# Patient Record
Sex: Male | Born: 1965 | Race: White | Hispanic: No | Smoking: Current every day smoker
Health system: Southern US, Community
[De-identification: ages and names within clinical notes are randomized; demographics above are authoritative.]

## PROBLEM LIST (undated history)

## (undated) DIAGNOSIS — I10 Essential (primary) hypertension: Secondary | ICD-10-CM

## (undated) DIAGNOSIS — F329 Major depressive disorder, single episode, unspecified: Secondary | ICD-10-CM

## (undated) DIAGNOSIS — I4891 Unspecified atrial fibrillation: Secondary | ICD-10-CM

## (undated) DIAGNOSIS — F32A Depression, unspecified: Secondary | ICD-10-CM

## (undated) HISTORY — DX: Essential (primary) hypertension: I10

## (undated) HISTORY — DX: Depression, unspecified: F32.A

## (undated) HISTORY — DX: Major depressive disorder, single episode, unspecified: F32.9

---

## 2004-06-06 ENCOUNTER — Emergency Department: Payer: Self-pay | Admitting: Emergency Medicine

## 2009-11-20 ENCOUNTER — Emergency Department: Payer: Self-pay | Admitting: Emergency Medicine

## 2012-12-22 ENCOUNTER — Ambulatory Visit: Payer: Self-pay | Admitting: Gastroenterology

## 2012-12-25 LAB — PATHOLOGY REPORT

## 2014-08-08 ENCOUNTER — Ambulatory Visit: Payer: Self-pay | Admitting: Family Medicine

## 2015-05-16 ENCOUNTER — Ambulatory Visit (INDEPENDENT_AMBULATORY_CARE_PROVIDER_SITE_OTHER): Payer: Self-pay | Admitting: Family Medicine

## 2015-05-16 ENCOUNTER — Encounter: Payer: Self-pay | Admitting: Family Medicine

## 2015-05-16 VITALS — BP 125/80 | HR 69 | Temp 98.2°F | Ht 68.7 in | Wt 240.8 lb

## 2015-05-16 DIAGNOSIS — R1032 Left lower quadrant pain: Secondary | ICD-10-CM | POA: Insufficient documentation

## 2015-05-16 DIAGNOSIS — R002 Palpitations: Secondary | ICD-10-CM | POA: Insufficient documentation

## 2015-05-16 DIAGNOSIS — F4001 Agoraphobia with panic disorder: Secondary | ICD-10-CM | POA: Insufficient documentation

## 2015-05-16 MED ORDER — LOSARTAN POTASSIUM-HCTZ 50-12.5 MG PO TABS: 1.0000 | ORAL_TABLET | Freq: Every day | ORAL | Status: DC

## 2015-05-16 MED ORDER — SERTRALINE HCL 50 MG PO TABS: 50.0000 mg | ORAL_TABLET | Freq: Every day | ORAL | Status: DC

## 2015-05-16 NOTE — Patient Instructions (Addendum)
We'll get records from Dr. Lady Gary and Dr. Burnett Sheng (everything related to your heart) We'll get labs today If you have not heard anything from my staff in a week about any orders/referrals/studies from today, please contact us here to follow-up [phone]  Consider calling [facility] to see if you qualify [facility] Address: [address], [place], [place] [address]  Phone: [phone]  Start the new sertraline Call me with any problems Return in 4 weeks (or see someone in 4 weeks for follow-up on mood) Do seek help if needed You may be having PACs or PVCs if you want to read about it Try to quit smoking Avoid caffeine and tea (theophylline) and chocolate   Palpitations A palpitation is the feeling that your heartbeat is irregular or is faster than normal. It may feel like your heart is fluttering or skipping a beat. Palpitations are usually not a serious problem. However, in some cases, you may need further medical evaluation. CAUSES  Palpitations can be caused by:  Smoking.  Caffeine or other stimulants, such as diet pills or energy drinks.  Alcohol.  Stress and anxiety.  Strenuous physical activity.  Fatigue.  Certain medicines.  Heart disease, especially if you have a history of irregular heart rhythms (arrhythmias), such as atrial fibrillation, atrial flutter, or supraventricular tachycardia.  An improperly working pacemaker or defibrillator. DIAGNOSIS  To find the cause of your palpitations, your health care provider will take your medical history and perform a physical exam. Your health care provider may also have you take a test called an ambulatory electrocardiogram (ECG). An ECG records your heartbeat patterns over a 24-hour period. You may also have other tests, such as:  Transthoracic echocardiogram (TTE). During echocardiography, sound waves are used to evaluate how blood flows through your heart.  Transesophageal echocardiogram (TEE).  Cardiac  monitoring. This allows your health care provider to monitor your heart rate and rhythm in real time.  Holter monitor. This is a portable device that records your heartbeat and can help diagnose heart arrhythmias. It allows your health care provider to track your heart activity for several days, if needed.  Stress tests by exercise or by giving medicine that makes the heart beat faster. TREATMENT  Treatment of palpitations depends on the cause of your symptoms and can vary greatly. Most cases of palpitations do not require any treatment other than time, relaxation, and monitoring your symptoms. Other causes, such as atrial fibrillation, atrial flutter, or supraventricular tachycardia, usually require further treatment. HOME CARE INSTRUCTIONS   Avoid:  Caffeinated coffee, tea, soft drinks, diet pills, and energy drinks.  Chocolate.  Alcohol.  Stop smoking if you smoke.  Reduce your stress and anxiety. Things that can help you relax include:  A method of controlling things in your body, such as your heartbeats, with your mind (biofeedback).  Yoga.  Meditation.  Physical activity such as swimming, jogging, or walking.  Get plenty of rest and sleep. SEEK MEDICAL CARE IF:   You continue to have a fast or irregular heartbeat beyond 24 hours.  Your palpitations occur more often. SEEK IMMEDIATE MEDICAL CARE IF:  You have chest pain or shortness of breath.  You have a severe headache.  You feel dizzy or you faint. MAKE SURE YOU:  Understand these instructions.  Will watch your condition.  Will get help right away if you are not doing well or get worse. Document Released: 08/13/2000 Document Revised: 08/21/2013 Document Reviewed: 10/15/2011 Exit[employer] Patient Information 2015 [employer]. This information is not intended to replace advice given to you by your health care provider. Make sure you discuss any questions you have with your health care provider. Panic  Attacks Panic attacks are sudden, short-livedsurges of severe anxiety, fear, or discomfort. They may occur for no reason when you are relaxed, when you are anxious, or when you are sleeping. Panic attacks may occur for a number of reasons:   Healthy people occasionally have panic attacks in extreme, life-threatening situations, such as war or natural disasters. Normal anxiety is a protective mechanism of the body that helps [place] react to danger (fight or flight response).  Panic attacks are often seen with anxiety disorders, such as panic disorder, social anxiety disorder, generalized anxiety disorder, and phobias. Anxiety disorders cause excessive or uncontrollable anxiety. They may interfere with your relationships or other life activities.  Panic attacks are sometimes seen with other mental illnesses, such as depression and posttraumatic stress disorder.  Certain medical conditions, prescription medicines, and drugs of abuse can cause panic attacks. SYMPTOMS  Panic attacks start suddenly, peak within 20 minutes, and are accompanied by four or more of the following symptoms:  Pounding heart or fast heart rate (palpitations).  Sweating.  Trembling or shaking.  Shortness of breath or feeling smothered.  Feeling choked.  Chest pain or discomfort.  Nausea or strange feeling in your stomach.  Dizziness, light-headedness, or feeling like you will faint.  Chills or hot flushes.  Numbness or tingling in your lips or hands and feet.  Feeling that things are not real or feeling that you are not yourself.  Fear of losing control or going crazy.  Fear of dying. Some of these symptoms can mimic serious medical conditions. For example, you may think you are having a heart attack. Although panic attacks can be very scary, they are not life threatening. DIAGNOSIS  Panic attacks are diagnosed through an assessment by your health care provider. Your health care provider will ask questions  about your symptoms, such as where and when they occurred. Your health care provider will also ask about your medical history and use of alcohol and drugs, including prescription medicines. Your health care provider may order blood tests or other studies to rule out a serious medical condition. Your health care provider may refer you to a mental health professional for further evaluation. TREATMENT   Most healthy people who have one or two panic attacks in an extreme, life-threatening situation will not require treatment.  The treatment for panic attacks associated with anxiety disorders or other mental illness typically involves counseling with a mental health professional, medicine, or a combination of both. Your health care provider will help determine what treatment is best for you.  Panic attacks due to physical illness usually go away with treatment of the illness. If prescription medicine is causing panic attacks, talk with your health care provider about stopping the medicine, decreasing the dose, or substituting another medicine.  Panic attacks due to alcohol or drug abuse go away with abstinence. Some adults need professional help in order to stop drinking or using drugs. HOME CARE INSTRUCTIONS   Take all medicines as directed by your health care provider.   Schedule and attend follow-up visits as directed by your health care provider. It is important to keep all your appointments. SEEK MEDICAL CARE IF:  You are not able to take your medicines as prescribed.  Your symptoms do not improve or get worse. SEEK IMMEDIATE MEDICAL CARE IF:  You experience panic attack symptoms that are different than your usual symptoms.  You have serious thoughts about hurting yourself or others.  You are taking medicine for panic attacks and have a serious side effect. MAKE SURE YOU:  Understand these instructions.  Will watch your condition.  Will get help right away if you are not doing well  or get worse. Document Released: 08/16/2005 Document Revised: 08/21/2013 Document Reviewed: 03/30/2013 [facility] Patient Information 2015 [place], [place]. This information is not intended to replace advice given to you by your health care provider. Make sure you discuss any questions you have with your health care provider.
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
## 2015-05-17 LAB — COMPREHENSIVE METABOLIC PANEL
ALBUMIN: 4 g/dL (ref 3.5–5.5)
ALT: 45 IU/L — ABNORMAL HIGH (ref 0–44)
AST: 24 IU/L (ref 0–40)
Albumin/Globulin Ratio: 1.7 (ref 1.1–2.5)
Alkaline Phosphatase: 87 IU/L (ref 39–117)
BUN / CREAT RATIO: 14 (ref 9–20)
BUN: 16 mg/dL (ref 6–24)
CHLORIDE: 103 mmol/L (ref 97–108)
CO2: 25 mmol/L (ref 18–29)
Calcium: 9.9 mg/dL (ref 8.7–10.2)
Creatinine, Ser: 1.12 mg/dL (ref 0.76–1.27)
GFR calc non Af Amer: 77 mL/min/{1.73_m2} (ref >59)
GFR, EST AFRICAN AMERICAN: 89 mL/min/{1.73_m2} (ref >59)
GLOBULIN, TOTAL: 2.4 g/dL (ref 1.5–4.5)
Glucose: 82 mg/dL (ref 65–99)
Potassium: 4.1 mmol/L (ref 3.5–5.2)
SODIUM: 143 mmol/L (ref 134–144)
TOTAL PROTEIN: 6.4 g/dL (ref 6.0–8.5)

## 2015-05-17 LAB — MAGNESIUM: Magnesium: 1.9 mg/dL (ref 1.6–2.3)

## 2015-05-17 LAB — TSH: TSH: 1.32 u[IU]/mL (ref 0.450–4.500)

## 2015-05-19 ENCOUNTER — Encounter: Payer: Self-pay | Admitting: Family Medicine

## 2015-05-21 NOTE — Assessment & Plan Note (Signed)
Check TSH to r/o hyperthyroidism; want to also r/o carcinoid, but patient will check first about getting insurance or other coverage before additional tests are run; start medication, follow-up in 3-4 weeks, but call sooner or come in if needed; he is not interested in counseling

## 2015-05-21 NOTE — Assessment & Plan Note (Signed)
Request records from cardiology work-up; it sounds as though he has had extensive testing already including holter monitor, echo, ekg; I would like to suggest a low-dose beta-blocker but want cardiology notes first; patient to call as default in case notes don't arrive quickly; check K+ and Mg2+; stop smoking; limit caffeine

## 2015-05-21 NOTE — Assessment & Plan Note (Signed)
Suggested getting a CT scan, but patient does not have insurance; ddx includes (rare possibility) of carcinoid tumor; he will check into insurance or other coverage options, but I do think he needs this looked into

## 2015-06-13 ENCOUNTER — Other Ambulatory Visit: Payer: Self-pay

## 2015-06-13 ENCOUNTER — Encounter: Payer: Self-pay | Admitting: Family Medicine

## 2015-06-13 ENCOUNTER — Ambulatory Visit (INDEPENDENT_AMBULATORY_CARE_PROVIDER_SITE_OTHER): Payer: Self-pay | Admitting: Family Medicine

## 2015-06-13 VITALS — BP 121/85 | HR 68 | Temp 97.0°F | Wt 244.0 lb

## 2015-06-13 DIAGNOSIS — H66001 Acute suppurative otitis media without spontaneous rupture of ear drum, right ear: Secondary | ICD-10-CM

## 2015-06-13 DIAGNOSIS — F439 Reaction to severe stress, unspecified: Secondary | ICD-10-CM | POA: Insufficient documentation

## 2015-06-13 DIAGNOSIS — R002 Palpitations: Secondary | ICD-10-CM

## 2015-06-13 DIAGNOSIS — Z638 Other specified problems related to primary support group: Secondary | ICD-10-CM

## 2015-06-13 DIAGNOSIS — F4001 Agoraphobia with panic disorder: Secondary | ICD-10-CM

## 2015-06-13 DIAGNOSIS — Z720 Tobacco use: Secondary | ICD-10-CM | POA: Insufficient documentation

## 2015-06-13 DIAGNOSIS — H6691 Otitis media, unspecified, right ear: Secondary | ICD-10-CM | POA: Insufficient documentation

## 2015-06-13 MED ORDER — SERTRALINE HCL 50 MG PO TABS: ORAL_TABLET | ORAL | Status: DC

## 2015-06-13 MED ORDER — DOXYCYCLINE HYCLATE 100 MG PO TABS: 100.0000 mg | ORAL_TABLET | Freq: Two times a day (BID) | ORAL | Status: DC

## 2015-06-13 MED ORDER — LOSARTAN POTASSIUM-HCTZ 50-12.5 MG PO TABS: 1.0000 | ORAL_TABLET | Freq: Every day | ORAL | Status: DC

## 2015-06-13 NOTE — Assessment & Plan Note (Signed)
He is doing better with the sertraline, and we mutually opted to increase the SSRI to 75 mg daily for 6 days and then up to 100 mg daily; call me in 4 weeks with an update; cautioned about possibility of swinging into hypomanic or manic stage, so please contact me right away, even if feeling great
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
## 2015-06-13 NOTE — Telephone Encounter (Signed)
Routing to provider  

## 2015-06-13 NOTE — Assessment & Plan Note (Signed)
Encouraged cessation; he is not ready to quit right now

## 2015-06-13 NOTE — Patient Instructions (Addendum)
Please do contact vocational rehabilitation here in [place], [place] Address: [address] [place], [place] [address]  Phone: [phone]  GINA--> please check on records that were requested from Dr. Lady Gary and Dr. Burnett Sheng; they are not in the system (?); please sign new releases to get those records, especially cardiac work-up  I am here to help you quit smoking if and when the day comes  Consider calling 1-800-QUIT-NOW for help if desired  Increase the sertraline from 50 mg to 75 mg daily for 6 days, then go up to 100 mg daily Call me with an update in 4 weeks on your mood and we'll send in refills  Smoking Cessation, Tips for Success If you are ready to quit smoking, congratulations! You have chosen to help yourself be healthier. Cigarettes bring nicotine, tar, carbon monoxide, and other irritants into your body. Your lungs, heart, and blood vessels will be able to work better without these poisons. There are many different ways to quit smoking. Nicotine gum, nicotine patches, a nicotine inhaler, or nicotine nasal spray can help with physical craving. Hypnosis, support groups, and medicines help break the habit of smoking. WHAT THINGS CAN I DO TO MAKE QUITTING EASIER?  Here are some tips to help you quit for good:  Pick a date when you will quit smoking completely. Tell all of your friends and family about your plan to quit on that date.  Do not try to slowly cut down on the number of cigarettes you are smoking. Pick a quit date and quit smoking completely starting on that day.  Throw away all cigarettes.   Clean and remove all ashtrays from your home, work, and car.  On a card, write down your reasons for quitting. Carry the card with you and read it when you get the urge to smoke.  Cleanse your body of nicotine. Drink enough water and fluids to keep your urine clear or pale yellow. Do this after quitting to flush the nicotine from your body.  Learn to predict your moods. Do not let  a bad situation be your excuse to have a cigarette. Some situations in your life might tempt you into wanting a cigarette.  Never have "just one" cigarette. It leads to wanting another and another. Remind yourself of your decision to quit.  Change habits associated with smoking. If you smoked while driving or when feeling stressed, try other activities to replace smoking. Stand up when drinking your coffee. Brush your teeth after eating. Sit in a different chair when you read the paper. Avoid alcohol while trying to quit, and try to drink fewer caffeinated beverages. Alcohol and caffeine may urge you to smoke.  Avoid foods and drinks that can trigger a desire to smoke, such as sugary or spicy foods and alcohol.  Ask people who smoke not to smoke around you.  Have something planned to do right after eating or having a cup of coffee. For example, plan to take a walk or exercise.  Try a relaxation exercise to calm you down and decrease your stress. Remember, you may be tense and nervous for the first 2 weeks after you quit, but this will pass.  Find new activities to keep your hands busy. Play with a pen, coin, or rubber band. Doodle or draw things on paper.  Brush your teeth right after eating. This will help cut down on the craving for the taste of tobacco after meals. You can also try mouthwash.   Use oral substitutes in  place of cigarettes. Try using lemon drops, carrots, cinnamon sticks, or chewing gum. Keep them handy so they are available when you have the urge to smoke.  When you have the urge to smoke, try deep breathing.  Designate your home as a nonsmoking area.  If you are a heavy smoker, ask your health care provider about a prescription for nicotine chewing gum. It can ease your withdrawal from nicotine.  Reward yourself. Set aside the cigarette money you save and buy yourself something nice.  Look for support from others. Join a support group or smoking cessation program. Ask  someone at home or at work to help you with your plan to quit smoking.  Always ask yourself, "Do I need this cigarette or is this just a reflex?" Tell yourself, "Today, I choose not to smoke," or "I do not want to smoke." You are reminding yourself of your decision to quit.  Do not replace cigarette smoking with electronic cigarettes (commonly called e-cigarettes). The safety of e-cigarettes is unknown, and some may contain harmful chemicals.  If you relapse, do not give up! Plan ahead and think about what you will do the next time you get the urge to smoke. HOW WILL I FEEL WHEN I QUIT SMOKING? You may have symptoms of withdrawal because your body is used to nicotine (the addictive substance in cigarettes). You may crave cigarettes, be irritable, feel very hungry, cough often, get headaches, or have difficulty concentrating. The withdrawal symptoms are only temporary. They are strongest when you first quit but will go away within 10-14 days. When withdrawal symptoms occur, stay in control. Think about your reasons for quitting. Remind yourself that these are signs that your body is healing and getting used to being without cigarettes. Remember that withdrawal symptoms are easier to treat than the major diseases that smoking can cause.  Even after the withdrawal is over, expect periodic urges to smoke. However, these cravings are generally short lived and will go away whether you smoke or not. Do not smoke! WHAT RESOURCES ARE AVAILABLE TO HELP ME QUIT SMOKING? Your health care provider can direct you to community resources or hospitals for support, which may include:  Group support.  Education.  Hypnosis.  Therapy.   This information is not intended to replace advice given to you by your health care provider. Make sure you discuss any questions you have with your health care provider.   Document Released: 05/14/2004 Document Revised: 09/06/2014 Document Reviewed: 02/01/2013 Elsevier  Interactive Patient Education [employer]2016 Elsevier Inc.

## 2015-06-13 NOTE — Assessment & Plan Note (Addendum)
Will request work-up from cardiologist, as well as records from his previous primary; I looked in Epic but cannot find anything, and there is no "Care Everywhere" button; he does not appear to have a duplciate chart; will request records on paper; I reviewed his last labs, including normal Mg2+ 1.9, normal TSH 1.32, normal K+ 4.1

## 2015-06-13 NOTE — Assessment & Plan Note (Addendum)
We discussed his girlfriend's alcoholism; encouraged him to start Al-Anon, at least look into that; we also talked about his job situation, and i encouraged him to contact voc rehab; I can write a letter if needed

## 2015-06-13 NOTE — Assessment & Plan Note (Signed)
Likely a secondary bacterial infection following a viral URI; start antibiotics

## 2015-06-13 NOTE — Telephone Encounter (Signed)
Last creatinine and K+ reviewed; Rx approved 

## 2015-06-24 ENCOUNTER — Telehealth: Payer: Self-pay

## 2015-06-24 ENCOUNTER — Other Ambulatory Visit: Payer: Self-pay

## 2015-06-24 DIAGNOSIS — D582 Other hemoglobinopathies: Secondary | ICD-10-CM

## 2015-06-24 MED ORDER — AMOXICILLIN-POT CLAVULANATE 875-125 MG PO TABS: 1.0000 | ORAL_TABLET | Freq: Two times a day (BID) | ORAL | Status: DC

## 2015-06-24 MED ORDER — SERTRALINE HCL 100 MG PO TABS: 100.0000 mg | ORAL_TABLET | Freq: Every day | ORAL | Status: DC

## 2015-06-24 NOTE — Telephone Encounter (Signed)
He needs a refill on Sertraline. Also his right ear still hasn't cleared up yet, wants to know what else he can do or take.

## 2015-06-24 NOTE — Telephone Encounter (Signed)
Dr. Sherie DonLada reviewed his cardiology notes. The only thing was his H/H was elevated which could be a sign of OSA. Advised patient to come in for CBC recheck. He agrees.

## 2015-06-24 NOTE — Telephone Encounter (Signed)
I sent new Rx for 100 mg sertraline pills (he'll have tapered up by now, so I sent the higher strength) Let him know I sent in a new Rx too for another antibiotic; let us know if not completely resolved after this round

## 2015-06-25 NOTE — Telephone Encounter (Signed)
Left message to call.

## 2015-06-25 NOTE — Telephone Encounter (Signed)
Patient notified

## 2015-12-12 ENCOUNTER — Ambulatory Visit: Payer: Self-pay | Admitting: Family Medicine

## 2016-07-04 ENCOUNTER — Emergency Department
Admission: EM | Admit: 2016-07-04 | Discharge: 2016-07-04 | Disposition: A | Discharge status: Home / Self Care | Payer: Self-pay | Attending: Emergency Medicine | Admitting: Emergency Medicine

## 2016-07-04 ENCOUNTER — Emergency Department: Payer: Self-pay

## 2016-07-04 ENCOUNTER — Encounter: Payer: Self-pay | Admitting: *Deleted

## 2016-07-04 DIAGNOSIS — R002 Palpitations: Secondary | ICD-10-CM

## 2016-07-04 DIAGNOSIS — I491 Atrial premature depolarization: Secondary | ICD-10-CM | POA: Insufficient documentation

## 2016-07-04 DIAGNOSIS — Z79899 Other long term (current) drug therapy: Secondary | ICD-10-CM | POA: Insufficient documentation

## 2016-07-04 DIAGNOSIS — F17210 Nicotine dependence, cigarettes, uncomplicated: Secondary | ICD-10-CM | POA: Insufficient documentation

## 2016-07-04 DIAGNOSIS — I10 Essential (primary) hypertension: Secondary | ICD-10-CM | POA: Insufficient documentation

## 2016-07-04 LAB — COMPREHENSIVE METABOLIC PANEL
ALK PHOS: 79 U/L (ref 38–126)
ALT: 23 U/L (ref 17–63)
AST: 23 U/L (ref 15–41)
Albumin: 4.1 g/dL (ref 3.5–5.0)
Anion gap: 8 (ref 5–15)
BILIRUBIN TOTAL: 0.6 mg/dL (ref 0.3–1.2)
BUN: 12 mg/dL (ref 6–20)
CALCIUM: 8.9 mg/dL (ref 8.9–10.3)
CO2: 25 mmol/L (ref 22–32)
CREATININE: 1.14 mg/dL (ref 0.61–1.24)
Chloride: 105 mmol/L (ref 101–111)
GFR calc Af Amer: >60 mL/min (ref >60)
GLUCOSE: 111 mg/dL — AB (ref 65–99)
POTASSIUM: 4 mmol/L (ref 3.5–5.1)
Sodium: 138 mmol/L (ref 135–145)
TOTAL PROTEIN: 7.2 g/dL (ref 6.5–8.1)

## 2016-07-04 LAB — CBC
HEMATOCRIT: 53.9 % — AB (ref 40.0–52.0)
HEMOGLOBIN: 18.4 g/dL — AB (ref 13.0–18.0)
MCH: 31.4 pg (ref 26.0–34.0)
MCHC: 34.1 g/dL (ref 32.0–36.0)
MCV: 92 fL (ref 80.0–100.0)
Platelets: 185 10*3/uL (ref 150–440)
RBC: 5.86 MIL/uL (ref 4.40–5.90)
RDW: 13.6 % (ref 11.5–14.5)
WBC: 10.6 10*3/uL (ref 3.8–10.6)

## 2016-07-04 LAB — LIPASE, BLOOD: Lipase: 70 U/L — ABNORMAL HIGH (ref 11–51)

## 2016-07-04 LAB — TROPONIN I

## 2016-07-04 MED ORDER — METOPROLOL TARTRATE 25 MG PO TABS: 25.0000 mg | ORAL_TABLET | Freq: Two times a day (BID) | ORAL | 2 refills | Status: DC

## 2016-07-04 NOTE — ED Provider Notes (Signed)
[facility] Emergency Department Provider Note  Time seen: 10:08 AM  I have reviewed the triage vital signs and the nursing notes.   HISTORY  Chief Complaint Palpitations    HPI Brady Edwards is a 50 y.o. male with a past medical history of hypertension, off medications, who presents to the emergency Department for heart palpitations. According to the patient over the past 8 years he has been experiencing intermittent heart palpitations. States he saw a cardiologist in the past, and had a stress test and Holter monitor performed.Patient states they could never Sure the palpitations. He states over the past 1 week of palpitations have increased. States he cannot work on Thursday because of the significant palpitations he was experiencing. States they started again this morning so he came to the emergency department for evaluation. Patient denies any chest pain. Denies any shortness of breath nausea or diaphoresis. States the palpitations are concerning to him and distracting to him. States he has limited his caffeine use but still occasionally has palpitations after drinking caffeinated beverages.  Past Medical History:  Diagnosis Date  . Depression   . Hypertension     Patient Active Problem List   Diagnosis Date Noted  . Tobacco abuse 06/13/2015  . Otitis media, right 06/13/2015  . Stress at home 06/13/2015  . Palpitations 05/16/2015  . 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 Abdominal pain, left lower quadrant 05/16/2015    History reviewed. No pertinent surgical history.  Prior to Admission medications   Medication Sig Start Date End Date Taking? Authorizing Provider  amoxicillin-clavulanate (AUGMENTIN) 875-125 MG tablet Take 1 tablet by mouth 2 (two) times daily. Take probiotics or eat yogurt daily x 2 weeks 06/24/15   Kerman PasseyMelinda P Lada, MD  losartan-hydrochlorothiazide (HYZAAR) 50-12.5 MG tablet Take 1 tablet by mouth daily. 06/13/15   Kerman PasseyMelinda P  Lada, MD  sertraline (ZOLOFT) 100 MG tablet Take 1 tablet (100 mg total) by mouth daily. 06/24/15   Kerman PasseyMelinda P Lada, MD    No Known Allergies  Family History  Problem Relation Age of Onset  . Cancer Mother     uterine, ovarian, and breast  . Heart disease Father     MI  . Diabetes Brother   . Cancer Maternal Grandmother     skin  . Cancer Maternal Grandfather     skin  . Cancer Paternal Grandmother     Social History Social History  Substance Use Topics  . Smoking status: Current Every Day Smoker    Packs/day: 1.00    Types: Cigarettes  . Smokeless tobacco: Never Used  . Alcohol use 0.0 oz/week     Comment: on occasion    Review of Systems Constitutional: Negative for fever. Cardiovascular: Negative for chest pain.Positive for palpitations. Respiratory: Negative for shortness of breath. Gastrointestinal: Negative for abdominal pain Neurological: Negative for headache 10-point ROS otherwise negative.  ____________________________________________   PHYSICAL EXAM:  VITAL SIGNS: ED Triage Vitals  Enc Vitals Group     BP 07/04/16 0943 (!) 148/102     Pulse Rate 07/04/16 0943 76     Resp 07/04/16 0943 20     Temp 07/04/16 0943 98.3 F (36.8 C)     Temp Source 07/04/16 0943 Oral     SpO2 07/04/16 0943 98 %     Weight 07/04/16 0942 247 lb (112 kg)     Height 07/04/16 0942 5\' 8"  (1.727 m)     Head Circumference --  Peak Flow --      Pain Score --      Pain Loc --      Pain Edu? --      Excl. in GC? --     Constitutional: Alert and oriented. Well appearing and in no distress. Eyes: Normal exam ENT   Head: Normocephalic and atraumatic.   Mouth/Throat: Mucous membranes are moist. Cardiovascular: Normal rate, regular rhythm. No murmur Respiratory: Normal respiratory effort without tachypnea nor retractions. Breath sounds are clear  Gastrointestinal: Soft and nontender. No distention.   Musculoskeletal: Nontender with normal range of motion in all  extremities.  Neurologic:  Normal speech and language. No gross focal neurologic deficits  Skin:  Skin is warm, dry and intact.  Psychiatric: Mood and affect are normal.   ____________________________________________    EKG  EKG reviewed and interpreted, such as normal sinus rhythm at 75 bpm, narrow QS, normal axis, normal intervals, no ST changes, overall normal EKG.  ____________________________________________    RADIOLOGY  Chest x-ray negative  ____________________________________________   INITIAL IMPRESSION / ASSESSMENT AND PLAN / ED COURSE  Pertinent labs & imaging results that were available during my care of the patient were reviewed by me and considered in my medical decision making (see chart for details).  The patient presents the emergency department for chest palpitations occurring intermittently over the past 8 years, worse over the past 1 week. Patient states they've never been able to capture the palpitations on a heart monitor Holter monitor. Patient had 2 episodes of palpitations in the emergency department while on a cardiac monitor. The episodes appear to be 2-3 beat runs of PACs, with spontaneous resolution. His underlying rhythm appears to be normal sinus rhythm with no discernible abnormalities. We will check labs, patient is experiencing mild epigastric tenderness which she states has been present for years. We will obtain a chest x-ray. We will continue to monitor on telemetry in the emergency department. I suspect the patient will be discharged home with cartilage follow-up. Patient supposed to be on blood pressure medication but states his insurance ran out several months ago and he has not seen a doctor for a refill. Patient is mildly hypertensive in the emergency department, he may benefit from a beta blocker such as metoprolol to help with his hypertension as well as palpitations.  Chest x-ray negative. Labs are largely within normal limits, slight lipase  elevation. I discussed with patient need to follow up with a primary care doctor. He denies alcohol use. Patient is agreeable to follow-up. We will [facility] to [facility] as a patient states he does not currently have the finances to see a private practice doctor.  Patient has had several beats of PACs in the emergency department up to 3 and a row. We will start the patient on metoprolol, patient follow up with PCP.  ____________________________________________   FINAL CLINICAL IMPRESSION(S) / ED DIAGNOSES  Palpitations Premature atrial complexes   Minna AntisKevin Devine Dant, MD 07/04/16 1103

## 2016-07-04 NOTE — ED Triage Notes (Signed)
Pt reports palpitations since Thursday with hypertension, pt denies chest pain or any other symptoms

## 2016-07-04 NOTE — Discharge Instructions (Signed)
As we discussed please fill and begin taking your metoprolol medication. Please call the number provided to arrange a primary care physician appointment in the next 2 weeks for recheck/reevaluation. Return to the emergency department for any trouble breathing, any chest pain, dizziness or lightheadedness.

## 2016-07-04 NOTE — ED Notes (Signed)
ED Provider at bedside. 

## 2016-07-04 NOTE — ED Notes (Signed)
Patient ambulatory to Xray.

## 2016-07-04 NOTE — ED Notes (Signed)
Pt c/o intermittent "palpitation" in chest, worsening over past 3 days. Pt states that they are not constant and do not cause pain but makes him anxious because he does not know what it going on. Dizziness on occasion due to the palpitations. Pt alert and oriented X4, active, cooperative, pt in NAD. RR even and unlabored, color WNL.  Pt has had this go on intermittently X 8 years; has seen cardiology but has "never caught it" on stress test or EKG.

## 2017-11-26 ENCOUNTER — Encounter: Payer: Self-pay | Admitting: Emergency Medicine

## 2017-11-26 ENCOUNTER — Other Ambulatory Visit: Payer: Self-pay

## 2017-11-26 ENCOUNTER — Emergency Department: Payer: BLUE CROSS/BLUE SHIELD

## 2017-11-26 DIAGNOSIS — I10 Essential (primary) hypertension: Secondary | ICD-10-CM | POA: Insufficient documentation

## 2017-11-26 DIAGNOSIS — R079 Chest pain, unspecified: Secondary | ICD-10-CM | POA: Diagnosis not present

## 2017-11-26 DIAGNOSIS — R0789 Other chest pain: Secondary | ICD-10-CM | POA: Diagnosis not present

## 2017-11-26 DIAGNOSIS — F17210 Nicotine dependence, cigarettes, uncomplicated: Secondary | ICD-10-CM | POA: Diagnosis not present

## 2017-11-26 DIAGNOSIS — R002 Palpitations: Secondary | ICD-10-CM | POA: Diagnosis not present

## 2017-11-26 LAB — TROPONIN I: Troponin I: <0.03 ng/mL (ref <0.03)

## 2017-11-26 LAB — CBC
HEMATOCRIT: 51.4 % (ref 40.0–52.0)
HEMOGLOBIN: 17.2 g/dL (ref 13.0–18.0)
MCH: 30.7 pg (ref 26.0–34.0)
MCHC: 33.5 g/dL (ref 32.0–36.0)
MCV: 91.7 fL (ref 80.0–100.0)
Platelets: 235 10*3/uL (ref 150–440)
RBC: 5.61 MIL/uL (ref 4.40–5.90)
RDW: 13.9 % (ref 11.5–14.5)
WBC: 8.9 10*3/uL (ref 3.8–10.6)

## 2017-11-26 LAB — BASIC METABOLIC PANEL
Anion gap: 9 (ref 5–15)
BUN: 21 mg/dL — ABNORMAL HIGH (ref 6–20)
CHLORIDE: 108 mmol/L (ref 101–111)
CO2: 22 mmol/L (ref 22–32)
Calcium: 8.9 mg/dL (ref 8.9–10.3)
Creatinine, Ser: 1.16 mg/dL (ref 0.61–1.24)
GFR calc non Af Amer: >60 mL/min (ref >60)
Glucose, Bld: 108 mg/dL — ABNORMAL HIGH (ref 65–99)
Potassium: 3.9 mmol/L (ref 3.5–5.1)
SODIUM: 139 mmol/L (ref 135–145)

## 2017-11-26 NOTE — ED Triage Notes (Signed)
Pt reports intermittent palpitations for the last 60 minutes or so; currently rates 2/10; pt says he's been having these intermittent episodes for about 3 years; pt says he was diagnosed here 8 months ago with a-fib but never followed up with cardiology as he had no insurance; pt says last episode was this week and made him feel bad enough that he was scared to stay alone and stayed with a friend; pt reports shortness of breath worsening over the last few years with exertion; pt alert and oriented, talking in complete coherent sentences

## 2017-11-26 NOTE — ED Notes (Signed)
Patient observed ambulating around waiting room.  No acute distress noted.

## 2017-11-27 ENCOUNTER — Emergency Department
Admission: EM | Admit: 2017-11-27 | Discharge: 2017-11-27 | Disposition: A | Discharge status: Home / Self Care | Payer: BLUE CROSS/BLUE SHIELD | Attending: Emergency Medicine | Admitting: Emergency Medicine

## 2017-11-27 DIAGNOSIS — R002 Palpitations: Secondary | ICD-10-CM

## 2017-11-27 DIAGNOSIS — R079 Chest pain, unspecified: Secondary | ICD-10-CM

## 2017-11-27 HISTORY — DX: Unspecified atrial fibrillation: I48.91

## 2017-11-27 MED ORDER — METOPROLOL TARTRATE 25 MG PO TABS: 12.5000 mg | ORAL_TABLET | Freq: Two times a day (BID) | ORAL | 0 refills | Status: DC | PRN

## 2017-11-27 NOTE — ED Provider Notes (Signed)
[facility] Emergency Department Provider Note  ____________________________________________   First MD Initiated Contact with Patient 11/27/17 [phone]     (approximate)  I have reviewed the triage vital signs and the nursing notes.   HISTORY  Chief Complaint Chest Pain   HPI Brady Edwards is a 52 y.o. male with a history of palpitations and questionable atrial fibrillation was presenting to the emergency department today with palpitations.  He says the palpitations prior to arrival that lasted for about 1 hour.  He said that he also had intermittent chest pain to his left lower chest that was sharp and lasted several seconds.  He says that he had this pain several times but is pain-free at this time.  He has no complaints at this time.  Says that he has palpitations daily and gets very anxious when the palpitations occur.  Says that he was diagnosed with "atrial fibrillation" last time he visited here in November.  However, upon review of his chart he had PACs.  He denies being on any medications at this time.  However, he says that he was seen by cardiologist years ago, Dr. Lady GaryFath, who did monitoring as well as stress test and had all results come back negative.  Patient says that he now has insurance again and would like to have a workup and is able to afford his medications once again.  Has not been on any medications for about a year at this time.  Past Medical History:  Diagnosis Date  . Atrial fibrillation (HCC)   . Depression   . Hypertension     Patient Active Problem List   Diagnosis Date Noted  . Tobacco abuse 06/13/2015  . Otitis media, right 06/13/2015  . Stress at home 06/13/2015  . Palpitations 05/16/2015  . 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 Abdominal pain, left lower quadrant 05/16/2015    History reviewed. No pertinent surgical history.  Prior to Admission medications   Not on File    Allergies Patient has no known  allergies.  Family History  Problem Relation Age of Onset  . Cancer Mother        uterine, ovarian, and breast  . Heart disease Father        MI  . Diabetes Brother   . Cancer Maternal Grandmother        skin  . Cancer Maternal Grandfather        skin  . Cancer Paternal Grandmother     Social History Social History   Tobacco Use  . Smoking status: Current Every Day Smoker    Packs/day: 1.00    Types: Cigarettes  . Smokeless tobacco: Never Used  Substance Use Topics  . Alcohol use: Not Currently    Alcohol/week: 0.0 oz  . Drug use: No    Review of Systems  Constitutional: No fever/chills Eyes: No visual changes. ENT: No sore throat. Cardiovascular: As above Respiratory: Denies shortness of breath. Gastrointestinal: No abdominal pain.  No nausea, no vomiting.  No diarrhea.  No constipation. Genitourinary: Negative for dysuria. Musculoskeletal: Negative for back pain. Skin: Negative for rash. Neurological: Negative for headaches, focal weakness or numbness.   ____________________________________________   PHYSICAL EXAM:  VITAL SIGNS: ED Triage Vitals [11/26/17 2115]  Enc Vitals Group     BP (!) 162/94     Pulse Rate 71     Resp 18     Temp 98.4 F (36.9 C)     Temp Source Oral  SpO2 94 %     Weight 256 lb (116.1 kg)     Height 5\' 8"  (1.727 m)     Head Circumference      Peak Flow      Pain Score 2     Pain Loc      Pain Edu?      Excl. in GC?     Constitutional: Alert and oriented. Well appearing and in no acute distress. Eyes: Conjunctivae are normal.  Head: Atraumatic. Nose: No congestion/rhinnorhea. Mouth/Throat: Mucous membranes are moist.  Neck: No stridor.   Cardiovascular: Normal rate, regular rhythm. Grossly normal heart sounds.  Chest pain is not reproducible to palpation. Respiratory: Normal respiratory effort.  No retractions. Lungs CTAB. Gastrointestinal: Soft and nontender. No distention.  Musculoskeletal: No lower extremity  tenderness nor edema.  No joint effusions. Neurologic:  Normal speech and language. No gross focal neurologic deficits are appreciated. Skin:  Skin is warm, dry and intact. No rash noted. Psychiatric: Mood and affect are normal. Speech and behavior are normal.  ____________________________________________   LABS (all labs ordered are listed, but only abnormal results are displayed)  Labs Reviewed  BASIC METABOLIC PANEL - Abnormal; Notable for the following components:      Result Value   Glucose, Bld 108 (*)    BUN 21 (*)    All other components within normal limits  CBC  TROPONIN I   ____________________________________________  EKG  ED ECG REPORT I, Arelia Longest, the attending physician, personally viewed and interpreted this ECG.   Date: 11/27/2017  EKG Time: 2112  Rate: 77  Rhythm: normal sinus rhythm  Axis: Normal  Intervals:none  ST&T Change: No ST segment elevation or depression.  No abnormal T wave inversion.  ____________________________________________  RADIOLOGY  No acute finding ____________________________________________   PROCEDURES  Procedure(s) performed:   Procedures  Critical Care performed:   ____________________________________________   INITIAL IMPRESSION / ASSESSMENT AND PLAN / ED COURSE  Pertinent labs & imaging results that were available during my care of the patient were reviewed by me and considered in my medical decision making (see chart for details).  Differential diagnosis includes, but is not limited to, ACS, aortic dissection, pulmonary embolism, cardiac tamponade, pneumothorax, pneumonia, pericarditis, myocarditis, GI-related causes including esophagitis/gastritis, and musculoskeletal chest wall pain.   As part of my medical decision making, I reviewed the following data within the electronic MEDICAL RECORD NUMBER Notes from prior ED visits  I will place the patient on an as needed beta-blocker, twice daily.  I will do  this as needed because heart rate is about 58 in the room.  However, on the EKG was 77.  He will use this as needed for palpitations.  He will follow-up with Dr. Lady Gary.  He is understanding of this plan willing to comply.  Unlikely to be ACS.  Symptoms daily with very reassuring EKG as well as lab work. ____________________________________________   FINAL CLINICAL IMPRESSION(S) / ED DIAGNOSES  Palpitations.  Chest pain.   NEW MEDICATIONS STARTED DURING THIS VISIT:  New Prescriptions   No medications on file     Note:  This document was prepared using Dragon voice recognition software and may include unintentional dictation errors.     Myrna Blazer, MD 11/27/17 [phone]

## 2017-11-27 NOTE — ED Notes (Signed)
Patient resting quietly with eyes closed. No acute distress noted.

## 2017-12-13 ENCOUNTER — Ambulatory Visit (INDEPENDENT_AMBULATORY_CARE_PROVIDER_SITE_OTHER): Payer: BLUE CROSS/BLUE SHIELD | Admitting: Family Medicine

## 2017-12-13 ENCOUNTER — Encounter: Payer: Self-pay | Admitting: Family Medicine

## 2017-12-13 ENCOUNTER — Ambulatory Visit: Payer: Self-pay | Admitting: Family Medicine

## 2017-12-13 VITALS — BP 136/90 | HR 65 | Temp 97.7°F | Wt 249.2 lb

## 2017-12-13 DIAGNOSIS — R739 Hyperglycemia, unspecified: Secondary | ICD-10-CM | POA: Diagnosis not present

## 2017-12-13 DIAGNOSIS — Z1211 Encounter for screening for malignant neoplasm of colon: Secondary | ICD-10-CM

## 2017-12-13 DIAGNOSIS — Z13220 Encounter for screening for lipoid disorders: Secondary | ICD-10-CM

## 2017-12-13 DIAGNOSIS — Z125 Encounter for screening for malignant neoplasm of prostate: Secondary | ICD-10-CM | POA: Diagnosis not present

## 2017-12-13 DIAGNOSIS — R002 Palpitations: Secondary | ICD-10-CM | POA: Diagnosis not present

## 2017-12-13 DIAGNOSIS — F4001 Agoraphobia with panic disorder: Secondary | ICD-10-CM | POA: Diagnosis not present

## 2017-12-13 LAB — UA/M W/RFLX CULTURE, ROUTINE
Bilirubin, UA: NEGATIVE
Glucose, UA: NEGATIVE
KETONES UA: NEGATIVE
LEUKOCYTES UA: NEGATIVE
Nitrite, UA: NEGATIVE
PH UA: 6 (ref 5.0–7.5)
SPEC GRAV UA: 1.025 (ref 1.005–1.030)
UUROB: 0.2 mg/dL (ref 0.2–1.0)

## 2017-12-13 LAB — MICROSCOPIC EXAMINATION: BACTERIA UA: NONE SEEN

## 2017-12-13 MED ORDER — VENLAFAXINE HCL ER 75 MG PO CP24: 75.0000 mg | ORAL_CAPSULE | Freq: Every day | ORAL | 1 refills | Status: DC

## 2017-12-13 MED ORDER — BUSPIRONE HCL 5 MG PO TABS: 5.0000 mg | ORAL_TABLET | Freq: Three times a day (TID) | ORAL | 1 refills | Status: DC

## 2017-12-13 MED ORDER — METOPROLOL TARTRATE 25 MG PO TABS: 12.5000 mg | ORAL_TABLET | Freq: Two times a day (BID) | ORAL | 5 refills | Status: DC | PRN

## 2017-12-13 NOTE — Assessment & Plan Note (Signed)
Has had a work up "years ago" with Dr. Lady GaryFath. Continuing with palpitations. Very concerned about this. Will check labs to look for organic cause and get him back into cardiology. Referral generated today.

## 2017-12-13 NOTE — Patient Instructions (Signed)
Palpitations A palpitation is the feeling that your heartbeat is irregular or is faster than normal. It may feel like your heart is fluttering or skipping a beat. Palpitations are usually not a serious problem. They may be caused by many things, including smoking, caffeine, alcohol, stress, and certain medicines. Although most causes of palpitations are not serious, palpitations can be a sign of a serious medical problem. In some cases, you may need further medical evaluation. Follow these instructions at home: Pay attention to any changes in your symptoms. Take these actions to help with your condition:  Avoid the following: ? Caffeinated coffee, tea, soft drinks, diet pills, and energy drinks. ? Chocolate. ? Alcohol.  Do not use any tobacco products, such as cigarettes, chewing tobacco, and e-cigarettes. If you need help quitting, ask your health care provider.  Try to reduce your stress and anxiety. Things that can help you relax include: ? Yoga. ? Meditation. ? Physical activity, such as swimming, jogging, or walking. ? Biofeedback. This is a method that helps you learn to use your mind to control things in your body, such as your heartbeats.  Get plenty of rest and sleep.  Take over-the-counter and prescription medicines only as told by your health care provider.  Keep all follow-up visits as told by your health care provider. This is important.  Contact a health care provider if:  You continue to have a fast or irregular heartbeat after 24 hours.  Your palpitations occur more often. Get help right away if:  You have chest pain or shortness of breath.  You have a severe headache.  You feel dizzy or you faint. This information is not intended to replace advice given to you by your health care provider. Make sure you discuss any questions you have with your health care provider. Document Released: 08/13/2000 Document Revised: 01/19/2016 Document Reviewed: 05/01/2015 Elsevier  Interactive Patient Education  2018 [employer]   Living With Anxiety After being diagnosed with an anxiety disorder, you may be relieved to know why you have felt or behaved a certain way. It is natural to also feel overwhelmed about the treatment ahead and what it will mean for your life. With care and support, you can manage this condition and recover from it. How to cope with anxiety Dealing with stress Stress is your body's reaction to life changes and events, both good and bad. Stress can last just a few hours or it can be ongoing. Stress can play a major role in anxiety, so it is important to learn both how to cope with stress and how to think about it differently. Talk with your health care provider or a counselor to learn more about stress reduction. He or she may suggest some stress reduction techniques, such as:  Music therapy. This can include creating or listening to music that you enjoy and that inspires you.  Mindfulness-based meditation. This involves being aware of your normal breaths, rather than trying to control your breathing. It can be done while sitting or walking.  Centering prayer. This is a kind of meditation that involves focusing on a word, phrase, or sacred image that is meaningful to you and that brings you peace.  Deep breathing. To do this, expand your stomach and inhale slowly through your nose. Hold your breath for 3-5 seconds. Then exhale slowly, allowing your stomach muscles to relax.  Self-talk. This is a skill where you identify thought patterns that lead to anxiety reactions and correct those thoughts.  Muscle   relaxation. This involves tensing muscles then relaxing them.  Choose a stress reduction technique that fits your lifestyle and personality. Stress reduction techniques take time and practice. Set aside 5-15 minutes a day to do them. Therapists can offer training in these techniques. The training may be covered by some insurance plans. Other  things you can do to manage stress include:  Keeping a stress diary. This can help you learn what triggers your stress and ways to control your response.  Thinking about how you respond to certain situations. You may not be able to control everything, but you can control your reaction.  Making time for activities that help you relax, and not feeling guilty about spending your time in this way.  Therapy combined with coping and stress-reduction skills provides the best chance for successful treatment. Medicines Medicines can help ease symptoms. Medicines for anxiety include:  Anti-anxiety drugs.  Antidepressants.  Beta-blockers.  Medicines may be used as the main treatment for anxiety disorder, along with therapy, or if other treatments are not working. Medicines should be prescribed by a health care provider. Relationships Relationships can play a big part in helping you recover. Try to spend more time connecting with trusted friends and family members. Consider going to couples counseling, taking family education classes, or going to family therapy. Therapy can help you and others better understand the condition. How to recognize changes in your condition Everyone has a different response to treatment for anxiety. Recovery from anxiety happens when symptoms decrease and stop interfering with your daily activities at home or work. This may mean that you will start to:  Have better concentration and focus.  Sleep better.  Be less irritable.  Have more energy.  Have improved memory.  It is important to recognize when your condition is getting worse. Contact your health care provider if your symptoms interfere with home or work and you do not feel like your condition is improving. Where to find help and support: You can get help and support from these sources:  Self-help groups.  Online and community organizations.  A trusted spiritual leader.  Couples counseling.  Family  education classes.  Family therapy.  Follow these instructions at home:  Eat a healthy diet that includes plenty of vegetables, fruits, whole grains, low-fat dairy products, and lean protein. Do not eat a lot of foods that are high in solid fats, added sugars, or salt.  Exercise. Most adults should do the following: ? Exercise for at least 150 minutes each week. The exercise should increase your heart rate and make you sweat (moderate-intensity exercise). ? Strengthening exercises at least twice a week.  Cut down on caffeine, tobacco, alcohol, and other potentially harmful substances.  Get the right amount and quality of sleep. Most adults need 7-9 hours of sleep each night.  Make choices that simplify your life.  Take over-the-counter and prescription medicines only as told by your health care provider.  Avoid caffeine, alcohol, and certain over-the-counter cold medicines. These may make you feel worse. Ask your pharmacist which medicines to avoid.  Keep all follow-up visits as told by your health care provider. This is important. Questions to ask your health care provider  Would I benefit from therapy?  How often should I follow up with a health care provider?  How long do I need to take medicine?  Are there any long-term side effects of my medicine?  Are there any alternatives to taking medicine? Contact a health care provider if:  You   have a hard time staying focused or finishing daily tasks.  You spend many hours a day feeling worried about everyday life.  You become exhausted by worry.  You start to have headaches, feel tense, or have nausea.  You urinate more than normal.  You have diarrhea. Get help right away if:  You have a racing heart and shortness of breath.  You have thoughts of hurting yourself or others. If you ever feel like you may hurt yourself or others, or have thoughts about taking your own life, get help right away. You can go to your nearest  emergency department or call:  Your local emergency services (911 in the [place]  A suicide crisis helpline, such as the National Suicide Prevention Lifeline at 1-800-273-8255. This is open 24-hours a day.  Summary  Taking steps to deal with stress can help calm you.  Medicines cannot cure anxiety disorders, but they can help ease symptoms.  Family, friends, and partners can play a big part in helping you recover from an anxiety disorder. This information is not intended to replace advice given to you by your health care provider. Make sure you discuss any questions you have with your health care provider. Document Released: 08/10/2016 Document Revised: 08/10/2016 Document Reviewed: 08/10/2016 Elsevier Interactive Patient Education  2018 [employer]vier Inc.  

## 2017-12-13 NOTE — Assessment & Plan Note (Signed)
Not under good control. Off medicine. Did not like sertraline. Will start effexor and buspar and follow up in 2 weeks. Call with any concerns.

## 2017-12-13 NOTE — Progress Notes (Signed)
BP 136/90 (BP Location: Left Arm, Patient Position: Sitting, Cuff Size: Large)   Pulse 65   Temp 97.7 F (36.5 C) (Oral)   Wt 249 lb 4 oz (113.1 kg)   SpO2 97%   BMI 37.90 kg/m    Subjective:    Patient ID: Brady Edwards, male    DOB: 10-26-1965, 52 y.o.   MRN: [id number]  HPI: Brady Edwards is a 52 y.o. male who presents today for hospital follow up after being lost to follow up for almost 3 years due to issues with insurance   Chief Complaint  Patient presents with  . Hospitalization Follow-up    BP  . Palpitations    when this occurs patient feels confused or like he is somewhere else.   ER FOLLOW UP- went to ER on 11/27/17 with palpitations. Possibly has a history of a fib. Had some intermittent chest pain that had been going on with the palpitations Time since discharge: 17 days Hospital/facility: [facility] Diagnosis: Palpitations Procedures/tests: CXR-normal, EKG- normal, labs-normal Consultants: None New medications: metoprolol 12.5mg  BID PRN Discharge instructions:  Follow up with Dr. Lady Gary Status: better- has been trying to avoid taking the medicine, does find that it helps, hasn't resolved everything  PALPITATIONS- still getting pressure in his chest, feeling like he's not really there, unable to focus, feels like something wrong Duration: 4-5 yeas Symptom description: pressure, tightness, harder to breath, harder beats Duration of episode: minutes to hours Frequency: recurrent, happening almost every day.  Activity when event occurred: when he's exerting himself or with extra stress, after eating Related to exertion: yes Dyspnea: yes- tightness where he can't take as deep breath with it Chest pain: yes Syncope: no  Near-syncope: yes Anxiety/stress: yes Nausea/vomiting: no Diaphoresis: no Coronary artery disease: no Congestive heart failure: no Arrhythmia:no Thyroid disease: no Caffeine intake: none Status:  worse- more often, less severe Treatments  attempted: metoprolol  ANXIETY/STRESS Duration:worse Anxious mood: yes  Excessive worrying: yes Irritability: no  Sweating: yes Nausea: yes Palpitations:yes Hyperventilation: no Panic attacks: yes Agoraphobia: yes  Obscessions/compulsions: no Depressed mood: yes Depression screen [facility] 2/9 12/13/2017 06/13/2015 05/16/2015  Decreased Interest 3 2 3   Down, Depressed, Hopeless 3 1 3   PHQ - 2 Score 6 3 6   Altered sleeping 2 1 1   Tired, decreased energy 3 1 3   Change in appetite 3 0 3  Feeling bad or failure about yourself  2 1 3   Trouble concentrating 2 1 3   Moving slowly or fidgety/restless 0 0 3  Suicidal thoughts 0 0 0  PHQ-9 Score 18 7 22   Difficult doing work/chores - Not difficult at all Very difficult   GAD 7 : Generalized Anxiety Score 12/13/2017  Nervous, Anxious, on Edge 3  Control/stop worrying 3  Worry too much - different things 3  Trouble relaxing 3  Restless 2  Easily annoyed or irritable 1  Afraid - awful might happen 3  Total GAD 7 Score 18  Anxiety Difficulty Extremely difficult    Anhedonia: no Weight changes: no Insomnia: yes   Hypersomnia: no Fatigue/loss of energy: yes Feelings of worthlessness: yes Feelings of guilt: yes Impaired concentration/indecisiveness: yes Suicidal ideations: no  Crying spells: no Recent Stressors/Life Changes: yes   Job stress: yes   Relevant past medical, surgical, family and social history reviewed and updated as indicated. Interim medical history since our last visit reviewed. Allergies and medications reviewed and updated.  Review of Systems  Constitutional: Negative.   Respiratory: Positive for  chest tightness and shortness of breath. Negative for apnea, cough, choking, wheezing and stridor.   Cardiovascular: Positive for chest pain.  Gastrointestinal: Negative.   Neurological: Positive for dizziness, light-headedness and headaches. Negative for tremors, seizures, syncope, facial asymmetry, speech difficulty,  weakness and numbness.  Psychiatric/Behavioral: Positive for agitation, decreased concentration and dysphoric mood. Negative for behavioral problems, confusion, hallucinations, self-injury, sleep disturbance and suicidal ideas. The patient is nervous/anxious. The patient is not hyperactive.     Per HPI unless specifically indicated above     Objective:    BP 136/90 (BP Location: Left Arm, Patient Position: Sitting, Cuff Size: Large)   Pulse 65   Temp 97.7 F (36.5 C) (Oral)   Wt 249 lb 4 oz (113.1 kg)   SpO2 97%   BMI 37.90 kg/m   Wt Readings from Last 3 Encounters:  12/13/17 249 lb 4 oz (113.1 kg)  11/26/17 256 lb (116.1 kg)  07/04/16 247 lb (112 kg)    Physical Exam  Constitutional: He is oriented to person, place, and time. He appears well-developed and well-nourished. No distress.  HENT:  Head: Normocephalic and atraumatic.  Right Ear: Hearing normal.  Left Ear: Hearing normal.  Nose: Nose normal.  Eyes: Conjunctivae and lids are normal. Right eye exhibits no discharge. Left eye exhibits no discharge. No scleral icterus.  Cardiovascular: Normal rate, regular rhythm, normal heart sounds and intact distal pulses. Exam reveals no gallop and no friction rub.  No murmur heard. Pulmonary/Chest: Effort normal and breath sounds normal. No stridor. No respiratory distress. He has no wheezes. He has no rales. He exhibits no tenderness.  Musculoskeletal: Normal range of motion.  Neurological: He is alert and oriented to person, place, and time.  Skin: Skin is warm, dry and intact. Capillary refill takes less than 2 seconds. No rash noted. He is not diaphoretic. No erythema. No pallor.  Psychiatric: He has a normal mood and affect. His speech is normal and behavior is normal. Judgment and thought content normal. Cognition and memory are normal.  Nursing note and vitals reviewed.   Results for orders placed or performed during the hospital encounter of 11/27/17  Basic metabolic panel    Result Value Ref Range   Sodium 139 135 - 145 mmol/L   Potassium 3.9 3.5 - 5.1 mmol/L   Chloride 108 101 - 111 mmol/L   CO2 22 22 - 32 mmol/L   Glucose, Bld 108 (H) 65 - 99 mg/dL   BUN 21 (H) 6 - 20 mg/dL   Creatinine, Ser 7.821.16 0.61 - 1.24 mg/dL   Calcium 8.9 8.9 - 95.610.3 mg/dL   GFR calc non Af Amer >60 >60 mL/min   GFR calc Af Amer >60 >60 mL/min   Anion gap 9 5 - 15  CBC  Result Value Ref Range   WBC 8.9 3.8 - 10.6 K/uL   RBC 5.61 4.40 - 5.90 MIL/uL   Hemoglobin 17.2 13.0 - 18.0 g/dL   HCT 21.351.4 08.640.0 - 57.852.0 %   MCV 91.7 80.0 - 100.0 fL   MCH 30.7 26.0 - 34.0 pg   MCHC 33.5 32.0 - 36.0 g/dL   RDW 46.913.9 62.911.5 - 52.814.5 %   Platelets 235 150 - 440 K/uL  Troponin I  Result Value Ref Range   Troponin I <0.03 <0.03 ng/mL      Assessment & Plan:   Problem List Items Addressed This Visit      Other   Palpitations - Primary    Has had  a work up "years ago" with Dr. Lady Gary. Continuing with palpitations. Very concerned about this. Will check labs to look for organic cause and get him back into cardiology. Referral generated today.      Relevant Orders   Ambulatory referral to Cardiology   CBC with Differential/Platelet   Comprehensive metabolic panel   UA/M w/rflx Culture, Routine   Thyroid Panel With TSH   Hgb A1c w/o eAG   Panic disorder with agoraphobia    Not under good control. Off medicine. Did not like sertraline. Will start effexor and buspar and follow up in 2 weeks. Call with any concerns.       Relevant Medications   busPIRone (BUSPAR) 5 MG tablet   venlafaxine XR (EFFEXOR XR) 75 MG 24 hr capsule    Other Visit Diagnoses    Screening for colon cancer       Referral to GI made today.   Relevant Orders   Ambulatory referral to Gastroenterology   Screening for cholesterol level       Labs drawn today. Await results.    Relevant Orders   Lipid Panel w/o Chol/HDL Ratio   Screening for prostate cancer       Labs drawn today. Await results.    Relevant Orders   PSA    Hyperglycemia       Checking A1c today.   Relevant Orders   Hgb A1c w/o eAG       Follow up plan: Return 3-4 weeks, for follow up mood.

## 2017-12-14 ENCOUNTER — Telehealth: Payer: Self-pay | Admitting: Family Medicine

## 2017-12-14 LAB — COMPREHENSIVE METABOLIC PANEL
ALBUMIN: 4.2 g/dL (ref 3.5–5.5)
ALK PHOS: 90 IU/L (ref 39–117)
ALT: 23 IU/L (ref 0–44)
AST: 21 IU/L (ref 0–40)
Albumin/Globulin Ratio: 1.8 (ref 1.2–2.2)
BUN / CREAT RATIO: 12 (ref 9–20)
BUN: 13 mg/dL (ref 6–24)
Bilirubin Total: 0.2 mg/dL (ref 0.0–1.2)
CALCIUM: 9.3 mg/dL (ref 8.7–10.2)
CO2: 24 mmol/L (ref 20–29)
CREATININE: 1.12 mg/dL (ref 0.76–1.27)
Chloride: 103 mmol/L (ref 96–106)
GFR, EST AFRICAN AMERICAN: 87 mL/min/{1.73_m2} (ref >59)
GFR, EST NON AFRICAN AMERICAN: 76 mL/min/{1.73_m2} (ref >59)
GLOBULIN, TOTAL: 2.3 g/dL (ref 1.5–4.5)
GLUCOSE: 95 mg/dL (ref 65–99)
Potassium: 4.3 mmol/L (ref 3.5–5.2)
SODIUM: 142 mmol/L (ref 134–144)
TOTAL PROTEIN: 6.5 g/dL (ref 6.0–8.5)

## 2017-12-14 LAB — CBC WITH DIFFERENTIAL/PLATELET
BASOS ABS: 0 10*3/uL (ref 0.0–0.2)
Basos: 1 %
EOS (ABSOLUTE): 0.2 10*3/uL (ref 0.0–0.4)
EOS: 2 %
HEMATOCRIT: 53.3 % — AB (ref 37.5–51.0)
HEMOGLOBIN: 18.3 g/dL — AB (ref 13.0–17.7)
IMMATURE GRANS (ABS): 0 10*3/uL (ref 0.0–0.1)
Immature Granulocytes: 0 %
LYMPHS ABS: 1.9 10*3/uL (ref 0.7–3.1)
Lymphs: 25 %
MCH: 32 pg (ref 26.6–33.0)
MCHC: 34.3 g/dL (ref 31.5–35.7)
MCV: 93 fL (ref 79–97)
MONOCYTES: 9 %
Monocytes Absolute: 0.6 10*3/uL (ref 0.1–0.9)
NEUTROS ABS: 4.8 10*3/uL (ref 1.4–7.0)
Neutrophils: 63 %
Platelets: 232 10*3/uL (ref 150–379)
RBC: 5.71 x10E6/uL (ref 4.14–5.80)
RDW: 14.1 % (ref 12.3–15.4)
WBC: 7.5 10*3/uL (ref 3.4–10.8)

## 2017-12-14 LAB — LIPID PANEL W/O CHOL/HDL RATIO
CHOLESTEROL TOTAL: 235 mg/dL — AB (ref 100–199)
HDL: 37 mg/dL — ABNORMAL LOW (ref >39)
LDL CALC: 138 mg/dL — AB (ref 0–99)
Triglycerides: 298 mg/dL — ABNORMAL HIGH (ref 0–149)
VLDL CHOLESTEROL CAL: 60 mg/dL — AB (ref 5–40)

## 2017-12-14 LAB — THYROID PANEL WITH TSH
Free Thyroxine Index: 2 (ref 1.2–4.9)
T3 UPTAKE RATIO: 28 % (ref 24–39)
T4 TOTAL: 7.3 ug/dL (ref 4.5–12.0)
TSH: 1.82 u[IU]/mL (ref 0.450–4.500)

## 2017-12-14 LAB — HGB A1C W/O EAG: Hgb A1c MFr Bld: 5.6 % (ref 4.8–5.6)

## 2017-12-14 LAB — PSA: PROSTATE SPECIFIC AG, SERUM: 0.9 ng/mL (ref 0.0–4.0)

## 2017-12-14 NOTE — Telephone Encounter (Signed)
Patient notified

## 2017-12-14 NOTE — Telephone Encounter (Signed)
Please let him know that his labs came back normal except his cholesterol was quite high. I just want him to work on avoiding fatty and fried food and try to get some exercise and we'll recheck it in about 6 months. Everything else looked good.

## 2017-12-19 ENCOUNTER — Telehealth: Payer: Self-pay

## 2017-12-19 NOTE — Telephone Encounter (Signed)
Gastroenterology Pre-Procedure Review  Request Date: 01/26/2018 Requesting Physician: Dr. Allegra LaiVanga  PATIENT REVIEW QUESTIONS: The patient responded to the following health history questions as indicated:    1. Are you having any GI issues? No  2. Do you have a personal history of Polyps? No  3. Do you have a family history of Colon Cancer or Polyps? Yes, Mother polyps 4. Diabetes Mellitus? No  5. Joint replacements in the past 12 months? No  6. Major health problems in the past 3 months? No  7. Any artificial heart valves, MVP, or defibrillator? No     MEDICATIONS & ALLERGIES:    Patient reports the following regarding taking any anticoagulation/antiplatelet therapy:   Plavix, Coumadin, Eliquis, Xarelto, Lovenox, Pradaxa, Brilinta, or Effient? No  Aspirin? No   Patient confirms/reports the following medications:  Current Outpatient Medications  Medication Sig Dispense Refill  . metoprolol tartrate (LOPRESSOR) 25 MG tablet Take 0.5 tablets (12.5 mg total) by mouth 2 (two) times daily as needed (palpitations). 30 tablet 5  . venlafaxine XR (EFFEXOR XR) 75 MG 24 hr capsule Take 1 capsule (75 mg total) by mouth daily with breakfast. For 2 weeks, then increase to 2 capsules daily 60 capsule 1   No current facility-administered medications for this visit.     Patient confirms/reports the following allergies:  No Known Allergies  No orders of the defined types were placed in this encounter.   AUTHORIZATION INFORMATION Primary Insurance: 1D#: Group #:  Secondary Insurance: 1D#: Group #:  SCHEDULE INFORMATION: Date: 01/26/2018 Time: Location: [facility]

## 2017-12-22 ENCOUNTER — Other Ambulatory Visit: Payer: Self-pay

## 2017-12-22 ENCOUNTER — Encounter: Payer: Self-pay | Admitting: Gastroenterology

## 2017-12-22 DIAGNOSIS — Z1211 Encounter for screening for malignant neoplasm of colon: Secondary | ICD-10-CM

## 2018-01-04 DIAGNOSIS — R079 Chest pain, unspecified: Secondary | ICD-10-CM | POA: Diagnosis not present

## 2018-01-04 DIAGNOSIS — R002 Palpitations: Secondary | ICD-10-CM | POA: Diagnosis not present

## 2018-01-04 DIAGNOSIS — E782 Mixed hyperlipidemia: Secondary | ICD-10-CM | POA: Diagnosis not present

## 2018-01-04 DIAGNOSIS — I10 Essential (primary) hypertension: Secondary | ICD-10-CM | POA: Diagnosis not present

## 2018-01-05 ENCOUNTER — Ambulatory Visit (INDEPENDENT_AMBULATORY_CARE_PROVIDER_SITE_OTHER): Payer: BLUE CROSS/BLUE SHIELD | Admitting: Family Medicine

## 2018-01-05 ENCOUNTER — Encounter: Payer: Self-pay | Admitting: Family Medicine

## 2018-01-05 VITALS — BP 142/95 | HR 59 | Temp 98.3°F | Wt 241.4 lb

## 2018-01-05 DIAGNOSIS — F4001 Agoraphobia with panic disorder: Secondary | ICD-10-CM

## 2018-01-05 MED ORDER — VENLAFAXINE HCL ER 150 MG PO CP24: 150.0000 mg | ORAL_CAPSULE | Freq: Every day | ORAL | 1 refills | Status: DC

## 2018-01-05 NOTE — Progress Notes (Signed)
BP (!) 142/95   Pulse (!) 59   Temp 98.3 F (36.8 C) (Oral)   Wt 241 lb 6.4 oz (109.5 kg)   SpO2 97%   BMI 36.70 kg/m    Subjective:    Patient ID: [name], male    DOB: 09/05/1965, 52 y.o.   MRN: [id number]  HPI: LATRAVIOUS LEVITT is a 52 y.o. male  Chief Complaint  Patient presents with  . Depression   DEPRESSION- had some issues with appetite, back to normal now Mood status: better Satisfied with current treatment?: yes Symptom severity: moderate  Duration of current treatment : 1 month Side effects: no Medication compliance: good compliance Psychotherapy/counseling: no  Depressed mood: yes Anxious mood: yes Anhedonia: no Significant weight loss or gain: no Insomnia: no  Fatigue: yes Feelings of worthlessness or guilt: no Impaired concentration/indecisiveness: no Suicidal ideations: no Hopelessness: no Crying spells: no Depression screen [facility] 2/9 01/05/2018 12/13/2017 06/13/2015 05/16/2015  Decreased Interest Down, Depressed, Hopeless PHQ - 2 Score Altered sleeping Tired, decreased energy Change in appetite 1 3 0 3  Feeling bad or failure about yourself  0 Trouble concentrating Moving slowly or fidgety/restless 0 0 0 3  Suicidal thoughts 0 0 0 0  PHQ-9 Score Difficult doing work/chores - - Not difficult at all Very difficult   GAD 7 : Generalized Anxiety Score 01/05/2018 12/13/2017  Nervous, Anxious, on Edge 1 3  Control/stop worrying 2 3  Worry too much - different things 1 3  Trouble relaxing 1 3  Restless 0 2  Easily annoyed or irritable 1 1  Afraid - awful might happen 1 3  Total GAD 7 Score 7 18  Anxiety Difficulty Somewhat difficult Extremely difficult    Relevant past medical, surgical, family and social history reviewed and updated as indicated. Interim medical history since our last visit reviewed. Allergies and medications reviewed and updated.  Review of Systems    Constitutional: Negative.   Respiratory: Negative.   Cardiovascular: Negative.   Skin: Negative.   Neurological: Negative.   Psychiatric/Behavioral: Positive for dysphoric mood. Negative for agitation, behavioral problems, confusion, decreased concentration, hallucinations, self-injury, sleep disturbance and suicidal ideas. The patient is nervous/anxious. The patient is not hyperactive.     Per HPI unless specifically indicated above     Objective:    BP (!) 142/95   Pulse (!) 59   Temp 98.3 F (36.8 C) (Oral)   Wt 241 lb 6.4 oz (109.5 kg)   SpO2 97%   BMI 36.70 kg/m   Wt Readings from Last 3 Encounters:  01/05/18 241 lb 6.4 oz (109.5 kg)  12/13/17 249 lb 4 oz (113.1 kg)  11/26/17 256 lb (116.1 kg)    Physical Exam  Constitutional: He is oriented to person, place, and time. He appears well-developed and well-nourished. No distress.  HENT:  Head: Normocephalic and atraumatic.  Right Ear: Hearing normal.  Left Ear: Hearing normal.  Nose: Nose normal.  Eyes: Conjunctivae and lids are normal. Right eye exhibits no discharge. Left eye exhibits no discharge. No scleral icterus.  Cardiovascular: Normal rate, regular rhythm, normal heart sounds and intact distal pulses. Exam reveals no gallop and no friction rub.  No murmur heard. Pulmonary/Chest: Effort normal and breath sounds normal. No stridor. No  respiratory distress. He has no wheezes. He has no rales. He exhibits no tenderness.  Musculoskeletal: Normal range of motion.  Neurological: He is alert and oriented to person, place, and time.  Skin: Skin is warm, dry and intact. Capillary refill takes less than 2 seconds. No rash noted. He is not diaphoretic. No erythema. No pallor.  Psychiatric: He has a normal mood and affect. His speech is normal and behavior is normal. Judgment and thought content normal. Cognition and memory are normal.    Results for orders placed or performed in visit on 12/13/17  Microscopic Examination   Result Value Ref Range   WBC, UA 0-5 0 - 5 /hpf   RBC, UA 0-2 0 - 2 /hpf   Epithelial Cells (non renal) CANCELED    Bacteria, UA None seen None seen/Few  CBC with Differential/Platelet  Result Value Ref Range   WBC 7.5 3.4 - 10.8 x10E3/uL   RBC 5.71 4.14 - 5.80 x10E6/uL   Hemoglobin 18.3 (H) 13.0 - 17.7 g/dL   Hematocrit 45.4 (H) 09.8 - 51.0 %   MCV 93 79 - 97 fL   MCH 32.0 26.6 - 33.0 pg   MCHC 34.3 31.5 - 35.7 g/dL   RDW 11.9 14.7 - 82.9 %   Platelets 232 150 - 379 x10E3/uL   Neutrophils 63 Not Estab. %   Lymphs 25 Not Estab. %   Monocytes 9 Not Estab. %   Eos 2 Not Estab. %   Basos 1 Not Estab. %   Neutrophils Absolute 4.8 1.4 - 7.0 x10E3/uL   Lymphocytes Absolute 1.9 0.7 - 3.1 x10E3/uL   Monocytes Absolute 0.6 0.1 - 0.9 x10E3/uL   EOS (ABSOLUTE) 0.2 0.0 - 0.4 x10E3/uL   Basophils Absolute 0.0 0.0 - 0.2 x10E3/uL   Immature Granulocytes 0 Not Estab. %   Immature Grans (Abs) 0.0 0.0 - 0.1 x10E3/uL  Comprehensive metabolic panel  Result Value Ref Range   Glucose 95 65 - 99 mg/dL   BUN 13 6 - 24 mg/dL   Creatinine, Ser 5.62 0.76 - 1.27 mg/dL   GFR calc non Af Amer 76 >59 mL/min/1.73   GFR calc Af Amer 87 >59 mL/min/1.73   BUN/Creatinine Ratio 12 9 - 20   Sodium 142 134 - 144 mmol/L   Potassium 4.3 3.5 - 5.2 mmol/L   Chloride 103 96 - 106 mmol/L   CO2 24 20 - 29 mmol/L   Calcium 9.3 8.7 - 10.2 mg/dL   Total Protein 6.5 6.0 - 8.5 g/dL   Albumin 4.2 3.5 - 5.5 g/dL   Globulin, Total 2.3 1.5 - 4.5 g/dL   Albumin/Globulin Ratio 1.8 1.2 - 2.2   Bilirubin Total 0.2 0.0 - 1.2 mg/dL   Alkaline Phosphatase 90 39 - 117 IU/L   AST 21 0 - 40 IU/L   ALT 23 0 - 44 IU/L  Lipid Panel w/o Chol/HDL Ratio  Result Value Ref Range   Cholesterol, Total 235 (H) 100 - 199 mg/dL   Triglycerides 130 (H) 0 - 149 mg/dL   HDL 37 (L) >86 mg/dL   VLDL Cholesterol Cal 60 (H) 5 - 40 mg/dL   LDL Calculated 578 (H) 0 - 99 mg/dL  PSA  Result Value Ref Range   Prostate Specific Ag, Serum 0.9 0.0  - 4.0 ng/mL  UA/M w/rflx Culture, Routine  Result Value Ref Range   Specific Gravity, UA 1.025 1.005 - 1.030   pH, UA 6.0 5.0 - 7.5   Color, UA Yellow  Yellow   Appearance Ur Clear Clear   Leukocytes, UA Negative Negative   Protein, UA 2+ (A) Negative/Trace   Glucose, UA Negative Negative   Ketones, UA Negative Negative   RBC, UA 1+ (A) Negative   Bilirubin, UA Negative Negative   Urobilinogen, Ur 0.2 0.2 - 1.0 mg/dL   Nitrite, UA Negative Negative   Microscopic Examination See below:   Thyroid Panel With TSH  Result Value Ref Range   TSH 1.820 0.450 - 4.500 uIU/mL   T4, Total 7.3 4.5 - 12.0 ug/dL   T3 Uptake Ratio 28 24 - 39 %   Free Thyroxine Index 2.0 1.2 - 4.9  Hgb A1c w/o eAG  Result Value Ref Range   Hgb A1c MFr Bld 5.6 4.8 - 5.6 %      Assessment & Plan:   Problem List Items Addressed This Visit      Other   Panic disorder with agoraphobia - Primary    Better on medicine. Recheck 3-4 months. Encouraged patient to continue medication. Call with any concerns. Refills given.       Relevant Medications   busPIRone (BUSPAR) 5 MG tablet   venlafaxine XR (EFFEXOR-XR) 150 MG 24 hr capsule       Follow up plan: Return 3-4 months, for physical.

## 2018-01-05 NOTE — Assessment & Plan Note (Signed)
Better on medicine. Recheck 3-4 months. Encouraged patient to continue medication. Call with any concerns. Refills given.

## 2018-01-16 DIAGNOSIS — R079 Chest pain, unspecified: Secondary | ICD-10-CM | POA: Insufficient documentation

## 2018-01-16 DIAGNOSIS — R400 Somnolence: Secondary | ICD-10-CM | POA: Insufficient documentation

## 2018-01-17 DIAGNOSIS — R079 Chest pain, unspecified: Secondary | ICD-10-CM | POA: Diagnosis not present

## 2018-01-26 ENCOUNTER — Encounter: Admission: RE | Payer: Self-pay | Source: Ambulatory Visit

## 2018-01-26 ENCOUNTER — Ambulatory Visit
Admission: RE | Admit: 2018-01-26 | Payer: BLUE CROSS/BLUE SHIELD | Source: Ambulatory Visit | Admitting: Gastroenterology

## 2018-01-26 DIAGNOSIS — R0602 Shortness of breath: Secondary | ICD-10-CM | POA: Diagnosis not present

## 2018-01-26 DIAGNOSIS — G4733 Obstructive sleep apnea (adult) (pediatric): Secondary | ICD-10-CM | POA: Diagnosis not present

## 2018-01-26 SURGERY — COLONOSCOPY WITH PROPOFOL
Anesthesia: General

## 2018-01-27 DIAGNOSIS — R0602 Shortness of breath: Secondary | ICD-10-CM | POA: Diagnosis not present

## 2018-01-27 DIAGNOSIS — G4733 Obstructive sleep apnea (adult) (pediatric): Secondary | ICD-10-CM | POA: Diagnosis not present

## 2018-01-31 DIAGNOSIS — R400 Somnolence: Secondary | ICD-10-CM | POA: Diagnosis not present

## 2018-01-31 DIAGNOSIS — I10 Essential (primary) hypertension: Secondary | ICD-10-CM | POA: Diagnosis not present

## 2018-01-31 DIAGNOSIS — E782 Mixed hyperlipidemia: Secondary | ICD-10-CM | POA: Diagnosis not present

## 2018-01-31 DIAGNOSIS — R002 Palpitations: Secondary | ICD-10-CM | POA: Diagnosis not present

## 2018-02-09 DIAGNOSIS — G4733 Obstructive sleep apnea (adult) (pediatric): Secondary | ICD-10-CM | POA: Diagnosis not present

## 2018-02-09 DIAGNOSIS — R0602 Shortness of breath: Secondary | ICD-10-CM | POA: Diagnosis not present

## 2018-03-11 DIAGNOSIS — R0602 Shortness of breath: Secondary | ICD-10-CM | POA: Diagnosis not present

## 2018-03-11 DIAGNOSIS — G4733 Obstructive sleep apnea (adult) (pediatric): Secondary | ICD-10-CM | POA: Diagnosis not present

## 2018-04-11 DIAGNOSIS — R0602 Shortness of breath: Secondary | ICD-10-CM | POA: Diagnosis not present

## 2018-04-11 DIAGNOSIS — G4733 Obstructive sleep apnea (adult) (pediatric): Secondary | ICD-10-CM | POA: Diagnosis not present

## 2018-05-09 DIAGNOSIS — E782 Mixed hyperlipidemia: Secondary | ICD-10-CM | POA: Diagnosis not present

## 2018-05-09 DIAGNOSIS — R400 Somnolence: Secondary | ICD-10-CM | POA: Diagnosis not present

## 2018-05-09 DIAGNOSIS — I10 Essential (primary) hypertension: Secondary | ICD-10-CM | POA: Diagnosis not present

## 2018-05-09 DIAGNOSIS — R002 Palpitations: Secondary | ICD-10-CM | POA: Diagnosis not present

## 2018-05-11 ENCOUNTER — Encounter: Payer: BLUE CROSS/BLUE SHIELD | Admitting: Family Medicine

## 2018-05-11 NOTE — Progress Notes (Deleted)
There were no vitals taken for this visit.   Subjective:    Patient ID: Brady Edwards, male    DOB: [birth year], 52 y.o.   MRN: [id number]  HPI: Brady Edwards is a 52 y.o. male presenting on 05/11/2018 for comprehensive medical examination. Current medical complaints include:{Blank single:19197::"none","***"}  He currently lives with: Interim Problems from his last visit: {Blank single:19197::"yes","no"}  Depression Screen done today and results listed below:  Depression screen [facility] 2/9 01/05/2018 12/13/2017 06/13/2015 05/16/2015  Decreased Interest 1 3 2 3   Down, Depressed, Hopeless 1 3 1 3   PHQ - 2 Score 2 6 3 6   Altered sleeping 1 2 1 1   Tired, decreased energy 1 3 1 3   Change in appetite 1 3 0 3  Feeling bad or failure about yourself  0 2 1 3   Trouble concentrating 1 2 1 3   Moving slowly or fidgety/restless 0 0 0 3  Suicidal thoughts 0 0 0 0  PHQ-9 Score 6 18 7 22   Difficult doing work/chores - - Not difficult at all Very difficult    The patient {has/does not have:19849} a history of falls. I {did/did not:19850} complete a risk assessment for falls. A plan of care for falls {was/was not:19852} documented.   Past Medical History:  Past Medical History:  Diagnosis Date  . Atrial fibrillation (HCC)   . Depression   . Hypertension     Surgical History:  No past surgical history on file.  Medications:  Current Outpatient Medications on File Prior to Visit  Medication Sig  . busPIRone (BUSPAR) 5 MG tablet Take 1 mg by mouth 3 (three) times daily.  . metoprolol tartrate (LOPRESSOR) 25 MG tablet Take 0.5 tablets (12.5 mg total) by mouth 2 (two) times daily as needed (palpitations). (Patient not taking: Reported on 01/05/2018)  . venlafaxine XR (EFFEXOR-XR) 150 MG 24 hr capsule Take 1 capsule (150 mg total) by mouth daily with breakfast.   No current facility-administered medications on file prior to visit.     Allergies:  No Known Allergies  Social History:  Social  History   Socioeconomic History  . Marital status: Married    Spouse name: Not on file  . Number of children: Not on file  . Years of education: Not on file  . Highest education level: Not on file  Occupational History  . Not on file  Social Needs  . Financial resource strain: Not on file  . Food insecurity:    Worry: Not on file    Inability: Not on file  . Transportation needs:    Medical: Not on file    Non-medical: Not on file  Tobacco Use  . Smoking status: Current Every Day Smoker    Packs/day: 1.00    Types: Cigarettes  . Smokeless tobacco: Never Used  Substance and Sexual Activity  . Alcohol use: Not Currently    Alcohol/week: 0.0 standard drinks  . Drug use: No  . Sexual activity: Yes  Lifestyle  . Physical activity:    Days per week: Not on file    Minutes per session: Not on file  . Stress: Not on file  Relationships  . Social connections:    Talks on phone: Not on file    Gets together: Not on file    Attends religious service: Not on file    Active member of club or organization: Not on file    Attends meetings of clubs or organizations: Not on file  Relationship status: Not on file  . Intimate partner violence:    Fear of current or ex partner: Not on file    Emotionally abused: Not on file    Physically abused: Not on file    Forced sexual activity: Not on file  Other Topics Concern  . Not on file  Social History Narrative  . Not on file   Social History   Tobacco Use  Smoking Status Current Every Day Smoker  . Packs/day: 1.00  . Types: Cigarettes  Smokeless Tobacco Never Used   Social History   Substance and Sexual Activity  Alcohol Use Not Currently  . Alcohol/week: 0.0 standard drinks    Family History:  Family History  Problem Relation Age of Onset  . Cancer Mother        uterine, ovarian, and breast  . Heart disease Father        MI  . Diabetes Brother   . Cancer Maternal Grandmother        skin  . Cancer Maternal  Grandfather        skin  . Cancer Paternal Grandmother     Past medical history, surgical history, medications, allergies, family history and social history reviewed with patient today and changes made to appropriate areas of the chart.   ROS  All other ROS negative except what is listed above and in the HPI.      Objective:    There were no vitals taken for this visit.  Wt Readings from Last 3 Encounters:  01/05/18 241 lb 6.4 oz (109.5 kg)  12/13/17 249 lb 4 oz (113.1 kg)  11/26/17 256 lb (116.1 kg)    Physical Exam  Results for orders placed or performed in visit on 12/13/17  Microscopic Examination  Result Value Ref Range   WBC, UA 0-5 0 - 5 /hpf   RBC, UA 0-2 0 - 2 /hpf   Epithelial Cells (non renal) CANCELED    Bacteria, UA None seen None seen/Few  CBC with Differential/Platelet  Result Value Ref Range   WBC 7.5 3.4 - 10.8 x10E3/uL   RBC 5.71 4.14 - 5.80 x10E6/uL   Hemoglobin 18.3 (H) 13.0 - 17.7 g/dL   Hematocrit 27.2 (H) 53.6 - 51.0 %   MCV 93 79 - 97 fL   MCH 32.0 26.6 - 33.0 pg   MCHC 34.3 31.5 - 35.7 g/dL   RDW 64.4 03.4 - 74.2 %   Platelets 232 150 - 379 x10E3/uL   Neutrophils 63 Not Estab. %   Lymphs 25 Not Estab. %   Monocytes 9 Not Estab. %   Eos 2 Not Estab. %   Basos 1 Not Estab. %   Neutrophils Absolute 4.8 1.4 - 7.0 x10E3/uL   Lymphocytes Absolute 1.9 0.7 - 3.1 x10E3/uL   Monocytes Absolute 0.6 0.1 - 0.9 x10E3/uL   EOS (ABSOLUTE) 0.2 0.0 - 0.4 x10E3/uL   Basophils Absolute 0.0 0.0 - 0.2 x10E3/uL   Immature Granulocytes 0 Not Estab. %   Immature Grans (Abs) 0.0 0.0 - 0.1 x10E3/uL  Comprehensive metabolic panel  Result Value Ref Range   Glucose 95 65 - 99 mg/dL   BUN 13 6 - 24 mg/dL   Creatinine, Ser 5.95 0.76 - 1.27 mg/dL   GFR calc non Af Amer 76 >59 mL/min/1.73   GFR calc Af Amer 87 >59 mL/min/1.73   BUN/Creatinine Ratio 12 9 - 20   Sodium 142 134 - 144 mmol/L   Potassium 4.3 3.5 - 5.2  mmol/L   Chloride 103 96 - 106 mmol/L   CO2 24 20  - 29 mmol/L   Calcium 9.3 8.7 - 10.2 mg/dL   Total Protein 6.5 6.0 - 8.5 g/dL   Albumin 4.2 3.5 - 5.5 g/dL   Globulin, Total 2.3 1.5 - 4.5 g/dL   Albumin/Globulin Ratio 1.8 1.2 - 2.2   Bilirubin Total 0.2 0.0 - 1.2 mg/dL   Alkaline Phosphatase 90 39 - 117 IU/L   AST 21 0 - 40 IU/L   ALT 23 0 - 44 IU/L  Lipid Panel w/o Chol/HDL Ratio  Result Value Ref Range   Cholesterol, Total 235 (H) 100 - 199 mg/dL   Triglycerides 401298 (H) 0 - 149 mg/dL   HDL 37 (L) >02>39 mg/dL   VLDL Cholesterol Cal 60 (H) 5 - 40 mg/dL   LDL Calculated 725138 (H) 0 - 99 mg/dL  PSA  Result Value Ref Range   Prostate Specific Ag, Serum 0.9 0.0 - 4.0 ng/mL  UA/M w/rflx Culture, Routine  Result Value Ref Range   Specific Gravity, UA 1.025 1.005 - 1.030   pH, UA 6.0 5.0 - 7.5   Color, UA Yellow Yellow   Appearance Ur Clear Clear   Leukocytes, UA Negative Negative   Protein, UA 2+ (A) Negative/Trace   Glucose, UA Negative Negative   Ketones, UA Negative Negative   RBC, UA 1+ (A) Negative   Bilirubin, UA Negative Negative   Urobilinogen, Ur 0.2 0.2 - 1.0 mg/dL   Nitrite, UA Negative Negative   Microscopic Examination See below:   Thyroid Panel With TSH  Result Value Ref Range   TSH 1.820 0.450 - 4.500 uIU/mL   T4, Total 7.3 4.5 - 12.0 ug/dL   T3 Uptake Ratio 28 24 - 39 %   Free Thyroxine Index 2.0 1.2 - 4.9  Hgb A1c w/o eAG  Result Value Ref Range   Hgb A1c MFr Bld 5.6 4.8 - 5.6 %      Assessment & Plan:   Problem List Items Addressed This Visit      Other   Panic disorder with agoraphobia    Other Visit Diagnoses    Routine general medical examination at a health care facility    -  Primary      LABORATORY TESTING:  Health maintenance labs ordered today as discussed above.   The natural history of prostate cancer and ongoing controversy regarding screening and potential treatment outcomes of prostate cancer has been discussed with the patient. The meaning of a false positive PSA and a false negative  PSA has been discussed. He indicates understanding of the limitations of this screening test and wishes to proceed with screening PSA testing.   IMMUNIZATIONS:   - Tdap: Tetanus vaccination status reviewed: {tetanus status:315746}. - Influenza: {Blank single:19197::"Up to date","Administered today","Postponed to flu season","Refused","Given elsewhere"} - Pneumovax: {Blank single:19197::"Up to date","Administered today","Not applicable","Refused","Given elsewhere"} - Prevnar: {Blank single:19197::"Up to date","Administered today","Not applicable","Refused","Given elsewhere"} - HPV: {Blank single:19197::"Up to date","Administered today","Not applicable","Refused","Given elsewhere"} - Zostavax vaccine: {Blank single:19197::"Up to date","Administered today","Not applicable","Refused","Given elsewhere"}  PATIENT COUNSELING:    Sexuality: Discussed sexually transmitted diseases, partner selection, use of condoms, avoidance of unintended pregnancy  and contraceptive alternatives.   Advised to avoid cigarette smoking.  I discussed with the patient that most people either abstain from alcohol or drink within safe limits (<=14/week and <=4 drinks/occasion for males, <=7/weeks and <= 3 drinks/occasion for females) and that the risk for alcohol disorders and other health effects rises proportionally  with the number of drinks per week and how often a drinker exceeds daily limits.  Discussed cessation/primary prevention of drug use and availability of treatment for abuse.   Diet: Encouraged to adjust caloric intake to maintain  or achieve ideal body weight, to reduce intake of dietary saturated fat and total fat, to limit sodium intake by avoiding high sodium foods and not adding table salt, and to maintain adequate dietary potassium and calcium preferably from fresh fruits, vegetables, and low-fat dairy products.    stressed the importance of regular exercise  Injury prevention: Discussed safety belts,  safety helmets, smoke detector, smoking near bedding or upholstery.   Dental health: Discussed importance of regular tooth brushing, flossing, and dental visits.   Follow up plan: NEXT PREVENTATIVE PHYSICAL DUE IN 1 YEAR. No follow-ups on file.

## 2018-05-12 DIAGNOSIS — G4733 Obstructive sleep apnea (adult) (pediatric): Secondary | ICD-10-CM | POA: Diagnosis not present

## 2018-05-12 DIAGNOSIS — R0602 Shortness of breath: Secondary | ICD-10-CM | POA: Diagnosis not present

## 2018-07-05 ENCOUNTER — Other Ambulatory Visit: Payer: Self-pay | Admitting: Family Medicine

## 2018-07-05 NOTE — Telephone Encounter (Signed)
Attempted to call patient to schedule appointment- no voicemail set up- note added to Rx refill. Courtesy refill given . Requested Prescriptions  Pending Prescriptions Disp Refills  . venlafaxine XR (EFFEXOR-XR) 150 MG 24 hr capsule [Pharmacy Med Name: VENLAFAXINE HCL ER 150 MG CAP] 30 capsule 0    Sig: Take 1 capsule (150 mg total) by mouth daily with breakfast. Needs appointment for further refills     Psychiatry: Antidepressants - SNRI - desvenlafaxine & venlafaxine Failed - 07/05/2018 12:19 PM      Failed - LDL in normal range and within 360 days    LDL Calculated  Date Value Ref Range Status  12/13/2017 138 (H) 0 - 99 mg/dL Final         Failed - Total Cholesterol in normal range and within 360 days    Cholesterol, Total  Date Value Ref Range Status  12/13/2017 235 (H) 100 - 199 mg/dL Final         Failed - Triglycerides in normal range and within 360 days    Triglycerides  Date Value Ref Range Status  12/13/2017 298 (H) 0 - 149 mg/dL Final         Failed - Last BP in normal range    BP Readings from Last 1 Encounters:  01/05/18 (!) 142/95         Failed - Valid encounter within last 6 months    Recent Outpatient Visits          6 months ago Panic disorder with agoraphobia   [facility] [place], Megan P, DO   6 months ago Palpitations   [facility] Hondo, Edna Bay, DO   3 years ago Panic disorder with agoraphobia   [facility] Lada, Janit Bern, MD   3 years ago Palpitations   [facility] Lada, Janit Bern, MD

## 2018-08-24 ENCOUNTER — Other Ambulatory Visit: Payer: Self-pay | Admitting: Family Medicine

## 2018-08-24 MED ORDER — VENLAFAXINE HCL ER 150 MG PO CP24: 150.0000 mg | ORAL_CAPSULE | Freq: Every day | ORAL | 0 refills | Status: DC

## 2018-08-24 NOTE — Telephone Encounter (Signed)
Courtesy refill; Pt has appt 08/29/18  Dr. Laural BenesJohnson

## 2018-08-24 NOTE — Telephone Encounter (Signed)
Copied from CRM [id number]. Topic: Quick Communication - Rx Refill/Question >> Aug 24, 2018  8:27 AM Angela NevinWilliams, Candice N wrote: Medication:  venlafaxine XR (EFFEXOR-XR) 150 MG 24 hr capsule  Patient is requesting a refill of this medication. Patient took last tablet yesterday.   Preferred Pharmacy (with phone number or street name):[facility] - [place], [place] - [address] [phone] (Phone) [phone] (Fax)

## 2018-08-29 ENCOUNTER — Encounter: Payer: Self-pay | Admitting: Family Medicine

## 2018-08-29 ENCOUNTER — Ambulatory Visit: Payer: BLUE CROSS/BLUE SHIELD | Admitting: Family Medicine

## 2018-08-29 VITALS — BP 154/104 | HR 66 | Temp 98.6°F | Wt 260.0 lb

## 2018-08-29 DIAGNOSIS — F4001 Agoraphobia with panic disorder: Secondary | ICD-10-CM | POA: Diagnosis not present

## 2018-08-29 DIAGNOSIS — E785 Hyperlipidemia, unspecified: Secondary | ICD-10-CM | POA: Insufficient documentation

## 2018-08-29 DIAGNOSIS — I10 Essential (primary) hypertension: Secondary | ICD-10-CM | POA: Insufficient documentation

## 2018-08-29 MED ORDER — VENLAFAXINE HCL ER 150 MG PO CP24: 150.0000 mg | ORAL_CAPSULE | Freq: Every day | ORAL | 3 refills | Status: DC

## 2018-08-29 MED ORDER — METOPROLOL TARTRATE 25 MG PO TABS: 12.5000 mg | ORAL_TABLET | Freq: Two times a day (BID) | ORAL | 5 refills | Status: DC | PRN

## 2018-08-29 MED ORDER — HYDROXYZINE HCL 25 MG PO TABS: 25.0000 mg | ORAL_TABLET | Freq: Three times a day (TID) | ORAL | 3 refills | Status: DC | PRN

## 2018-08-29 MED ORDER — VENLAFAXINE HCL ER 75 MG PO CP24: 75.0000 mg | ORAL_CAPSULE | Freq: Every day | ORAL | 3 refills | Status: DC

## 2018-08-29 NOTE — Progress Notes (Addendum)
BP (!) 154/104   Pulse 66   Temp 98.6 F (37 C) (Oral)   Wt 260 lb (117.9 kg)   SpO2 97%   BMI 39.53 kg/m    Subjective:    Patient ID: Brady Edwards, male    DOB: Sep 07, 1965, 52 y.o.   MRN: [id number]  HPI: Brady Edwards is a 52 y.o. male who presents today after being lost to follow up for 7 months.   Chief Complaint  Patient presents with  . Medication Refill  . Anxiety   ANXIETY/STRESS- has been taking the medicine. Still having issues where feels really anxious, still having some trouble leaving the house, has not done any counselling. Admitted today to a very significant trauma history. His brother in law murdered his mother, sister and nephews about 11 years ago. He downplays the effect that it had on him, but would like to talk to someone about it. Duration:better- still happening a lot Anxious mood: yes  Excessive worrying: yes Irritability: no  Sweating: yes Nausea: no Palpitations:yes Hyperventilation: no Panic attacks: yes Agoraphobia: yes  Obscessions/compulsions: yes Depressed mood: yes [facility] 08/29/2018 01/05/2018 12/13/2017 06/13/2015 05/16/2015  Decreased Interest 3 1 3 2 3   Down, Depressed, Hopeless 2 1 3 1 3   PHQ - 2 Score 5 2 6 3 6   Altered sleeping 1 1 2 1 1   Tired, decreased energy 2 1 3 1 3   Change in appetite 3 1 3  0 3  Feeling bad or failure about yourself  2 0 2 1 3   Trouble concentrating 0 1 2 1 3   Moving slowly or fidgety/restless 0 0 0 0 3  Suicidal thoughts 0 0 0 0 0  PHQ-9 Score 13 6 18 7 22   Difficult doing work/chores Very difficult - - Not difficult at all Very difficult   GAD 7 : Generalized Anxiety Score 08/29/2018 01/05/2018 12/13/2017  Nervous, Anxious, on Edge 3 1 3   Control/stop worrying 2 2 3   Worry too much - different things 2 1 3   Trouble relaxing 2 1 3   Restless 0 0 2  Easily annoyed or irritable 1 1 1   Afraid - awful might happen 1 1 3   Total GAD 7 Score 11 7 18   Anxiety Difficulty Somewhat  difficult Somewhat difficult Extremely difficult   Anhedonia: no Weight changes: no Insomnia: yes hard to fall asleep- occasionally  Hypersomnia: no Fatigue/loss of energy: yes Feelings of worthlessness: yes Feelings of guilt: yes Impaired concentration/indecisiveness: no Suicidal ideations: no  Crying spells: no Recent Stressors/Life Changes: no   Relationship problems: no   Family stress: no     Financial stress: no    Job stress: no    Recent death/loss: no  Relevant past medical, surgical, family and social history reviewed and updated as indicated. Interim medical history since our last visit reviewed. Allergies and medications reviewed and updated.  Review of Systems  Constitutional: Negative.   Respiratory: Negative.   Cardiovascular: Negative.   Skin: Negative.   Psychiatric/Behavioral: Positive for dysphoric mood. Negative for agitation, behavioral problems, confusion, decreased concentration, hallucinations, self-injury, sleep disturbance and suicidal ideas. The patient is nervous/anxious. The patient is not hyperactive.     Per HPI unless specifically indicated above     Objective:    BP (!) 154/104   Pulse 66   Temp 98.6 F (37 C) (Oral)   Wt 260 lb (117.9 kg)   SpO2 97%   BMI 39.53 kg/m   Wt Readings  from Last 3 Encounters:  08/29/18 260 lb (117.9 kg)  01/05/18 241 lb 6.4 oz (109.5 kg)  12/13/17 249 lb 4 oz (113.1 kg)    Physical Exam Vitals signs and nursing note reviewed.  Constitutional:      General: He is not in acute distress.    Appearance: Normal appearance. He is not ill-appearing, toxic-appearing or diaphoretic.  HENT:     Head: Normocephalic and atraumatic.     Right Ear: External ear normal.     Left Ear: External ear normal.     Nose: Nose normal.     Mouth/Throat:     Mouth: Mucous membranes are moist.     Pharynx: Oropharynx is clear.  Eyes:     General: No scleral icterus.       Right eye: No discharge.        Left eye: No  discharge.     Extraocular Movements: Extraocular movements intact.     Conjunctiva/sclera: Conjunctivae normal.     Pupils: Pupils are equal, round, and reactive to light.  Neck:     Musculoskeletal: Normal range of motion and neck supple.  Cardiovascular:     Rate and Rhythm: Normal rate and regular rhythm.     Pulses: Normal pulses.     Heart sounds: Normal heart sounds. No murmur. No friction rub. No gallop.   Pulmonary:     Effort: Pulmonary effort is normal. No respiratory distress.     Breath sounds: Normal breath sounds. No stridor. No wheezing, rhonchi or rales.  Chest:     Chest wall: No tenderness.  Musculoskeletal: Normal range of motion.  Skin:    General: Skin is warm and dry.     Capillary Refill: Capillary refill takes less than 2 seconds.     Coloration: Skin is not jaundiced or pale.     Findings: No bruising, erythema, lesion or rash.  Neurological:     General: No focal deficit present.     Mental Status: He is alert and oriented to person, place, and time. Mental status is at baseline.  Psychiatric:        Mood and Affect: Mood is anxious.        Behavior: Behavior normal.        Thought Content: Thought content normal.        Judgment: Judgment normal.     Results for orders placed or performed in visit on 12/13/17  Microscopic Examination  Result Value Ref Range   WBC, UA 0-5 0 - 5 /hpf   RBC, UA 0-2 0 - 2 /hpf   Epithelial Cells (non renal) CANCELED    Bacteria, UA None seen None seen/Few  CBC with Differential/Platelet  Result Value Ref Range   WBC 7.5 3.4 - 10.8 x10E3/uL   RBC 5.71 4.14 - 5.80 x10E6/uL   Hemoglobin 18.3 (H) 13.0 - 17.7 g/dL   Hematocrit 21.3 (H) 08.6 - 51.0 %   MCV 93 79 - 97 fL   MCH 32.0 26.6 - 33.0 pg   MCHC 34.3 31.5 - 35.7 g/dL   RDW 57.8 46.9 - 62.9 %   Platelets 232 150 - 379 x10E3/uL   Neutrophils 63 Not Estab. %   Lymphs 25 Not Estab. %   Monocytes 9 Not Estab. %   Eos 2 Not Estab. %   Basos 1 Not Estab. %    Neutrophils Absolute 4.8 1.4 - 7.0 x10E3/uL   Lymphocytes Absolute 1.9 0.7 - 3.1 x10E3/uL  Monocytes Absolute 0.6 0.1 - 0.9 x10E3/uL   EOS (ABSOLUTE) 0.2 0.0 - 0.4 x10E3/uL   Basophils Absolute 0.0 0.0 - 0.2 x10E3/uL   Immature Granulocytes 0 Not Estab. %   Immature Grans (Abs) 0.0 0.0 - 0.1 x10E3/uL  Comprehensive metabolic panel  Result Value Ref Range   Glucose 95 65 - 99 mg/dL   BUN 13 6 - 24 mg/dL   Creatinine, Ser 1.61 0.76 - 1.27 mg/dL   GFR calc non Af Amer 76 >59 mL/min/1.73   GFR calc Af Amer 87 >59 mL/min/1.73   BUN/Creatinine Ratio 12 9 - 20   Sodium 142 134 - 144 mmol/L   Potassium 4.3 3.5 - 5.2 mmol/L   Chloride 103 96 - 106 mmol/L   CO2 24 20 - 29 mmol/L   Calcium 9.3 8.7 - 10.2 mg/dL   Total Protein 6.5 6.0 - 8.5 g/dL   Albumin 4.2 3.5 - 5.5 g/dL   Globulin, Total 2.3 1.5 - 4.5 g/dL   Albumin/Globulin Ratio 1.8 1.2 - 2.2   Bilirubin Total 0.2 0.0 - 1.2 mg/dL   Alkaline Phosphatase 90 39 - 117 IU/L   AST 21 0 - 40 IU/L   ALT 23 0 - 44 IU/L  Lipid Panel w/o Chol/HDL Ratio  Result Value Ref Range   Cholesterol, Total 235 (H) 100 - 199 mg/dL   Triglycerides 096 (H) 0 - 149 mg/dL   HDL 37 (L) >04 mg/dL   VLDL Cholesterol Cal 60 (H) 5 - 40 mg/dL   LDL Calculated 540 (H) 0 - 99 mg/dL  PSA  Result Value Ref Range   Prostate Specific Ag, Serum 0.9 0.0 - 4.0 ng/mL  UA/M w/rflx Culture, Routine  Result Value Ref Range   Specific Gravity, UA 1.025 1.005 - 1.030   pH, UA 6.0 5.0 - 7.5   Color, UA Yellow Yellow   Appearance Ur Clear Clear   Leukocytes, UA Negative Negative   Protein, UA 2+ (A) Negative/Trace   Glucose, UA Negative Negative   Ketones, UA Negative Negative   RBC, UA 1+ (A) Negative   Bilirubin, UA Negative Negative   Urobilinogen, Ur 0.2 0.2 - 1.0 mg/dL   Nitrite, UA Negative Negative   Microscopic Examination See below:   Thyroid Panel With TSH  Result Value Ref Range   TSH 1.820 0.450 - 4.500 uIU/mL   T4, Total 7.3 4.5 - 12.0 ug/dL   T3  Uptake Ratio 28 24 - 39 %   Free Thyroxine Index 2.0 1.2 - 4.9  Hgb A1c w/o eAG  Result Value Ref Range   Hgb A1c MFr Bld 5.6 4.8 - 5.6 %      Assessment & Plan:   Problem List Items Addressed This Visit      Cardiovascular and Mediastinum   Hypertension    Under good control on current regimen. Continue current regimen. Continue to monitor. Call with any concerns. Refills given.        Relevant Medications   metoprolol tartrate (LOPRESSOR) 25 MG tablet     Other   Panic disorder with agoraphobia - Primary    Will get him into see trauma therapist. Information given to him today. Will start hydroxyzine and increase effexor to 225mg  daily. Call with any concerns.       Relevant Medications   venlafaxine XR (EFFEXOR-XR) 150 MG 24 hr capsule   venlafaxine XR (EFFEXOR XR) 75 MG 24 hr capsule   hydrOXYzine (ATARAX/VISTARIL) 25 MG tablet  Follow up plan: Return in about 4 weeks (around 09/26/2018) for follow up mood.

## 2018-08-29 NOTE — Assessment & Plan Note (Signed)
Under good control on current regimen. Continue current regimen. Continue to monitor. Call with any concerns. Refills given.   

## 2018-08-29 NOTE — Assessment & Plan Note (Signed)
Will get him into see trauma therapist. Information given to him today. Will start hydroxyzine and increase effexor to 225mg  daily. Call with any concerns.

## 2018-09-01 ENCOUNTER — Telehealth: Payer: Self-pay

## 2018-09-01 NOTE — Telephone Encounter (Signed)
Copied from CRM [id number]. Topic: General - Inquiry >> Sep 01, 2018 10:27 AM Windy Kalata, NT wrote: Reason for CRM: patient is calling and states he was seen on 08/29/18 and he just noticed that his blood pressure medication was increased in dosage. Patient states this was no mentioned so he is wanting to make sure that is correct. Patient also states when reading his bottle last night it states to take one in the morning and one in afternoon. Patient states he has always taken one in the morning and nothing in the afternoon and wanted to know if that was the problem. Patient is unsure of the name of the medication and is not home to read the bottle. Requesting a call back. Please advise.

## 2018-09-01 NOTE — Telephone Encounter (Signed)
I do not see where the metoprolol was increased. Just the effexor. However, the BP medication is the one that is BID. Please advise.

## 2018-09-01 NOTE — Telephone Encounter (Signed)
I didn't increase his metoprolol- I just refilled what he was on before.

## 2018-09-01 NOTE — Telephone Encounter (Signed)
Attempted to reach pt. VM box has not been set up.

## 2018-09-04 MED ORDER — METOPROLOL TARTRATE 25 MG PO TABS: 25.0000 mg | ORAL_TABLET | Freq: Two times a day (BID) | ORAL | 1 refills | Status: DC

## 2018-09-04 NOTE — Telephone Encounter (Signed)
Patient aware. Cancelled 12.5 mg bid at [facility].

## 2018-09-04 NOTE — Telephone Encounter (Signed)
Patient walked into clinic to discuss. He came in with 2 different bottles of his metoprolol. One from Dr. Lady Gary (pt stated this was his cardiologist at [facility]) and one from our office. Bottle from Dr. America Brown office stated take 1 tablet BID (25 mg) the second prescribed by Dr. Laural Benes that stated to take 1/2 tablet BID (25 mg). It appears [place] has both orders and is filling both. I will call to cancel one. Please advise.   Suggested that patient call cardiologist and he stated he'd prefer that Dr. Laural Benes manage. Pt had been taking 25 mg BID.

## 2018-09-04 NOTE — Telephone Encounter (Signed)
I would like him to continue taking the 25mg  BID- I will adjust our Rx and send it over, please cancel the other 2- only have them fill the new Rx to avoid confusion. Thanks!

## 2018-09-26 ENCOUNTER — Encounter: Payer: BLUE CROSS/BLUE SHIELD | Admitting: Family Medicine

## 2018-10-06 ENCOUNTER — Other Ambulatory Visit: Payer: Self-pay

## 2018-10-06 ENCOUNTER — Encounter: Payer: Self-pay | Admitting: Family Medicine

## 2018-10-06 ENCOUNTER — Ambulatory Visit (INDEPENDENT_AMBULATORY_CARE_PROVIDER_SITE_OTHER): Payer: BLUE CROSS/BLUE SHIELD | Admitting: Family Medicine

## 2018-10-06 VITALS — BP 138/93 | HR 63 | Temp 98.7°F | Ht 70.0 in | Wt 259.0 lb

## 2018-10-06 DIAGNOSIS — Z0000 Encounter for general adult medical examination without abnormal findings: Secondary | ICD-10-CM

## 2018-10-06 DIAGNOSIS — I10 Essential (primary) hypertension: Secondary | ICD-10-CM | POA: Diagnosis not present

## 2018-10-06 DIAGNOSIS — R3914 Feeling of incomplete bladder emptying: Secondary | ICD-10-CM

## 2018-10-06 DIAGNOSIS — E782 Mixed hyperlipidemia: Secondary | ICD-10-CM | POA: Diagnosis not present

## 2018-10-06 DIAGNOSIS — F4001 Agoraphobia with panic disorder: Secondary | ICD-10-CM

## 2018-10-06 DIAGNOSIS — N401 Enlarged prostate with lower urinary tract symptoms: Secondary | ICD-10-CM | POA: Diagnosis not present

## 2018-10-06 DIAGNOSIS — N400 Enlarged prostate without lower urinary tract symptoms: Secondary | ICD-10-CM | POA: Insufficient documentation

## 2018-10-06 LAB — UA/M W/RFLX CULTURE, ROUTINE
Bilirubin, UA: NEGATIVE
Glucose, UA: NEGATIVE
Ketones, UA: NEGATIVE
Leukocytes, UA: NEGATIVE
Nitrite, UA: NEGATIVE
Specific Gravity, UA: 1.025 (ref 1.005–1.030)
UUROB: 0.2 mg/dL (ref 0.2–1.0)
pH, UA: 5.5 (ref 5.0–7.5)

## 2018-10-06 MED ORDER — TAMSULOSIN HCL 0.4 MG PO CAPS: 0.4000 mg | ORAL_CAPSULE | Freq: Every day | ORAL | 1 refills | Status: DC

## 2018-10-06 MED ORDER — METOPROLOL SUCCINATE ER 25 MG PO TB24: 25.0000 mg | ORAL_TABLET | Freq: Every day | ORAL | 1 refills | Status: DC

## 2018-10-06 MED ORDER — VENLAFAXINE HCL ER 75 MG PO CP24: 75.0000 mg | ORAL_CAPSULE | Freq: Every day | ORAL | 1 refills | Status: DC

## 2018-10-06 MED ORDER — VENLAFAXINE HCL ER 150 MG PO CP24: 150.0000 mg | ORAL_CAPSULE | Freq: Every day | ORAL | 1 refills | Status: DC

## 2018-10-06 NOTE — Assessment & Plan Note (Signed)
Having trouble remembering to take his metoprolol BID. Will change to ER. Call with any concerns.

## 2018-10-06 NOTE — Assessment & Plan Note (Signed)
Checking PSA. Start flomax. Call with any concerns.

## 2018-10-06 NOTE — Progress Notes (Signed)
BP (!) 138/93   Pulse 63   Temp 98.7 F (37.1 C) (Oral)   Ht 5\' 10"  (1.778 m)   Wt 259 lb (117.5 kg)   SpO2 95%   BMI 37.16 kg/m    Subjective:    Patient ID: Brady CousinKenneth A Klee, male    DOB: 10-Jul-1966, 53 y.o.   MRN: [id number]  HPI: Brady Edwards is a 53 y.o. male presenting on 10/06/2018 for comprehensive medical examination. Current medical complaints include:  HYPERTENSION Hypertension status: controlled  Satisfied with current treatment? yes Duration of hypertension: chronic BP monitoring frequency:  not checking BP medication side effects:  no Medication compliance: fair compliance Previous BP meds:metoprolol Aspirin: no Recurrent headaches: no Visual changes: no Palpitations: no Dyspnea: no Chest pain: no Lower extremity edema: no Dizzy/lightheaded: no  ANXIETY/STRESS Duration:stable Anxious mood: yes  Excessive worrying: yes Irritability: no  Sweating: no Nausea: no Palpitations:no Hyperventilation: no Panic attacks: yes Agoraphobia: yes  Obscessions/compulsions: no Depressed mood: yes [facility] 10/06/2018 08/29/2018 01/05/2018 12/13/2017 06/13/2015  Decreased Interest 1 3 1 3 2   Down, Depressed, Hopeless 1 2 1 3 1   PHQ - 2 Score 2 5 2 6 3   Altered sleeping 1 1 1 2 1   Tired, decreased energy 1 2 1 3 1   Change in appetite 1 3 1 3  0  Feeling bad or failure about yourself  1 2 0 2 1  Trouble concentrating 0 0 1 2 1   Moving slowly or fidgety/restless 0 0 0 0 0  Suicidal thoughts 0 0 0 0 0  PHQ-9 Score 6 13 6 18 7   Difficult doing work/chores Very difficult Very difficult - - Not difficult at all   GAD 7 : Generalized Anxiety Score 10/06/2018 08/29/2018 01/05/2018 12/13/2017  Nervous, Anxious, on Edge 1 3 1 3   Control/stop worrying 1 2 2 3   Worry too much - different things 1 2 1 3   Trouble relaxing 1 2 1 3   Restless 0 0 0 2  Easily annoyed or irritable 1 1 1 1   Afraid - awful might happen 1 1 1 3   Total GAD 7 Score 6 11 7 18   Anxiety  Difficulty Not difficult at all Somewhat difficult Somewhat difficult Extremely difficult   Anhedonia: no Weight changes: no Insomnia: no   Hypersomnia: no Fatigue/loss of energy: yes Feelings of worthlessness: no Feelings of guilt: no Impaired concentration/indecisiveness: no Suicidal ideations: no  Crying spells: no Recent Stressors/Life Changes: no   Relationship problems: no   Family stress: no     Financial stress: no    Job stress: no    Recent death/loss: no  Interim Problems from his last visit: no  Depression Screen done today and results listed below:  [facility] screen [facility] 10/06/2018 08/29/2018 01/05/2018 12/13/2017 06/13/2015  Decreased Interest 1 3 1 3 2   Down, Depressed, Hopeless 1 2 1 3 1   PHQ - 2 Score 2 5 2 6 3   Altered sleeping 1 1 1 2 1   Tired, decreased energy 1 2 1 3 1   Change in appetite 1 3 1 3  0  Feeling bad or failure about yourself  1 2 0 2 1  Trouble concentrating 0 0 1 2 1   Moving slowly or fidgety/restless 0 0 0 0 0  Suicidal thoughts 0 0 0 0 0  PHQ-9 Score 6 13 6 18 7   Difficult doing work/chores Very difficult Very difficult - - Not difficult at all   Past Medical  History:  Past Medical History:  Diagnosis Date  . Atrial fibrillation (HCC)   . Depression   . Hypertension     Surgical History:  History reviewed. No pertinent surgical history.  Medications:  Current Outpatient Medications on File Prior to Visit  Medication Sig  . hydrOXYzine (ATARAX/VISTARIL) 25 MG tablet Take 1 tablet (25 mg total) by mouth 3 (three) times daily as needed for anxiety.   No current facility-administered medications on file prior to visit.     Allergies:  No Known Allergies  Social History:  Social History   Socioeconomic History  . Marital status: Married    Spouse name: Not on file  . Number of children: Not on file  . Years of education: Not on file  . Highest education level: Not on file  Occupational History  . Not on file  Social  Needs  . Financial resource strain: Not on file  . Food insecurity:    Worry: Not on file    Inability: Not on file  . Transportation needs:    Medical: Not on file    Non-medical: Not on file  Tobacco Use  . Smoking status: Current Every Day Smoker    Packs/day: 1.00    Types: Cigarettes  . Smokeless tobacco: Never Used  Substance and Sexual Activity  . Alcohol use: Not Currently    Alcohol/week: 0.0 standard drinks  . Drug use: No  . Sexual activity: Yes  Lifestyle  . Physical activity:    Days per week: Not on file    Minutes per session: Not on file  . Stress: Not on file  Relationships  . Social connections:    Talks on phone: Not on file    Gets together: Not on file    Attends religious service: Not on file    Active member of club or organization: Not on file    Attends meetings of clubs or organizations: Not on file    Relationship status: Not on file  . Intimate partner violence:    Fear of current or ex partner: Not on file    Emotionally abused: Not on file    Physically abused: Not on file    Forced sexual activity: Not on file  Other Topics Concern  . Not on file  Social History Narrative  . Not on file   Social History   Tobacco Use  Smoking Status Current Every Day Smoker  . Packs/day: 1.00  . Types: Cigarettes  Smokeless Tobacco Never Used   Social History   Substance and Sexual Activity  Alcohol Use Not Currently  . Alcohol/week: 0.0 standard drinks    Family History:  Family History  Problem Relation Age of Onset  . Cancer Mother        uterine, ovarian, and breast  . Heart disease Father        MI  . Diabetes Brother   . Cancer Maternal Grandmother        skin  . Cancer Maternal Grandfather        skin  . Cancer Paternal Grandmother     Past medical history, surgical history, medications, allergies, family history and social history reviewed with patient today and changes made to appropriate areas of the chart.   Review of  Systems  Constitutional: Negative.   HENT: Negative.   Eyes: Positive for blurred vision. Negative for double vision, photophobia, pain, discharge and redness.  Respiratory: Negative.   Cardiovascular: Positive for palpitations. Negative for chest  pain, orthopnea, claudication, leg swelling and PND.  Gastrointestinal: Positive for diarrhea and heartburn. Negative for abdominal pain, blood in stool, constipation, melena, nausea and vomiting.  Genitourinary: Negative.        Weak stream, dribbling, incomplete emptying  Musculoskeletal: Positive for myalgias. Negative for back pain, falls, joint pain and neck pain.  Skin: Negative.   Neurological: Negative.   Endo/Heme/Allergies: Positive for polydipsia. Negative for environmental allergies. Does not bruise/bleed easily.  Psychiatric/Behavioral: Positive for depression. Negative for hallucinations, memory loss, substance abuse and suicidal ideas. The patient is nervous/anxious. The patient does not have insomnia.     All other ROS negative except what is listed above and in the HPI.      Objective:    BP (!) 138/93   Pulse 63   Temp 98.7 F (37.1 C) (Oral)   Ht 5\' 10"  (1.778 m)   Wt 259 lb (117.5 kg)   SpO2 95%   BMI 37.16 kg/m   Wt Readings from Last 3 Encounters:  10/06/18 259 lb (117.5 kg)  08/29/18 260 lb (117.9 kg)  01/05/18 241 lb 6.4 oz (109.5 kg)    Physical Exam Vitals signs and nursing note reviewed.  Constitutional:      General: He is not in acute distress.    Appearance: Normal appearance. He is obese. He is not ill-appearing, toxic-appearing or diaphoretic.  HENT:     Head: Normocephalic and atraumatic.     Right Ear: Tympanic membrane, ear canal and external ear normal. There is no impacted cerumen.     Left Ear: Tympanic membrane, ear canal and external ear normal. There is no impacted cerumen.     Nose: Nose normal. No congestion or rhinorrhea.     Mouth/Throat:     Mouth: Mucous membranes are moist.      Pharynx: Oropharynx is clear. No oropharyngeal exudate or posterior oropharyngeal erythema.  Eyes:     General: No scleral icterus.       Right eye: No discharge.        Left eye: No discharge.     Extraocular Movements: Extraocular movements intact.     Conjunctiva/sclera: Conjunctivae normal.     Pupils: Pupils are equal, round, and reactive to light.  Neck:     Musculoskeletal: Normal range of motion and neck supple. No neck rigidity or muscular tenderness.     Vascular: No carotid bruit.  Cardiovascular:     Rate and Rhythm: Normal rate and regular rhythm.     Pulses: Normal pulses.     Heart sounds: No murmur. No friction rub. No gallop.   Pulmonary:     Effort: Pulmonary effort is normal. No respiratory distress.     Breath sounds: Normal breath sounds. No stridor. No wheezing, rhonchi or rales.  Chest:     Chest wall: No tenderness.  Abdominal:     General: Abdomen is flat. Bowel sounds are normal. There is no distension.     Palpations: Abdomen is soft. There is no mass.     Tenderness: There is no abdominal tenderness. There is no right CVA tenderness, left CVA tenderness, guarding or rebound.     Hernia: No hernia is present.  Genitourinary:    Comments: Genital exam deferred with shared decision making Musculoskeletal:        General: No swelling, tenderness, deformity or signs of injury.     Right lower leg: No edema.     Left lower leg: No edema.  Lymphadenopathy:  Cervical: No cervical adenopathy.  Skin:    General: Skin is warm and dry.     Capillary Refill: Capillary refill takes less than 2 seconds.     Coloration: Skin is not jaundiced or pale.     Findings: No bruising, erythema, lesion or rash.  Neurological:     General: No focal deficit present.     Mental Status: He is alert and oriented to person, place, and time.     Cranial Nerves: No cranial nerve deficit.     Sensory: No sensory deficit.     Motor: No weakness.     Coordination: Coordination  normal.     Gait: Gait normal.     Deep Tendon Reflexes: Reflexes normal.  Psychiatric:        Mood and Affect: Mood is anxious.        Behavior: Behavior normal.        Thought Content: Thought content normal.        Judgment: Judgment normal.     Results for orders placed or performed in visit on 12/13/17  Microscopic Examination  Result Value Ref Range   WBC, UA 0-5 0 - 5 /hpf   RBC, UA 0-2 0 - 2 /hpf   Epithelial Cells (non renal) CANCELED    Bacteria, UA None seen None seen/Few  CBC with Differential/Platelet  Result Value Ref Range   WBC 7.5 3.4 - 10.8 x10E3/uL   RBC 5.71 4.14 - 5.80 x10E6/uL   Hemoglobin 18.3 (H) 13.0 - 17.7 g/dL   Hematocrit 16.153.3 (H) 09.637.5 - 51.0 %   MCV 93 79 - 97 fL   MCH 32.0 26.6 - 33.0 pg   MCHC 34.3 31.5 - 35.7 g/dL   RDW 04.514.1 40.912.3 - 81.115.4 %   Platelets 232 150 - 379 x10E3/uL   Neutrophils 63 Not Estab. %   Lymphs 25 Not Estab. %   Monocytes 9 Not Estab. %   Eos 2 Not Estab. %   Basos 1 Not Estab. %   Neutrophils Absolute 4.8 1.4 - 7.0 x10E3/uL   Lymphocytes Absolute 1.9 0.7 - 3.1 x10E3/uL   Monocytes Absolute 0.6 0.1 - 0.9 x10E3/uL   EOS (ABSOLUTE) 0.2 0.0 - 0.4 x10E3/uL   Basophils Absolute 0.0 0.0 - 0.2 x10E3/uL   Immature Granulocytes 0 Not Estab. %   Immature Grans (Abs) 0.0 0.0 - 0.1 x10E3/uL  Comprehensive metabolic panel  Result Value Ref Range   Glucose 95 65 - 99 mg/dL   BUN 13 6 - 24 mg/dL   Creatinine, Ser 9.141.12 0.76 - 1.27 mg/dL   GFR calc non Af Amer 76 >59 mL/min/1.73   GFR calc Af Amer 87 >59 mL/min/1.73   BUN/Creatinine Ratio 12 9 - 20   Sodium 142 134 - 144 mmol/L   Potassium 4.3 3.5 - 5.2 mmol/L   Chloride 103 96 - 106 mmol/L   CO2 24 20 - 29 mmol/L   Calcium 9.3 8.7 - 10.2 mg/dL   Total Protein 6.5 6.0 - 8.5 g/dL   Albumin 4.2 3.5 - 5.5 g/dL   Globulin, Total 2.3 1.5 - 4.5 g/dL   Albumin/Globulin Ratio 1.8 1.2 - 2.2   Bilirubin Total 0.2 0.0 - 1.2 mg/dL   Alkaline Phosphatase 90 39 - 117 IU/L   AST 21 0 - 40  IU/L   ALT 23 0 - 44 IU/L  Lipid Panel w/o Chol/HDL Ratio  Result Value Ref Range   Cholesterol, Total 235 (H) 100 - 199  mg/dL   Triglycerides 161 (H) 0 - 149 mg/dL   HDL 37 (L) >09 mg/dL   VLDL Cholesterol Cal 60 (H) 5 - 40 mg/dL   LDL Calculated 604 (H) 0 - 99 mg/dL  PSA  Result Value Ref Range   Prostate Specific Ag, Serum 0.9 0.0 - 4.0 ng/mL  UA/M w/rflx Culture, Routine  Result Value Ref Range   Specific Gravity, UA 1.025 1.005 - 1.030   pH, UA 6.0 5.0 - 7.5   Color, UA Yellow Yellow   Appearance Ur Clear Clear   Leukocytes, UA Negative Negative   Protein, UA 2+ (A) Negative/Trace   Glucose, UA Negative Negative   Ketones, UA Negative Negative   RBC, UA 1+ (A) Negative   Bilirubin, UA Negative Negative   Urobilinogen, Ur 0.2 0.2 - 1.0 mg/dL   Nitrite, UA Negative Negative   Microscopic Examination See below:   Thyroid Panel With TSH  Result Value Ref Range   TSH 1.820 0.450 - 4.500 uIU/mL   T4, Total 7.3 4.5 - 12.0 ug/dL   T3 Uptake Ratio 28 24 - 39 %   Free Thyroxine Index 2.0 1.2 - 4.9  Hgb A1c w/o eAG  Result Value Ref Range   Hgb A1c MFr Bld 5.6 4.8 - 5.6 %      Assessment & Plan:   Problem List Items Addressed This Visit      Cardiovascular and Mediastinum   Hypertension    Having trouble remembering to take his metoprolol BID. Will change to ER. Call with any concerns.       Relevant Medications   metoprolol succinate (TOPROL-XL) 25 MG 24 hr tablet   Other Relevant Orders   CBC with Differential/Platelet   Comprehensive metabolic panel   UA/M w/rflx Culture, Routine     Genitourinary   BPH (benign prostatic hyperplasia)    Checking PSA. Start flomax. Call with any concerns.       Relevant Medications   tamsulosin (FLOMAX) 0.4 MG CAPS capsule     Other   Panic disorder with agoraphobia    Not feeling significantly better, but his numbers are looking better. Has not reached out to the trauma therapist. Does not want to change his medication  right now. Continue current regimen. Recheck 3 months. Call with any concerns.       Relevant Medications   venlafaxine XR (EFFEXOR XR) 75 MG 24 hr capsule   venlafaxine XR (EFFEXOR-XR) 150 MG 24 hr capsule   Other Relevant Orders   CBC with Differential/Platelet   Comprehensive metabolic panel   TSH   UA/M w/rflx Culture, Routine   Hyperlipidemia    Rechecking levels today. Await results. Call with any concerns.       Relevant Medications   metoprolol succinate (TOPROL-XL) 25 MG 24 hr tablet   Other Relevant Orders   CBC with Differential/Platelet   Comprehensive metabolic panel   Lipid Panel w/o Chol/HDL Ratio   UA/M w/rflx Culture, Routine    Other Visit Diagnoses    Routine general medical examination at a health care facility    -  Primary   Vaccines declined. Screening labs checked today. Colonoscopy declined. Call with any concerns. Continue to monitor.    Relevant Orders   CBC with Differential/Platelet   Comprehensive metabolic panel   Lipid Panel w/o Chol/HDL Ratio   PSA   TSH   UA/M w/rflx Culture, Routine      LABORATORY TESTING:  Health maintenance labs ordered today as  discussed above.   The natural history of prostate cancer and ongoing controversy regarding screening and potential treatment outcomes of prostate cancer has been discussed with the patient. The meaning of a false positive PSA and a false negative PSA has been discussed. He indicates understanding of the limitations of this screening test and wishes to proceed with screening PSA testing.   IMMUNIZATIONS:   - Tdap: Tetanus vaccination status reviewed: Refused. - Influenza: Refused - Pneumovax: Refused - Prevnar: Not applicable  SCREENING: - Colonoscopy: Refused  Discussed with patient purpose of the colonoscopy is to detect colon cancer at curable precancerous or early stages    PATIENT COUNSELING:    Sexuality: Discussed sexually transmitted diseases, partner selection, use of  condoms, avoidance of unintended pregnancy  and contraceptive alternatives.   Advised to avoid cigarette smoking.  I discussed with the patient that most people either abstain from alcohol or drink within safe limits (<=14/week and <=4 drinks/occasion for males, <=7/weeks and <= 3 drinks/occasion for females) and that the risk for alcohol disorders and other health effects rises proportionally with the number of drinks per week and how often a drinker exceeds daily limits.  Discussed cessation/primary prevention of drug use and availability of treatment for abuse.   Diet: Encouraged to adjust caloric intake to maintain  or achieve ideal body weight, to reduce intake of dietary saturated fat and total fat, to limit sodium intake by avoiding high sodium foods and not adding table salt, and to maintain adequate dietary potassium and calcium preferably from fresh fruits, vegetables, and low-fat dairy products.    stressed the importance of regular exercise  Injury prevention: Discussed safety belts, safety helmets, smoke detector, smoking near bedding or upholstery.   Dental health: Discussed importance of regular tooth brushing, flossing, and dental visits.   Follow up plan: NEXT PREVENTATIVE PHYSICAL DUE IN 1 YEAR. Return in about 3 months (around 01/04/2019) for follow up.

## 2018-10-06 NOTE — Assessment & Plan Note (Signed)
Rechecking levels today. Await results. Call with any concerns.  

## 2018-10-06 NOTE — Patient Instructions (Signed)

## 2018-10-06 NOTE — Assessment & Plan Note (Signed)
Not feeling significantly better, but his numbers are looking better. Has not reached out to the trauma therapist. Does not want to change his medication right now. Continue current regimen. Recheck 3 months. Call with any concerns.

## 2018-10-07 LAB — TSH: TSH: 1.29 u[IU]/mL (ref 0.450–4.500)

## 2018-10-07 LAB — CBC WITH DIFFERENTIAL/PLATELET
Basophils Absolute: 0.1 10*3/uL (ref 0.0–0.2)
Basos: 1 %
EOS (ABSOLUTE): 0.2 10*3/uL (ref 0.0–0.4)
Eos: 3 %
Hematocrit: 47.7 % (ref 37.5–51.0)
Hemoglobin: 17.1 g/dL (ref 13.0–17.7)
Immature Grans (Abs): 0 10*3/uL (ref 0.0–0.1)
Immature Granulocytes: 0 %
Lymphocytes Absolute: 1.9 10*3/uL (ref 0.7–3.1)
Lymphs: 29 %
MCH: 32.3 pg (ref 26.6–33.0)
MCHC: 35.8 g/dL — ABNORMAL HIGH (ref 31.5–35.7)
MCV: 90 fL (ref 79–97)
MONOS ABS: 0.7 10*3/uL (ref 0.1–0.9)
Monocytes: 10 %
Neutrophils Absolute: 3.9 10*3/uL (ref 1.4–7.0)
Neutrophils: 57 %
PLATELETS: 243 10*3/uL (ref 150–450)
RBC: 5.29 x10E6/uL (ref 4.14–5.80)
RDW: 13 % (ref 11.6–15.4)
WBC: 6.7 10*3/uL (ref 3.4–10.8)

## 2018-10-07 LAB — COMPREHENSIVE METABOLIC PANEL
ALT: 19 IU/L (ref 0–44)
AST: 14 IU/L (ref 0–40)
Albumin/Globulin Ratio: 1.6 (ref 1.2–2.2)
Albumin: 4 g/dL (ref 3.8–4.9)
Alkaline Phosphatase: 85 IU/L (ref 39–117)
BUN/Creatinine Ratio: 12 (ref 9–20)
BUN: 15 mg/dL (ref 6–24)
Bilirubin Total: 0.4 mg/dL (ref 0.0–1.2)
CO2: 19 mmol/L — ABNORMAL LOW (ref 20–29)
Calcium: 9.3 mg/dL (ref 8.7–10.2)
Chloride: 106 mmol/L (ref 96–106)
Creatinine, Ser: 1.25 mg/dL (ref 0.76–1.27)
GFR calc Af Amer: 76 mL/min/{1.73_m2} (ref >59)
GFR calc non Af Amer: 66 mL/min/{1.73_m2} (ref >59)
GLUCOSE: 109 mg/dL — AB (ref 65–99)
Globulin, Total: 2.5 g/dL (ref 1.5–4.5)
Potassium: 4.1 mmol/L (ref 3.5–5.2)
Sodium: 139 mmol/L (ref 134–144)
Total Protein: 6.5 g/dL (ref 6.0–8.5)

## 2018-10-07 LAB — PSA: Prostate Specific Ag, Serum: 0.8 ng/mL (ref 0.0–4.0)

## 2018-10-07 LAB — LIPID PANEL W/O CHOL/HDL RATIO
Cholesterol, Total: 245 mg/dL — ABNORMAL HIGH (ref 100–199)
HDL: 36 mg/dL — ABNORMAL LOW (ref >39)
LDL Calculated: 161 mg/dL — ABNORMAL HIGH (ref 0–99)
Triglycerides: 239 mg/dL — ABNORMAL HIGH (ref 0–149)
VLDL Cholesterol Cal: 48 mg/dL — ABNORMAL HIGH (ref 5–40)

## 2018-10-10 ENCOUNTER — Encounter: Payer: Self-pay | Admitting: Family Medicine

## 2018-11-14 DIAGNOSIS — I10 Essential (primary) hypertension: Secondary | ICD-10-CM | POA: Diagnosis not present

## 2018-11-14 DIAGNOSIS — Z720 Tobacco use: Secondary | ICD-10-CM | POA: Diagnosis not present

## 2018-11-14 DIAGNOSIS — R002 Palpitations: Secondary | ICD-10-CM | POA: Diagnosis not present

## 2018-11-14 DIAGNOSIS — E782 Mixed hyperlipidemia: Secondary | ICD-10-CM | POA: Diagnosis not present

## 2019-01-05 ENCOUNTER — Ambulatory Visit: Payer: BLUE CROSS/BLUE SHIELD | Admitting: Family Medicine

## 2019-02-08 ENCOUNTER — Ambulatory Visit: Payer: BLUE CROSS/BLUE SHIELD | Admitting: Family Medicine

## 2019-05-15 DIAGNOSIS — E782 Mixed hyperlipidemia: Secondary | ICD-10-CM | POA: Diagnosis not present

## 2019-05-15 DIAGNOSIS — I10 Essential (primary) hypertension: Secondary | ICD-10-CM | POA: Diagnosis not present

## 2019-05-15 DIAGNOSIS — R002 Palpitations: Secondary | ICD-10-CM | POA: Diagnosis not present

## 2019-05-15 DIAGNOSIS — Z720 Tobacco use: Secondary | ICD-10-CM | POA: Diagnosis not present

## 2019-07-16 ENCOUNTER — Other Ambulatory Visit: Payer: Self-pay | Admitting: Family Medicine

## 2019-07-16 NOTE — Telephone Encounter (Signed)
Requested medication (s) are due for refill today: yes  Requested medication (s) are on the active medication list: yes  Last refill:  01/08/2019  Future visit scheduled: no  Notes to clinic:  Overdue for office visit  Review for refill   Requested Prescriptions  Pending Prescriptions Disp Refills   tamsulosin (FLOMAX) 0.4 MG CAPS capsule [Pharmacy Med Name: TAMSULOSIN HCL 0.4 MG CAPSULE] 90 capsule 0    Sig: Take 1 capsule (0.4 mg total) by mouth daily.     Urology: Alpha-Adrenergic Blocker Failed - 07/16/2019  9:08 AM      Failed - Last BP in normal range    BP Readings from Last 1 Encounters:  10/06/18 (!) 138/93         Passed - Valid encounter within last 12 months    Recent Outpatient Visits          9 months ago Routine general medical examination at a health care facility   [facility], [place] P, DO   10 months ago Panic disorder with agoraphobia   [facility] [place], Megan P, DO   1 year ago Panic disorder with agoraphobia   [facility] [place], Megan P, DO   1 year ago Palpitations   [place], Megan P, DO   4 years ago Panic disorder with agoraphobia   [facility] Lada, Satira Anis, MD              venlafaxine XR (EFFEXOR-XR) 150 MG 24 hr capsule [Pharmacy Med Name: VENLAFAXINE HCL ER 150 MG CAP] 90 capsule 0    Sig: Take 1 capsule (150 mg total) by mouth daily with breakfast. Needs appointment for further refills     Psychiatry: Antidepressants - SNRI - desvenlafaxine & venlafaxine Failed - 07/16/2019  9:08 AM      Failed - LDL in normal range and within 360 days    LDL Calculated  Date Value Ref Range Status  10/06/2018 161 (H) 0 - 99 mg/dL Final         Failed - Total Cholesterol in normal range and within 360 days    Cholesterol, Total  Date Value Ref Range Status  10/06/2018 245 (H) 100 - 199 mg/dL Final         Failed - Triglycerides in normal range and within  360 days    Triglycerides  Date Value Ref Range Status  10/06/2018 239 (H) 0 - 149 mg/dL Final         Failed - Last BP in normal range    BP Readings from Last 1 Encounters:  10/06/18 (!) 138/93         Failed - Valid encounter within last 6 months    Recent Outpatient Visits          9 months ago Routine general medical examination at a health care facility   [facility], [place] P, DO   10 months ago Panic disorder with agoraphobia   [facility] [place], Megan P, DO   1 year ago Panic disorder with agoraphobia   [facility] [place], [place], DO   1 year ago Palpitations   [place], Megan P, DO   4 years ago Panic disorder with agoraphobia   [facility] Lada, Satira Anis, MD             Failed - Completed PHQ-2 or PHQ-9 in the last 360 days.

## 2019-07-16 NOTE — Telephone Encounter (Signed)
Routing to provider  

## 2019-07-17 NOTE — Telephone Encounter (Signed)
Called pt let him know that prescription was sent to pharmacy pt understood

## 2019-07-17 NOTE — Telephone Encounter (Signed)
Needs follow-up

## 2019-07-17 NOTE — Telephone Encounter (Signed)
Called pt scheduled him for 08/02/2019.

## 2019-08-01 IMAGING — CR DG CHEST 2V
2 series · 2 of 2 positions shown · non-contrast
Comparison: Radiographs July 04, 2016.

CLINICAL DATA: Chest pain.

EXAM:
CHEST - 2 VIEW

[chest pa]
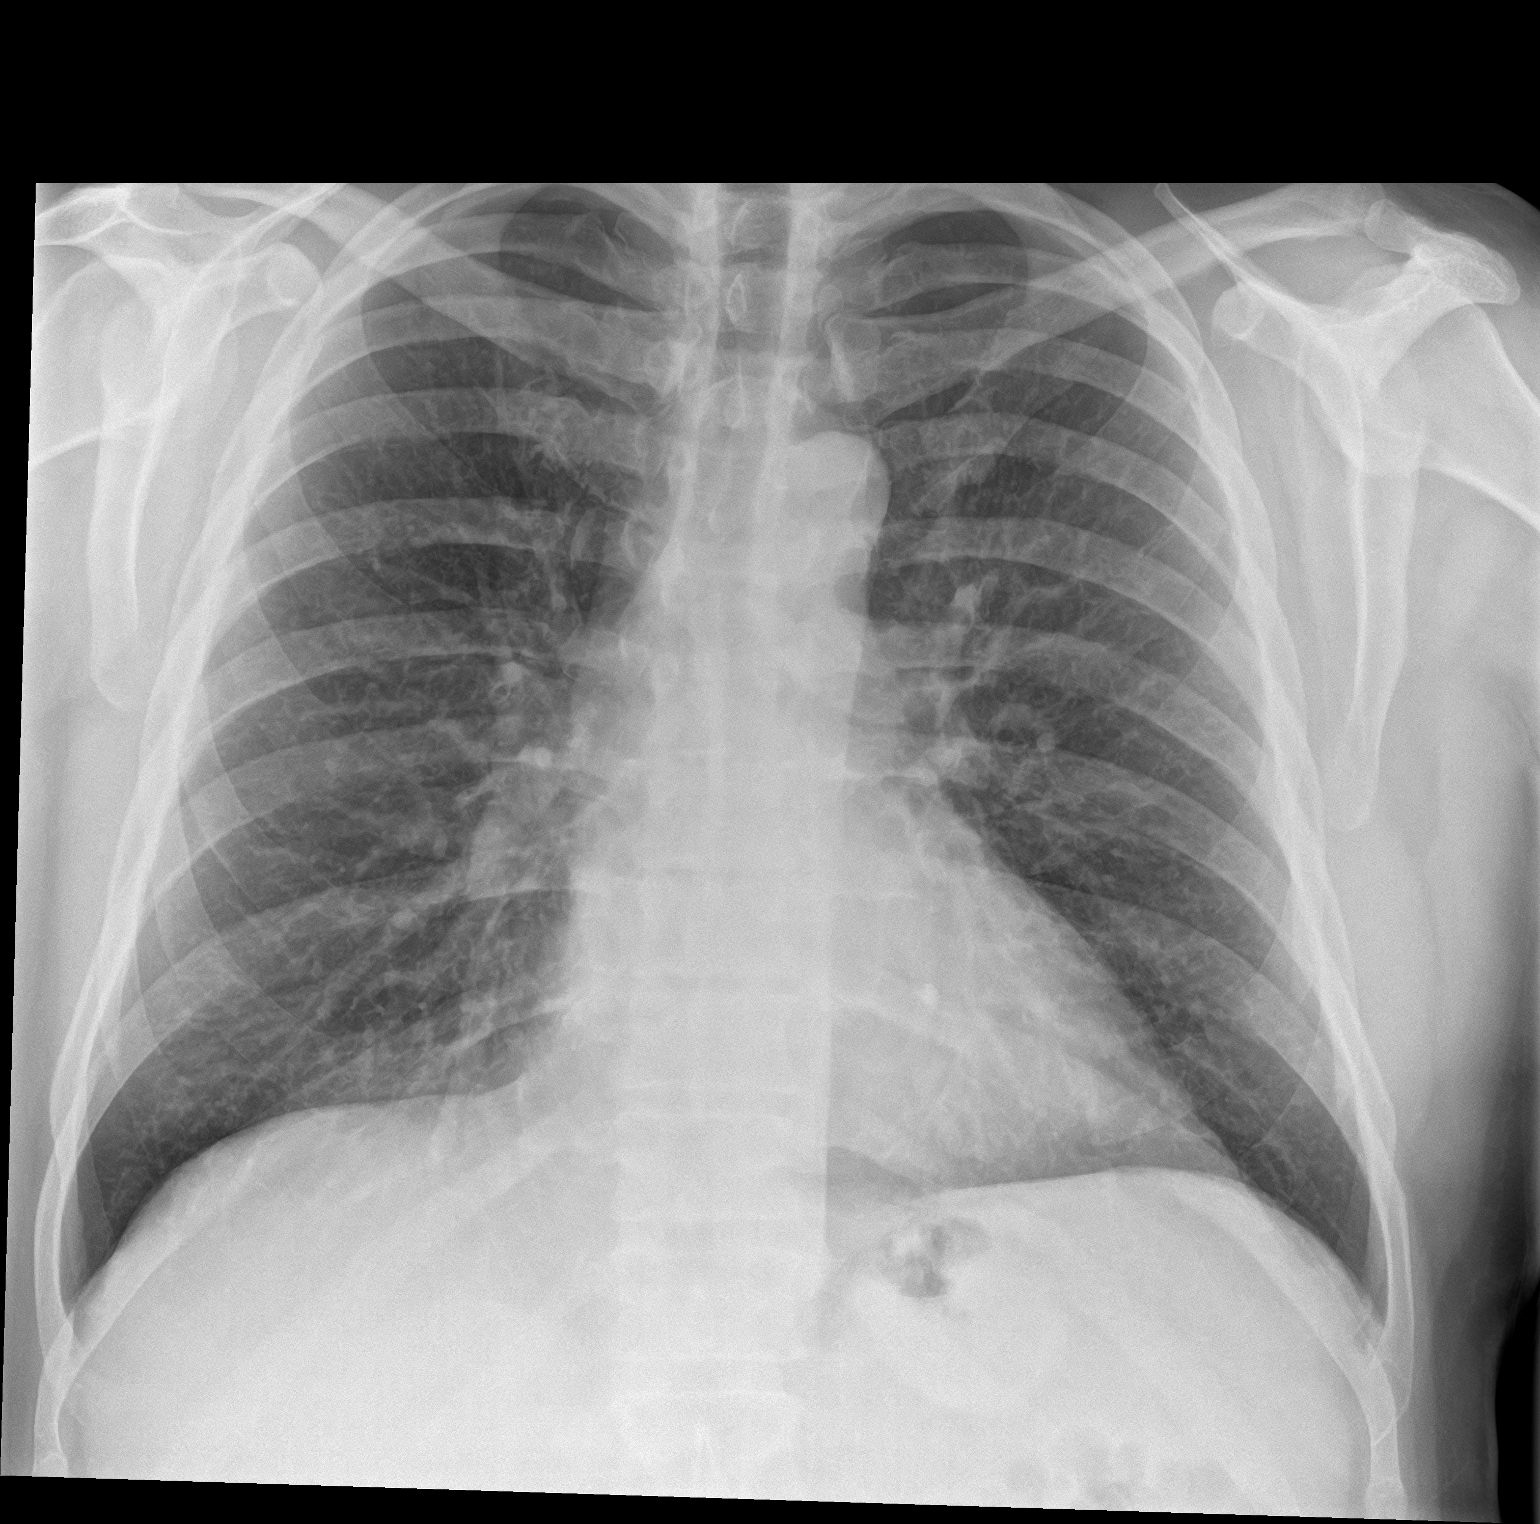

[chest lat]
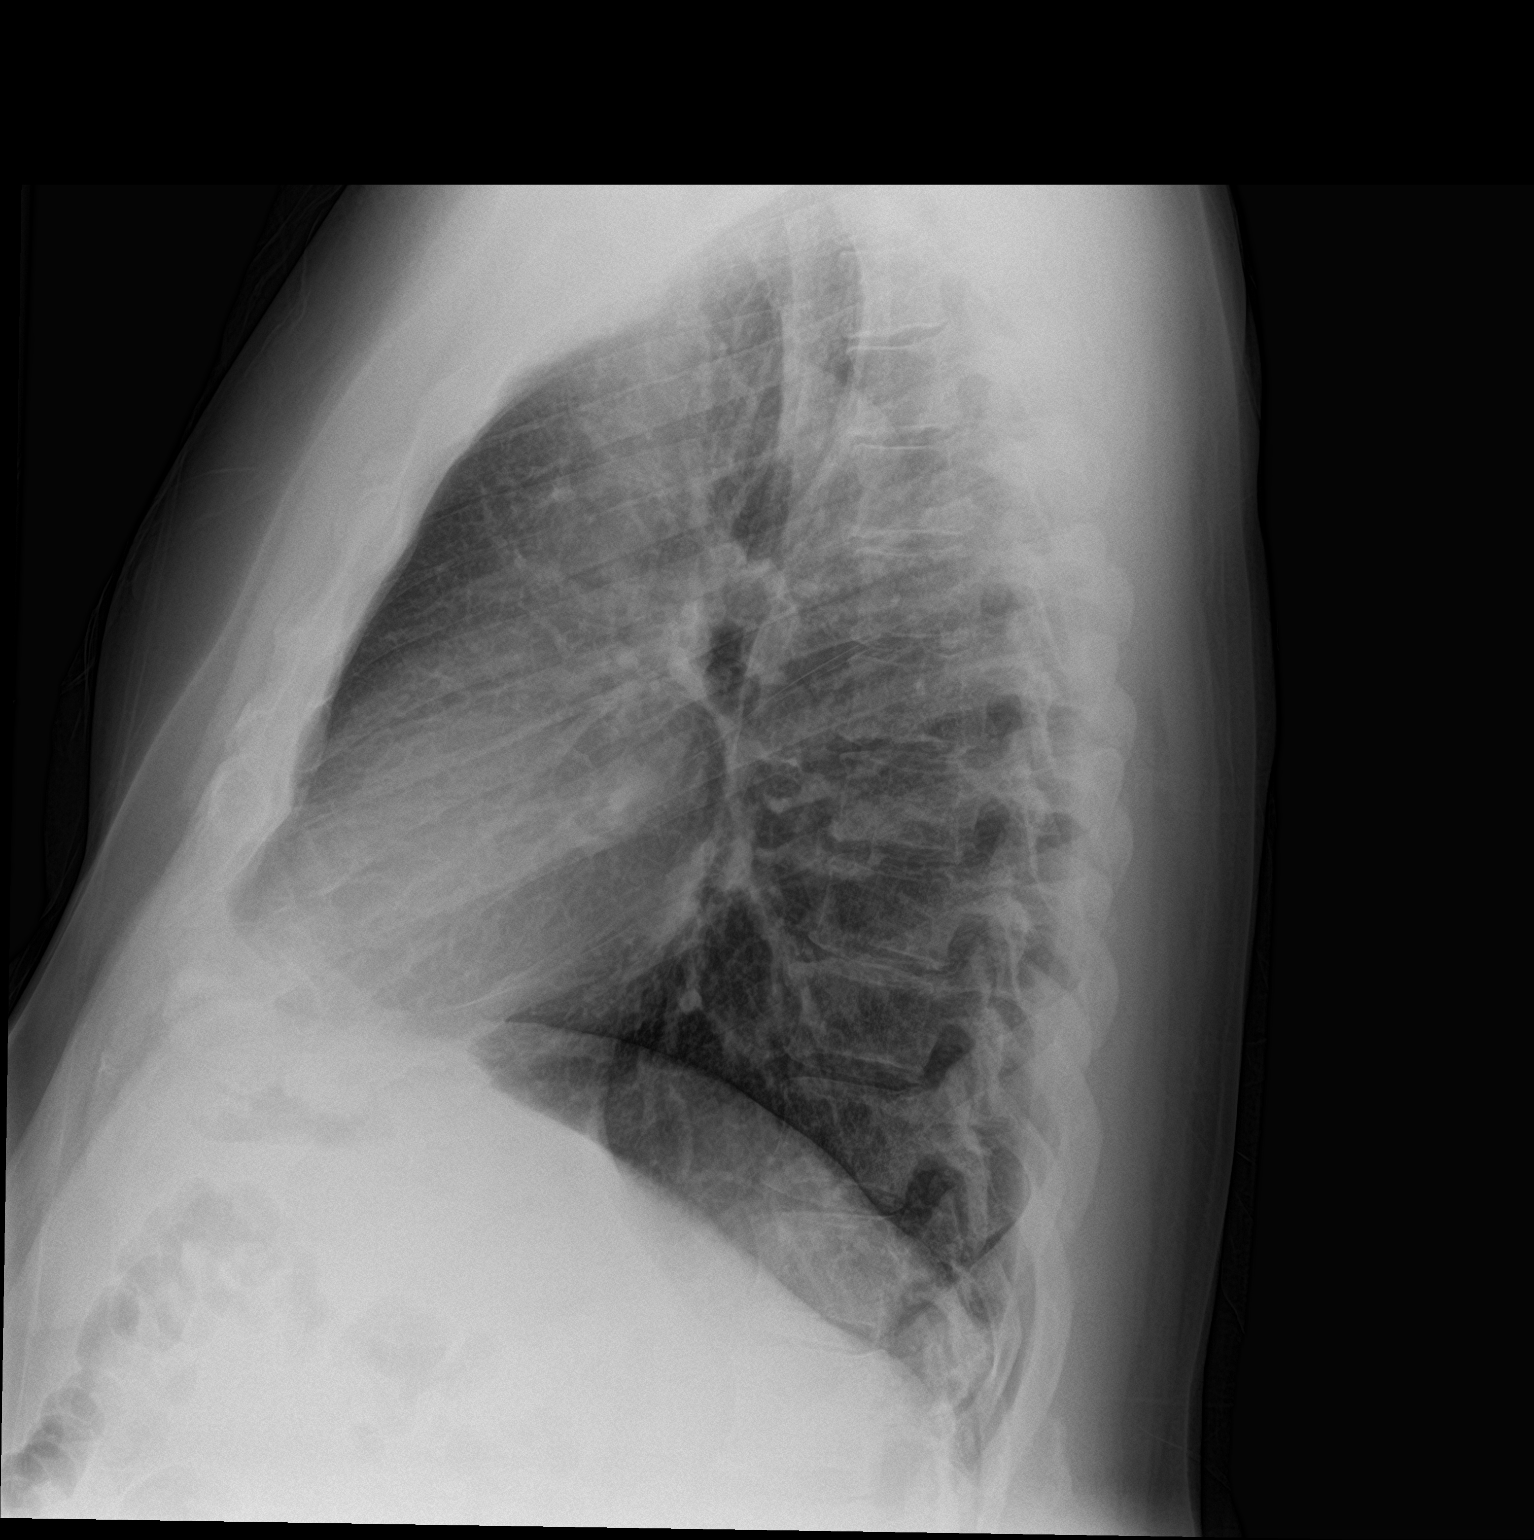

[2 of 2 positions shown; findings below may reference images not displayed]

FINDINGS: The heart size and mediastinal contours are within normal limits.
Both lungs are clear. No pneumothorax or pleural effusion is noted.
The visualized skeletal structures are unremarkable.
IMPRESSION: No active cardiopulmonary disease.

## 2019-08-02 ENCOUNTER — Ambulatory Visit (INDEPENDENT_AMBULATORY_CARE_PROVIDER_SITE_OTHER): Payer: BC Managed Care – PPO | Admitting: Family Medicine

## 2019-08-02 ENCOUNTER — Other Ambulatory Visit: Payer: Self-pay

## 2019-08-02 ENCOUNTER — Encounter: Payer: Self-pay | Admitting: Family Medicine

## 2019-08-02 VITALS — Wt 281.0 lb

## 2019-08-02 DIAGNOSIS — N401 Enlarged prostate with lower urinary tract symptoms: Secondary | ICD-10-CM

## 2019-08-02 DIAGNOSIS — F4001 Agoraphobia with panic disorder: Secondary | ICD-10-CM | POA: Diagnosis not present

## 2019-08-02 DIAGNOSIS — H538 Other visual disturbances: Secondary | ICD-10-CM

## 2019-08-02 DIAGNOSIS — E782 Mixed hyperlipidemia: Secondary | ICD-10-CM | POA: Diagnosis not present

## 2019-08-02 DIAGNOSIS — I10 Essential (primary) hypertension: Secondary | ICD-10-CM

## 2019-08-02 DIAGNOSIS — R3914 Feeling of incomplete bladder emptying: Secondary | ICD-10-CM

## 2019-08-02 MED ORDER — VENLAFAXINE HCL ER 150 MG PO CP24: 150.0000 mg | ORAL_CAPSULE | Freq: Every day | ORAL | 1 refills | Status: DC

## 2019-08-02 MED ORDER — TAMSULOSIN HCL 0.4 MG PO CAPS: 0.4000 mg | ORAL_CAPSULE | Freq: Every day | ORAL | 1 refills | Status: DC

## 2019-08-02 MED ORDER — METOPROLOL SUCCINATE ER 25 MG PO TB24: 25.0000 mg | ORAL_TABLET | Freq: Every day | ORAL | 1 refills | Status: DC

## 2019-08-02 MED ORDER — VENLAFAXINE HCL ER 75 MG PO CP24: 75.0000 mg | ORAL_CAPSULE | Freq: Every day | ORAL | 1 refills | Status: DC

## 2019-08-02 NOTE — Assessment & Plan Note (Signed)
Has been off his 75mg  of effexor for a bit- feeling worse. Will restart it. Call with any concerns. Recheck 6 months.

## 2019-08-02 NOTE — Assessment & Plan Note (Signed)
Has put on about 20lbs- will check labs and treat as needed. Await results. Call with any concerns.

## 2019-08-02 NOTE — Progress Notes (Signed)
Wt 281 lb (127.5 kg)   BMI 40.32 kg/m    Subjective:    Patient ID: Brady Edwards, male    DOB: Jul 10, 1966, 53 y.o.   MRN: [id number]  HPI: Brady Edwards is a 53 y.o. male  Chief Complaint  Patient presents with  . Anxiety  . Hypertension  . Benign Prostatic Hypertrophy   ANXIETY/STRESS Duration:stable Anxious mood: yes  Excessive worrying: yes Irritability: no  Sweating: no Nausea: no Palpitations:no Hyperventilation: no Panic attacks: yes Agoraphobia: no  Obscessions/compulsions: no Depressed mood: no Depression screen [facility] 2/9 08/02/2019 10/06/2018 08/29/2018 01/05/2018 12/13/2017  Decreased Interest '3 1 3 1 3  ' Down, Depressed, Hopeless 0 '1 2 1 3  ' PHQ - 2 Score '3 2 5 2 6  ' Altered sleeping 0 '1 1 1 2  ' Tired, decreased energy '3 1 2 1 3  ' Change in appetite '3 1 3 1 3  ' Feeling bad or failure about yourself  '3 1 2 ' 0 2  Trouble concentrating 0 0 0 1 2  Moving slowly or fidgety/restless 0 0 0 0 0  Suicidal thoughts 0 0 0 0 0  PHQ-9 Score '12 6 13 6 18  ' Difficult doing work/chores Somewhat difficult Very difficult Very difficult - -   Anhedonia: no Weight changes: no Insomnia: no   Hypersomnia: no Fatigue/loss of energy: yes Feelings of worthlessness: no Feelings of guilt: no Impaired concentration/indecisiveness: no Suicidal ideations: no  Crying spells: no Recent Stressors/Life Changes: yes   Relationship problems: no   Family stress: no     Financial stress: no    Job stress: no    Recent death/loss: no  HYPERTENSION / HYPERLIPIDEMIA Satisfied with current treatment? yes Duration of hypertension: chronic BP monitoring frequency: not checking BP medication side effects: no Past BP meds: metoprolol Duration of hyperlipidemia: chronic Cholesterol medication side effects: not on anything Cholesterol supplements: none Past cholesterol medications: none Medication compliance: good compliance Aspirin: no Recent stressors: yes Recurrent headaches: no  Visual changes: no Palpitations: yes Dyspnea: no Chest pain: no Lower extremity edema: no Dizzy/lightheaded: no  BPH BPH status: controlled Satisfied with current treatment?: no Medication side effects: no Medication compliance: excellent compliance Duration: chronic Nocturia: no Urinary frequency:no Incomplete voiding: no Urgency: no Weak urinary stream: no Straining to start stream: no Dysuria: no Onset: gradual Severity: mild   Relevant past medical, surgical, family and social history reviewed and updated as indicated. Interim medical history since our last visit reviewed. Allergies and medications reviewed and updated.  Review of Systems  Constitutional: Negative.   Eyes: Positive for visual disturbance (feels like his eyes don't catch up to his head right). Negative for photophobia, pain, discharge, redness and itching.  Respiratory: Negative.   Cardiovascular: Negative.   Musculoskeletal: Negative.   Skin: Negative.   Neurological: Negative.   Psychiatric/Behavioral: Positive for dysphoric mood. Negative for agitation, behavioral problems, confusion, decreased concentration, hallucinations, self-injury, sleep disturbance and suicidal ideas. The patient is nervous/anxious. The patient is not hyperactive.     Per HPI unless specifically indicated above     Objective:    Wt 281 lb (127.5 kg)   BMI 40.32 kg/m   Wt Readings from Last 3 Encounters:  08/02/19 281 lb (127.5 kg)  10/06/18 259 lb (117.5 kg)  08/29/18 260 lb (117.9 kg)    Physical Exam Vitals signs and nursing note reviewed.  Constitutional:      General: He is not in acute distress.    Appearance: Normal appearance. He  is not ill-appearing, toxic-appearing or diaphoretic.  HENT:     Head: Normocephalic and atraumatic.     Right Ear: External ear normal.     Left Ear: External ear normal.     Nose: Nose normal.     Mouth/Throat:     Mouth: Mucous membranes are moist.     Pharynx: Oropharynx  is clear.  Eyes:     General: No scleral icterus.       Right eye: No discharge.        Left eye: No discharge.     Conjunctiva/sclera: Conjunctivae normal.     Pupils: Pupils are equal, round, and reactive to light.  Neck:     Musculoskeletal: Normal range of motion.  Pulmonary:     Effort: Pulmonary effort is normal. No respiratory distress.     Comments: Speaking in full sentences Musculoskeletal: Normal range of motion.  Skin:    Coloration: Skin is not jaundiced or pale.     Findings: No bruising, erythema, lesion or rash.  Neurological:     Mental Status: He is alert and oriented to person, place, and time. Mental status is at baseline.  Psychiatric:        Mood and Affect: Mood normal.        Behavior: Behavior normal.        Thought Content: Thought content normal.        Judgment: Judgment normal.     Results for orders placed or performed in visit on 10/06/18  CBC with Differential/Platelet  Result Value Ref Range   WBC 6.7 3.4 - 10.8 x10E3/uL   RBC 5.29 4.14 - 5.80 x10E6/uL   Hemoglobin 17.1 13.0 - 17.7 g/dL   Hematocrit 47.7 37.5 - 51.0 %   MCV 90 79 - 97 fL   MCH 32.3 26.6 - 33.0 pg   MCHC 35.8 (H) 31.5 - 35.7 g/dL   RDW 13.0 11.6 - 15.4 %   Platelets 243 150 - 450 x10E3/uL   Neutrophils 57 Not Estab. %   Lymphs 29 Not Estab. %   Monocytes 10 Not Estab. %   Eos 3 Not Estab. %   Basos 1 Not Estab. %   Neutrophils Absolute 3.9 1.4 - 7.0 x10E3/uL   Lymphocytes Absolute 1.9 0.7 - 3.1 x10E3/uL   Monocytes Absolute 0.7 0.1 - 0.9 x10E3/uL   EOS (ABSOLUTE) 0.2 0.0 - 0.4 x10E3/uL   Basophils Absolute 0.1 0.0 - 0.2 x10E3/uL   Immature Granulocytes 0 Not Estab. %   Immature Grans (Abs) 0.0 0.0 - 0.1 x10E3/uL  Comprehensive metabolic panel  Result Value Ref Range   Glucose 109 (H) 65 - 99 mg/dL   BUN 15 6 - 24 mg/dL   Creatinine, Ser 1.25 0.76 - 1.27 mg/dL   GFR calc non Af Amer 66 >59 mL/min/1.73   GFR calc Af Amer 76 >59 mL/min/1.73   BUN/Creatinine Ratio  12 9 - 20   Sodium 139 134 - 144 mmol/L   Potassium 4.1 3.5 - 5.2 mmol/L   Chloride 106 96 - 106 mmol/L   CO2 19 (L) 20 - 29 mmol/L   Calcium 9.3 8.7 - 10.2 mg/dL   Total Protein 6.5 6.0 - 8.5 g/dL   Albumin 4.0 3.8 - 4.9 g/dL   Globulin, Total 2.5 1.5 - 4.5 g/dL   Albumin/Globulin Ratio 1.6 1.2 - 2.2   Bilirubin Total 0.4 0.0 - 1.2 mg/dL   Alkaline Phosphatase 85 39 - 117 IU/L   AST 14  0 - 40 IU/L   ALT 19 0 - 44 IU/L  Lipid Panel w/o Chol/HDL Ratio  Result Value Ref Range   Cholesterol, Total 245 (H) 100 - 199 mg/dL   Triglycerides 239 (H) 0 - 149 mg/dL   HDL 36 (L) >39 mg/dL   VLDL Cholesterol Cal 48 (H) 5 - 40 mg/dL   LDL Calculated 161 (H) 0 - 99 mg/dL  PSA  Result Value Ref Range   Prostate Specific Ag, Serum 0.8 0.0 - 4.0 ng/mL  TSH  Result Value Ref Range   TSH 1.290 0.450 - 4.500 uIU/mL  UA/M w/rflx Culture, Routine   Specimen: Urine   URINE  Result Value Ref Range   Specific Gravity, UA 1.025 1.005 - 1.030   pH, UA 5.5 5.0 - 7.5   Color, UA Yellow Yellow   Appearance Ur Clear Clear   Leukocytes, UA Negative Negative   Protein, UA 2+ (A) Negative/Trace   Glucose, UA Negative Negative   Ketones, UA Negative Negative   RBC, UA 1+ (A) Negative   Bilirubin, UA Negative Negative   Urobilinogen, Ur 0.2 0.2 - 1.0 mg/dL   Nitrite, UA Negative Negative      Assessment & Plan:   Problem List Items Addressed This Visit      Cardiovascular and Mediastinum   Hypertension - Primary    Unclear on what BP is running at- will check when he comes in for labs tomorrow. Await BP and will treat as needed. Call with any concerns.       Relevant Medications   metoprolol succinate (TOPROL-XL) 25 MG 24 hr tablet   Other Relevant Orders   CBC with Differential OUT   Comp Met (CMET)     Genitourinary   BPH (benign prostatic hyperplasia)    Under good control on current regimen. Continue current regimen. Continue to monitor. Call with any concerns. Refills given. Labs drawn  tomorrow.        Relevant Medications   tamsulosin (FLOMAX) 0.4 MG CAPS capsule   Other Relevant Orders   CBC with Differential OUT   Comp Met (CMET)     Other   Panic disorder with agoraphobia    Has been off his 38m of effexor for a bit- feeling worse. Will restart it. Call with any concerns. Recheck 6 months.       Relevant Medications   venlafaxine XR (EFFEXOR XR) 75 MG 24 hr capsule   venlafaxine XR (EFFEXOR-XR) 150 MG 24 hr capsule   Other Relevant Orders   CBC with Differential OUT   Comp Met (CMET)   Hyperlipidemia    Has put on about 20lbs- will check labs and treat as needed. Await results. Call with any concerns.       Relevant Medications   metoprolol succinate (TOPROL-XL) 25 MG 24 hr tablet   Other Relevant Orders   CBC with Differential OUT   Comp Met (CMET)   Lipid Panel w/o Chol/HDL Ratio OUT    Other Visit Diagnoses    Blurred vision       Will get him into opthalmology. Referral generated today.   Relevant Orders   Ambulatory referral to Ophthalmology   CBC with Differential OUT   Comp Met (CMET)       Follow up plan: Return in about 6 months (around 01/31/2020) for Physical.    . This visit was completed via [employer] due to the restrictions of the COVID-19 pandemic. All issues as above were discussed and  addressed. Physical exam was done as above through visual confirmation on [employer]. If it was felt that the patient should be evaluated in the office, they were directed there. The patient verbally consented to this visit. . Location of the patient: home . Location of the provider: work . Those involved with this call:  . Provider: Park Liter, DO . CMA: Tiffany Reel, CMA . Front Desk/Registration: Don Perking  . Time spent on call: 25 minutes with patient face to face via video conference. More than 50% of this time was spent in counseling and coordination of care. 40 minutes total spent in review of patient's record and preparation  of their chart.

## 2019-08-02 NOTE — Assessment & Plan Note (Signed)
Under good control on current regimen. Continue current regimen. Continue to monitor. Call with any concerns. Refills given. Labs drawn tomorrow.

## 2019-08-02 NOTE — Assessment & Plan Note (Signed)
Unclear on what BP is running at- will check when he comes in for labs tomorrow. Await BP and will treat as needed. Call with any concerns.

## 2019-08-03 ENCOUNTER — Other Ambulatory Visit: Payer: Self-pay

## 2019-08-03 ENCOUNTER — Other Ambulatory Visit: Payer: BC Managed Care – PPO

## 2019-08-03 ENCOUNTER — Telehealth: Payer: Self-pay | Admitting: Family Medicine

## 2019-08-03 DIAGNOSIS — F4001 Agoraphobia with panic disorder: Secondary | ICD-10-CM

## 2019-08-03 DIAGNOSIS — E782 Mixed hyperlipidemia: Secondary | ICD-10-CM | POA: Diagnosis not present

## 2019-08-03 DIAGNOSIS — H538 Other visual disturbances: Secondary | ICD-10-CM

## 2019-08-03 DIAGNOSIS — I10 Essential (primary) hypertension: Secondary | ICD-10-CM | POA: Diagnosis not present

## 2019-08-03 DIAGNOSIS — R3914 Feeling of incomplete bladder emptying: Secondary | ICD-10-CM | POA: Diagnosis not present

## 2019-08-03 DIAGNOSIS — N401 Enlarged prostate with lower urinary tract symptoms: Secondary | ICD-10-CM | POA: Diagnosis not present

## 2019-08-03 MED ORDER — METOPROLOL SUCCINATE ER 25 MG PO TB24: 25.0000 mg | ORAL_TABLET | Freq: Two times a day (BID) | ORAL | 1 refills | Status: DC

## 2019-08-03 NOTE — Telephone Encounter (Signed)
Can you check with the pharmacy about what he's talking about? I've sent in a 90 day supply on everything.

## 2019-08-03 NOTE — Telephone Encounter (Signed)
Called pt he states that his cardiologist has him taking metorolol twice a day, per refill showing once daily for 90 days. Pt wants to know if he should be taking 1 or 2. Per pharmacy pt last filled medicine 11/16. Please advise.

## 2019-08-03 NOTE — Telephone Encounter (Signed)
Please confirm dose he has been taking. I'm happy to change the dose pending what he'd been taking

## 2019-08-03 NOTE — Telephone Encounter (Signed)
Pt, would like to know if he can have more pills in his medication bottles since he goes every 2 week to get refills.Please advice.

## 2019-08-03 NOTE — Telephone Encounter (Signed)
Patient states one tablet in the morning and one in the evening.

## 2019-08-03 NOTE — Telephone Encounter (Signed)
Rx sent to her pharmacy 

## 2019-08-03 NOTE — Telephone Encounter (Signed)
Pt called back. Pt states he is taking 2, one in am and 1 in pm of the  metoprolol succinate (TOPROL-XL) 25 MG 24 hr tablet  Pt states that is what his cardiologist had him on.

## 2019-08-04 LAB — COMPREHENSIVE METABOLIC PANEL
ALT: 37 IU/L (ref 0–44)
AST: 25 IU/L (ref 0–40)
Albumin/Globulin Ratio: 1.8 (ref 1.2–2.2)
Albumin: 4.2 g/dL (ref 3.8–4.9)
Alkaline Phosphatase: 108 IU/L (ref 39–117)
BUN/Creatinine Ratio: 13 (ref 9–20)
BUN: 17 mg/dL (ref 6–24)
Bilirubin Total: 0.3 mg/dL (ref 0.0–1.2)
CO2: 23 mmol/L (ref 20–29)
Calcium: 9 mg/dL (ref 8.7–10.2)
Chloride: 104 mmol/L (ref 96–106)
Creatinine, Ser: 1.27 mg/dL (ref 0.76–1.27)
GFR calc Af Amer: 74 mL/min/{1.73_m2} (ref >59)
GFR calc non Af Amer: 64 mL/min/{1.73_m2} (ref >59)
Globulin, Total: 2.4 g/dL (ref 1.5–4.5)
Glucose: 119 mg/dL — ABNORMAL HIGH (ref 65–99)
Potassium: 4.6 mmol/L (ref 3.5–5.2)
Sodium: 141 mmol/L (ref 134–144)
Total Protein: 6.6 g/dL (ref 6.0–8.5)

## 2019-08-04 LAB — CBC WITH DIFFERENTIAL/PLATELET
Basophils Absolute: 0.1 10*3/uL (ref 0.0–0.2)
Basos: 1 %
EOS (ABSOLUTE): 0.3 10*3/uL (ref 0.0–0.4)
Eos: 4 %
Hematocrit: 49.4 % (ref 37.5–51.0)
Hemoglobin: 16.4 g/dL (ref 13.0–17.7)
Immature Grans (Abs): 0 10*3/uL (ref 0.0–0.1)
Immature Granulocytes: 0 %
Lymphocytes Absolute: 1.8 10*3/uL (ref 0.7–3.1)
Lymphs: 23 %
MCH: 31.8 pg (ref 26.6–33.0)
MCHC: 33.2 g/dL (ref 31.5–35.7)
MCV: 96 fL (ref 79–97)
Monocytes Absolute: 0.8 10*3/uL (ref 0.1–0.9)
Monocytes: 10 %
Neutrophils Absolute: 5 10*3/uL (ref 1.4–7.0)
Neutrophils: 62 %
Platelets: 221 10*3/uL (ref 150–450)
RBC: 5.16 x10E6/uL (ref 4.14–5.80)
RDW: 13.8 % (ref 11.6–15.4)
WBC: 8 10*3/uL (ref 3.4–10.8)

## 2019-08-04 LAB — LIPID PANEL W/O CHOL/HDL RATIO
Cholesterol, Total: 227 mg/dL — ABNORMAL HIGH (ref 100–199)
HDL: 34 mg/dL — ABNORMAL LOW (ref >39)
LDL Chol Calc (NIH): 117 mg/dL — ABNORMAL HIGH (ref 0–99)
Triglycerides: 433 mg/dL — ABNORMAL HIGH (ref 0–149)
VLDL Cholesterol Cal: 76 mg/dL — ABNORMAL HIGH (ref 5–40)

## 2019-08-06 ENCOUNTER — Encounter: Payer: Self-pay | Admitting: Family Medicine

## 2020-01-01 ENCOUNTER — Encounter: Payer: Self-pay | Admitting: Family Medicine

## 2020-01-01 ENCOUNTER — Other Ambulatory Visit: Payer: Self-pay

## 2020-01-01 ENCOUNTER — Ambulatory Visit (INDEPENDENT_AMBULATORY_CARE_PROVIDER_SITE_OTHER): Payer: BC Managed Care – PPO | Admitting: Family Medicine

## 2020-01-01 VITALS — BP 148/98 | HR 72 | Temp 98.5°F | Ht 70.18 in | Wt 289.8 lb

## 2020-01-01 DIAGNOSIS — H5711 Ocular pain, right eye: Secondary | ICD-10-CM | POA: Diagnosis not present

## 2020-01-01 DIAGNOSIS — H109 Unspecified conjunctivitis: Secondary | ICD-10-CM | POA: Diagnosis not present

## 2020-01-01 DIAGNOSIS — Z1211 Encounter for screening for malignant neoplasm of colon: Secondary | ICD-10-CM | POA: Diagnosis not present

## 2020-01-01 MED ORDER — ERYTHROMYCIN 5 MG/GM OP OINT: 1.0000 "application " | TOPICAL_OINTMENT | Freq: Three times a day (TID) | OPHTHALMIC | 0 refills | Status: DC

## 2020-01-01 NOTE — Progress Notes (Signed)
BP (!) 148/98 (BP Location: Left Arm, Patient Position: Sitting, Cuff Size: Large)   Pulse 72   Temp 98.5 F (36.9 C) (Oral)   Ht 5' 10.18" (1.783 m)   Wt 289 lb 12.8 oz (131.5 kg)   BMI 41.37 kg/m    Subjective:    Patient ID: Brady Edwards, male    DOB: 02-27-1966, 54 y.o.   MRN: [id number]  HPI: Brady Edwards is a 54 y.o. male  Chief Complaint  Patient presents with  . Eye Problem    itchy and pain. right eye.   . Advice Only    cologuard. will place order.    EYE PAIN Duration: 4 days Involved eye:  right Onset: sudden Severity: mild  Quality: sharp  Foreign body sensation:yes Visual impairment: yes Eye redness: yes Discharge: yes Crusting or matting of eyelids: no Swelling: yes Photophobia: yes Itching: yes Tearing: yes Headache: yes Floaters: no URI symptoms: no Contact lens use: no Close contacts with similar problems: no Eye trauma: no Status: worse Treatments attempted: eye drops, compresses  Relevant past medical, surgical, family and social history reviewed and updated as indicated. Interim medical history since our last visit reviewed. Allergies and medications reviewed and updated.  Review of Systems  Constitutional: Negative.   HENT: Negative.   Eyes: Positive for photophobia, pain, discharge, redness, itching and visual disturbance.  Respiratory: Negative.   Cardiovascular: Negative.   Gastrointestinal: Negative.   Psychiatric/Behavioral: Negative.     Per HPI unless specifically indicated above     Objective:    BP (!) 148/98 (BP Location: Left Arm, Patient Position: Sitting, Cuff Size: Large)   Pulse 72   Temp 98.5 F (36.9 C) (Oral)   Ht 5' 10.18" (1.783 m)   Wt 289 lb 12.8 oz (131.5 kg)   BMI 41.37 kg/m   Wt Readings from Last 3 Encounters:  01/01/20 289 lb 12.8 oz (131.5 kg)  08/02/19 281 lb (127.5 kg)  10/06/18 259 lb (117.5 kg)     Hearing Screening   _0  _1  _2  _3  _4  _5  _6  _7   _8   Right ear:           Left ear:             Visual Acuity Screening   Right eye Left eye Both eyes  Without correction: _9  With correction:       Physical Exam Vitals and nursing note reviewed.  Constitutional:      General: He is not in acute distress.    Appearance: Normal appearance. He is not ill-appearing, toxic-appearing or diaphoretic.  HENT:     Head: Normocephalic and atraumatic.     Right Ear: External ear normal.     Left Ear: External ear normal.     Nose: Nose normal.     Mouth/Throat:     Mouth: Mucous membranes are moist.     Pharynx: Oropharynx is clear.  Eyes:     General: Lids are normal. Vision grossly intact. No scleral icterus.       Right eye: No discharge.        Left eye: No discharge.     Extraocular Movements: Extraocular movements intact.     Right eye: Normal extraocular motion.     Left eye: Normal extraocular motion.     Conjunctiva/sclera:     Right eye: Right conjunctiva is injected. No chemosis, exudate or hemorrhage.    Left eye: Left conjunctiva is injected. No chemosis,  exudate or hemorrhage.    Pupils: Pupils are equal, round, and reactive to light.  Cardiovascular:     Rate and Rhythm: Normal rate and regular rhythm.     Pulses: Normal pulses.     Heart sounds: Normal heart sounds. No murmur. No friction rub. No gallop.   Pulmonary:     Effort: Pulmonary effort is normal. No respiratory distress.     Breath sounds: Normal breath sounds. No stridor. No wheezing, rhonchi or rales.  Chest:     Chest wall: No tenderness.  Musculoskeletal:        General: Normal range of motion.     Cervical back: Normal range of motion and neck supple.  Skin:    General: Skin is warm and dry.     Capillary Refill: Capillary refill takes less than 2 seconds.     Coloration: Skin is not jaundiced or pale.     Findings: No bruising, erythema, lesion or rash.  Neurological:     General: No focal deficit present.     Mental  Status: He is alert and oriented to person, place, and time. Mental status is at baseline.  Psychiatric:        Mood and Affect: Mood normal.        Behavior: Behavior normal.        Thought Content: Thought content normal.        Judgment: Judgment normal.     Results for orders placed or performed in visit on 08/03/19  Lipid Panel w/o Chol/HDL Ratio OUT  Result Value Ref Range   Cholesterol, Total 227 (H) 100 - 199 mg/dL   Triglycerides 433 (H) 0 - 149 mg/dL   HDL 34 (L) >39 mg/dL   VLDL Cholesterol Cal 76 (H) 5 - 40 mg/dL   LDL Chol Calc (NIH) 117 (H) 0 - 99 mg/dL  Comp Met (CMET)  Result Value Ref Range   Glucose 119 (H) 65 - 99 mg/dL   BUN 17 6 - 24 mg/dL   Creatinine, Ser 1.27 0.76 - 1.27 mg/dL   GFR calc non Af Amer 64 >59 mL/min/1.73   GFR calc Af Amer 74 >59 mL/min/1.73   BUN/Creatinine Ratio 13 9 - 20   Sodium 141 134 - 144 mmol/L   Potassium 4.6 3.5 - 5.2 mmol/L   Chloride 104 96 - 106 mmol/L   CO2 23 20 - 29 mmol/L   Calcium 9.0 8.7 - 10.2 mg/dL   Total Protein 6.6 6.0 - 8.5 g/dL   Albumin 4.2 3.8 - 4.9 g/dL   Globulin, Total 2.4 1.5 - 4.5 g/dL   Albumin/Globulin Ratio 1.8 1.2 - 2.2   Bilirubin Total 0.3 0.0 - 1.2 mg/dL   Alkaline Phosphatase 108 39 - 117 IU/L   AST 25 0 - 40 IU/L   ALT 37 0 - 44 IU/L  CBC with Differential OUT  Result Value Ref Range   WBC 8.0 3.4 - 10.8 x10E3/uL   RBC 5.16 4.14 - 5.80 x10E6/uL   Hemoglobin 16.4 13.0 - 17.7 g/dL   Hematocrit 49.4 37.5 - 51.0 %   MCV 96 79 - 97 fL   MCH 31.8 26.6 - 33.0 pg   MCHC 33.2 31.5 - 35.7 g/dL   RDW 13.8 11.6 - 15.4 %   Platelets 221 150 - 450 x10E3/uL   Neutrophils 62 Not Estab. %   Lymphs 23 Not Estab. %   Monocytes 10 Not Estab. %   Eos 4 Not Estab. %  Basos 1 Not Estab. %   Neutrophils Absolute 5.0 1.4 - 7.0 x10E3/uL   Lymphocytes Absolute 1.8 0.7 - 3.1 x10E3/uL   Monocytes Absolute 0.8 0.1 - 0.9 x10E3/uL   EOS (ABSOLUTE) 0.3 0.0 - 0.4 x10E3/uL   Basophils Absolute 0.1 0.0 - 0.2  x10E3/uL   Immature Granulocytes 0 Not Estab. %   Immature Grans (Abs) 0.0 0.0 - 0.1 x10E3/uL      Assessment & Plan:   Problem List Items Addressed This Visit    None    Visit Diagnoses    Acute right eye pain    -  Primary   Will get him into opthlmology ASAP. Will start erythromycin ointment until then. Call with any concerns. Continue to monitor.    Relevant Orders   Ambulatory referral to Ophthalmology   Screening for colon cancer       Cologuard ordered today.   Relevant Orders   Cologuard       Follow up plan: Return if symptoms worsen or fail to improve.

## 2020-01-08 DIAGNOSIS — H109 Unspecified conjunctivitis: Secondary | ICD-10-CM | POA: Diagnosis not present

## 2020-01-09 DIAGNOSIS — H16121 Filamentary keratitis, right eye: Secondary | ICD-10-CM | POA: Diagnosis not present

## 2020-01-11 DIAGNOSIS — H16121 Filamentary keratitis, right eye: Secondary | ICD-10-CM | POA: Diagnosis not present

## 2020-01-14 DIAGNOSIS — H16121 Filamentary keratitis, right eye: Secondary | ICD-10-CM | POA: Diagnosis not present

## 2020-01-16 DIAGNOSIS — H16121 Filamentary keratitis, right eye: Secondary | ICD-10-CM | POA: Diagnosis not present

## 2020-02-16 ENCOUNTER — Other Ambulatory Visit: Payer: Self-pay | Admitting: Family Medicine

## 2020-02-16 NOTE — Telephone Encounter (Signed)
Requested Prescriptions  Pending Prescriptions Disp Refills   venlafaxine XR (EFFEXOR-XR) 75 MG 24 hr capsule [Pharmacy Med Name: VENLAFAXINE HCL ER 75 MG CAP] 90 capsule 0    Sig: Take 1 capsule (75 mg total) by mouth daily withbreakfast. (to be taken with the 150mg  for a total of 225mg  daily)     Psychiatry: Antidepressants - SNRI - desvenlafaxine & venlafaxine Failed - 02/16/2020  9:18 AM      Failed - LDL in normal range and within 360 days    LDL Chol Calc (NIH)  Date Value Ref Range Status  08/03/2019 117 (H) 0 - 99 mg/dL Final         Failed - Total Cholesterol in normal range and within 360 days    Cholesterol, Total  Date Value Ref Range Status  08/03/2019 227 (H) 100 - 199 mg/dL Final         Failed - Triglycerides in normal range and within 360 days    Triglycerides  Date Value Ref Range Status  08/03/2019 433 (H) 0 - 149 mg/dL Final         Failed - Last BP in normal range    BP Readings from Last 1 Encounters:  01/01/20 (!) 148/98         Passed - Valid encounter within last 6 months    Recent Outpatient Visits          1 month ago Acute right eye pain   [facility] [place], [place], DO   6 months ago Essential hypertension   [facility] [place], Megan P, DO   1 year ago Routine general medical examination at a health care facility   [facility], [place], DO   1 year ago Panic disorder with agoraphobia   [facility] [place], Megan P, DO   2 years ago Panic disorder with agoraphobia   [facility] [place], Megan P, DO

## 2020-04-19 ENCOUNTER — Other Ambulatory Visit: Payer: Self-pay | Admitting: Family Medicine

## 2020-04-19 NOTE — Telephone Encounter (Signed)
Requested Prescriptions  Pending Prescriptions Disp Refills  . venlafaxine XR (EFFEXOR-XR) 150 MG 24 hr capsule [Pharmacy Med Name: VENLAFAXINE HCL ER 150 MG CAP] 90 capsule 1    Sig: Take 1 capsule (150 mg total) by mouth daily with breakfast. (to be taken with the 75mg  for a total of 225mg  daily)     Psychiatry: Antidepressants - SNRI - desvenlafaxine & venlafaxine Failed - 04/19/2020  8:35 AM      Failed - LDL in normal range and within 360 days    LDL Chol Calc (NIH)  Date Value Ref Range Status  08/03/2019 117 (H) 0 - 99 mg/dL Final         Failed - Total Cholesterol in normal range and within 360 days    Cholesterol, Total  Date Value Ref Range Status  08/03/2019 227 (H) 100 - 199 mg/dL Final         Failed - Triglycerides in normal range and within 360 days    Triglycerides  Date Value Ref Range Status  08/03/2019 433 (H) 0 - 149 mg/dL Final         Failed - Last BP in normal range    BP Readings from Last 1 Encounters:  01/01/20 (!) 148/98         Passed - Valid encounter within last 6 months    Recent Outpatient Visits          3 months ago Acute right eye pain   [facility] [place], [place], DO   8 months ago Essential hypertension   [facility] [place], Megan P, DO   1 year ago Routine general medical examination at a health care facility   [facility] [place], [place] P, DO   1 year ago Panic disorder with agoraphobia   [facility] [place], Megan P, DO   2 years ago Panic disorder with agoraphobia   [facility] [place], Megan P, DO

## 2020-05-20 ENCOUNTER — Other Ambulatory Visit: Payer: Self-pay | Admitting: Family Medicine

## 2020-08-20 ENCOUNTER — Other Ambulatory Visit: Payer: Self-pay | Admitting: Family Medicine

## 2020-09-11 ENCOUNTER — Ambulatory Visit: Payer: BC Managed Care – PPO | Admitting: Family Medicine

## 2020-09-29 ENCOUNTER — Other Ambulatory Visit: Payer: BC Managed Care – PPO

## 2020-09-29 DIAGNOSIS — R059 Cough, unspecified: Secondary | ICD-10-CM

## 2020-09-30 ENCOUNTER — Telehealth (INDEPENDENT_AMBULATORY_CARE_PROVIDER_SITE_OTHER): Payer: BC Managed Care – PPO | Admitting: Family Medicine

## 2020-09-30 ENCOUNTER — Encounter: Payer: Self-pay | Admitting: Family Medicine

## 2020-09-30 DIAGNOSIS — F4001 Agoraphobia with panic disorder: Secondary | ICD-10-CM

## 2020-09-30 DIAGNOSIS — N529 Male erectile dysfunction, unspecified: Secondary | ICD-10-CM

## 2020-09-30 DIAGNOSIS — Z20822 Contact with and (suspected) exposure to covid-19: Secondary | ICD-10-CM | POA: Diagnosis not present

## 2020-09-30 DIAGNOSIS — I10 Essential (primary) hypertension: Secondary | ICD-10-CM | POA: Diagnosis not present

## 2020-09-30 MED ORDER — HYDROCOD POLST-CPM POLST ER 10-8 MG/5ML PO SUER
5.0000 mL | Freq: Two times a day (BID) | ORAL | 0 refills | Status: DC | PRN
Start: 2020-09-30 — End: 2021-01-12

## 2020-09-30 MED ORDER — METOPROLOL SUCCINATE ER 50 MG PO TB24
50.0000 mg | ORAL_TABLET | Freq: Every day | ORAL | 1 refills | Status: DC
Start: 2020-09-30 — End: 2021-01-12

## 2020-09-30 MED ORDER — VENLAFAXINE HCL ER 75 MG PO CP24
ORAL_CAPSULE | ORAL | 1 refills | Status: DC
Start: 2020-09-30 — End: 2021-01-12

## 2020-09-30 MED ORDER — VENLAFAXINE HCL ER 150 MG PO CP24
150.0000 mg | ORAL_CAPSULE | Freq: Every day | ORAL | 1 refills | Status: DC
Start: 2020-09-30 — End: 2021-01-12

## 2020-09-30 MED ORDER — BENZONATATE 200 MG PO CAPS: 200.0000 mg | ORAL_CAPSULE | Freq: Two times a day (BID) | ORAL | 0 refills | Status: DC | PRN

## 2020-09-30 MED ORDER — PREDNISONE 10 MG PO TABS: ORAL_TABLET | ORAL | 0 refills | Status: DC

## 2020-09-30 MED ORDER — SILDENAFIL CITRATE 100 MG PO TABS: 50.0000 mg | ORAL_TABLET | Freq: Every day | ORAL | 11 refills | Status: DC | PRN

## 2020-09-30 NOTE — Assessment & Plan Note (Signed)
Under good control on current regimen. Continue current regimen. Continue to monitor. Call with any concerns. Refills given.   

## 2020-09-30 NOTE — Progress Notes (Signed)
There were no vitals taken for this visit.   Subjective:    Patient ID: Brady Edwards, male    DOB: 11-18-1965, 55 y.o.   MRN: [id number]  HPI: Brady Edwards is a 55 y.o. male  Chief Complaint  Patient presents with  . Cough    Pt states he has had a cough and a headache for the last few days. States he did have coworkers out of work but no sure if they had covid or not.   . Depression   UPPER RESPIRATORY TRACT INFECTION Duration: 09/25/20, 5 days Worst symptom: laryngitis Fever: no Cough: yes Shortness of breath: no Wheezing: yes Chest pain: no Chest tightness: no Chest congestion: no Nasal congestion: no Runny nose: no Post nasal drip: no Sneezing: no Sore throat: no Swollen glands: no Sinus pressure: no Headache: yes Face pain: no Toothache: no Ear pain: no  Ear pressure: yes "right Eyes red/itching:yes Eye drainage/crusting: yes  Vomiting: no Rash: no Fatigue: yes Sick contacts: yes Strep contacts: no  Context: better Recurrent sinusitis: no Relief with OTC cold/cough medications: no  Treatments attempted: ibuprofen   DEPRESSION Mood status: controlled Satisfied with current treatment?: yes Symptom severity: mild  Duration of current treatment : chronic Side effects: no Medication compliance: excellent compliance Psychotherapy/counseling: no  Previous psychiatric medications: effexor Depressed mood: no Anxious mood: yes Anhedonia: no Significant weight loss or gain: no Insomnia: no  Fatigue: no Feelings of worthlessness or guilt: no Impaired concentration/indecisiveness: no Suicidal ideations: no Hopelessness: no Crying spells: no Depression screen [facility] 2/9 09/30/2020 08/02/2019 10/06/2018 08/29/2018 01/05/2018  Decreased Interest 0 '3 1 3 1  ' Down, Depressed, Hopeless 0 0 '1 2 1  ' PHQ - 2 Score 0 '3 2 5 2  ' Altered sleeping 0 0 '1 1 1  ' Tired, decreased energy 0 '3 1 2 1  ' Change in appetite 0 '3 1 3 1  ' Feeling bad or failure about yourself  0 '3 1  2 ' 0  Trouble concentrating 0 0 0 0 1  Moving slowly or fidgety/restless 0 0 0 0 0  Suicidal thoughts 0 0 0 0 0  PHQ-9 Score 0 '12 6 13 6  ' Difficult doing work/chores Not difficult at all Somewhat difficult Very difficult Very difficult -   GAD 7 : Generalized Anxiety Score 10/06/2018 08/29/2018 01/05/2018 12/13/2017  Nervous, Anxious, on Edge '1 3 1 3  ' Control/stop worrying '1 2 2 3  ' Worry too much - different things '1 2 1 3  ' Trouble relaxing '1 2 1 3  ' Restless 0 0 0 2  Easily annoyed or irritable '1 1 1 1  ' Afraid - awful might happen '1 1 1 3  ' Total GAD 7 Score '6 11 7 18  ' Anxiety Difficulty Not difficult at all Somewhat difficult Somewhat difficult Extremely difficult   Has had some issues with ED. Able to achieve some erections, but not able to maintain them to climax  Relevant past medical, surgical, family and social history reviewed and updated as indicated. Interim medical history since our last visit reviewed. Allergies and medications reviewed and updated.  Review of Systems  Constitutional: Negative.   Respiratory: Negative.   Cardiovascular: Negative.   Gastrointestinal: Negative.   Musculoskeletal: Negative.   Skin: Negative.   Neurological: Negative.   Psychiatric/Behavioral: Positive for dysphoric mood. Negative for agitation, behavioral problems, confusion, decreased concentration, hallucinations, self-injury, sleep disturbance and suicidal ideas. The patient is nervous/anxious. The patient is not hyperactive.     Per HPI unless  specifically indicated above     Objective:    There were no vitals taken for this visit.  Wt Readings from Last 3 Encounters:  01/01/20 289 lb 12.8 oz (131.5 kg)  08/02/19 281 lb (127.5 kg)  10/06/18 259 lb (117.5 kg)    Physical Exam Vitals and nursing note reviewed.  Constitutional:      General: He is not in acute distress.    Appearance: Normal appearance. He is not ill-appearing, toxic-appearing or diaphoretic.  HENT:     Head:  Normocephalic and atraumatic.     Right Ear: External ear normal.     Left Ear: External ear normal.     Nose: Nose normal.     Mouth/Throat:     Mouth: Mucous membranes are moist.     Pharynx: Oropharynx is clear.  Eyes:     General: No scleral icterus.       Right eye: No discharge.        Left eye: No discharge.     Conjunctiva/sclera: Conjunctivae normal.     Pupils: Pupils are equal, round, and reactive to light.  Pulmonary:     Effort: Pulmonary effort is normal. No respiratory distress.     Comments: Speaking in full sentences Musculoskeletal:        General: Normal range of motion.     Cervical back: Normal range of motion.  Skin:    Coloration: Skin is not jaundiced or pale.     Findings: No bruising, erythema, lesion or rash.  Neurological:     Mental Status: He is alert and oriented to person, place, and time. Mental status is at baseline.  Psychiatric:        Mood and Affect: Mood normal.        Behavior: Behavior normal.        Thought Content: Thought content normal.        Judgment: Judgment normal.     Results for orders placed or performed in visit on 08/03/19  Lipid Panel w/o Chol/HDL Ratio OUT  Result Value Ref Range   Cholesterol, Total 227 (H) 100 - 199 mg/dL   Triglycerides 433 (H) 0 - 149 mg/dL   HDL 34 (L) >39 mg/dL   VLDL Cholesterol Cal 76 (H) 5 - 40 mg/dL   LDL Chol Calc (NIH) 117 (H) 0 - 99 mg/dL  Comp Met (CMET)  Result Value Ref Range   Glucose 119 (H) 65 - 99 mg/dL   BUN 17 6 - 24 mg/dL   Creatinine, Ser 1.27 0.76 - 1.27 mg/dL   GFR calc non Af Amer 64 >59 mL/min/1.73   GFR calc Af Amer 74 >59 mL/min/1.73   BUN/Creatinine Ratio 13 9 - 20   Sodium 141 134 - 144 mmol/L   Potassium 4.6 3.5 - 5.2 mmol/L   Chloride 104 96 - 106 mmol/L   CO2 23 20 - 29 mmol/L   Calcium 9.0 8.7 - 10.2 mg/dL   Total Protein 6.6 6.0 - 8.5 g/dL   Albumin 4.2 3.8 - 4.9 g/dL   Globulin, Total 2.4 1.5 - 4.5 g/dL   Albumin/Globulin Ratio 1.8 1.2 - 2.2    Bilirubin Total 0.3 0.0 - 1.2 mg/dL   Alkaline Phosphatase 108 39 - 117 IU/L   AST 25 0 - 40 IU/L   ALT 37 0 - 44 IU/L  CBC with Differential OUT  Result Value Ref Range   WBC 8.0 3.4 - 10.8 x10E3/uL   RBC 5.16 4.14 -  5.80 x10E6/uL   Hemoglobin 16.4 13.0 - 17.7 g/dL   Hematocrit 49.4 37.5 - 51.0 %   MCV 96 79 - 97 fL   MCH 31.8 26.6 - 33.0 pg   MCHC 33.2 31.5 - 35.7 g/dL   RDW 13.8 11.6 - 15.4 %   Platelets 221 150 - 450 x10E3/uL   Neutrophils 62 Not Estab. %   Lymphs 23 Not Estab. %   Monocytes 10 Not Estab. %   Eos 4 Not Estab. %   Basos 1 Not Estab. %   Neutrophils Absolute 5.0 1.4 - 7.0 x10E3/uL   Lymphocytes Absolute 1.8 0.7 - 3.1 x10E3/uL   Monocytes Absolute 0.8 0.1 - 0.9 x10E3/uL   EOS (ABSOLUTE) 0.3 0.0 - 0.4 x10E3/uL   Basophils Absolute 0.1 0.0 - 0.2 x10E3/uL   Immature Granulocytes 0 Not Estab. %   Immature Grans (Abs) 0.0 0.0 - 0.1 x10E3/uL      Assessment & Plan:   Problem List Items Addressed This Visit      Cardiovascular and Mediastinum   Hypertension    Under good control on current regimen. Continue current regimen. Continue to monitor. Call with any concerns. Refills given.        Relevant Medications   metoprolol succinate (TOPROL-XL) 50 MG 24 hr tablet   sildenafil (VIAGRA) 100 MG tablet     Other   Panic disorder with agoraphobia    Under good control on current regimen. Continue current regimen. Continue to monitor. Call with any concerns. Refills given.        Relevant Medications   venlafaxine XR (EFFEXOR-XR) 150 MG 24 hr capsule   venlafaxine XR (EFFEXOR-XR) 75 MG 24 hr capsule    Other Visit Diagnoses    Suspected COVID-19 virus infection    -  Primary   Waiting on results. Will treat with prednisone, tussionex and tessalon perles. Call if not getting better or getting worse. Self-quarantine until results back.   Essential hypertension       Relevant Medications   metoprolol succinate (TOPROL-XL) 50 MG 24 hr tablet   sildenafil  (VIAGRA) 100 MG tablet   Erectile dysfunction, unspecified erectile dysfunction type       Will start viagra. Call with any concerns. Continue to monitor.        Follow up plan: Return in about 6 months (around 03/30/2021) for physical.   . This visit was completed via MyChart due to the restrictions of the COVID-19 pandemic. All issues as above were discussed and addressed. Physical exam was done as above through visual confirmation on MyChart. If it was felt that the patient should be evaluated in the office, they were directed there. The patient verbally consented to this visit. . Location of the patient: home . Location of the provider: work . Those involved with this call:  . Provider: Park Liter, DO . CMA: Yvonna Alanis, [facility] . Front Desk/Registration: Jill Side  . Time spent on call: 25 minutes with patient face to face via video conference. More than 50% of this time was spent in counseling and coordination of care. 40 minutes total spent in review of patient's record and preparation of their chart.

## 2020-10-01 ENCOUNTER — Telehealth: Payer: Self-pay

## 2020-10-01 LAB — NOVEL CORONAVIRUS, NAA: SARS-CoV-2, NAA: NOT DETECTED

## 2020-10-01 LAB — SARS-COV-2, NAA 2 DAY TAT

## 2020-10-01 NOTE — Telephone Encounter (Signed)
Copied from CRM [id number]. Topic: General - Other >> Oct 01, 2020 10:25 AM Tamela Oddi wrote: Reason for CRM: Patient called to ask the doctor if it is ok to go back to work after testing negative for covid.  He stated he got a lot of different meds from the doctor and did not know if it is ok to go to work on these meds.  Please advise and call to discuss at [phone]  Would patient need apt?

## 2020-10-01 NOTE — Telephone Encounter (Signed)
Please advise 

## 2020-10-06 NOTE — Telephone Encounter (Signed)
Patient notified, he states that he tried to go to work last week and had to leave, so he is home today. Will let us know if he needs a work note.

## 2020-10-06 NOTE — Telephone Encounter (Signed)
If he is not coughing and feeling better, he is OK to go to work, if not, he should wait until he's feeling a little better.

## 2020-12-26 ENCOUNTER — Telehealth: Payer: Self-pay | Admitting: Family Medicine

## 2020-12-26 NOTE — Telephone Encounter (Signed)
No he's on the max dose- please get him scheduled with myself or another provider ASAP

## 2020-12-26 NOTE — Telephone Encounter (Signed)
Called pt to get him scheduled per Dr Laural Benes no answer left vm

## 2020-12-26 NOTE — Telephone Encounter (Signed)
Patient called to ask the doctor or nurse if it would be ok for him to take an extra depression medication.  He has an appt. Scheduled for May, but would like to know if he could take an extra pill in the mean time.  Please advise and call patient to discuss at [phone] asap.

## 2021-01-12 ENCOUNTER — Encounter: Payer: Self-pay | Admitting: Family Medicine

## 2021-01-12 ENCOUNTER — Other Ambulatory Visit: Payer: Self-pay

## 2021-01-12 ENCOUNTER — Ambulatory Visit: Payer: BC Managed Care – PPO | Admitting: Family Medicine

## 2021-01-12 VITALS — BP 144/92 | HR 77 | Temp 98.0°F | Wt 297.4 lb

## 2021-01-12 DIAGNOSIS — F4001 Agoraphobia with panic disorder: Secondary | ICD-10-CM

## 2021-01-12 MED ORDER — VENLAFAXINE HCL ER 150 MG PO CP24: 150.0000 mg | ORAL_CAPSULE | Freq: Every day | ORAL | 6 refills | Status: DC

## 2021-01-12 MED ORDER — BUPROPION HCL ER (SR) 150 MG PO TB12: ORAL_TABLET | ORAL | 3 refills | Status: DC

## 2021-01-12 MED ORDER — METOPROLOL SUCCINATE ER 50 MG PO TB24: 50.0000 mg | ORAL_TABLET | Freq: Every day | ORAL | 6 refills | Status: DC

## 2021-01-12 MED ORDER — TRAZODONE HCL 50 MG PO TABS: 25.0000 mg | ORAL_TABLET | Freq: Every evening | ORAL | 3 refills | Status: DC | PRN

## 2021-01-12 MED ORDER — VENLAFAXINE HCL ER 75 MG PO CP24: ORAL_CAPSULE | ORAL | 6 refills | Status: DC

## 2021-01-12 NOTE — Assessment & Plan Note (Signed)
Not under good control. Go back to the 225mg  on effexor. Will add trazodone and wellbutrin. Recheck 1 month. Call with any concerns.

## 2021-01-12 NOTE — Progress Notes (Signed)
BP (!) 144/92   Pulse 77   Temp 98 F (36.7 C)   Wt 297 lb 6.4 oz (134.9 kg)   SpO2 97%   BMI 42.46 kg/m    Subjective:    Patient ID: Brady Edwards, male    DOB: June 01, 1966, 55 y.o.   MRN: [id number]  HPI: Brady Edwards is a 55 y.o. male  Chief Complaint  Patient presents with  . Depression    Patient states his depression had gotten worse, patient is taking 300 mg of Venlafaxine    DEPRESSION- started taking 300mg  on the venlafaxine Mood status: exacerbated Satisfied with current treatment?: no Symptom severity: moderate  Duration of current treatment : chronic Side effects: no Medication compliance: good compliance Psychotherapy/counseling: no  Previous psychiatric medications: effexor Depressed mood: yes Anxious mood: yes Anhedonia: yes Significant weight loss or gain: yes Insomnia: yes hard to stay asleep Fatigue: yes Feelings of worthlessness or guilt: yes Impaired concentration/indecisiveness: yes Suicidal ideations: no Hopelessness: yes Crying spells: no Depression screen [facility] 2/9 01/12/2021 09/30/2020 08/02/2019 10/06/2018 08/29/2018  Decreased Interest 3 0 3 1 3   Down, Depressed, Hopeless 1 0 0 1 2  PHQ - 2 Score 4 0 3 2 5   Altered sleeping 3 0 0 1 1  Tired, decreased energy 3 0 3 1 2   Change in appetite 3 0 3 1 3   Feeling bad or failure about yourself  2 0 3 1 2   Trouble concentrating 2 0 0 0 0  Moving slowly or fidgety/restless 1 0 0 0 0  Suicidal thoughts 0 0 0 0 0  PHQ-9 Score 18 0 12 6 13   Difficult doing work/chores Extremely dIfficult Not difficult at all Somewhat difficult Very difficult Very difficult  Some recent data might be hidden    Relevant past medical, surgical, family and social history reviewed and updated as indicated. Interim medical history since our last visit reviewed. Allergies and medications reviewed and updated.  Review of Systems  Constitutional: Negative.   Respiratory: Negative.   Cardiovascular: Negative.    Neurological: Negative.   Psychiatric/Behavioral: Positive for dysphoric mood and sleep disturbance. Negative for agitation, behavioral problems, confusion, decreased concentration, hallucinations, self-injury and suicidal ideas. The patient is nervous/anxious. The patient is not hyperactive.     Per HPI unless specifically indicated above     Objective:    BP (!) 144/92   Pulse 77   Temp 98 F (36.7 C)   Wt 297 lb 6.4 oz (134.9 kg)   SpO2 97%   BMI 42.46 kg/m   Wt Readings from Last 3 Encounters:  01/12/21 297 lb 6.4 oz (134.9 kg)  01/01/20 289 lb 12.8 oz (131.5 kg)  08/02/19 281 lb (127.5 kg)    Physical Exam Vitals and nursing note reviewed.  Constitutional:      General: He is not in acute distress.    Appearance: Normal appearance. He is not ill-appearing, toxic-appearing or diaphoretic.  HENT:     Head: Normocephalic and atraumatic.     Right Ear: External ear normal.     Left Ear: External ear normal.     Nose: Nose normal.     Mouth/Throat:     Mouth: Mucous membranes are moist.     Pharynx: Oropharynx is clear.  Eyes:     General: No scleral icterus.       Right eye: No discharge.        Left eye: No discharge.     Extraocular Movements:  Extraocular movements intact.     Conjunctiva/sclera: Conjunctivae normal.     Pupils: Pupils are equal, round, and reactive to light.  Cardiovascular:     Rate and Rhythm: Normal rate and regular rhythm.     Pulses: Normal pulses.     Heart sounds: Normal heart sounds. No murmur heard. No friction rub. No gallop.   Pulmonary:     Effort: Pulmonary effort is normal. No respiratory distress.     Breath sounds: Normal breath sounds. No stridor. No wheezing, rhonchi or rales.  Chest:     Chest wall: No tenderness.  Musculoskeletal:        General: Normal range of motion.     Cervical back: Normal range of motion and neck supple.  Skin:    General: Skin is warm and dry.     Capillary Refill: Capillary refill takes less  than 2 seconds.     Coloration: Skin is not jaundiced or pale.     Findings: No bruising, erythema, lesion or rash.  Neurological:     General: No focal deficit present.     Mental Status: He is alert and oriented to person, place, and time. Mental status is at baseline.  Psychiatric:        Mood and Affect: Mood is depressed.        Behavior: Behavior normal.        Thought Content: Thought content normal.        Judgment: Judgment normal.     Results for orders placed or performed in visit on 09/29/20  Novel Coronavirus, NAA ([facility])   Specimen: Nasopharyngeal(NP) swabs in vial transport medium  Result Value Ref Range   SARS-CoV-2, NAA Not Detected Not Detected  SARS-COV-2, NAA 2 DAY TAT  Result Value Ref Range   SARS-CoV-2, NAA 2 DAY TAT Performed       Assessment & Plan:   Problem List Items Addressed This Visit      Other   Panic disorder with agoraphobia - Primary    Not under good control. Go back to the 225mg  on effexor. Will add trazodone and wellbutrin. Recheck 1 month. Call with any concerns.       Relevant Medications   traZODone (DESYREL) 50 MG tablet   venlafaxine XR (EFFEXOR-XR) 150 MG 24 hr capsule   venlafaxine XR (EFFEXOR-XR) 75 MG 24 hr capsule   buPROPion (WELLBUTRIN SR) 150 MG 12 hr tablet       Follow up plan: Return in about 4 weeks (around 02/09/2021) for physical.

## 2021-02-12 ENCOUNTER — Other Ambulatory Visit: Payer: Self-pay

## 2021-02-12 ENCOUNTER — Ambulatory Visit (INDEPENDENT_AMBULATORY_CARE_PROVIDER_SITE_OTHER): Payer: BC Managed Care – PPO | Admitting: Family Medicine

## 2021-02-12 ENCOUNTER — Encounter: Payer: Self-pay | Admitting: Family Medicine

## 2021-02-12 VITALS — BP 133/86 | HR 68 | Temp 98.2°F | Ht 69.5 in | Wt 295.2 lb

## 2021-02-12 DIAGNOSIS — E782 Mixed hyperlipidemia: Secondary | ICD-10-CM

## 2021-02-12 DIAGNOSIS — I10 Essential (primary) hypertension: Secondary | ICD-10-CM

## 2021-02-12 DIAGNOSIS — N401 Enlarged prostate with lower urinary tract symptoms: Secondary | ICD-10-CM | POA: Diagnosis not present

## 2021-02-12 DIAGNOSIS — Z0000 Encounter for general adult medical examination without abnormal findings: Secondary | ICD-10-CM

## 2021-02-12 DIAGNOSIS — F4001 Agoraphobia with panic disorder: Secondary | ICD-10-CM | POA: Diagnosis not present

## 2021-02-12 DIAGNOSIS — R3914 Feeling of incomplete bladder emptying: Secondary | ICD-10-CM

## 2021-02-12 LAB — URINALYSIS, ROUTINE W REFLEX MICROSCOPIC
Bilirubin, UA: NEGATIVE
Glucose, UA: NEGATIVE
Ketones, UA: NEGATIVE
Leukocytes,UA: NEGATIVE
Nitrite, UA: NEGATIVE
Specific Gravity, UA: >1.03 — ABNORMAL HIGH (ref 1.005–1.030)
Urobilinogen, Ur: 0.2 mg/dL (ref 0.2–1.0)
pH, UA: 5.5 (ref 5.0–7.5)

## 2021-02-12 LAB — MICROSCOPIC EXAMINATION
Bacteria, UA: NONE SEEN
Epithelial Cells (non renal): NONE SEEN /hpf (ref 0–10)
WBC, UA: NONE SEEN /hpf (ref 0–5)

## 2021-02-12 LAB — MICROALBUMIN, URINE WAIVED
Creatinine, Urine Waived: 200 mg/dL (ref 10–300)
Microalb, Ur Waived: 150 mg/L — ABNORMAL HIGH (ref 0–19)

## 2021-02-12 MED ORDER — BUPROPION HCL ER (XL) 150 MG PO TB24: 150.0000 mg | ORAL_TABLET | Freq: Every day | ORAL | 6 refills | Status: DC

## 2021-02-12 NOTE — Progress Notes (Signed)
BP 133/86   Pulse 68   Temp 98.2 F (36.8 C)   Ht 5' 9.5" (1.765 m)   Wt 295 lb 3.2 oz (133.9 kg)   SpO2 96%   BMI 42.97 kg/m    Subjective:    Patient ID: Brady Edwards, male    DOB: [birth year], 55 y.o.   MRN: [id number]  HPI: Brady Edwards is a 55 y.o. male presenting on 02/12/2021 for comprehensive medical examination. Current medical complaints include:  ANXIETY/DEPRESSION Duration: chronic Status:stable Anxious mood: yes  Excessive worrying: yes Irritability: no  Sweating: no Nausea: no Palpitations:no Hyperventilation: no Panic attacks: no Agoraphobia: yes  Obscessions/compulsions: yes Depressed mood: yes [facility] 02/12/2021 01/12/2021 09/30/2020 08/02/2019 10/06/2018  Decreased Interest 2 3 0 3 1  Down, Depressed, Hopeless 2 1 0 0 1  PHQ - 2 Score 4 4 0 3 2  Altered sleeping 3 3 0 0 1  Tired, decreased energy 3 3 0 3 1  Change in appetite 2 3 0 3 1  Feeling bad or failure about yourself  2 2 0 3 1  Trouble concentrating 0 2 0 0 0  Moving slowly or fidgety/restless 0 1 0 0 0  Suicidal thoughts 0 0 0 0 0  PHQ-9 Score 14 18 0 12 6  Difficult doing work/chores Somewhat difficult Extremely dIfficult Not difficult at all Somewhat difficult Very difficult  Some recent data might be hidden   GAD 7 : Generalized Anxiety Score 01/12/2021 10/06/2018 08/29/2018 01/05/2018  Nervous, Anxious, on Edge 2 1 3 1   Control/stop worrying 3 1 2 2   Worry too much - different things 3 1 2 1   Trouble relaxing 2 1 2 1   Restless 1 0 0 0  Easily annoyed or irritable 2 1 1 1   Afraid - awful might happen 3 1 1 1   Total GAD 7 Score 16 6 11 7   Anxiety Difficulty Extremely difficult Not difficult at all Somewhat difficult Somewhat difficult   Anhedonia: no Weight changes: no Insomnia: yes  Hypersomnia: yes Fatigue/loss of energy: yes Feelings of worthlessness: yes Feelings of guilt: no Impaired concentration/indecisiveness: no Suicidal ideations: yes  Crying  spells: yes Recent Stressors/Life Changes: yes   Relationship problems: no   Family stress: no     Financial stress: no    Job stress: no    Recent death/loss: no  HYPERTENSION / HYPERLIPIDEMIA Satisfied with current treatment? yes Duration of hypertension: chronic BP monitoring frequency: not checking BP medication side effects: no Past BP meds: metoprolol Duration of hyperlipidemia: chronic Cholesterol medication side effects: not on anything Cholesterol supplements: none Past cholesterol medications: none Medication compliance: excellent compliance Aspirin: no Recent stressors: yes Recurrent headaches: no Visual changes: no Palpitations: yes Dyspnea: no Chest pain: no Lower extremity edema: no Dizzy/lightheaded: no  He currently lives with: alone Interim Problems from his last visit: no  Depression Screen done today and results listed below:  [facility] 02/12/2021 01/12/2021 09/30/2020 08/02/2019 10/06/2018  Decreased Interest 2 3 0 3 1  Down, Depressed, Hopeless 2 1 0 0 1  PHQ - 2 Score 4 4 0 3 2  Altered sleeping 3 3 0 0 1  Tired, decreased energy 3 3 0 3 1  Change in appetite 2 3 0 3 1  Feeling bad or failure about yourself  2 2 0 3 1  Trouble concentrating 0 2 0 0 0  Moving slowly or fidgety/restless 0 1 0 0 0  Suicidal thoughts 0 0 0 0 0  PHQ-9 Score 14 18 0 12 6  Difficult doing work/chores Somewhat difficult Extremely dIfficult Not difficult at all Somewhat difficult Very difficult  Some recent data might be hidden    Past Medical History:  Past Medical History:  Diagnosis Date   Atrial fibrillation (HCC)    Depression    Hypertension     Surgical History:  History reviewed. No pertinent surgical history.  Medications:  Current Outpatient Medications on File Prior to Visit  Medication Sig   metoprolol succinate (TOPROL-XL) 50 MG 24 hr tablet Take 1 tablet (50 mg total) by mouth daily.   sildenafil (VIAGRA) 100 MG tablet Take 0.5-1  tablets (50-100 mg total) by mouth daily as needed for erectile dysfunction.   traZODone (DESYREL) 50 MG tablet Take 0.5-1 tablets (25-50 mg total) by mouth at bedtime as needed for sleep.   venlafaxine XR (EFFEXOR-XR) 150 MG 24 hr capsule Take 1 capsule (150 mg total) by mouth daily with breakfast. (to be taken with the  for a total of  daily)   venlafaxine XR (EFFEXOR-XR) 75 MG 24 hr capsule Take 1 capsule (75 mg total) by mouth daily with breakfast. (to be taken with the  for a total of  daily)   No current facility-administered medications on file prior to visit.    Allergies:  No Known Allergies  Social History:  Social History   Socioeconomic History   Marital status: Married    Spouse name: Not on file   Number of children: Not on file   Years of education: Not on file   Highest education level: Not on file  Occupational History   Not on file  Tobacco Use   Smoking status: Every Day    Packs/day: 1.50    Pack years: 0.00    Types: Cigarettes   Smokeless tobacco: Never  Vaping Use   Vaping Use: Never used  Substance and Sexual Activity   Alcohol use: Not Currently    Alcohol/week: 0.0 standard drinks   Drug use: No   Sexual activity: Yes  Other Topics Concern   Not on file  Social History Narrative   Not on file   Social Determinants of Health   Financial Resource Strain: Not on file  Food Insecurity: Not on file  Transportation Needs: Not on file  Physical Activity: Not on file  Stress: Not on file  Social Connections: Not on file  Intimate Partner Violence: Not on file   Social History   Tobacco Use  Smoking Status Every Day   Packs/day: 1.50   Pack years: 0.00   Types: Cigarettes  Smokeless Tobacco Never   Social History   Substance and Sexual Activity  Alcohol Use Not Currently   Alcohol/week: 0.0 standard drinks    Family History:  Family History  Problem Relation Age of Onset   Cancer Mother        uterine, ovarian,  and breast   Heart disease Father        MI   Diabetes Brother    Cancer Maternal Grandmother        skin   Cancer Maternal Grandfather        skin   Cancer Paternal Grandmother     Past medical history, surgical history, medications, allergies, family history and social history reviewed with patient today and changes made to appropriate areas of the chart.   Review of Systems  Constitutional: Negative.   HENT: Negative.  Eyes: Negative.   Respiratory:  Positive for cough. Negative for hemoptysis, sputum production, shortness of breath and wheezing.   Cardiovascular:  Positive for palpitations. Negative for chest pain, orthopnea, claudication, leg swelling and PND.  Gastrointestinal:  Positive for constipation, diarrhea and heartburn (with food choices and drinks). Negative for abdominal pain, blood in stool, melena, nausea and vomiting.  Genitourinary: Negative.   Musculoskeletal: Negative.   Skin: Negative.   Neurological: Negative.   Endo/Heme/Allergies:  Positive for polydipsia. Negative for environmental allergies. Does not bruise/bleed easily.  Psychiatric/Behavioral:  Positive for depression. Negative for hallucinations, memory loss, substance abuse and suicidal ideas. The patient is nervous/anxious. The patient does not have insomnia.   All other ROS negative except what is listed above and in the HPI.      Objective:    BP 133/86   Pulse 68   Temp 98.2 F (36.8 C)   Ht 5' 9.5" (1.765 m)   Wt 295 lb 3.2 oz (133.9 kg)   SpO2 96%   BMI 42.97 kg/m   Wt Readings from Last 3 Encounters:  02/12/21 295 lb 3.2 oz (133.9 kg)  01/12/21 297 lb 6.4 oz (134.9 kg)  01/01/20 289 lb 12.8 oz (131.5 kg)    Physical Exam Vitals and nursing note reviewed.  Constitutional:      General: He is not in acute distress.    Appearance: Normal appearance. He is obese. He is not ill-appearing, toxic-appearing or diaphoretic.  HENT:     Head: Normocephalic and atraumatic.     Right  Ear: Tympanic membrane, ear canal and external ear normal. There is no impacted cerumen.     Left Ear: Tympanic membrane, ear canal and external ear normal. There is no impacted cerumen.     Nose: Nose normal. No congestion or rhinorrhea.     Mouth/Throat:     Mouth: Mucous membranes are moist.     Pharynx: Oropharynx is clear. No oropharyngeal exudate or posterior oropharyngeal erythema.  Eyes:     General: No scleral icterus.       Right eye: No discharge.        Left eye: No discharge.     Extraocular Movements: Extraocular movements intact.     Conjunctiva/sclera: Conjunctivae normal.     Pupils: Pupils are equal, round, and reactive to light.  Neck:     Vascular: No carotid bruit.  Cardiovascular:     Rate and Rhythm: Normal rate and regular rhythm.     Pulses: Normal pulses.     Heart sounds: No murmur heard.   No friction rub. No gallop.  Pulmonary:     Effort: Pulmonary effort is normal. No respiratory distress.     Breath sounds: Normal breath sounds. No stridor. No wheezing, rhonchi or rales.  Chest:     Chest wall: No tenderness.  Abdominal:     General: Abdomen is flat. Bowel sounds are normal. There is no distension.     Palpations: Abdomen is soft. There is no mass.     Tenderness: There is no abdominal tenderness. There is no right CVA tenderness, left CVA tenderness, guarding or rebound.     Hernia: No hernia is present.  Genitourinary:    Comments: Genital exam deferred with shared decision making Musculoskeletal:        General: No swelling, tenderness, deformity or signs of injury.     Cervical back: Normal range of motion and neck supple. No rigidity. No muscular tenderness.  Right lower leg: No edema.     Left lower leg: No edema.  Lymphadenopathy:     Cervical: No cervical adenopathy.  Skin:    General: Skin is warm and dry.     Capillary Refill: Capillary refill takes less than 2 seconds.     Coloration: Skin is not jaundiced or pale.     Findings:  No bruising, erythema, lesion or rash.  Neurological:     General: No focal deficit present.     Mental Status: He is alert and oriented to person, place, and time.     Cranial Nerves: No cranial nerve deficit.     Sensory: No sensory deficit.     Motor: No weakness.     Coordination: Coordination normal.     Gait: Gait normal.     Deep Tendon Reflexes: Reflexes normal.  Psychiatric:        Mood and Affect: Mood normal.        Behavior: Behavior normal.        Thought Content: Thought content normal.        Judgment: Judgment normal.    Results for orders placed or performed in visit on 02/12/21  Microscopic Examination   Urine  Result Value Ref Range   WBC, UA None seen 0 - 5 /hpf   RBC 0-2 0 - 2 /hpf   Epithelial Cells (non renal) None seen 0 - 10 /hpf   Mucus, UA Present (A) Not Estab.   Bacteria, UA None seen None seen/Few  Urinalysis, Routine w reflex microscopic  Result Value Ref Range   Specific Gravity, UA >1.030 (H) 1.005 - 1.030   pH, UA 5.5 5.0 - 7.5   Color, UA Yellow Yellow   Appearance Ur Clear Clear   Leukocytes,UA Negative Negative   Protein,UA 2+ (A) Negative/Trace   Glucose, UA Negative Negative   Ketones, UA Negative Negative   RBC, UA Trace (A) Negative   Bilirubin, UA Negative Negative   Urobilinogen, Ur 0.2 0.2 - 1.0 mg/dL   Nitrite, UA Negative Negative   Microscopic Examination See below:   Microalbumin, Urine Waived  Result Value Ref Range   Microalb, Ur Waived 150 (H) 0 - 19 mg/L   Creatinine, Urine Waived 200 10 - 300 mg/dL   Microalb/Creat Ratio 30-300 (H) <30 mg/g      Assessment & Plan:   Problem List Items Addressed This Visit       Cardiovascular and Mediastinum   Hypertension    Under good control on current regimen. Continue current regimen. Continue to monitor. Call with any concerns. Refills given. Labs drawn today.         Relevant Orders   Comprehensive metabolic panel   CBC with Differential/Platelet   TSH    Microalbumin, Urine Waived (Completed)     Genitourinary   BPH (benign prostatic hyperplasia)    Under good control on current regimen. Continue current regimen. Continue to monitor. Call with any concerns. Refills given. Labs drawn today.        Relevant Orders   Comprehensive metabolic panel   PSA   Urinalysis, Routine w reflex microscopic (Completed)     Other   Panic disorder with agoraphobia    Still not doing great. Forgetting his 2nd wellbutrin- will change to ER. He will let us know if he feels like it is not working. Continue effexor and trazodone. Call with any concerns.       Relevant Medications   buPROPion [facility]  XL) 150 MG 24 hr tablet   Other Relevant Orders   Comprehensive metabolic panel   Hyperlipidemia    Under good control on current regimen. Continue current regimen. Continue to monitor. Call with any concerns. Refills given. Labs drawn today.        Relevant Orders   Comprehensive metabolic panel   Lipid Panel w/o Chol/HDL Ratio   Other Visit Diagnoses     Routine general medical examination at a health care facility    -  Primary   Vaccines declined. Screening labs checked today. Will send in his cologuard. Call with any concerns. Continue to monitor.         Discussed aspirin prophylaxis for myocardial infarction prevention and decision was it was not indicated  LABORATORY TESTING:  Health maintenance labs ordered today as discussed above.   The natural history of prostate cancer and ongoing controversy regarding screening and potential treatment outcomes of prostate cancer has been discussed with the patient. The meaning of a false positive PSA and a false negative PSA has been discussed. He indicates understanding of the limitations of this screening test and wishes to proceed with screening PSA testing.   IMMUNIZATIONS:   - Tdap: Tetanus vaccination status reviewed: last tetanus booster within 10 years. - Influenza: Refused -  Pneumovax:  will consider and let us know - Prevnar: Not applicable - COVID: Refused - Zostavax vaccine: Refused  SCREENING: - Colonoscopy:  Will send in cologuard   Discussed with patient purpose of the colonoscopy is to detect colon cancer at curable precancerous or early stages   PATIENT COUNSELING:    Sexuality: Discussed sexually transmitted diseases, partner selection, use of condoms, avoidance of unintended pregnancy  and contraceptive alternatives.   Advised to avoid cigarette smoking.  I discussed with the patient that most people either abstain from alcohol or drink within safe limits (<=14/week and <=4 drinks/occasion for males, <=7/weeks and <= 3 drinks/occasion for females) and that the risk for alcohol disorders and other health effects rises proportionally with the number of drinks per week and how often a drinker exceeds daily limits.  Discussed cessation/primary prevention of drug use and availability of treatment for abuse.   Diet: Encouraged to adjust caloric intake to maintain  or achieve ideal body weight, to reduce intake of dietary saturated fat and total fat, to limit sodium intake by avoiding high sodium foods and not adding table salt, and to maintain adequate dietary potassium and calcium preferably from fresh fruits, vegetables, and low-fat dairy products.    stressed the importance of regular exercise  Injury prevention: Discussed safety belts, safety helmets, smoke detector, smoking near bedding or upholstery.   Dental health: Discussed importance of regular tooth brushing, flossing, and dental visits.   Follow up plan: NEXT PREVENTATIVE PHYSICAL DUE IN 1 YEAR. No follow-ups on file.

## 2021-02-12 NOTE — Assessment & Plan Note (Signed)
Under good control on current regimen. Continue current regimen. Continue to monitor. Call with any concerns. Refills given. Labs drawn today.   

## 2021-02-12 NOTE — Assessment & Plan Note (Signed)
Still not doing great. Forgetting his 2nd wellbutrin- will change to ER. He will let us know if he feels like it is not working. Continue effexor and trazodone. Call with any concerns.

## 2021-02-13 ENCOUNTER — Other Ambulatory Visit: Payer: Self-pay | Admitting: Family Medicine

## 2021-02-13 DIAGNOSIS — N289 Disorder of kidney and ureter, unspecified: Secondary | ICD-10-CM

## 2021-02-13 LAB — CBC WITH DIFFERENTIAL/PLATELET
Basophils Absolute: 0.1 10*3/uL (ref 0.0–0.2)
Basos: 1 %
EOS (ABSOLUTE): 0.3 10*3/uL (ref 0.0–0.4)
Eos: 3 %
Hematocrit: 47.9 % (ref 37.5–51.0)
Hemoglobin: 16.3 g/dL (ref 13.0–17.7)
Immature Grans (Abs): 0.1 10*3/uL (ref 0.0–0.1)
Immature Granulocytes: 1 %
Lymphocytes Absolute: 2.7 10*3/uL (ref 0.7–3.1)
Lymphs: 27 %
MCH: 31.3 pg (ref 26.6–33.0)
MCHC: 34 g/dL (ref 31.5–35.7)
MCV: 92 fL (ref 79–97)
Monocytes Absolute: 0.9 10*3/uL (ref 0.1–0.9)
Monocytes: 9 %
Neutrophils Absolute: 6 10*3/uL (ref 1.4–7.0)
Neutrophils: 59 %
Platelets: 252 10*3/uL (ref 150–450)
RBC: 5.2 x10E6/uL (ref 4.14–5.80)
RDW: 13.2 % (ref 11.6–15.4)
WBC: 10 10*3/uL (ref 3.4–10.8)

## 2021-02-13 LAB — COMPREHENSIVE METABOLIC PANEL
ALT: 27 IU/L (ref 0–44)
AST: 18 IU/L (ref 0–40)
Albumin/Globulin Ratio: 1.6 (ref 1.2–2.2)
Albumin: 4.1 g/dL (ref 3.8–4.9)
Alkaline Phosphatase: 129 IU/L — ABNORMAL HIGH (ref 44–121)
BUN/Creatinine Ratio: 14 (ref 9–20)
BUN: 26 mg/dL — ABNORMAL HIGH (ref 6–24)
Bilirubin Total: <0.2 mg/dL (ref 0.0–1.2)
CO2: 17 mmol/L — ABNORMAL LOW (ref 20–29)
Calcium: 9.2 mg/dL (ref 8.7–10.2)
Chloride: 105 mmol/L (ref 96–106)
Creatinine, Ser: 1.84 mg/dL — ABNORMAL HIGH (ref 0.76–1.27)
Globulin, Total: 2.6 g/dL (ref 1.5–4.5)
Glucose: 95 mg/dL (ref 65–99)
Potassium: 4.3 mmol/L (ref 3.5–5.2)
Sodium: 140 mmol/L (ref 134–144)
Total Protein: 6.7 g/dL (ref 6.0–8.5)
eGFR: 43 mL/min/{1.73_m2} — ABNORMAL LOW (ref >59)

## 2021-02-13 LAB — LIPID PANEL W/O CHOL/HDL RATIO
Cholesterol, Total: 220 mg/dL — ABNORMAL HIGH (ref 100–199)
HDL: 31 mg/dL — ABNORMAL LOW (ref >39)
LDL Chol Calc (NIH): 108 mg/dL — ABNORMAL HIGH (ref 0–99)
Triglycerides: 469 mg/dL — ABNORMAL HIGH (ref 0–149)
VLDL Cholesterol Cal: 81 mg/dL — ABNORMAL HIGH (ref 5–40)

## 2021-02-13 LAB — PSA: Prostate Specific Ag, Serum: 0.9 ng/mL (ref 0.0–4.0)

## 2021-02-13 LAB — TSH: TSH: 2.13 u[IU]/mL (ref 0.450–4.500)

## 2021-02-27 ENCOUNTER — Other Ambulatory Visit: Payer: Self-pay

## 2021-03-27 ENCOUNTER — Encounter: Payer: BC Managed Care – PPO | Admitting: Family Medicine

## 2021-04-14 ENCOUNTER — Ambulatory Visit: Payer: Self-pay | Admitting: *Deleted

## 2021-04-14 NOTE — Telephone Encounter (Signed)
Reason for Disposition  Medicine patch causing local rash or itching    Not related to patch, questions regarding weaning off meds.  Answer Assessment - Initial Assessment Questions 1. NAME of MEDICATION: "What medicine are you calling about?"     Wellbutrin and venlafaxine 2. QUESTION: "What is your question?" (e.g., double dose of medicine, side effect)     Would like to come off meds 3. PRESCRIBING HCP: "Who prescribed it?" Reason: if prescribed by specialist, call should be referred to that group.     *No Answer* 4. SYMPTOMS: "Do you have any symptoms?"     *No Answer* 5. SEVERITY: If symptoms are present, ask "Are they mild, moderate or severe?"     *No Answer* 6. PREGNANCY:  "Is there any chance that you are pregnant?" "When was your last menstrual period?"     *No Answer*  Protocols used: Medication Question Call-A-AH

## 2021-04-14 NOTE — Telephone Encounter (Signed)
appointment

## 2021-04-14 NOTE — Telephone Encounter (Signed)
Routing to provider to advise.  

## 2021-04-14 NOTE — Telephone Encounter (Signed)
Pt states he would like to come off Wellbutrin and Venlafaxine. States "I have been on for years and want to see how I do without them." States has not taken for 2 days "Cold [place]." Experiencing nausea and "Brain fog." States "Read where you should come off gradually." Assured pt NT would route to practice for PCPs review and final disposition. Pt verbalizes understanding.   CB# [phone] PLease advise.

## 2021-04-16 ENCOUNTER — Ambulatory Visit: Payer: BC Managed Care – PPO | Admitting: Family Medicine

## 2021-07-14 ENCOUNTER — Telehealth: Payer: Self-pay | Admitting: Family Medicine

## 2021-07-14 NOTE — Telephone Encounter (Signed)
Copied from CRM [id number]. Topic: General - Inquiry >> Jul 14, 2021  2:19 PM Brady Edwards D wrote: Reason for CRM: Pt called asking if there is a program where he can get his prescriptions.  He Is out of work and can't afford his medications this month   CB#  [phone]

## 2021-07-14 NOTE — Telephone Encounter (Signed)
Called patient and provided information

## 2021-07-15 ENCOUNTER — Other Ambulatory Visit: Payer: Self-pay | Admitting: Family Medicine

## 2021-07-15 ENCOUNTER — Other Ambulatory Visit: Payer: Self-pay

## 2021-07-15 MED ORDER — VENLAFAXINE HCL ER 150 MG PO CP24
ORAL_CAPSULE | ORAL | 6 refills | Status: DC
  Filled 2021-07-16: qty 30, 30d supply, fill #0

## 2021-07-15 MED ORDER — BUPROPION HCL ER (XL) 150 MG PO TB24
ORAL_TABLET | ORAL | 6 refills | Status: DC
  Filled 2021-07-15: qty 30, 30d supply, fill #0

## 2021-07-15 MED ORDER — METOPROLOL SUCCINATE ER 50 MG PO TB24
ORAL_TABLET | ORAL | 6 refills | Status: DC
  Filled 2021-07-15: qty 30, 30d supply, fill #0

## 2021-07-15 MED ORDER — VENLAFAXINE HCL ER 75 MG PO CP24
ORAL_CAPSULE | ORAL | 6 refills | Status: DC
  Filled 2021-07-15: qty 30, 30d supply, fill #0

## 2021-07-16 ENCOUNTER — Other Ambulatory Visit: Payer: Self-pay

## 2021-07-16 MED ORDER — METOPROLOL SUCCINATE ER 50 MG PO TB24
ORAL_TABLET | ORAL | 0 refills | Status: DC
  Filled 2021-07-16: qty 90, fill #0

## 2021-07-16 MED ORDER — BUPROPION HCL ER (XL) 150 MG PO TB24
ORAL_TABLET | ORAL | 0 refills | Status: DC
  Filled 2021-07-16: qty 90, fill #0

## 2021-07-16 MED ORDER — VENLAFAXINE HCL ER 75 MG PO CP24
ORAL_CAPSULE | ORAL | 0 refills | Status: DC
  Filled 2021-07-16: qty 30, fill #0

## 2021-07-16 NOTE — Telephone Encounter (Signed)
Requested medication (s) are due for refill today Yes.   Requested medication (s) are on the active medication list Yes  Future visit scheduled  08/14/21  Note to clinic-Patient's prescriptions are being transferred to [facility] at [facility]. Pharmacist, ADAM requesting Effexor XL 75 mg caps( X3 ). Routing to physician for review.   Requested Prescriptions  Pending Prescriptions Disp Refills   venlafaxine XR (EFFEXOR-XR) 75 MG 24 hr capsule 30 capsule 6    Sig: Take one capsule by mouth once daily with breakfast. (Take with the 150mg  capsule for a total of 225mg ).     Psychiatry: Antidepressants - SNRI - desvenlafaxine & venlafaxine Failed - 07/15/2021  2:58 PM      Failed - LDL in normal range and within 360 days    LDL Chol Calc (NIH)  Date Value Ref Range Status  02/12/2021 108 (H) 0 - 99 mg/dL Final          Failed - Total Cholesterol in normal range and within 360 days    Cholesterol, Total  Date Value Ref Range Status  02/12/2021 220 (H) 100 - 199 mg/dL Final          Failed - Triglycerides in normal range and within 360 days    Triglycerides  Date Value Ref Range Status  02/12/2021 469 (H) 0 - 149 mg/dL Final          Passed - Last BP in normal range    BP Readings from Last 1 Encounters:  02/12/21 133/86          Passed - Valid encounter within last 6 months    Recent Outpatient Visits           5 months ago Routine general medical examination at a health care facility   [facility], 02/14/21 P, DO   6 months ago Panic disorder with agoraphobia   [facility] [place], Megan P, DO   9 months ago Suspected COVID-19 virus infection   [facility] [place], Alfred, DO   1 year ago Acute right eye pain   [facility] [place], [place], DO   1 year ago Essential hypertension   [facility] [place], [place], DO       Future Appointments             In 4 weeks  Johnson, Megan P, DO [facility], [facility]            Signed Prescriptions Disp Refills   buPROPion (WELLBUTRIN XL) 150 MG 24 hr tablet 90 tablet 0    Sig: TAKE ONE TABLET BY MOUTH ONCE DAILY.     Psychiatry: Antidepressants - bupropion Passed - 07/15/2021  2:58 PM      Passed - Last BP in normal range    BP Readings from Last 1 Encounters:  02/12/21 133/86          Passed - Valid encounter within last 6 months    Recent Outpatient Visits           5 months ago Routine general medical examination at a health care facility   [facility], 02/14/21 P, DO   6 months ago Panic disorder with agoraphobia   [facility] [place], Megan P, DO   9 months ago Suspected COVID-19 virus infection   [facility] [place], [place], DO   1 year ago Acute right eye pain   [facility]  Dorcas [place], DO   1 year ago Essential hypertension   [facility] [place], [place], DO       Future Appointments             In 4 weeks Johnson, Megan P, DO [facility]             metoprolol succinate (TOPROL-XL) 50 MG 24 hr tablet 90 tablet 0    Sig: TAKE ONE TABLET BY MOUTH ONCE DAILY.     Cardiovascular:  Beta Blockers Passed - 07/15/2021  2:58 PM      Passed - Last BP in normal range    BP Readings from Last 1 Encounters:  02/12/21 133/86          Passed - Last Heart Rate in normal range    Pulse Readings from Last 1 Encounters:  02/12/21 68          Passed - Valid encounter within last 6 months    Recent Outpatient Visits           5 months ago Routine general medical examination at a health care facility   [facility] [place] [place], DO   6 months ago Panic disorder with agoraphobia   [facility] [place], Megan P, DO   9 months ago Suspected COVID-19 virus infection   [facility] [place], [place], DO   1 year ago Acute right eye pain    [facility] [place], [place], DO   1 year ago Essential hypertension   [facility] [place], [place], DO       Future Appointments             In 4 weeks Laural Benes, Oralia Rud, DO [facility], [facility]

## 2021-07-16 NOTE — Telephone Encounter (Signed)
Future OV 08/14/21 Per pharmacist, Adam at [facility], the patient's prescriptions have been transferred to this pharmacy.  Wellbutrin xl 150 mg tabs approved per protocol for 90 supply.Toprol xl 50 mg tab approved per protocol for 90 day supply. Requested Prescriptions  Pending Prescriptions Disp Refills  . venlafaxine XR (EFFEXOR-XR) 75 MG 24 hr capsule 30 capsule 6    Sig: Take one capsule by mouth once daily with breakfast. (Take with the 150mg  capsule for a total of 225mg ).     Psychiatry: Antidepressants - SNRI - desvenlafaxine & venlafaxine Failed - 07/15/2021  2:58 PM      Failed - LDL in normal range and within 360 days    LDL Chol Calc (NIH)  Date Value Ref Range Status  02/12/2021 108 (H) 0 - 99 mg/dL Final         Failed - Total Cholesterol in normal range and within 360 days    Cholesterol, Total  Date Value Ref Range Status  02/12/2021 220 (H) 100 - 199 mg/dL Final         Failed - Triglycerides in normal range and within 360 days    Triglycerides  Date Value Ref Range Status  02/12/2021 469 (H) 0 - 149 mg/dL Final         Passed - Last BP in normal range    BP Readings from Last 1 Encounters:  02/12/21 133/86         Passed - Valid encounter within last 6 months    Recent Outpatient Visits          5 months ago Routine general medical examination at a health care facility   [facility], 02/14/21 P, DO   6 months ago Panic disorder with agoraphobia   [facility] [place], Megan P, DO   9 months ago Suspected COVID-19 virus infection   [facility] [place], [place], DO   1 year ago Acute right eye pain   [facility] [place], [place], DO   1 year ago Essential hypertension   [facility] [place], [place], DO      Future Appointments            In 4 weeks Johnson, Megan P, DO [facility], [facility]           . buPROPion (WELLBUTRIN XL) 150 MG 24 hr  tablet 30 tablet 6    Sig: TAKE ONE TABLET BY MOUTH ONCE DAILY.     Psychiatry: Antidepressants - bupropion Passed - 07/15/2021  2:58 PM      Passed - Last BP in normal range    BP Readings from Last 1 Encounters:  02/12/21 133/86         Passed - Valid encounter within last 6 months    Recent Outpatient Visits          5 months ago Routine general medical examination at a health care facility   [facility], 02/14/21 P, DO   6 months ago Panic disorder with agoraphobia   [facility] [place], Megan P, DO   9 months ago Suspected COVID-19 virus infection   [facility] [place], [place], DO   1 year ago Acute right eye pain   [facility] [place], Megan P, DO   1 year ago Essential hypertension   [facility] [place], DO      Future Appointments  In 4 weeks Johnson, Megan P, DO [facility], [facility]           . metoprolol succinate (TOPROL-XL) 50 MG 24 hr tablet 30 tablet 6    Sig: TAKE ONE TABLET BY MOUTH ONCE DAILY.     Cardiovascular:  Beta Blockers Passed - 07/15/2021  2:58 PM      Passed - Last BP in normal range    BP Readings from Last 1 Encounters:  02/12/21 133/86         Passed - Last Heart Rate in normal range    Pulse Readings from Last 1 Encounters:  02/12/21 68         Passed - Valid encounter within last 6 months    Recent Outpatient Visits          5 months ago Routine general medical examination at a health care facility   [facility] [place] P, DO   6 months ago Panic disorder with agoraphobia   [facility] [place], Megan P, DO   9 months ago Suspected COVID-19 virus infection   [facility] [place], [place], DO   1 year ago Acute right eye pain   [facility] [place], [place], DO   1 year ago Essential hypertension   [facility] [place], [place], DO      Future Appointments             In 4 weeks Laural Benes, Oralia Rud, DO [facility], [facility]

## 2021-07-16 NOTE — Telephone Encounter (Signed)
Called new pharmacy-Needed to verify the requested refill medications have already been received. Left VM to return call with information. Will try new pharmacy again today if no call received.

## 2021-07-20 ENCOUNTER — Other Ambulatory Visit: Payer: Self-pay

## 2021-07-30 ENCOUNTER — Other Ambulatory Visit: Payer: Self-pay

## 2021-07-30 MED ORDER — SILDENAFIL CITRATE 100 MG PO TABS
100.0000 mg | ORAL_TABLET | ORAL | 10 refills | Status: DC
  Filled 2021-07-30: qty 5, 30d supply, fill #0

## 2021-07-30 MED ORDER — TRAZODONE HCL 50 MG PO TABS
50.0000 mg | ORAL_TABLET | ORAL | 1 refills | Status: DC
  Filled 2021-07-30: qty 30, 30d supply, fill #0

## 2021-07-31 ENCOUNTER — Other Ambulatory Visit: Payer: Self-pay

## 2021-08-07 ENCOUNTER — Other Ambulatory Visit: Payer: Self-pay

## 2021-08-14 ENCOUNTER — Ambulatory Visit (INDEPENDENT_AMBULATORY_CARE_PROVIDER_SITE_OTHER): Payer: Self-pay | Admitting: Family Medicine

## 2021-08-14 ENCOUNTER — Other Ambulatory Visit: Payer: Self-pay | Admitting: Family Medicine

## 2021-08-14 ENCOUNTER — Encounter: Payer: Self-pay | Admitting: Family Medicine

## 2021-08-14 ENCOUNTER — Other Ambulatory Visit: Payer: Self-pay

## 2021-08-14 VITALS — BP 130/83 | HR 69 | Temp 99.1°F | Ht 69.49 in | Wt 286.6 lb

## 2021-08-14 DIAGNOSIS — N289 Disorder of kidney and ureter, unspecified: Secondary | ICD-10-CM

## 2021-08-14 DIAGNOSIS — E782 Mixed hyperlipidemia: Secondary | ICD-10-CM

## 2021-08-14 DIAGNOSIS — F4001 Agoraphobia with panic disorder: Secondary | ICD-10-CM

## 2021-08-14 DIAGNOSIS — I10 Essential (primary) hypertension: Secondary | ICD-10-CM

## 2021-08-14 MED ORDER — VENLAFAXINE HCL ER 150 MG PO CP24: 150.0000 mg | ORAL_CAPSULE | Freq: Every day | ORAL | 6 refills | Status: DC

## 2021-08-14 MED ORDER — BUPROPION HCL ER (XL) 300 MG PO TB24: 300.0000 mg | ORAL_TABLET | Freq: Every day | ORAL | 6 refills | Status: DC

## 2021-08-14 MED ORDER — METOPROLOL SUCCINATE ER 50 MG PO TB24: ORAL_TABLET | ORAL | 6 refills | Status: DC

## 2021-08-14 MED ORDER — VENLAFAXINE HCL ER 75 MG PO CP24: ORAL_CAPSULE | ORAL | 6 refills | Status: DC

## 2021-08-14 MED ORDER — BUPROPION HCL ER (XL) 150 MG PO TB24: ORAL_TABLET | ORAL | 6 refills | Status: DC

## 2021-08-14 NOTE — Assessment & Plan Note (Signed)
Not doing well. Will increase his wellbutrin to 300 and check in by phone in 1 month. Call with any concerns. Continue effexor.

## 2021-08-14 NOTE — Assessment & Plan Note (Signed)
Under good control on current regimen. Continue current regimen. Continue to monitor. Call with any concerns. Refills given. BMP checked today. ° °

## 2021-08-14 NOTE — Assessment & Plan Note (Signed)
Rechecking labs today. Await results. Treat as needed.  °

## 2021-08-14 NOTE — Progress Notes (Signed)
BP 130/83    Pulse 69    Temp 99.1 F (37.3 C) (Oral)    Ht 5' 9.49" (1.765 m)    Wt 286 lb 9.6 oz (130 kg)    SpO2 96%    BMI 41.73 kg/m    Subjective:    Patient ID: Brady Edwards, male    DOB: 1965/10/30, 55 y.o.   MRN: [id number]  HPI: Brady Edwards is a 55 y.o. male  Chief Complaint  Patient presents with   Hypertension   Depression   HYPERTENSION / [place] Satisfied with current treatment? yes Duration of hypertension: chronic BP monitoring frequency: not checking BP medication side effects: no Past BP meds: metoprolol Duration of hyperlipidemia: chronic Cholesterol medication side effects: not on anything Cholesterol supplements: none Past cholesterol medications: none Medication compliance: good compliance Aspirin: no Recent stressors: yes Recurrent headaches: no Visual changes: no Palpitations: yes Dyspnea: no Chest pain: no Lower extremity edema: no Dizzy/lightheaded: no  DEPRESSION Mood status: exacerbated Satisfied with current treatment?: no Symptom severity: moderate  Duration of current treatment : chronic Side effects: no Medication compliance: excellent compliance Psychotherapy/counseling: no  Previous psychiatric medications: effexor, wellbutrin Depressed mood: yes Anxious mood: yes Anhedonia: no Significant weight loss or gain: no Insomnia: no  Fatigue: yes Feelings of worthlessness or guilt: no Impaired concentration/indecisiveness: no Suicidal ideations: no Hopelessness: yes Crying spells: no Depression screen [facility] 2/9 08/14/2021 02/12/2021 01/12/2021 09/30/2020 08/02/2019  Decreased Interest '3 2 3 ' 0 3  Down, Depressed, Hopeless '3 2 1 ' 0 0  PHQ - 2 Score '6 4 4 ' 0 3  Altered sleeping '2 3 3 ' 0 0  Tired, decreased energy 0 3 3 0 3  Change in appetite 0 2 3 0 3  Feeling bad or failure about yourself  '3 2 2 ' 0 3  Trouble concentrating 2 0 2 0 0  Moving slowly or fidgety/restless 0 0 1 0 0  Suicidal thoughts 0 0 0 0 0  PHQ-9  Score '13 14 18 ' 0 12  Difficult doing work/chores - Somewhat difficult Extremely dIfficult Not difficult at all Somewhat difficult  Some recent data might be hidden    Relevant past medical, surgical, family and social history reviewed and updated as indicated. Interim medical history since our last visit reviewed. Allergies and medications reviewed and updated.  Review of Systems  Constitutional: Negative.   Eyes: Negative.   Respiratory: Negative.    Cardiovascular: Negative.   Gastrointestinal: Negative.   Psychiatric/Behavioral:  Positive for dysphoric mood. Negative for agitation, behavioral problems, confusion, decreased concentration, hallucinations, self-injury, sleep disturbance and suicidal ideas. The patient is nervous/anxious. The patient is not hyperactive.    Per HPI unless specifically indicated above     Objective:    BP 130/83    Pulse 69    Temp 99.1 F (37.3 C) (Oral)    Ht 5' 9.49" (1.765 m)    Wt 286 lb 9.6 oz (130 kg)    SpO2 96%    BMI 41.73 kg/m   Wt Readings from Last 3 Encounters:  08/14/21 286 lb 9.6 oz (130 kg)  02/12/21 295 lb 3.2 oz (133.9 kg)  01/12/21 297 lb 6.4 oz (134.9 kg)    Physical Exam Vitals and nursing note reviewed.  Constitutional:      General: He is not in acute distress.    Appearance: Normal appearance. He is not ill-appearing, toxic-appearing or diaphoretic.  HENT:     Head: Normocephalic and atraumatic.  Right Ear: External ear normal.     Left Ear: External ear normal.     Nose: Nose normal.     Mouth/Throat:     Mouth: Mucous membranes are moist.     Pharynx: Oropharynx is clear.  Eyes:     General: No scleral icterus.       Right eye: No discharge.        Left eye: No discharge.     Extraocular Movements: Extraocular movements intact.     Conjunctiva/sclera: Conjunctivae normal.     Pupils: Pupils are equal, round, and reactive to light.  Cardiovascular:     Rate and Rhythm: Normal rate and regular rhythm.      Pulses: Normal pulses.     Heart sounds: Normal heart sounds. No murmur heard.   No friction rub. No gallop.  Pulmonary:     Effort: Pulmonary effort is normal. No respiratory distress.     Breath sounds: Normal breath sounds. No stridor. No wheezing, rhonchi or rales.  Chest:     Chest wall: No tenderness.  Musculoskeletal:        General: Normal range of motion.     Cervical back: Normal range of motion and neck supple.  Skin:    General: Skin is warm and dry.     Capillary Refill: Capillary refill takes less than 2 seconds.     Coloration: Skin is not jaundiced or pale.     Findings: No bruising, erythema, lesion or rash.  Neurological:     General: No focal deficit present.     Mental Status: He is alert and oriented to person, place, and time. Mental status is at baseline.  Psychiatric:        Mood and Affect: Mood normal.        Behavior: Behavior normal.        Thought Content: Thought content normal.        Judgment: Judgment normal.    Results for orders placed or performed in visit on 02/12/21  Microscopic Examination   Urine  Result Value Ref Range   WBC, UA None seen 0 - 5 /hpf   RBC 0-2 0 - 2 /hpf   Epithelial Cells (non renal) None seen 0 - 10 /hpf   Mucus, UA Present (A) Not Estab.   Bacteria, UA None seen None seen/Few  Comprehensive metabolic panel  Result Value Ref Range   Glucose 95 65 - 99 mg/dL   BUN 26 (H) 6 - 24 mg/dL   Creatinine, Ser 1.84 (H) 0.76 - 1.27 mg/dL   eGFR 43 (L) >59 mL/min/1.73   BUN/Creatinine Ratio 14 9 - 20   Sodium 140 134 - 144 mmol/L   Potassium 4.3 3.5 - 5.2 mmol/L   Chloride 105 96 - 106 mmol/L   CO2 17 (L) 20 - 29 mmol/L   Calcium 9.2 8.7 - 10.2 mg/dL   Total Protein 6.7 6.0 - 8.5 g/dL   Albumin 4.1 3.8 - 4.9 g/dL   Globulin, Total 2.6 1.5 - 4.5 g/dL   Albumin/Globulin Ratio 1.6 1.2 - 2.2   Bilirubin Total <0.2 0.0 - 1.2 mg/dL   Alkaline Phosphatase 129 (H) 44 - 121 IU/L   AST 18 0 - 40 IU/L   ALT 27 0 - 44 IU/L  CBC  with Differential/Platelet  Result Value Ref Range   WBC 10.0 3.4 - 10.8 x10E3/uL   RBC 5.20 4.14 - 5.80 x10E6/uL   Hemoglobin 16.3 13.0 - 17.7 g/dL  Hematocrit 47.9 37.5 - 51.0 %   MCV 92 79 - 97 fL   MCH 31.3 26.6 - 33.0 pg   MCHC 34.0 31.5 - 35.7 g/dL   RDW 13.2 11.6 - 15.4 %   Platelets 252 150 - 450 x10E3/uL   Neutrophils 59 Not Estab. %   Lymphs 27 Not Estab. %   Monocytes 9 Not Estab. %   Eos 3 Not Estab. %   Basos 1 Not Estab. %   Neutrophils Absolute 6.0 1.4 - 7.0 x10E3/uL   Lymphocytes Absolute 2.7 0.7 - 3.1 x10E3/uL   Monocytes Absolute 0.9 0.1 - 0.9 x10E3/uL   EOS (ABSOLUTE) 0.3 0.0 - 0.4 x10E3/uL   Basophils Absolute 0.1 0.0 - 0.2 x10E3/uL   Immature Granulocytes 1 Not Estab. %   Immature Grans (Abs) 0.1 0.0 - 0.1 x10E3/uL  Lipid Panel w/o Chol/HDL Ratio  Result Value Ref Range   Cholesterol, Total 220 (H) 100 - 199 mg/dL   Triglycerides 469 (H) 0 - 149 mg/dL   HDL 31 (L) >39 mg/dL   VLDL Cholesterol Cal 81 (H) 5 - 40 mg/dL   LDL Chol Calc (NIH) 108 (H) 0 - 99 mg/dL  PSA  Result Value Ref Range   Prostate Specific Ag, Serum 0.9 0.0 - 4.0 ng/mL  TSH  Result Value Ref Range   TSH 2.130 0.450 - 4.500 uIU/mL  Urinalysis, Routine w reflex microscopic  Result Value Ref Range   Specific Gravity, UA >1.030 (H) 1.005 - 1.030   pH, UA 5.5 5.0 - 7.5   Color, UA Yellow Yellow   Appearance Ur Clear Clear   Leukocytes,UA Negative Negative   Protein,UA 2+ (A) Negative/Trace   Glucose, UA Negative Negative   Ketones, UA Negative Negative   RBC, UA Trace (A) Negative   Bilirubin, UA Negative Negative   Urobilinogen, Ur 0.2 0.2 - 1.0 mg/dL   Nitrite, UA Negative Negative   Microscopic Examination See below:   Microalbumin, Urine Waived  Result Value Ref Range   Microalb, Ur Waived 150 (H) 0 - 19 mg/L   Creatinine, Urine Waived 200 10 - 300 mg/dL   Microalb/Creat Ratio 30-300 (H) <30 mg/g      Assessment & Plan:   Problem List Items Addressed This Visit        Cardiovascular and Mediastinum   Hypertension - Primary    Under good control on current regimen. Continue current regimen. Continue to monitor. Call with any concerns. Refills given. BMP checked today.       Relevant Medications   metoprolol succinate (TOPROL-XL) 50 MG 24 hr tablet     Other   Panic disorder with agoraphobia    Not doing well. Will increase his wellbutrin to 300 and check in by phone in 1 month. Call with any concerns. Continue effexor.       Relevant Medications   venlafaxine XR (EFFEXOR-XR) 75 MG 24 hr capsule   venlafaxine XR (EFFEXOR-XR) 150 MG 24 hr capsule   buPROPion (WELLBUTRIN XL) 300 MG 24 hr tablet   Hyperlipidemia    Rechecking labs today. Await results. Treat as needed.       Relevant Medications   metoprolol succinate (TOPROL-XL) 50 MG 24 hr tablet   Other Visit Diagnoses     Abnormal kidney function       Rechecking labs today. Await results. Treat as needed.         Follow up plan: Return 1 month by phone.

## 2021-08-15 LAB — BASIC METABOLIC PANEL
BUN/Creatinine Ratio: 8 — ABNORMAL LOW (ref 9–20)
BUN: 14 mg/dL (ref 6–24)
CO2: 18 mmol/L — ABNORMAL LOW (ref 20–29)
Calcium: 9.2 mg/dL (ref 8.7–10.2)
Chloride: 105 mmol/L (ref 96–106)
Creatinine, Ser: 1.76 mg/dL — ABNORMAL HIGH (ref 0.76–1.27)
Glucose: 99 mg/dL (ref 70–99)
Potassium: 4.5 mmol/L (ref 3.5–5.2)
Sodium: 140 mmol/L (ref 134–144)
eGFR: 45 mL/min/{1.73_m2} — ABNORMAL LOW (ref >59)

## 2021-08-15 NOTE — Telephone Encounter (Signed)
All requested med were already refilled 08/14/21  REQUESTING TOO SOON  Requested Prescriptions  Refused Prescriptions Disp Refills   venlafaxine XR (EFFEXOR-XR) 75 MG 24 hr capsule [Pharmacy Med Name: VENLAFAXINE HCL ER 75 MG CAP] 30 capsule 0    Sig: Take 1 capsule (75 mg total) by mouth daily with breakfast. (to be taken with the 150mg  for a total of 225mg  daily)     Psychiatry: Antidepressants - SNRI - desvenlafaxine & venlafaxine Failed - 08/14/2021 12:34 PM      Failed - LDL in normal range and within 360 days    LDL Chol Calc (NIH)  Date Value Ref Range Status  02/12/2021 108 (H) 0 - 99 mg/dL Final         Failed - Total Cholesterol in normal range and within 360 days    Cholesterol, Total  Date Value Ref Range Status  02/12/2021 220 (H) 100 - 199 mg/dL Final         Failed - Triglycerides in normal range and within 360 days    Triglycerides  Date Value Ref Range Status  02/12/2021 469 (H) 0 - 149 mg/dL Final         Failed - Valid encounter within last 6 months    Recent Outpatient Visits          Yesterday Primary hypertension   [facility], Megan P, DO   6 months ago Routine general medical examination at a health care facility   [facility], [place] P, DO   7 months ago Panic disorder with agoraphobia   [facility] [place], Megan P, DO   10 months ago Suspected COVID-19 virus infection   [facility] [place], [place], DO   1 year ago Acute right eye pain   [facility] Johnson, Megan P, DO             Passed - Last BP in normal range    BP Readings from Last 1 Encounters:  08/14/21 130/83          buPROPion (WELLBUTRIN XL) 150 MG 24 hr tablet [Pharmacy Med Name: BUPROPION HCL XL 150 MG TABLET] 30 tablet 0    Sig: Take 1 tablet (150 mg total) by mouth daily.     Psychiatry: Antidepressants - bupropion Failed - 08/14/2021 12:34 PM      Failed - Valid encounter within last 6  months    Recent Outpatient Visits          Yesterday Primary hypertension   [facility] [place], Megan P, DO   6 months ago Routine general medical examination at a health care facility   [facility], [place] P, DO   7 months ago Panic disorder with agoraphobia   [facility] [place], Megan P, DO   10 months ago Suspected COVID-19 virus infection   [facility] [place], [place], DO   1 year ago Acute right eye pain   [facility] Johnson, Megan P, DO             Passed - Last BP in normal range    BP Readings from Last 1 Encounters:  08/14/21 130/83          venlafaxine XR (EFFEXOR-XR) 150 MG 24 hr capsule [Pharmacy Med Name: VENLAFAXINE HCL ER 150 MG CAP] 30 capsule 0    Sig: Take 1 capsule (150 mg total)by mouth daily with breakfast. (to be taken with  the 75mg  for a total of 225mg  daily)     Psychiatry: Antidepressants - SNRI - desvenlafaxine & venlafaxine Failed - 08/14/2021 12:34 PM      Failed - LDL in normal range and within 360 days    LDL Chol Calc (NIH)  Date Value Ref Range Status  02/12/2021 108 (H) 0 - 99 mg/dL Final         Failed - Total Cholesterol in normal range and within 360 days    Cholesterol, Total  Date Value Ref Range Status  02/12/2021 220 (H) 100 - 199 mg/dL Final         Failed - Triglycerides in normal range and within 360 days    Triglycerides  Date Value Ref Range Status  02/12/2021 469 (H) 0 - 149 mg/dL Final         Failed - Valid encounter within last 6 months    Recent Outpatient Visits          Yesterday Primary hypertension   [facility] [place], Megan P, DO   6 months ago Routine general medical examination at a health care facility   [facility], [place] P, DO   7 months ago Panic disorder with agoraphobia   [facility] [place], Megan P, DO   10 months ago Suspected COVID-19 virus infection   [facility] [place], [place], DO   1 year ago Acute right eye pain   [facility] Johnson, Megan P, DO             Passed - Last BP in normal range    BP Readings from Last 1 Encounters:  08/14/21 130/83          metoprolol succinate (TOPROL-XL) 50 MG 24 hr tablet [Pharmacy Med Name: METOPROLOL SUCC ER 50 MG TAB] 30 tablet 0    Sig: Take 1 tablet (50 mg total) by mouth daily.     Cardiovascular:  Beta Blockers Failed - 08/14/2021 12:34 PM      Failed - Valid encounter within last 6 months    Recent Outpatient Visits          Yesterday Primary hypertension   [facility], Megan P, DO   6 months ago Routine general medical examination at a health care facility   [facility], [place] P, DO   7 months ago Panic disorder with agoraphobia   [facility], Megan P, DO   10 months ago Suspected COVID-19 virus infection   [facility] [place], [place], DO   1 year ago Acute right eye pain   [facility] [employer], Megan P, DO             Passed - Last BP in normal range    BP Readings from Last 1 Encounters:  08/14/21 130/83         Passed - Last Heart Rate in normal range    Pulse Readings from Last 1 Encounters:  08/14/21 69

## 2021-08-18 ENCOUNTER — Encounter: Payer: Self-pay | Admitting: Family Medicine

## 2021-08-19 ENCOUNTER — Other Ambulatory Visit: Payer: Self-pay

## 2021-09-01 ENCOUNTER — Other Ambulatory Visit: Payer: Self-pay

## 2021-09-14 ENCOUNTER — Telehealth: Payer: Self-pay | Admitting: Family Medicine

## 2021-09-14 NOTE — Telephone Encounter (Signed)
Can we please check in on how is mood is doing?

## 2021-09-14 NOTE — Telephone Encounter (Signed)
-----   Message from Dorcas Carrow, [place] sent at 08/14/2021  4:54 PM EST ----- Check in on his mood

## 2021-09-15 NOTE — Telephone Encounter (Signed)
Attempted to contact patient, phone number has been disconnected.

## 2021-10-05 ENCOUNTER — Other Ambulatory Visit: Payer: Self-pay

## 2021-11-11 ENCOUNTER — Other Ambulatory Visit: Payer: Self-pay

## 2021-11-13 ENCOUNTER — Other Ambulatory Visit: Payer: Self-pay | Admitting: Family Medicine

## 2021-11-13 MED ORDER — VENLAFAXINE HCL ER 150 MG PO CP24: 150.0000 mg | ORAL_CAPSULE | Freq: Every day | ORAL | 3 refills | Status: DC

## 2021-11-13 MED ORDER — METOPROLOL SUCCINATE ER 50 MG PO TB24: ORAL_TABLET | ORAL | 3 refills | Status: DC

## 2021-11-13 MED ORDER — VENLAFAXINE HCL ER 75 MG PO CP24: ORAL_CAPSULE | ORAL | 3 refills | Status: DC

## 2021-11-13 MED ORDER — BUPROPION HCL ER (XL) 300 MG PO TB24: 300.0000 mg | ORAL_TABLET | Freq: Every day | ORAL | 3 refills | Status: DC

## 2021-11-13 NOTE — Telephone Encounter (Signed)
Change in pharmacy- remainder of Rx sent to new pharmacy as requested ?Requested Prescriptions  ?Pending Prescriptions Disp Refills  ?? venlafaxine XR (EFFEXOR-XR) 150 MG 24 hr capsule 30 capsule 3  ?  Sig: Take 1 capsule (150 mg total) by mouth daily with breakfast. (to be taken with the 75mg  for a total of 225mg  daily)  ?  ? Psychiatry: Antidepressants - SNRI - desvenlafaxine & venlafaxine Failed - 11/13/2021  9:54 AM  ?  ?  Failed - Cr in normal range and within 360 days  ?  Creatinine, Ser  ?Date Value Ref Range Status  ?08/14/2021 1.76 (H) 0.76 - 1.27 mg/dL Final  ?   ?  ?  Failed - Lipid Panel in normal range within the last 12 months  ?  Cholesterol, Total  ?Date Value Ref Range Status  ?02/12/2021 220 (H) 100 - 199 mg/dL Final  ? ?LDL Chol Calc (NIH)  ?Date Value Ref Range Status  ?02/12/2021 108 (H) 0 - 99 mg/dL Final  ? ?HDL  ?Date Value Ref Range Status  ?02/12/2021 31 (L) >39 mg/dL Final  ? ?Triglycerides  ?Date Value Ref Range Status  ?02/12/2021 469 (H) 0 - 149 mg/dL Final  ? ?  ?  ?  Passed - Last BP in normal range  ?  BP Readings from Last 1 Encounters:  ?08/14/21 130/83  ?   ?  ?  Passed - Valid encounter within last 6 months  ?  Recent Outpatient Visits   ?      ? 3 months ago Primary hypertension  ? [facility] [place], Megan P, DO  ? 9 months ago Routine general medical examination at a health care facility  ? [facility] [place], Megan P, DO  ? 10 months ago Panic disorder with agoraphobia  ? [facility] [place], Megan P, DO  ? 1 year ago Suspected COVID-19 virus infection  ? [name] [place], Megan P, DO  ? 1 year ago Acute right eye pain  ? [facility] [place], [facility] P, DO  ?  ?  ? ?  ?  ?  ?? venlafaxine XR (EFFEXOR-XR) 75 MG 24 hr capsule 30 capsule 3  ?  Sig: Take 1 capsule (75 mg total) by mouth daily with breakfast. (to be taken with the 150mg  for a total of 225mg  daily)  ?  ? Psychiatry: Antidepressants - SNRI -  desvenlafaxine & venlafaxine Failed - 11/13/2021  9:54 AM  ?  ?  Failed - Cr in normal range and within 360 days  ?  Creatinine, Ser  ?Date Value Ref Range Status  ?08/14/2021 1.76 (H) 0.76 - 1.27 mg/dL Final  ?   ?  ?  Failed - Lipid Panel in normal range within the last 12 months  ?  Cholesterol, Total  ?Date Value Ref Range Status  ?02/12/2021 220 (H) 100 - 199 mg/dL Final  ? ?LDL Chol Calc (NIH)  ?Date Value Ref Range Status  ?02/12/2021 108 (H) 0 - 99 mg/dL Final  ? ?HDL  ?Date Value Ref Range Status  ?02/12/2021 31 (L) >39 mg/dL Final  ? ?Triglycerides  ?Date Value Ref Range Status  ?02/12/2021 469 (H) 0 - 149 mg/dL Final  ? ?  ?  ?  Passed - Last BP in normal range  ?  BP Readings from Last 1 Encounters:  ?08/14/21 130/83  ?   ?  ?  Passed - Valid encounter within last 6 months  ?  Recent Outpatient Visits   ?      ? 3 months ago [facility]  ? [facility] [place] [place]  ? 9 months ago Routine general medical examination at a health care facility  ? [facility] [place]  ? 10 months ago Panic disorder with agoraphobia  ? [facility] [place], [facility]  ? 1 year ago Suspected COVID-19 virus infection  ? [facility] [place]  ? 1 year ago Acute right eye pain  ? [facility] [place], [place]  ?  ?  ? ?  ?  ?  ?? buPROPion (WELLBUTRIN XL) 300 MG 24 hr tablet 30 tablet 3  ?  Sig: Take 1 tablet (300 mg total) by mouth daily.  ?  ? Psychiatry: Antidepressants - bupropion Failed - 11/13/2021  9:54 AM  ?  ?  Failed - Cr in normal range and within 360 days  ?  Creatinine, Ser  ?Date Value Ref Range Status  ?08/14/2021 1.76 (H) 0.76 - 1.27 mg/dL Final  ?   ?  ?  Passed - AST in normal range and within 360 days  ?  AST  ?Date Value Ref Range Status  ?02/12/2021 18 0 - 40 IU/L Final  ?   ?  ?  Passed - ALT in normal range and within 360 days  ?  ALT  ?Date Value Ref Range Status  ?02/12/2021 27 0 - 44 IU/L Final  ?    ?  ?  Passed - Last BP in normal range  ?  BP Readings from Last 1 Encounters:  ?08/14/21 130/83  ?   ?  ?  Passed - Valid encounter within last 6 months  ?  Recent Outpatient Visits   ?      ? 3 months ago [facility] hypertension  ? [facility] [place], [facility]  ? 9 months ago Routine general medical examination at a health care facility  ? [facility] [place] [place], [facility]  ? 10 months ago Panic disorder with agoraphobia  ? [facility] [place] P, DO  ? 1 year ago Suspected COVID-19 virus infection  ? [facility] [place] [place], DO  ? 1 year ago Acute right eye pain  ? [facility] [place], [place], DO  ?  ?  ? ?  ?  ?  ?? metoprolol succinate (TOPROL-XL) 50 MG 24 hr tablet 30 tablet 3  ?  Sig: TAKE ONE TABLET BY MOUTH ONCE DAILY.  ?  ? Cardiovascular:  Beta Blockers Passed - 11/13/2021  9:54 AM  ?  ?  Passed - Last BP in normal range  ?  BP Readings from Last 1 Encounters:  ?08/14/21 130/83  ?   ?  ?  Passed - Last Heart Rate in normal range  ?  Pulse Readings from Last 1 Encounters:  ?08/14/21 69  ?   ?  ?  Passed - Valid encounter within last 6 months  ?  Recent Outpatient Visits   ?      ? 3 months ago [facility] [place] [place]  ? 9 months ago Routine general medical examination at a health care facility  ? [facility] [place] [place], DO  ? 10 months ago Panic disorder with agoraphobia  ? [facility] [place], DO  ?  1 year ago Suspected COVID-19 virus infection  ? [facility] [place], Megan P, DO  ? 1 year ago Acute right eye pain  ? [facility] [place], [place] P, DO  ?  ?  ? ?  ?  ?  ? ? ?

## 2021-11-13 NOTE — Telephone Encounter (Signed)
Pt would like to have his medications switched to [facility]. ?Pt took last pill today. [facility] told him they could not guarantee they could have this done by today. Pt hopes we can. ?metoprolol succinate (TOPROL-XL) 50 MG 24 hr tablet ?buPROPion (WELLBUTRIN XL) 300 MG 24 hr tablet ?venlafaxine XR (EFFEXOR-XR) 150 MG 24 hr capsule ?venlafaxine XR (EFFEXOR-XR) 75 MG 24 hr capsule ? ?[id number] - [place] ([address]), [place] - [address] ?

## 2021-12-15 ENCOUNTER — Telehealth: Payer: Self-pay | Admitting: Pharmacist

## 2021-12-15 NOTE — Telephone Encounter (Signed)
Patient failed to provide requested 2023 financial documentation. No additional medication assistance will be provided by [facility] without the required proof of income documentation. Patient notified by letter. ?Debra Cheek ?Administrative Assistant ?[facility] ? ?

## 2021-12-21 ENCOUNTER — Other Ambulatory Visit: Payer: Self-pay

## 2022-03-16 ENCOUNTER — Telehealth: Payer: Self-pay | Admitting: Family Medicine

## 2022-03-17 ENCOUNTER — Other Ambulatory Visit: Payer: Self-pay | Admitting: Family Medicine

## 2022-03-17 MED ORDER — VENLAFAXINE HCL ER 75 MG PO CP24: ORAL_CAPSULE | ORAL | 0 refills | Status: DC

## 2022-03-17 MED ORDER — METOPROLOL SUCCINATE ER 50 MG PO TB24: 50.0000 mg | ORAL_TABLET | Freq: Every day | ORAL | 0 refills | Status: DC

## 2022-03-17 MED ORDER — VENLAFAXINE HCL ER 150 MG PO CP24: ORAL_CAPSULE | ORAL | 0 refills | Status: DC

## 2022-03-17 NOTE — Telephone Encounter (Addendum)
Patient called in stating he is completely out of his medication he called in for his refill yesterday morning. The 2 prescriptions are venlafaxine XR (EFFEXOR-XR) 150 MG 24 hr capsule, venlafaxine XR (EFFEXOR-XR) 75 MG 24 hr capsule and metoprolol succinate (TOPROL-XL) 50 MG 24 hr tablet. Please assist patient further  Patient wants to make sure this is for the 30 days not 90 days as the 90 days are too expensive right now for him

## 2022-03-17 NOTE — Telephone Encounter (Addendum)
Pt called in to follow up on refill request for medications. Pt stated he is out of his medication and it needs to be sent to [facility] [place] - [address]  [facility] [place] - [address]  [address] [place] [address]  Phone: [phone] Fax: [phone]  Hours: Not open 24 hours

## 2022-03-17 NOTE — Telephone Encounter (Signed)
Called, spoke with pt and scheduled OV. Will refill medications for 30 day, per pt request. No additional refills, pt must come to future OV.  Requested Prescriptions  Pending Prescriptions Disp Refills  . venlafaxine XR (EFFEXOR-XR) 75 MG 24 hr capsule [Pharmacy Med Name: Venlafaxine HCl ER 75 MG Oral Capsule Extended Release 24 Hour] 30 capsule 0    Sig: TAKE 1 CAPSULE BY MOUTH ONCE DAILY WITH BREAKFAST (TO BE TAKEN WITH THE 150 MG FOR A TOTAL OF 225 MG DAILY)     Psychiatry: Antidepressants - SNRI - desvenlafaxine & venlafaxine Failed - 03/17/2022 10:27 AM      Failed - Cr in normal range and within 360 days    Creatinine, Ser  Date Value Ref Range Status  08/14/2021 1.76 (H) 0.76 - 1.27 mg/dL Final         Failed - Valid encounter within last 6 months    Recent Outpatient Visits          7 months ago Primary hypertension   [facility] [place], Megan P, DO   1 year ago Routine general medical examination at a health care facility   [facility], [place] P, DO   1 year ago Panic disorder with agoraphobia   [facility] [place], Megan P, DO   1 year ago Suspected COVID-19 virus infection   [facility] [place], [place], DO   2 years ago Acute right eye pain   [facility] [place], [place], DO      Future Appointments            In 1 week Johnson, Megan P, DO [facility], [facility]           Failed - Lipid Panel in normal range within the last 12 months    Cholesterol, Total  Date Value Ref Range Status  02/12/2021 220 (H) 100 - 199 mg/dL Final   LDL Chol Calc (NIH)  Date Value Ref Range Status  02/12/2021 108 (H) 0 - 99 mg/dL Final   HDL  Date Value Ref Range Status  02/12/2021 31 (L) >39 mg/dL Final   Triglycerides  Date Value Ref Range Status  02/12/2021 469 (H) 0 - 149 mg/dL Final         Passed - Last BP in normal range    BP Readings from Last 1 Encounters:  08/14/21 130/83          . metoprolol succinate (TOPROL-XL) 50 MG 24 hr tablet [Pharmacy Med Name: Metoprolol Succinate ER 50 MG Oral Tablet Extended Release 24 Hour] 30 tablet 0    Sig: Take 1 tablet by mouth once daily     Cardiovascular:  Beta Blockers Failed - 03/17/2022 10:27 AM      Failed - Valid encounter within last 6 months    Recent Outpatient Visits          7 months ago Primary hypertension   [facility] [place], Megan P, DO   1 year ago Routine general medical examination at a health care facility   [facility], [place] P, DO   1 year ago Panic disorder with agoraphobia   [facility] [place], Megan P, DO   1 year ago Suspected COVID-19 virus infection   [facility] [place], [place], DO   2 years ago Acute right eye pain   [facility] Dorcas [place], DO      Future Appointments  In 1 week Johnson, Megan P, DO [facility], [facility]           Passed - Last BP in normal range    BP Readings from Last 1 Encounters:  08/14/21 130/83         Passed - Last Heart Rate in normal range    Pulse Readings from Last 1 Encounters:  08/14/21 69         . venlafaxine XR (EFFEXOR-XR) 150 MG 24 hr capsule [Pharmacy Med Name: Venlafaxine HCl ER 150 MG Oral Capsule Extended Release 24 Hour] 30 capsule 0    Sig: TAKE 1 CAPSULE BY MOUTH ONCE DAILY WITH BREAKFAST (TO BE TAKEN WITH THE 75 MG FOR A TOTAL OF 225 MG DAILY)     Psychiatry: Antidepressants - SNRI - desvenlafaxine & venlafaxine Failed - 03/17/2022 10:27 AM      Failed - Cr in normal range and within 360 days    Creatinine, Ser  Date Value Ref Range Status  08/14/2021 1.76 (H) 0.76 - 1.27 mg/dL Final         Failed - Valid encounter within last 6 months    Recent Outpatient Visits          7 months ago Primary hypertension   [facility] [place], Megan P, DO   1 year ago Routine general medical examination at a health care facility    [facility], [place] P, DO   1 year ago Panic disorder with agoraphobia   [facility] [place], Megan P, DO   1 year ago Suspected COVID-19 virus infection   [facility] [place], [place], DO   2 years ago Acute right eye pain   [facility] [place], [place], DO      Future Appointments            In 1 week Johnson, Megan P, DO [facility], [facility]           Failed - Lipid Panel in normal range within the last 12 months    Cholesterol, Total  Date Value Ref Range Status  02/12/2021 220 (H) 100 - 199 mg/dL Final   LDL Chol Calc (NIH)  Date Value Ref Range Status  02/12/2021 108 (H) 0 - 99 mg/dL Final   HDL  Date Value Ref Range Status  02/12/2021 31 (L) >39 mg/dL Final   Triglycerides  Date Value Ref Range Status  02/12/2021 469 (H) 0 - 149 mg/dL Final         Passed - Last BP in normal range    BP Readings from Last 1 Encounters:  08/14/21 130/83           .

## 2022-03-17 NOTE — Telephone Encounter (Signed)
Pt called in to follow up on refill request for medications. Pt says that he made CMA aware to send requested medications to  [facility] - [place], [place] - [address] Phone:  [phone]  Fax:  [phone]      Made pt aware that Rx's were sent to [facility] instead. Pt would like further assistance updating this and having meds sent to [facility].   Please assist pt further.

## 2022-03-17 NOTE — Addendum Note (Signed)
Addended by: Nena Polio on: 03/17/2022 02:10 PM   Modules accepted: Orders

## 2022-03-17 NOTE — Telephone Encounter (Signed)
Medication Refill - Medication: buPROPion (WELLBUTRIN XL) 300 MG 24 hr tablet  Has the patient contacted their pharmacy? Yes.   (Agent: If no, request that the patient contact the pharmacy for the refill. If patient does not wish to contact the pharmacy document the reason why and proceed with request.) (Agent: If yes, when and what did the pharmacy advise?)  Preferred Pharmacy (with phone number or street name):   [facility] [place] - [address] Phone:  [phone]  Fax:  [phone]     Has the patient been seen for an appointment in the last year OR does the patient have an upcoming appointment? Yes.    Patient needs a 30 day and not 90 day due to expenses

## 2022-03-18 MED ORDER — BUPROPION HCL ER (XL) 300 MG PO TB24: 300.0000 mg | ORAL_TABLET | Freq: Every day | ORAL | 0 refills | Status: DC

## 2022-03-18 NOTE — Telephone Encounter (Signed)
Patient states he only has 5 pills left.

## 2022-03-18 NOTE — Telephone Encounter (Signed)
Does he have enough to get to his appointment? 

## 2022-03-18 NOTE — Telephone Encounter (Signed)
Requested medications are due for refill today.  yes  Requested medications are on the active medications list.  yes  Last refill. 11/13/2021 #30 3 refills  Future visit scheduled.   yes  Notes to clinic.  Labs are expired.    Requested Prescriptions  Pending Prescriptions Disp Refills   buPROPion (WELLBUTRIN XL) 300 MG 24 hr tablet 30 tablet 3    Sig: Take 1 tablet (300 mg total) by mouth daily.     Psychiatry: Antidepressants - bupropion Failed - 03/17/2022 11:28 AM      Failed - Cr in normal range and within 360 days    Creatinine, Ser  Date Value Ref Range Status  08/14/2021 1.76 (H) 0.76 - 1.27 mg/dL Final         Failed - AST in normal range and within 360 days    AST  Date Value Ref Range Status  02/12/2021 18 0 - 40 IU/L Final         Failed - ALT in normal range and within 360 days    ALT  Date Value Ref Range Status  02/12/2021 27 0 - 44 IU/L Final         Failed - Valid encounter within last 6 months    Recent Outpatient Visits           7 months ago Primary hypertension   [facility] [place], Megan P, DO   1 year ago Routine general medical examination at a health care facility   [facility], [place] P, DO   1 year ago Panic disorder with agoraphobia   [facility] [place], Megan P, DO   1 year ago Suspected COVID-19 virus infection   [facility] [place], [place], DO   2 years ago Acute right eye pain   [facility] [place], [place], DO       Future Appointments             In 1 week Johnson, Megan P, DO [facility], [facility]            Passed - Last BP in normal range    BP Readings from Last 1 Encounters:  08/14/21 130/83

## 2022-03-30 ENCOUNTER — Ambulatory Visit: Payer: Self-pay | Admitting: Family Medicine

## 2022-04-13 ENCOUNTER — Ambulatory Visit: Payer: Self-pay | Admitting: Family Medicine

## 2022-04-14 ENCOUNTER — Encounter: Payer: Self-pay | Admitting: Family Medicine

## 2022-04-14 ENCOUNTER — Ambulatory Visit (INDEPENDENT_AMBULATORY_CARE_PROVIDER_SITE_OTHER): Payer: Self-pay | Admitting: Family Medicine

## 2022-04-14 DIAGNOSIS — I10 Essential (primary) hypertension: Secondary | ICD-10-CM

## 2022-04-14 DIAGNOSIS — F4001 Agoraphobia with panic disorder: Secondary | ICD-10-CM

## 2022-04-14 MED ORDER — VENLAFAXINE HCL ER 150 MG PO CP24
ORAL_CAPSULE | ORAL | 1 refills | Status: DC
Start: 2022-04-14 — End: 2022-10-18

## 2022-04-14 MED ORDER — BUPROPION HCL ER (XL) 300 MG PO TB24: 300.0000 mg | ORAL_TABLET | Freq: Every day | ORAL | 1 refills | Status: DC

## 2022-04-14 MED ORDER — METOPROLOL SUCCINATE ER 50 MG PO TB24: 50.0000 mg | ORAL_TABLET | Freq: Every day | ORAL | 1 refills | Status: DC

## 2022-04-14 MED ORDER — VENLAFAXINE HCL ER 75 MG PO CP24
ORAL_CAPSULE | ORAL | 1 refills | Status: DC
Start: 2022-04-14 — End: 2022-10-18

## 2022-04-14 NOTE — Assessment & Plan Note (Signed)
Under good control on current regimen. Continue current regimen. Continue to monitor. Call with any concerns. Refills given.   

## 2022-04-14 NOTE — Progress Notes (Signed)
BP 114/76   Pulse 61   Temp 98.7 F (37.1 C) (Oral)   Ht 5' 9.6" (1.768 m)   Wt 269 lb 6.4 oz (122.2 kg)   SpO2 98%   BMI 39.10 kg/m    Subjective:    Patient ID: Brady Edwards, male    DOB: 01-22-1966, 56 y.o.   MRN: [id number]  HPI: Brady Edwards is a 57 y.o. male  Chief Complaint  Patient presents with   Depression   Anxiety   ANXIETY/DEPRESION Duration: chronic Status:stable Anxious mood: no  Excessive worrying: no Irritability: no  Sweating: no Nausea: no Palpitations:no Hyperventilation: no Panic attacks: no Agoraphobia: no  Obscessions/compulsions: no Depressed mood: no    04/14/2022    9:41 AM 08/14/2021    4:26 PM 02/12/2021    3:38 PM 01/12/2021    4:20 PM 09/30/2020    3:12 PM  Depression screen PHQ 2/9  Decreased Interest '2 3 2 3 ' 0  Down, Depressed, Hopeless '2 3 2 1 ' 0  PHQ - 2 Score '4 6 4 4 ' 0  Altered sleeping 0 '2 3 3 ' 0  Tired, decreased energy 1 0 3 3 0  Change in appetite 1 0 2 3 0  Feeling bad or failure about yourself  '1 3 2 2 ' 0  Trouble concentrating 1 2 0 2 0  Moving slowly or fidgety/restless 0 0 0 1 0  Suicidal thoughts 0 0 0 0 0  PHQ-9 Score '8 13 14 18 ' 0  Difficult doing work/chores Somewhat difficult  Somewhat difficult Extremely dIfficult Not difficult at all   Anhedonia: no Weight changes: no Insomnia: no   Hypersomnia: no Fatigue/loss of energy: no Feelings of worthlessness: no Feelings of guilt: no Impaired concentration/indecisiveness: no Suicidal ideations: no  Crying spells: no Recent Stressors/Life Changes: no   Relationship problems: no   Family stress: no     Financial stress: no    Job stress: no    Recent death/loss: no  HYPERTENSION Hypertension status: controlled  Satisfied with current treatment? yes Duration of hypertension: chronic BP monitoring frequency:  not checking BP medication side effects:  no Medication compliance: excellent compliance Previous BP meds: metoprolol Aspirin:  no Recurrent headaches: no Visual changes: no Palpitations: no Dyspnea: no Chest pain: no Lower extremity edema: no Dizzy/lightheaded: no   Relevant past medical, surgical, family and social history reviewed and updated as indicated. Interim medical history since our last visit reviewed. Allergies and medications reviewed and updated.  Review of Systems  Constitutional: Negative.   Respiratory: Negative.    Cardiovascular: Negative.   Gastrointestinal: Negative.   Musculoskeletal: Negative.   Neurological: Negative.   Psychiatric/Behavioral: Negative.      Per HPI unless specifically indicated above     Objective:    BP 114/76   Pulse 61   Temp 98.7 F (37.1 C) (Oral)   Ht 5' 9.6" (1.768 m)   Wt 269 lb 6.4 oz (122.2 kg)   SpO2 98%   BMI 39.10 kg/m   Wt Readings from Last 3 Encounters:  04/14/22 269 lb 6.4 oz (122.2 kg)  08/14/21 286 lb 9.6 oz (130 kg)  02/12/21 295 lb 3.2 oz (133.9 kg)    Physical Exam Vitals and nursing note reviewed.  Constitutional:      General: He is not in acute distress.    Appearance: Normal appearance. He is obese. He is not ill-appearing, toxic-appearing or diaphoretic.  HENT:     Head: Normocephalic and atraumatic.  Right Ear: External ear normal.     Left Ear: External ear normal.     Nose: Nose normal.     Mouth/Throat:     Mouth: Mucous membranes are moist.     Pharynx: Oropharynx is clear.  Eyes:     General: No scleral icterus.       Right eye: No discharge.        Left eye: No discharge.     Extraocular Movements: Extraocular movements intact.     Conjunctiva/sclera: Conjunctivae normal.     Pupils: Pupils are equal, round, and reactive to light.  Cardiovascular:     Rate and Rhythm: Normal rate and regular rhythm.     Pulses: Normal pulses.     Heart sounds: Normal heart sounds. No murmur heard.    No friction rub. No gallop.  Pulmonary:     Effort: Pulmonary effort is normal. No respiratory distress.      Breath sounds: Normal breath sounds. No stridor. No wheezing, rhonchi or rales.  Chest:     Chest wall: No tenderness.  Musculoskeletal:        General: Normal range of motion.     Cervical back: Normal range of motion and neck supple.  Skin:    General: Skin is warm and dry.     Capillary Refill: Capillary refill takes less than 2 seconds.     Coloration: Skin is not jaundiced or pale.     Findings: No bruising, erythema, lesion or rash.  Neurological:     General: No focal deficit present.     Mental Status: He is alert and oriented to person, place, and time. Mental status is at baseline.  Psychiatric:        Mood and Affect: Mood normal.        Behavior: Behavior normal.        Thought Content: Thought content normal.        Judgment: Judgment normal.     Results for orders placed or performed in visit on 61/95/09  Basic metabolic panel  Result Value Ref Range   Glucose 99 70 - 99 mg/dL   BUN 14 6 - 24 mg/dL   Creatinine, Ser 1.76 (H) 0.76 - 1.27 mg/dL   eGFR 45 (L) >59 mL/min/1.73   BUN/Creatinine Ratio 8 (L) 9 - 20   Sodium 140 134 - 144 mmol/L   Potassium 4.5 3.5 - 5.2 mmol/L   Chloride 105 96 - 106 mmol/L   CO2 18 (L) 20 - 29 mmol/L   Calcium 9.2 8.7 - 10.2 mg/dL      Assessment & Plan:   Problem List Items Addressed This Visit       Cardiovascular and Mediastinum   Hypertension    Under good control on current regimen. Continue current regimen. Continue to monitor. Call with any concerns. Refills given.        Relevant Medications   metoprolol succinate (TOPROL-XL) 50 MG 24 hr tablet     Other   Panic disorder with agoraphobia    Under good control on current regimen. Continue current regimen. Continue to monitor. Call with any concerns. Refills given.        Relevant Medications   buPROPion (WELLBUTRIN XL) 300 MG 24 hr tablet   venlafaxine XR (EFFEXOR-XR) 150 MG 24 hr capsule   venlafaxine XR (EFFEXOR-XR) 75 MG 24 hr capsule     Follow up  plan: Return in about 6 months (around 10/15/2022) for physical if insurance  active.

## 2022-09-22 ENCOUNTER — Encounter: Payer: Self-pay | Admitting: Family Medicine

## 2022-10-16 ENCOUNTER — Other Ambulatory Visit: Payer: Self-pay | Admitting: Family Medicine

## 2022-10-18 NOTE — Telephone Encounter (Signed)
Requested Prescriptions  Pending Prescriptions Disp Refills   metoprolol succinate (TOPROL-XL) 50 MG 24 hr tablet [Pharmacy Med Name: METOPROLOL SUCC ER 50 MG TAB] 90 tablet 0    Sig: Take 1 tablet (50 mg total) by mouth daily.     Cardiovascular:  Beta Blockers Failed - 10/16/2022 11:43 AM      Failed - Valid encounter within last 6 months    Recent Outpatient Visits           6 months ago Primary hypertension   [place], [place], DO   1 year ago Primary hypertension   [place], [place], DO   1 year ago Routine general medical examination at a health care facility   [place], [place], DO   1 year ago Panic disorder with agoraphobia   [place], [place], DO   2 years ago Suspected COVID-19 virus infection   [facility] [place], [place], DO       Future Appointments             In 1 week Wynetta Emery, Barb Merino, DO [place]            Passed - Last BP in normal range    BP Readings from Last 1 Encounters:  04/14/22 114/76         Passed - Last Heart Rate in normal range    Pulse Readings from Last 1 Encounters:  04/14/22 61

## 2022-10-18 NOTE — Telephone Encounter (Signed)
Requested medications are due for refill today.  Yes  Requested medications are on the active medications list.  yes  Last refill. All 3 04/14/2022 #90 1 rf  Future visit scheduled.   yes  Notes to clinic.  Labs are expired.    Requested Prescriptions  Pending Prescriptions Disp Refills   buPROPion (WELLBUTRIN XL) 300 MG 24 hr tablet [Pharmacy Med Name: BUPROPION HCL XL 300 MG TABLET] 30 tablet 0    Sig: Take 1 tablet (300 mg total) by mouth daily.     Psychiatry: Antidepressants - bupropion Failed - 10/16/2022 11:43 AM      Failed - Cr in normal range and within 360 days    Creatinine, Ser  Date Value Ref Range Status  08/14/2021 1.76 (H) 0.76 - 1.27 mg/dL Final         Failed - AST in normal range and within 360 days    AST  Date Value Ref Range Status  02/12/2021 18 0 - 40 IU/L Final         Failed - ALT in normal range and within 360 days    ALT  Date Value Ref Range Status  02/12/2021 27 0 - 44 IU/L Final         Failed - Valid encounter within last 6 months    Recent Outpatient Visits           6 months ago Primary hypertension   [place], [place] P, DO   1 year ago Primary hypertension   [place], [place], DO   1 year ago Routine general medical examination at a health care facility   [place], [place], DO   1 year ago Panic disorder with agoraphobia   [place] [facility] [place], Megan P, DO   2 years ago Suspected COVID-19 virus infection   [place] [facility] [place], [place], DO       Future Appointments             In 1 week Wynetta Emery, Megan P, DO [place], [facility]            Passed - Last BP in normal range    BP Readings from Last 1 Encounters:  04/14/22 114/76          venlafaxine XR (EFFEXOR-XR) 150 MG 24 hr capsule [Pharmacy Med Name: VENLAFAXINE HCL ER 150 MG CAP] 30  capsule 0    Sig: TAKE 1 CAPSULE BY MOUTH ONCE DAILY WITH BREAKFAST (TO BE TAKEN WITH THE 75 MG FOR A TOTAL OF 225 MG DAILY)     Psychiatry: Antidepressants - SNRI - desvenlafaxine & venlafaxine Failed - 10/16/2022 11:43 AM      Failed - Cr in normal range and within 360 days    Creatinine, Ser  Date Value Ref Range Status  08/14/2021 1.76 (H) 0.76 - 1.27 mg/dL Final         Failed - Valid encounter within last 6 months    Recent Outpatient Visits           6 months ago Primary hypertension   [place], [place], DO   1 year ago Primary hypertension   [place], Sarah [place], DO   1 year ago Routine general medical examination at a health care facility   [place],  Megan P, DO   1 year ago Panic disorder with agoraphobia   [place] [facility] [place], [place], DO   2 years ago Suspected COVID-19 virus infection   [place], [place], DO       Future Appointments             In 1 week Wynetta Emery, Barb Merino, DO [place], [facility]            Failed - Lipid Panel in normal range within the last 12 months    Cholesterol, Total  Date Value Ref Range Status  02/12/2021 220 (H) 100 - 199 mg/dL Final   LDL Chol Calc (NIH)  Date Value Ref Range Status  02/12/2021 108 (H) 0 - 99 mg/dL Final   HDL  Date Value Ref Range Status  02/12/2021 31 (L) >39 mg/dL Final   Triglycerides  Date Value Ref Range Status  02/12/2021 469 (H) 0 - 149 mg/dL Final         Passed - Last BP in normal range    BP Readings from Last 1 Encounters:  04/14/22 114/76          venlafaxine XR (EFFEXOR-XR) 75 MG 24 hr capsule [Pharmacy Med Name: VENLAFAXINE HCL ER 75 MG CAP] 30 capsule 0    Sig: TAKE 1 CAPSULE BY MOUTH ONCE DAILY WITH BREAKFAST (TO BE TAKEN WITH THE 150 MG FOR A TOTAL OF 225 MG DAILY)     Psychiatry:  Antidepressants - SNRI - desvenlafaxine & venlafaxine Failed - 10/16/2022 11:43 AM      Failed - Cr in normal range and within 360 days    Creatinine, Ser  Date Value Ref Range Status  08/14/2021 1.76 (H) 0.76 - 1.27 mg/dL Final         Failed - Valid encounter within last 6 months    Recent Outpatient Visits           6 months ago Primary hypertension   [place], [place], DO   1 year ago Primary hypertension   [place], [place], DO   1 year ago Routine general medical examination at a health care facility   [place], [place] P, DO   1 year ago Panic disorder with agoraphobia   [place] [facility] [place], Megan P, DO   2 years ago Suspected COVID-19 virus infection   [place] [facility] [place], [place], DO       Future Appointments             In 1 week Wynetta Emery, Megan P, DO [place], [facility]            Failed - Lipid Panel in normal range within the last 12 months    Cholesterol, Total  Date Value Ref Range Status  02/12/2021 220 (H) 100 - 199 mg/dL Final   LDL Chol Calc (NIH)  Date Value Ref Range Status  02/12/2021 108 (H) 0 - 99 mg/dL Final   HDL  Date Value Ref Range Status  02/12/2021 31 (L) >39 mg/dL Final   Triglycerides  Date Value Ref Range Status  02/12/2021 469 (H) 0 - 149 mg/dL Final         Passed - Last BP in normal range    BP Readings from Last 1 Encounters:  04/14/22 114/76

## 2022-10-19 ENCOUNTER — Encounter: Payer: Self-pay | Admitting: Family Medicine

## 2022-10-27 ENCOUNTER — Encounter: Payer: Self-pay | Admitting: Family Medicine

## 2022-11-17 ENCOUNTER — Other Ambulatory Visit: Payer: Self-pay | Admitting: Nurse Practitioner

## 2022-11-18 NOTE — Telephone Encounter (Signed)
Requested medication (s) are due for refill today: yes  Requested medication (s) are on the active medication list: yes  Last refill:  10/18/22  Future visit scheduled: yes, 11/23/22  Notes to clinic:  Unable to refill per protocol, courtesy refill already given, routing for provider approval.      Requested Prescriptions  Pending Prescriptions Disp Refills   venlafaxine XR (EFFEXOR-XR) 75 MG 24 hr capsule [Pharmacy Med Name: VENLAFAXINE HCL ER 75 MG CAP] 30 capsule 0    Sig: TAKE 1 CAPSULE BY MOUTH ONCE DAILY WITH BREAKFAST (TO BE TAKEN WITH THE 150 MG FOR A TOTAL OF 225 MG DAILY)     Psychiatry: Antidepressants - SNRI - desvenlafaxine & venlafaxine Failed - 11/17/2022  9:10 AM      Failed - Cr in normal range and within 360 days    Creatinine, Ser  Date Value Ref Range Status  08/14/2021 1.76 (H) 0.76 - 1.27 mg/dL Final         Failed - Valid encounter within last 6 months    Recent Outpatient Visits           7 months ago Primary hypertension   [place], [place], DO   1 year ago Primary hypertension   [place], [place], DO   1 year ago Routine general medical examination at a health care facility   [place], [place] P, DO   1 year ago Panic disorder with agoraphobia   [place] [facility] [place], Megan P, DO   2 years ago Suspected COVID-19 virus infection   [place] [facility] [place], [place], DO       Future Appointments             In 5 days Wynetta Emery, Barb Merino, DO [place], [facility]            Failed - Lipid Panel in normal range within the last 12 months    Cholesterol, Total  Date Value Ref Range Status  02/12/2021 220 (H) 100 - 199 mg/dL Final   LDL Chol Calc (NIH)  Date Value Ref Range Status  02/12/2021 108 (H) 0 - 99 mg/dL Final   HDL  Date Value Ref Range Status  02/12/2021 31 (L) >39  mg/dL Final   Triglycerides  Date Value Ref Range Status  02/12/2021 469 (H) 0 - 149 mg/dL Final         Passed - Last BP in normal range    BP Readings from Last 1 Encounters:  04/14/22 114/76          venlafaxine XR (EFFEXOR-XR) 150 MG 24 hr capsule [Pharmacy Med Name: VENLAFAXINE HCL ER 150 MG CAP] 30 capsule 0    Sig: TAKE 1 CAPSULE BY MOUTH ONCE DAILY WITH BREAKFAST (TO BE TAKEN WITH THE 75 MG FOR A TOTAL OF 225 MG DAILY)     Psychiatry: Antidepressants - SNRI - desvenlafaxine & venlafaxine Failed - 11/17/2022  9:10 AM      Failed - Cr in normal range and within 360 days    Creatinine, Ser  Date Value Ref Range Status  08/14/2021 1.76 (H) 0.76 - 1.27 mg/dL Final         Failed - Valid encounter within last 6 months    Recent Outpatient Visits           7 months ago Primary hypertension  [place], [place], DO   1 year ago Primary hypertension   [place], [place], DO   1 year ago Routine general medical examination at a health care facility   [place], [place], DO   1 year ago Panic disorder with agoraphobia   [place] [facility] [place], [place], DO   2 years ago Suspected COVID-19 virus infection   [place] [facility] [place], [place], DO       Future Appointments             In 5 days Wynetta Emery, Barb Merino, DO [place], [facility]            Failed - Lipid Panel in normal range within the last 12 months    Cholesterol, Total  Date Value Ref Range Status  02/12/2021 220 (H) 100 - 199 mg/dL Final   LDL Chol Calc (NIH)  Date Value Ref Range Status  02/12/2021 108 (H) 0 - 99 mg/dL Final   HDL  Date Value Ref Range Status  02/12/2021 31 (L) >39 mg/dL Final   Triglycerides  Date Value Ref Range Status  02/12/2021 469 (H) 0 - 149 mg/dL Final         Passed - Last BP in normal range    BP  Readings from Last 1 Encounters:  04/14/22 114/76          buPROPion (WELLBUTRIN XL) 300 MG 24 hr tablet [Pharmacy Med Name: BUPROPION HCL XL 300 MG TABLET] 30 tablet 0    Sig: Take 1 tablet (300 mg total) by mouth daily.     Psychiatry: Antidepressants - bupropion Failed - 11/17/2022  9:10 AM      Failed - Cr in normal range and within 360 days    Creatinine, Ser  Date Value Ref Range Status  08/14/2021 1.76 (H) 0.76 - 1.27 mg/dL Final         Failed - AST in normal range and within 360 days    AST  Date Value Ref Range Status  02/12/2021 18 0 - 40 IU/L Final         Failed - ALT in normal range and within 360 days    ALT  Date Value Ref Range Status  02/12/2021 27 0 - 44 IU/L Final         Failed - Valid encounter within last 6 months    Recent Outpatient Visits           7 months ago Primary hypertension   [place], [place], DO   1 year ago Primary hypertension   [place], [place], DO   1 year ago Routine general medical examination at a health care facility   [place], [place], DO   1 year ago Panic disorder with agoraphobia   [place], Megan P, DO   2 years ago Suspected COVID-19 virus infection   [place], [place], DO       Future Appointments             In 5 days Wynetta Emery, Barb Merino, DO [place], [facility]            Passed - Last BP in normal range    BP  Readings from Last 1 Encounters:  04/14/22 114/76

## 2022-11-23 ENCOUNTER — Encounter: Payer: Self-pay | Admitting: Family Medicine

## 2022-11-23 ENCOUNTER — Ambulatory Visit (INDEPENDENT_AMBULATORY_CARE_PROVIDER_SITE_OTHER): Payer: Self-pay | Admitting: Family Medicine

## 2022-11-23 VITALS — BP 128/82 | HR 56 | Temp 98.3°F | Ht 70.25 in | Wt 274.7 lb

## 2022-11-23 DIAGNOSIS — I10 Essential (primary) hypertension: Secondary | ICD-10-CM | POA: Diagnosis not present

## 2022-11-23 DIAGNOSIS — E782 Mixed hyperlipidemia: Secondary | ICD-10-CM | POA: Diagnosis not present

## 2022-11-23 DIAGNOSIS — F4001 Agoraphobia with panic disorder: Secondary | ICD-10-CM

## 2022-11-23 DIAGNOSIS — R3914 Feeling of incomplete bladder emptying: Secondary | ICD-10-CM

## 2022-11-23 DIAGNOSIS — A630 Anogenital (venereal) warts: Secondary | ICD-10-CM

## 2022-11-23 DIAGNOSIS — N401 Enlarged prostate with lower urinary tract symptoms: Secondary | ICD-10-CM

## 2022-11-23 LAB — MICROALBUMIN, URINE WAIVED
Creatinine, Urine Waived: 50 mg/dL (ref 10–300)
Microalb, Ur Waived: 150 mg/L — ABNORMAL HIGH (ref 0–19)
Microalb/Creat Ratio: >300 mg/g — ABNORMAL HIGH (ref <30)

## 2022-11-23 MED ORDER — VENLAFAXINE HCL ER 150 MG PO CP24: 150.0000 mg | ORAL_CAPSULE | Freq: Every day | ORAL | 1 refills | Status: DC

## 2022-11-23 MED ORDER — VENLAFAXINE HCL ER 75 MG PO CP24: ORAL_CAPSULE | ORAL | 1 refills | Status: DC

## 2022-11-23 MED ORDER — METOPROLOL SUCCINATE ER 50 MG PO TB24: 50.0000 mg | ORAL_TABLET | Freq: Every day | ORAL | 1 refills | Status: DC

## 2022-11-23 MED ORDER — BUPROPION HCL ER (XL) 300 MG PO TB24: 300.0000 mg | ORAL_TABLET | Freq: Every day | ORAL | 1 refills | Status: DC

## 2022-11-23 MED ORDER — TADALAFIL 20 MG PO TABS: 10.0000 mg | ORAL_TABLET | ORAL | 11 refills | Status: DC | PRN

## 2022-11-23 NOTE — Assessment & Plan Note (Signed)
Under good control on current regimen. Continue current regimen. Continue to monitor. Call with any concerns. Refills given. Labs drawn today.   

## 2022-11-23 NOTE — Progress Notes (Signed)
BP 128/82   Pulse (!) 56   Temp 98.3 F (36.8 C) (Oral)   Ht 5' 10.25" (1.784 m)   Wt 274 lb 11.2 oz (124.6 kg)   SpO2 97%   BMI 39.14 kg/m    Subjective:    Patient ID: Brady Edwards, male    DOB: 1966-03-23, 57 y.o.   MRN: [id number]  HPI: Brady Edwards is a 57 y.o. male  Chief Complaint  Patient presents with   Hypertension   Hyperlipidemia   Depression   HYPERTENSION / [place] Satisfied with current treatment? yes Duration of hypertension: chronic BP monitoring frequency: not checking BP medication side effects: no Past BP meds: metoprolol Duration of hyperlipidemia: chronic Cholesterol medication side effects: no Cholesterol supplements: none Past cholesterol medications: none Medication compliance: excellent compliance Aspirin: no Recent stressors: no Recurrent headaches: no Visual changes: no Palpitations: no Dyspnea: no Chest pain: no Lower extremity edema: no Dizzy/lightheaded: no  DEPRESSION Mood status: stable Satisfied with current treatment?: yes Symptom severity: moderate  Duration of current treatment : chronic Side effects: no Medication compliance: excellent compliance Psychotherapy/counseling: yes- in the past  Previous psychiatric medications: effexor, wellbutrin Depressed mood: yes Anxious mood: yes Anhedonia: no Significant weight loss or gain: no Insomnia: no  Fatigue: yes Feelings of worthlessness or guilt: no Impaired concentration/indecisiveness: no Suicidal ideations: no Hopelessness: no Crying spells: no    04/14/2022    9:41 AM 08/14/2021    4:26 PM 02/12/2021    3:38 PM 01/12/2021    4:20 PM 09/30/2020    3:12 PM  Depression screen PHQ 2/9  Decreased Interest 2 3 2 3  0  Down, Depressed, Hopeless 2 3 2 1  0  PHQ - 2 Score 4 6 4 4  0  Altered sleeping 0 2 3 3  0  Tired, decreased energy 1 0 3 3 0  Change in appetite 1 0 2 3 0  Feeling bad or failure about yourself  1 3 2 2  0  Trouble concentrating 1 2 0 2  0  Moving slowly or fidgety/restless 0 0 0 1 0  Suicidal thoughts 0 0 0 0 0  PHQ-9 Score 8 13 14 18  0  Difficult doing work/chores Somewhat difficult  Somewhat difficult Extremely dIfficult Not difficult at all    Relevant past medical, surgical, family and social history reviewed and updated as indicated. Interim medical history since our last visit reviewed. Allergies and medications reviewed and updated.  Review of Systems  Constitutional: Negative.   Respiratory: Negative.    Cardiovascular: Negative.   Musculoskeletal: Negative.   Psychiatric/Behavioral:  Positive for dysphoric mood. Negative for agitation, behavioral problems, confusion, decreased concentration, hallucinations, self-injury, sleep disturbance and suicidal ideas. The patient is nervous/anxious. The patient is not hyperactive.     Per HPI unless specifically indicated above     Objective:    BP 128/82   Pulse (!) 56   Temp 98.3 F (36.8 C) (Oral)   Ht 5' 10.25" (1.784 m)   Wt 274 lb 11.2 oz (124.6 kg)   SpO2 97%   BMI 39.14 kg/m   Wt Readings from Last 3 Encounters:  11/23/22 274 lb 11.2 oz (124.6 kg)  04/14/22 269 lb 6.4 oz (122.2 kg)  08/14/21 286 lb 9.6 oz (130 kg)    Physical Exam Vitals and nursing note reviewed.  Constitutional:      General: He is not in acute distress.    Appearance: Normal appearance. He is not ill-appearing, toxic-appearing or diaphoretic.  HENT:     Head: Normocephalic and atraumatic.     Right Ear: External ear normal.     Left Ear: External ear normal.     Nose: Nose normal.     Mouth/Throat:     Mouth: Mucous membranes are moist.     Pharynx: Oropharynx is clear.  Eyes:     General: No scleral icterus.       Right eye: No discharge.        Left eye: No discharge.     Extraocular Movements: Extraocular movements intact.     Conjunctiva/sclera: Conjunctivae normal.     Pupils: Pupils are equal, round, and reactive to light.  Cardiovascular:     Rate and Rhythm:  Normal rate and regular rhythm.     Pulses: Normal pulses.     Heart sounds: Normal heart sounds. No murmur heard.    No friction rub. No gallop.  Pulmonary:     Effort: Pulmonary effort is normal. No respiratory distress.     Breath sounds: Normal breath sounds. No stridor. No wheezing, rhonchi or rales.  Chest:     Chest wall: No tenderness.  Musculoskeletal:        General: Normal range of motion.     Cervical back: Normal range of motion and neck supple.  Skin:    General: Skin is warm and dry.     Capillary Refill: Capillary refill takes less than 2 seconds.     Coloration: Skin is not jaundiced or pale.     Findings: No bruising, erythema, lesion or rash.  Neurological:     General: No focal deficit present.     Mental Status: He is alert and oriented to person, place, and time. Mental status is at baseline.  Psychiatric:        Mood and Affect: Mood normal.        Behavior: Behavior normal.        Thought Content: Thought content normal.        Judgment: Judgment normal.     Results for orders placed or performed in visit on 11/23/22  Microalbumin, Urine Waived  Result Value Ref Range   Microalb, Ur Waived 150 (H) 0 - 19 mg/L   Creatinine, Urine Waived 50 10 - 300 mg/dL   Microalb/Creat Ratio >300 (H) <30 mg/g      Assessment & Plan:   Problem List Items Addressed This Visit       Cardiovascular and Mediastinum   Hypertension - Primary    Under good control on current regimen. Continue current regimen. Continue to monitor. Call with any concerns. Refills given. Labs drawn today.        Relevant Medications   tadalafil (CIALIS) 20 MG tablet   metoprolol succinate (TOPROL-XL) 50 MG 24 hr tablet   Other Relevant Orders   Comprehensive metabolic panel   Microalbumin, Urine Waived (Completed)     Genitourinary   BPH (benign prostatic hyperplasia)    Under good control on current regimen. Continue current regimen. Continue to monitor. Call with any concerns.  Refills given. Labs drawn today.       Relevant Orders   Comprehensive metabolic panel   PSA     Other   Panic disorder with agoraphobia    Under good control on current regimen. Continue current regimen. Continue to monitor. Call with any concerns. Refills given. Labs drawn today.       Relevant Medications   buPROPion (WELLBUTRIN XL) 300 MG 24  hr tablet   venlafaxine XR (EFFEXOR-XR) 150 MG 24 hr capsule   venlafaxine XR (EFFEXOR-XR) 75 MG 24 hr capsule   Hyperlipidemia    Under good control on current regimen. Continue current regimen. Continue to monitor. Call with any concerns. Refills given. Labs drawn today.       Relevant Medications   tadalafil (CIALIS) 20 MG tablet   metoprolol succinate (TOPROL-XL) 50 MG 24 hr tablet   Other Relevant Orders   Comprehensive metabolic panel   Lipid Panel w/o Chol/HDL Ratio   Other Visit Diagnoses     Condylomata acuminata in male       Discussed pathophysiology. Would like to see derm when he has insurance again.        Follow up plan: Return in about 6 months (around 05/26/2023).

## 2022-11-24 LAB — COMPREHENSIVE METABOLIC PANEL
ALT: 28 IU/L (ref 0–44)
AST: 22 IU/L (ref 0–40)
Albumin/Globulin Ratio: 1.5 (ref 1.2–2.2)
Albumin: 4 g/dL (ref 3.8–4.9)
Alkaline Phosphatase: 122 IU/L — ABNORMAL HIGH (ref 44–121)
BUN/Creatinine Ratio: 18 (ref 9–20)
BUN: 33 mg/dL — ABNORMAL HIGH (ref 6–24)
Bilirubin Total: 0.3 mg/dL (ref 0.0–1.2)
CO2: 18 mmol/L — ABNORMAL LOW (ref 20–29)
Calcium: 8.9 mg/dL (ref 8.7–10.2)
Chloride: 106 mmol/L (ref 96–106)
Creatinine, Ser: 1.84 mg/dL — ABNORMAL HIGH (ref 0.76–1.27)
Globulin, Total: 2.7 g/dL (ref 1.5–4.5)
Glucose: 110 mg/dL — ABNORMAL HIGH (ref 70–99)
Potassium: 4.7 mmol/L (ref 3.5–5.2)
Sodium: 141 mmol/L (ref 134–144)
Total Protein: 6.7 g/dL (ref 6.0–8.5)
eGFR: 42 mL/min/{1.73_m2} — ABNORMAL LOW (ref >59)

## 2022-11-24 LAB — LIPID PANEL W/O CHOL/HDL RATIO
Cholesterol, Total: 234 mg/dL — ABNORMAL HIGH (ref 100–199)
HDL: 34 mg/dL — ABNORMAL LOW (ref >39)
LDL Chol Calc (NIH): 135 mg/dL — ABNORMAL HIGH (ref 0–99)
Triglycerides: 357 mg/dL — ABNORMAL HIGH (ref 0–149)
VLDL Cholesterol Cal: 65 mg/dL — ABNORMAL HIGH (ref 5–40)

## 2022-11-24 LAB — PSA: Prostate Specific Ag, Serum: 1 ng/mL (ref 0.0–4.0)

## 2022-12-27 ENCOUNTER — Ambulatory Visit (INDEPENDENT_AMBULATORY_CARE_PROVIDER_SITE_OTHER): Payer: Self-pay | Admitting: Family Medicine

## 2022-12-27 ENCOUNTER — Encounter: Payer: Self-pay | Admitting: Family Medicine

## 2022-12-27 VITALS — BP 136/88 | HR 61 | Temp 98.2°F | Ht 70.25 in | Wt 282.3 lb

## 2022-12-27 DIAGNOSIS — M546 Pain in thoracic spine: Secondary | ICD-10-CM

## 2022-12-27 MED ORDER — BACLOFEN 10 MG PO TABS: 10.0000 mg | ORAL_TABLET | Freq: Every evening | ORAL | 0 refills | Status: DC | PRN

## 2022-12-27 MED ORDER — PREDNISONE 10 MG PO TABS: ORAL_TABLET | ORAL | 0 refills | Status: DC

## 2022-12-27 MED ORDER — TRIAMCINOLONE ACETONIDE 40 MG/ML IJ SUSP
40.0000 mg | Freq: Once | INTRAMUSCULAR | Status: AC
  Administered 2022-12-27: 40 mg via INTRAMUSCULAR

## 2022-12-27 NOTE — Progress Notes (Signed)
BP 136/88 (BP Location: Left Arm, Cuff Size: Normal)   Pulse 61   Temp 98.2 F (36.8 C) (Oral)   Ht 5' 10.25" (1.784 m)   Wt 282 lb 4.8 oz (128.1 kg)   SpO2 96%   BMI 40.22 kg/m    Subjective:    Patient ID: Brady Edwards, male    DOB: 1966-06-20, 57 y.o.   MRN: [id number]  HPI: Brady Edwards is a 57 y.o. male  Chief Complaint  Patient presents with   Back Problem    Patient says on his L mid back gets a twinge and says he causes his knees to buckle when he has the pain. Patient says the pain/discomfort comes with certain movement and it can be every day and all day. Patient says he took Lidocaine patch and muscle relaxant and says that neither of those helped it. Patient says his job he is always bending and lifting and did not noticed pain until yesterday when he went to shower.   BACK PAIN Duration: 2 days Mechanism of injury: unknown Location: Left and low back Onset: sudden Severity: severe Quality: shooting, buckling Frequency: intermittent Radiation: none Aggravating factors: movement Alleviating factors: rest Status: stable Treatments attempted: rest, ice, heat, APAP, and ibuprofen  Relief with NSAIDs?: no Nighttime pain:  no Paresthesias / decreased sensation:  no Bowel / bladder incontinence:  no Fevers:  no Dysuria / urinary frequency:  no   Relevant past medical, surgical, family and social history reviewed and updated as indicated. Interim medical history since our last visit reviewed. Allergies and medications reviewed and updated.  Review of Systems  Constitutional: Negative.   Respiratory: Negative.    Cardiovascular: Negative.   Gastrointestinal: Negative.   Musculoskeletal:  Positive for back pain and myalgias. Negative for arthralgias, gait problem, joint swelling, neck pain and neck stiffness.  Skin: Negative.   Neurological: Negative.   Psychiatric/Behavioral: Negative.      Per HPI unless specifically indicated above      Objective:    BP 136/88 (BP Location: Left Arm, Cuff Size: Normal)   Pulse 61   Temp 98.2 F (36.8 C) (Oral)   Ht 5' 10.25" (1.784 m)   Wt 282 lb 4.8 oz (128.1 kg)   SpO2 96%   BMI 40.22 kg/m   Wt Readings from Last 3 Encounters:  12/27/22 282 lb 4.8 oz (128.1 kg)  11/23/22 274 lb 11.2 oz (124.6 kg)  04/14/22 269 lb 6.4 oz (122.2 kg)    Physical Exam Vitals and nursing note reviewed.  Constitutional:      General: He is not in acute distress.    Appearance: Normal appearance. He is not ill-appearing, toxic-appearing or diaphoretic.  HENT:     Head: Normocephalic and atraumatic.     Right Ear: External ear normal.     Left Ear: External ear normal.     Nose: Nose normal.     Mouth/Throat:     Mouth: Mucous membranes are moist.     Pharynx: Oropharynx is clear.  Eyes:     General: No scleral icterus.       Right eye: No discharge.        Left eye: No discharge.     Extraocular Movements: Extraocular movements intact.     Conjunctiva/sclera: Conjunctivae normal.     Pupils: Pupils are equal, round, and reactive to light.  Cardiovascular:     Rate and Rhythm: Normal rate and regular rhythm.     Pulses:  Normal pulses.     Heart sounds: Normal heart sounds. No murmur heard.    No friction rub. No gallop.  Pulmonary:     Effort: Pulmonary effort is normal. No respiratory distress.     Breath sounds: Normal breath sounds. No stridor. No wheezing, rhonchi or rales.  Chest:     Chest wall: No tenderness.  Musculoskeletal:        General: Normal range of motion.     Cervical back: Normal range of motion and neck supple.  Skin:    General: Skin is warm and dry.     Capillary Refill: Capillary refill takes less than 2 seconds.     Coloration: Skin is not jaundiced or pale.     Findings: No bruising, erythema, lesion or rash.  Neurological:     General: No focal deficit present.     Mental Status: He is alert and oriented to person, place, and time. Mental status is at  baseline.  Psychiatric:        Mood and Affect: Mood normal.        Behavior: Behavior normal.        Thought Content: Thought content normal.        Judgment: Judgment normal.     Results for orders placed or performed in visit on 11/23/22  Comprehensive metabolic panel  Result Value Ref Range   Glucose 110 (H) 70 - 99 mg/dL   BUN 33 (H) 6 - 24 mg/dL   Creatinine, Ser 1.61 (H) 0.76 - 1.27 mg/dL   eGFR 42 (L) >09 UE/AVW/0.98   BUN/Creatinine Ratio 18 9 - 20   Sodium 141 134 - 144 mmol/L   Potassium 4.7 3.5 - 5.2 mmol/L   Chloride 106 96 - 106 mmol/L   CO2 18 (L) 20 - 29 mmol/L   Calcium 8.9 8.7 - 10.2 mg/dL   Total Protein 6.7 6.0 - 8.5 g/dL   Albumin 4.0 3.8 - 4.9 g/dL   Globulin, Total 2.7 1.5 - 4.5 g/dL   Albumin/Globulin Ratio 1.5 1.2 - 2.2   Bilirubin Total 0.3 0.0 - 1.2 mg/dL   Alkaline Phosphatase 122 (H) 44 - 121 IU/L   AST 22 0 - 40 IU/L   ALT 28 0 - 44 IU/L  Lipid Panel w/o Chol/HDL Ratio  Result Value Ref Range   Cholesterol, Total 234 (H) 100 - 199 mg/dL   Triglycerides 119 (H) 0 - 149 mg/dL   HDL 34 (L) >14 mg/dL   VLDL Cholesterol Cal 65 (H) 5 - 40 mg/dL   LDL Chol Calc (NIH) 782 (H) 0 - 99 mg/dL  Microalbumin, Urine Waived  Result Value Ref Range   Microalb, Ur Waived 150 (H) 0 - 19 mg/L   Creatinine, Urine Waived 50 10 - 300 mg/dL   Microalb/Creat Ratio >300 (H) <30 mg/g  PSA  Result Value Ref Range   Prostate Specific Ag, Serum 1.0 0.0 - 4.0 ng/mL      Assessment & Plan:   Problem List Items Addressed This Visit   None Visit Diagnoses     Acute left-sided thoracic back pain    -  Primary   Will treat with prednisone, baclofen and stretches. Call with any concerns or if not getting better. Continue to monitor.   Relevant Medications   triamcinolone acetonide (KENALOG-40) injection 40 mg (Completed)   baclofen (LIORESAL) 10 MG tablet   predniSONE (DELTASONE) 10 MG tablet        Follow up plan:  Return if symptoms worsen or fail to  improve.

## 2023-06-06 ENCOUNTER — Ambulatory Visit: Payer: BLUE CROSS/BLUE SHIELD | Admitting: Family Medicine

## 2023-06-20 ENCOUNTER — Telehealth: Payer: Self-pay | Admitting: Family Medicine

## 2023-06-20 ENCOUNTER — Other Ambulatory Visit: Payer: Self-pay | Admitting: Family Medicine

## 2023-06-20 MED ORDER — METOPROLOL SUCCINATE ER 50 MG PO TB24: 50.0000 mg | ORAL_TABLET | Freq: Every day | ORAL | 0 refills | Status: DC

## 2023-06-20 MED ORDER — VENLAFAXINE HCL ER 75 MG PO CP24: ORAL_CAPSULE | ORAL | 0 refills | Status: DC

## 2023-06-20 MED ORDER — BUPROPION HCL ER (XL) 300 MG PO TB24: 300.0000 mg | ORAL_TABLET | Freq: Every day | ORAL | 0 refills | Status: DC

## 2023-06-20 MED ORDER — VENLAFAXINE HCL ER 150 MG PO CP24: 150.0000 mg | ORAL_CAPSULE | Freq: Every day | ORAL | 0 refills | Status: DC

## 2023-06-20 NOTE — Telephone Encounter (Signed)
Medication Refill - Medication:  venlafaxine XR (EFFEXOR-XR) 75 MG 24 hr capsule   venlafaxine XR (EFFEXOR-XR) 150 MG 24 hr capsule   buPROPion (WELLBUTRIN XL) 300 MG 24 hr tablet   metoprolol succinate (TOPROL-XL) 50 MG 24 hr table  Has the patient contacted their pharmacy? No. I advised pt to call them next time. Preferred Pharmacy (with phone number or street name): [facility] - [place], [place] - [address]  Has the patient been seen for an appointment in the last year OR does the patient have an upcoming appointment? Yes.    Agent: Please be advised that RX refills may take up to 3 business days. We ask that you follow-up with your pharmacy.

## 2023-06-20 NOTE — Telephone Encounter (Signed)
Pt reports that he does not have any pills left and requests that the Rx refills be sent to his pharmacy asap.   Medication Refill - Medication: venlafaxine XR (EFFEXOR-XR) 75 MG 24 hr capsule, venlafaxine XR (EFFEXOR-XR) 150 MG 24 hr capsule, buPROPion (WELLBUTRIN XL) 300 MG 24 hr tablet, and metoprolol succinate (TOPROL-XL) 50 MG 24 hr tablet  Has the patient contacted their pharmacy? Yes.    Preferred Pharmacy (with phone number or street name):  [facility] - [place], [place] - [address] Phone: [phone]  Fax: [phone]     Has the patient been seen for an appointment in the last year OR does the patient have an upcoming appointment? Yes.    Agent: Please be advised that RX refills may take up to 3 business days. We ask that you follow-up with your pharmacy.

## 2023-06-20 NOTE — Telephone Encounter (Signed)
Courtesy refill given, appointment needed.    Requested Prescriptions  Pending Prescriptions Disp Refills   venlafaxine XR (EFFEXOR-XR) 150 MG 24 hr capsule 30 capsule 0    Sig: Take 1 capsule (150 mg total) by mouth daily with breakfast. To be taken with 75mg  for 225mg  daily total     Psychiatry: Antidepressants - SNRI - desvenlafaxine & venlafaxine Failed - 06/20/2023  9:24 AM      Failed - Cr in normal range and within 360 days    Creatinine, Ser  Date Value Ref Range Status  11/23/2022 1.84 (H) 0.76 - 1.27 mg/dL Final         Failed - Lipid Panel in normal range within the last 12 months    Cholesterol, Total  Date Value Ref Range Status  11/23/2022 234 (H) 100 - 199 mg/dL Final   LDL Chol Calc (NIH)  Date Value Ref Range Status  11/23/2022 135 (H) 0 - 99 mg/dL Final   HDL  Date Value Ref Range Status  11/23/2022 34 (L) >39 mg/dL Final   Triglycerides  Date Value Ref Range Status  11/23/2022 357 (H) 0 - 149 mg/dL Final         Passed - Last BP in normal range    BP Readings from Last 1 Encounters:  12/27/22 136/88         Passed - Valid encounter within last 6 months    Recent Outpatient Visits           5 months ago Acute left-sided thoracic back pain   [place] [facility] [place], Megan P, DO   6 months ago Primary hypertension   [place] [facility] [place], [place], DO   1 year ago Primary hypertension   [place] [facility] [place], [place], DO   1 year ago Primary hypertension   [place] [facility] [place], [place] P, DO   2 years ago Routine general medical examination at a health care facility   [facility], Megan P, DO               venlafaxine XR (EFFEXOR-XR) 75 MG 24 hr capsule 30 capsule 0    Sig: To be taken with 150mg  for 225mg  daily total. OFFICE VISIT NEEDED FOR ADDITIONAL REFILLS     Psychiatry: Antidepressants - SNRI - desvenlafaxine &  venlafaxine Failed - 06/20/2023  9:24 AM      Failed - Cr in normal range and within 360 days    Creatinine, Ser  Date Value Ref Range Status  11/23/2022 1.84 (H) 0.76 - 1.27 mg/dL Final         Failed - Lipid Panel in normal range within the last 12 months    Cholesterol, Total  Date Value Ref Range Status  11/23/2022 234 (H) 100 - 199 mg/dL Final   LDL Chol Calc (NIH)  Date Value Ref Range Status  11/23/2022 135 (H) 0 - 99 mg/dL Final   HDL  Date Value Ref Range Status  11/23/2022 34 (L) >39 mg/dL Final   Triglycerides  Date Value Ref Range Status  11/23/2022 357 (H) 0 - 149 mg/dL Final         Passed - Last BP in normal range    BP Readings from Last 1 Encounters:  12/27/22 136/88         Passed - Valid encounter within last 6 months    Recent Outpatient Visits  5 months ago Acute left-sided thoracic back pain   [place] [facility] [place], [place], DO   6 months ago Primary hypertension   [place] [facility] [place], [place], DO   1 year ago Primary hypertension   [place] [facility] [place], [place], DO   1 year ago Primary hypertension   [place] [facility] [place], [place] P, DO   2 years ago Routine general medical examination at a health care facility   [facility], [place] P, DO               metoprolol succinate (TOPROL-XL) 50 MG 24 hr tablet 30 tablet 0    Sig: Take 1 tablet (50 mg total) by mouth daily. OFFICE VISIT NEEDED FOR ADDITIONAL REFILLS     Cardiovascular:  Beta Blockers Passed - 06/20/2023  9:24 AM      Passed - Last BP in normal range    BP Readings from Last 1 Encounters:  12/27/22 136/88         Passed - Last Heart Rate in normal range    Pulse Readings from Last 1 Encounters:  12/27/22 61         Passed - Valid encounter within last 6 months    Recent Outpatient Visits           5 months ago Acute left-sided  thoracic back pain   [place] [facility] [place], Megan P, DO   6 months ago Primary hypertension   [place] [facility] [place], [place], DO   1 year ago Primary hypertension   [place] [facility] [place], [place], DO   1 year ago Primary hypertension   [place] [facility], [place] P, DO   2 years ago Routine general medical examination at a health care facility   [facility] Megan P, DO               buPROPion (WELLBUTRIN XL) 300 MG 24 hr tablet 30 tablet 0    Sig: Take 1 tablet (300 mg total) by mouth daily. OFFICE VISIT NEEDED FOR ADDITIONAL REFILLS     Psychiatry: Antidepressants - bupropion Failed - 06/20/2023  9:24 AM      Failed - Cr in normal range and within 360 days    Creatinine, Ser  Date Value Ref Range Status  11/23/2022 1.84 (H) 0.76 - 1.27 mg/dL Final         Passed - AST in normal range and within 360 days    AST  Date Value Ref Range Status  11/23/2022 22 0 - 40 IU/L Final         Passed - ALT in normal range and within 360 days    ALT  Date Value Ref Range Status  11/23/2022 28 0 - 44 IU/L Final         Passed - Last BP in normal range    BP Readings from Last 1 Encounters:  12/27/22 136/88         Passed - Valid encounter within last 6 months    Recent Outpatient Visits           5 months ago Acute left-sided thoracic back pain   [place] [facility] [place] , Megan P, DO   6 months ago Primary hypertension   [place] [facility] [place], [place], DO  1 year ago Primary hypertension   [place] [facility] [place], [place], DO   1 year ago Primary hypertension   [place] [facility] [place], [place] P, DO   2 years ago Routine general medical examination at a health care facility   [facility], [place] P, [place]

## 2023-06-20 NOTE — Telephone Encounter (Signed)
Refill sent in today for courtesy refill in a separate encounter.

## 2023-07-19 ENCOUNTER — Telehealth: Payer: Self-pay | Admitting: Family Medicine

## 2023-07-19 ENCOUNTER — Other Ambulatory Visit: Payer: Self-pay | Admitting: Family Medicine

## 2023-07-19 MED ORDER — TADALAFIL 20 MG PO TABS: 10.0000 mg | ORAL_TABLET | ORAL | 6 refills | Status: DC | PRN

## 2023-07-19 MED ORDER — VENLAFAXINE HCL ER 75 MG PO CP24: ORAL_CAPSULE | ORAL | 0 refills | Status: DC

## 2023-07-19 MED ORDER — VENLAFAXINE HCL ER 150 MG PO CP24: 150.0000 mg | ORAL_CAPSULE | Freq: Every day | ORAL | 0 refills | Status: DC

## 2023-07-19 MED ORDER — BUPROPION HCL ER (XL) 300 MG PO TB24: 300.0000 mg | ORAL_TABLET | Freq: Every day | ORAL | 0 refills | Status: DC

## 2023-07-19 MED ORDER — METOPROLOL SUCCINATE ER 50 MG PO TB24: 50.0000 mg | ORAL_TABLET | Freq: Every day | ORAL | 0 refills | Status: DC

## 2023-07-19 NOTE — Telephone Encounter (Signed)
Patient called stated he is out of meds and need to get at least worth before he would be able to come into the office. 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 Please f/u with patient

## 2023-07-19 NOTE — Telephone Encounter (Signed)
Medication Refill -  Most Recent Primary Care Visit:  Provider: Olevia Perches P  Department: [facility]  Visit Type: OFFICE VISIT  Date: 12/27/2022  Medication: buPROPion (WELLBUTRIN XL) 300 MG 24 hr tablet metoprolol succinate (TOPROL-XL) 50 MG 24 hr tablet venlafaxine XR (EFFEXOR-XR) 150 MG 24 hr capsule  venlafaxine XR (EFFEXOR-XR) 75 MG 24 hr capsule   Has the patient contacted their pharmacy? No  Is this the correct pharmacy for this prescription? Yes  This is the patient's preferred pharmacy:  [facility] - [place], [place] - [address] [place] [address] Phone: [phone] Fax: [phone]   Has the prescription been filled recently? Yes  Is the patient out of the medication? Yes  Has the patient been seen for an appointment in the last year OR does the patient have an upcoming appointment? Yes  Can we respond through MyChart? No  Agent: Please be advised that Rx refills may take up to 3 business days. We ask that you follow-up with your pharmacy.

## 2023-07-19 NOTE — Telephone Encounter (Signed)
Tried calling patient. Called states that the call cannot be completed as dialed. Will try to call again later.

## 2023-07-19 NOTE — Telephone Encounter (Signed)
3 month supply sent in, but I will need to see hi for further refills- let me know if he wants me to have the social worker to call him to see if he qualifies for medicaid?

## 2023-07-19 NOTE — Telephone Encounter (Signed)
Routing to provider to advise and send in prescriptions if appropriate.

## 2023-07-20 NOTE — Telephone Encounter (Signed)
Patient requesting refills. Contacted pharmacy regarding refills and pharmacy waiting on patient to submit insurance prior to dispensing medications.  Duplicate request. Receipt confirmed by pharmacy 07/19/23 at 3:02 pm.  Requested Prescriptions  Refused Prescriptions Disp Refills   buPROPion (WELLBUTRIN XL) 300 MG 24 hr tablet 90 tablet 0    Sig: Take 1 tablet (300 mg total) by mouth daily. OFFICE VISIT NEEDED FOR ADDITIONAL REFILLS     Psychiatry: Antidepressants - bupropion Failed - 07/20/2023 11:58 AM      Failed - Cr in normal range and within 360 days    Creatinine, Ser  Date Value Ref Range Status  11/23/2022 1.84 (H) 0.76 - 1.27 mg/dL Final         Failed - Valid encounter within last 6 months    Recent Outpatient Visits           6 months ago Acute left-sided thoracic back pain   [place] [facility] [place], Megan P, DO   7 months ago Primary hypertension   [place] [facility] [place], [place], DO   1 year ago Primary hypertension   [place] [facility] [place], [place], DO   1 year ago Primary hypertension   [place] [facility] [place], [place] P, DO   2 years ago Routine general medical examination at a health care facility   [place] [facility], [facility] P, DO              Passed - AST in normal range and within 360 days    AST  Date Value Ref Range Status  11/23/2022 22 0 - 40 IU/L Final         Passed - ALT in normal range and within 360 days    ALT  Date Value Ref Range Status  11/23/2022 28 0 - 44 IU/L Final         Passed - Last BP in normal range    BP Readings from Last 1 Encounters:  12/27/22 136/88          metoprolol succinate (TOPROL-XL) 50 MG 24 hr tablet 90 tablet 0    Sig: Take 1 tablet (50 mg total) by mouth daily. OFFICE VISIT NEEDED FOR ADDITIONAL REFILLS     Cardiovascular:  Beta Blockers Failed - 07/20/2023 11:58 AM      Failed - Valid  encounter within last 6 months    Recent Outpatient Visits           6 months ago Acute left-sided thoracic back pain   [place] [facility] [place], Megan P, DO   7 months ago Primary hypertension   [place] [facility] [place], [place], DO   1 year ago Primary hypertension   [place] [facility] [place], [place], DO   1 year ago Primary hypertension   [place] [facility] [place], [place] P, DO   2 years ago Routine general medical examination at a health care facility   [facility], [facility], DO              Passed - Last BP in normal range    BP Readings from Last 1 Encounters:  12/27/22 136/88         Passed - Last Heart Rate in normal range    Pulse Readings from Last 1 Encounters:  12/27/22 61

## 2023-07-20 NOTE — Telephone Encounter (Signed)
Tried calling patient. Called states that the call cannot be completed as dialed. Will try to call again later.

## 2023-07-20 NOTE — Telephone Encounter (Signed)
Called patient to schedule appt for medication refills. [phone]  no answer, recording call can not be completed as dialed . Call again later. Unable to leave message

## 2023-07-20 NOTE — Telephone Encounter (Signed)
Called pharmacy to verify medication refill requests received. Pharmacy staff reports medications requested are on file for refill. Waiting on patient to submit insurance to dispense.

## 2023-10-24 ENCOUNTER — Other Ambulatory Visit: Payer: Self-pay | Admitting: Family Medicine

## 2023-10-25 NOTE — Telephone Encounter (Signed)
 Requested medications are due for refill today.  yes  Requested medications are on the active medications list.  yes  Last refill. 07/19/2023 #90 0 rf for all 3  Future visit scheduled.   no  Notes to clinic.  Pt is more than 3 months overdue for ov. Called pt. "Call cannot be completed at this time".    Requested Prescriptions  Pending Prescriptions Disp Refills   buPROPion (WELLBUTRIN XL) 300 MG 24 hr tablet [Pharmacy Med Name: BUPROPION HCL XL 300 MG TABLET] 90 tablet 0    Sig: Take 1 tablet (300 mg total) by mouth daily. OFFICE VISIT NEEDED FOR ADDITIONAL REFILLS     Psychiatry: Antidepressants - bupropion Failed - 10/25/2023  4:55 PM      Failed - Cr in normal range and within 360 days    Creatinine, Ser  Date Value Ref Range Status  11/23/2022 1.84 (H) 0.76 - 1.27 mg/dL Final         Failed - Valid encounter within last 6 months    Recent Outpatient Visits           10 months ago Acute left-sided thoracic back pain   [place] [facility] [place], Megan P, DO   11 months ago Primary hypertension   [place] [facility] [place], [place], DO   1 year ago Primary hypertension   [place] [place], [place], DO   2 years ago Primary hypertension   [place] [place], [place], DO   2 years ago Routine general medical examination at a health care facility   [facility], Megan P, DO              Passed - AST in normal range and within 360 days    AST  Date Value Ref Range Status  11/23/2022 22 0 - 40 IU/L Final         Passed - ALT in normal range and within 360 days    ALT  Date Value Ref Range Status  11/23/2022 28 0 - 44 IU/L Final         Passed - Last BP in normal range    BP Readings from Last 1 Encounters:  12/27/22 136/88          metoprolol succinate (TOPROL-XL) 50 MG 24 hr tablet [Pharmacy Med Name: METOPROLOL SUCC ER 50 MG TAB] 90  tablet 0    Sig: Take 1 tablet (50 mg total) by mouth daily. OFFICE VISITNEEDED FOR ADDITIONAL REFILLS     Cardiovascular:  Beta Blockers Failed - 10/25/2023  4:55 PM      Failed - Valid encounter within last 6 months    Recent Outpatient Visits           10 months ago Acute left-sided thoracic back pain   [facility] [place], Megan P, DO   11 months ago Primary hypertension   [place] [facility] [place], [place], DO   1 year ago Primary hypertension   [place] [facility] [place], [place], DO   2 years ago Primary hypertension   [place] [facility] [place], [place] P, DO   2 years ago Routine general medical examination at a health care facility   [facility], Megan P, DO              Passed - Last BP  in normal range    BP Readings from Last 1 Encounters:  12/27/22 136/88         Passed - Last Heart Rate in normal range    Pulse Readings from Last 1 Encounters:  12/27/22 61          venlafaxine XR (EFFEXOR-XR) 150 MG 24 hr capsule [Pharmacy Med Name: VENLAFAXINE HCL ER 150 MG CAP] 90 capsule 0    Sig: Take 1 capsule (150 mg total) by mouth daily with breakfast. To be taken with 75mg  for 225mg  daily total     Psychiatry: Antidepressants - SNRI - desvenlafaxine & venlafaxine Failed - 10/25/2023  4:55 PM      Failed - Cr in normal range and within 360 days    Creatinine, Ser  Date Value Ref Range Status  11/23/2022 1.84 (H) 0.76 - 1.27 mg/dL Final         Failed - Valid encounter within last 6 months    Recent Outpatient Visits           10 months ago Acute left-sided thoracic back pain   [place] [facility] [place], Megan P, DO   11 months ago Primary hypertension   [place] [facility] [place], [place], DO   1 year ago Primary hypertension   [place] [facility] [place], [place], DO   2 years ago Primary  hypertension   [place] [facility] [place], [place], DO   2 years ago Routine general medical examination at a health care facility   [place] [facility] [place], [place] P, DO              Failed - Lipid Panel in normal range within the last 12 months    Cholesterol, Total  Date Value Ref Range Status  11/23/2022 234 (H) 100 - 199 mg/dL Final   LDL Chol Calc (NIH)  Date Value Ref Range Status  11/23/2022 135 (H) 0 - 99 mg/dL Final   HDL  Date Value Ref Range Status  11/23/2022 34 (L) >39 mg/dL Final   Triglycerides  Date Value Ref Range Status  11/23/2022 357 (H) 0 - 149 mg/dL Final         Passed - Last BP in normal range    BP Readings from Last 1 Encounters:  12/27/22 136/88          venlafaxine XR (EFFEXOR-XR) 75 MG 24 hr capsule [Pharmacy Med Name: VENLAFAXINE HCL ER 75 MG CAP] 90 capsule 0    Sig: To be taken with 150mg  for 225mg  daily total. OFFICE VISITNEEDED FOR ADDITIONAL REFILLS     Psychiatry: Antidepressants - SNRI - desvenlafaxine & venlafaxine Failed - 10/25/2023  4:55 PM      Failed - Cr in normal range and within 360 days    Creatinine, Ser  Date Value Ref Range Status  11/23/2022 1.84 (H) 0.76 - 1.27 mg/dL Final         Failed - Valid encounter within last 6 months    Recent Outpatient Visits           10 months ago Acute left-sided thoracic back pain   [place] [facility] [place], Megan P, DO   11 months ago Primary hypertension    [facility] [place], [place], DO   1 year ago Primary hypertension    [facility] [place], [place], DO   2 years ago Primary  hypertension   [facility] [place], [place] P, DO   2 years ago Routine general medical examination at a health care facility   [facility] [place], [place] P, DO              Failed - Lipid Panel in normal range within the last 12 months     Cholesterol, Total  Date Value Ref Range Status  11/23/2022 234 (H) 100 - 199 mg/dL Final   LDL Chol Calc (NIH)  Date Value Ref Range Status  11/23/2022 135 (H) 0 - 99 mg/dL Final   HDL  Date Value Ref Range Status  11/23/2022 34 (L) >39 mg/dL Final   Triglycerides  Date Value Ref Range Status  11/23/2022 357 (H) 0 - 149 mg/dL Final         Passed - Last BP in normal range    BP Readings from Last 1 Encounters:  12/27/22 136/88

## 2023-10-25 NOTE — Telephone Encounter (Signed)
 Called pt - machine states that call could not be completed at this time.

## 2023-10-26 ENCOUNTER — Telehealth: Payer: Self-pay

## 2023-10-26 NOTE — Telephone Encounter (Signed)
 Attempted to call patient

## 2023-10-26 NOTE — Telephone Encounter (Signed)
 Has not been seen in almost a year. Needs appt.

## 2023-10-26 NOTE — Telephone Encounter (Signed)
 Attempted to reach patient but unable to leave message. If he calls back please advise appointment is needed prior to any medication refills being sent to pharmacy.

## 2023-10-28 ENCOUNTER — Encounter: Payer: Self-pay | Admitting: Family Medicine

## 2023-10-28 ENCOUNTER — Ambulatory Visit (INDEPENDENT_AMBULATORY_CARE_PROVIDER_SITE_OTHER): Payer: Self-pay | Admitting: Family Medicine

## 2023-10-28 VITALS — BP 173/101 | HR 71 | Temp 98.7°F | Resp 20 | Wt 312.4 lb

## 2023-10-28 DIAGNOSIS — F4001 Agoraphobia with panic disorder: Secondary | ICD-10-CM

## 2023-10-28 DIAGNOSIS — Z599 Problem related to housing and economic circumstances, unspecified: Secondary | ICD-10-CM

## 2023-10-28 DIAGNOSIS — I10 Essential (primary) hypertension: Secondary | ICD-10-CM

## 2023-10-28 MED ORDER — QUETIAPINE FUMARATE 25 MG PO TABS: 25.0000 mg | ORAL_TABLET | Freq: Every day | ORAL | 1 refills | Status: DC

## 2023-10-28 MED ORDER — LISINOPRIL 10 MG PO TABS: 10.0000 mg | ORAL_TABLET | Freq: Every day | ORAL | 1 refills | Status: DC

## 2023-10-28 MED ORDER — BUPROPION HCL ER (XL) 300 MG PO TB24: 300.0000 mg | ORAL_TABLET | Freq: Every day | ORAL | 1 refills | Status: DC

## 2023-10-28 MED ORDER — VENLAFAXINE HCL ER 75 MG PO CP24: ORAL_CAPSULE | ORAL | 1 refills | Status: DC

## 2023-10-28 MED ORDER — METOPROLOL SUCCINATE ER 50 MG PO TB24: 50.0000 mg | ORAL_TABLET | Freq: Every day | ORAL | 1 refills | Status: DC

## 2023-10-28 MED ORDER — VENLAFAXINE HCL ER 150 MG PO CP24: 150.0000 mg | ORAL_CAPSULE | Freq: Every day | ORAL | 1 refills | Status: DC

## 2023-10-28 NOTE — Progress Notes (Signed)
 BP (!) 173/101 (BP Location: Left Arm, Patient Position: Sitting, Cuff Size: Large)   Pulse 71   Temp 98.7 F (37.1 C) (Oral)   Resp 20   Wt (!) 312 lb 6.4 oz (141.7 kg)   SpO2 97%   BMI 44.51 kg/m    Subjective:    Patient ID: Brady Edwards, male    DOB: Sep 10, 1965, 58 y.o.   MRN: [id number]  HPI: Brady Edwards is a 58 y.o. male  Chief Complaint  Patient presents with   Depression   Suicidality    No current plans but has had SI recently due to not working, no motivation and isolating himself. Has his roommate and ex girlfriend for support but roommate is moving out middle of the month. Already struggling to afford bills and having to donate plasma 2x weekly   DEPRESSION- has had issues keeping a job. has Mood status: uncontrolled Satisfied with current treatment?: no Symptom severity: severe  Duration of current treatment : years Side effects: no Medication compliance: fair compliance Psychotherapy/counseling: no  Previous psychiatric medications: effexor, wellbutrin Depressed mood: yes Anxious mood: yes Anhedonia: yes Significant weight loss or gain: yes Insomnia: yes  Fatigue: yes Feelings of worthlessness or guilt: yes Impaired concentration/indecisiveness: yes Suicidal ideations: yes Hopelessness: yes Crying spells: yes    12/27/2022   10:55 AM 04/14/2022    9:41 AM 08/14/2021    4:26 PM 02/12/2021    3:38 PM 01/12/2021    4:20 PM  Depression screen PHQ 2/9  Decreased Interest 1 2 3 2 3   Down, Depressed, Hopeless 1 2 3 2 1   PHQ - 2 Score 2 4 6 4 4   Altered sleeping 1 0 2 3 3   Tired, decreased energy 2 1 0 3 3  Change in appetite 1 1 0 2 3  Feeling bad or failure about yourself  1 1 3 2 2   Trouble concentrating 1 1 2  0 2  Moving slowly or fidgety/restless 0 0 0 0 1  Suicidal thoughts 0 0 0 0 0  PHQ-9 Score 8 8 13 14 18   Difficult doing work/chores Somewhat difficult Somewhat difficult  Somewhat difficult Extremely dIfficult      12/27/2022    10:55 AM 04/14/2022    9:41 AM 08/14/2021    4:27 PM 01/12/2021    4:22 PM  GAD 7 : Generalized Anxiety Score  Nervous, Anxious, on Edge 1 2 1 2   Control/stop worrying 1 1 3 3   Worry too much - different things 1 1 2 3   Trouble relaxing 1 1 0 2  Restless 1 1 0 1  Easily annoyed or irritable 1 1 0 2  Afraid - awful might happen 1 1 1 3   Total GAD 7 Score 7 8 7 16   Anxiety Difficulty Somewhat difficult Somewhat difficult Somewhat difficult Extremely difficult    HYPERTENSION  Hypertension status: uncontrolled  Satisfied with current treatment? no Duration of hypertension: chronic BP monitoring frequency:   2x a week at plasma BP range: Ok for plasma, but barely BP medication side effects:  no Medication compliance: fair compliance Previous BP meds:metoprolol Aspirin: no Recurrent headaches: no Visual changes: no Palpitations: no Dyspnea: no Chest pain: no Lower extremity edema: no Dizzy/lightheaded: no  Relevant past medical, surgical, family and social history reviewed and updated as indicated. Interim medical history since our last visit reviewed. Allergies and medications reviewed and updated.  Review of Systems  Constitutional:  Positive for fatigue. Negative for activity change,  appetite change, chills, diaphoresis, fever and unexpected weight change.  Respiratory: Negative.    Cardiovascular: Negative.   Gastrointestinal: Negative.   Musculoskeletal: Negative.   Psychiatric/Behavioral:  Positive for decreased concentration, dysphoric mood, sleep disturbance and suicidal ideas. Negative for agitation, behavioral problems, confusion, hallucinations and self-injury. The patient is nervous/anxious. The patient is not hyperactive.     Per HPI unless specifically indicated above     Objective:    BP (!) 173/101 (BP Location: Left Arm, Patient Position: Sitting, Cuff Size: Large)   Pulse 71   Temp 98.7 F (37.1 C) (Oral)   Resp 20   Wt (!) 312 lb 6.4 oz (141.7 kg)    SpO2 97%   BMI 44.51 kg/m   Wt Readings from Last 3 Encounters:  10/28/23 (!) 312 lb 6.4 oz (141.7 kg)  12/27/22 282 lb 4.8 oz (128.1 kg)  11/23/22 274 lb 11.2 oz (124.6 kg)    Physical Exam Vitals and nursing note reviewed.  Constitutional:      General: He is not in acute distress.    Appearance: Normal appearance. He is well-developed. He is obese. He is not ill-appearing, toxic-appearing or diaphoretic.  HENT:     Head: Normocephalic and atraumatic.     Right Ear: External ear normal.     Left Ear: External ear normal.     Nose: Nose normal.     Mouth/Throat:     Mouth: Mucous membranes are moist.     Pharynx: Oropharynx is clear.  Eyes:     General: No scleral icterus.       Right eye: No discharge.        Left eye: No discharge.     Extraocular Movements: Extraocular movements intact.     Conjunctiva/sclera: Conjunctivae normal.     Pupils: Pupils are equal, round, and reactive to light.  Cardiovascular:     Rate and Rhythm: Normal rate and regular rhythm.     Pulses: Normal pulses.     Heart sounds: Normal heart sounds. No murmur heard.    No friction rub. No gallop.  Pulmonary:     Effort: Pulmonary effort is normal. No respiratory distress.     Breath sounds: Normal breath sounds. No stridor. No wheezing, rhonchi or rales.  Chest:     Chest wall: No tenderness.  Musculoskeletal:        General: Normal range of motion.     Cervical back: Normal range of motion and neck supple.  Skin:    General: Skin is warm and dry.     Capillary Refill: Capillary refill takes less than 2 seconds.     Coloration: Skin is not jaundiced or pale.     Findings: No bruising, erythema, lesion or rash.  Neurological:     General: No focal deficit present.     Mental Status: He is alert and oriented to person, place, and time. Mental status is at baseline.  Psychiatric:        Mood and Affect: Mood normal.        Behavior: Behavior normal.        Thought Content: Thought content  normal.        Judgment: Judgment normal.     Results for orders placed or performed in visit on 11/23/22  Microalbumin, Urine Waived   Collection Time: 11/23/22 10:45 AM  Result Value Ref Range   Microalb, Ur Waived 150 (H) 0 - 19 mg/L   Creatinine, Urine Waived 50 10 -  300 mg/dL   Microalb/Creat Ratio >300 (H) <30 mg/g  Comprehensive metabolic panel   Collection Time: 11/23/22 10:46 AM  Result Value Ref Range   Glucose 110 (H) 70 - 99 mg/dL   BUN 33 (H) 6 - 24 mg/dL   Creatinine, Ser 1.61 (H) 0.76 - 1.27 mg/dL   eGFR 42 (L) >09 UE/AVW/0.98   BUN/Creatinine Ratio 18 9 - 20   Sodium 141 134 - 144 mmol/L   Potassium 4.7 3.5 - 5.2 mmol/L   Chloride 106 96 - 106 mmol/L   CO2 18 (L) 20 - 29 mmol/L   Calcium 8.9 8.7 - 10.2 mg/dL   Total Protein 6.7 6.0 - 8.5 g/dL   Albumin 4.0 3.8 - 4.9 g/dL   Globulin, Total 2.7 1.5 - 4.5 g/dL   Albumin/Globulin Ratio 1.5 1.2 - 2.2   Bilirubin Total 0.3 0.0 - 1.2 mg/dL   Alkaline Phosphatase 122 (H) 44 - 121 IU/L   AST 22 0 - 40 IU/L   ALT 28 0 - 44 IU/L  Lipid Panel w/o Chol/HDL Ratio   Collection Time: 11/23/22 10:46 AM  Result Value Ref Range   Cholesterol, Total 234 (H) 100 - 199 mg/dL   Triglycerides 119 (H) 0 - 149 mg/dL   HDL 34 (L) >14 mg/dL   VLDL Cholesterol Cal 65 (H) 5 - 40 mg/dL   LDL Chol Calc (NIH) 782 (H) 0 - 99 mg/dL  PSA   Collection Time: 11/23/22 10:46 AM  Result Value Ref Range   Prostate Specific Ag, Serum 1.0 0.0 - 4.0 ng/mL      Assessment & Plan:   Problem List Items Addressed This Visit       Cardiovascular and Mediastinum   Hypertension   Running high. Will restart his metoprolol and add lisinopril. Recheck 2 weeks.       Relevant Medications   metoprolol succinate (TOPROL-XL) 50 MG 24 hr tablet   lisinopril (ZESTRIL) 10 MG tablet   Other Relevant Orders   AMB Referral VBCI Care Management     Other   Panic disorder with agoraphobia - Primary   Not under good control. Has been off his medicine  for 2 days- will restart and add Seroquel. Recheck in 2 weeks. Call with any concerns.       Relevant Medications   buPROPion (WELLBUTRIN XL) 300 MG 24 hr tablet   venlafaxine XR (EFFEXOR-XR) 150 MG 24 hr capsule   venlafaxine XR (EFFEXOR-XR) 75 MG 24 hr capsule   Other Relevant Orders   AMB Referral VBCI Care Management   Other Visit Diagnoses       Financial difficulties       Will get him hooked up with pharmacy and social work to help with resources. Call with any concerns. Continue to monitor.   Relevant Orders   AMB Referral VBCI Care Management        Follow up plan: Return in about 2 weeks (around 11/11/2023).

## 2023-10-28 NOTE — Assessment & Plan Note (Signed)
 Not under good control. Has been off his medicine for 2 days- will restart and add Seroquel. Recheck in 2 weeks. Call with any concerns.

## 2023-10-28 NOTE — Assessment & Plan Note (Signed)
 Running high. Will restart his metoprolol and add lisinopril. Recheck 2 weeks.

## 2023-10-31 ENCOUNTER — Telehealth: Payer: Self-pay | Admitting: *Deleted

## 2023-10-31 NOTE — Progress Notes (Signed)
 Complex Care Management Note  Care Guide Note 10/31/2023 Name: Brady Edwards MRN: [id number] DOB: 09/09/1965  Brady Edwards is a 58 y.o. year old male who sees Dorcas Carrow, DO for primary care. I reached out to [name] by phone today to offer complex care management services.  Mr. Euceda was given information about Complex Care Management services today including:   The Complex Care Management services include support from the care team which includes your Nurse Care Manager, Clinical Social Worker, or Pharmacist.  The Complex Care Management team is here to help remove barriers to the health concerns and goals most important to you. Complex Care Management services are voluntary, and the patient may decline or stop services at any time by request to their care team member.   Complex Care Management Consent Status: Patient agreed to services and verbal consent obtained.   Follow up plan:  Telephone appointment with complex care management team member scheduled for:  11/11/2023  Encounter Outcome:  Patient Scheduled  Burman Nieves, CMA, Care Guide [facility]  [facility], [facility] Guide Direct Dial: [phone]  Fax: [phone] Website: [email or web address]

## 2023-11-10 ENCOUNTER — Other Ambulatory Visit: Payer: Self-pay

## 2023-11-10 NOTE — Patient Outreach (Signed)
  Medicaid Managed Care   Unsuccessful Outreach Note  11/10/2023 Name: Brady Edwards MRN: [id number] DOB: [birth year]  Referred by: Dorcas Carrow, DO Reason for referral : High Risk Managed Medicaid (MM social work unsuccessful telephone outreach )   An unsuccessful telephone outreach was attempted today. The patient was referred to the case management team for assistance with care management and care coordination.   Follow Up Plan: A HIPAA compliant phone message was left for the patient providing contact information and requesting a return call.   Abelino Derrick, MHA [facility]  Managed [facility] Social Worker [phone]

## 2023-11-10 NOTE — Patient Instructions (Signed)
  Medicaid Managed Care   Unsuccessful Outreach Note  11/10/2023 Name: Brady Edwards MRN: [id number] DOB: [birth year]  Referred by: Dorcas Carrow, DO Reason for referral : High Risk Managed Medicaid (MM social work unsuccessful telephone outreach )   An unsuccessful telephone outreach was attempted today. The patient was referred to the case management team for assistance with care management and care coordination.   Follow Up Plan: A HIPAA compliant phone message was left for the patient providing contact information and requesting a return call.   Abelino Derrick, MHA [facility]  Managed [facility] Social Worker [phone]

## 2023-11-15 ENCOUNTER — Ambulatory Visit: Payer: Self-pay | Admitting: Family Medicine

## 2023-11-17 ENCOUNTER — Telehealth: Payer: Self-pay

## 2023-11-25 ENCOUNTER — Other Ambulatory Visit: Payer: Self-pay | Admitting: Family Medicine

## 2023-11-28 NOTE — Telephone Encounter (Signed)
 Last OV 10/28/23 within protocol.  Requested Prescriptions  Pending Prescriptions Disp Refills   venlafaxine XR (EFFEXOR-XR) 150 MG 24 hr capsule [Pharmacy Med Name: VENLAFAXINE HCL ER 150 MG CAP] 90 capsule 0    Sig: Take 1 capsule (150 mg total) by mouth daily with breakfast. To be taken with 75mg  for 225mg  daily total     Psychiatry: Antidepressants - SNRI - desvenlafaxine & venlafaxine Failed - 11/28/2023  3:14 PM      Failed - Cr in normal range and within 360 days    Creatinine, Ser  Date Value Ref Range Status  11/23/2022 1.84 (H) 0.76 - 1.27 mg/dL Final         Failed - Last BP in normal range    BP Readings from Last 1 Encounters:  10/28/23 (!) 173/101         Failed - Valid encounter within last 6 months    Recent Outpatient Visits           1 month ago Panic disorder with agoraphobia   [place] [facility] [place], [place] P, DO              Failed - Lipid Panel in normal range within the last 12 months    Cholesterol, Total  Date Value Ref Range Status  11/23/2022 234 (H) 100 - 199 mg/dL Final   LDL Chol Calc (NIH)  Date Value Ref Range Status  11/23/2022 135 (H) 0 - 99 mg/dL Final   HDL  Date Value Ref Range Status  11/23/2022 34 (L) >39 mg/dL Final   Triglycerides  Date Value Ref Range Status  11/23/2022 357 (H) 0 - 149 mg/dL Final

## 2023-12-06 ENCOUNTER — Other Ambulatory Visit: Payer: Self-pay

## 2023-12-06 NOTE — Patient Outreach (Signed)
 Complex Care Management   Visit Note  12/06/2023  Name:  Brady Edwards MRN: [id number] DOB: 03-Dec-1965  Situation: Referral received for Complex Care Management related to  Applying for Medicaid and referring to LCSW  I obtained verbal consent from Patient.  Visit completed with Patient  on the phone  Background:   Past Medical History:  Diagnosis Date   Atrial fibrillation (HCC)    Depression    Hypertension     Assessment: Patient Reported Symptoms:  Cognitive    Neurological      HEENT      Cardiovascular      Respiratory      Endocrine      Gastrointestinal      Genitourinary      Integumentary      Musculoskeletal      Psychosocial       There were no vitals filed for this visit.  Medications Reviewed Today   Medications were not reviewed in this encounter     Recommendation:   Referral to: LCSW for mental health resources .   Follow Up Plan:   Telephone follow-up 15 days 12/20/23  Gus Puma, Kenard Gower, [facility]  Managed [facility] Social Worker [phone]

## 2023-12-06 NOTE — Patient Instructions (Signed)
 Visit Information  Thank you for taking time to visit with me today. Please don't hesitate to contact me if I can be of assistance to you before our next scheduled appointment.  Our next appointment is by telephone on 12/20/23 at 1pm Please call the care guide team at [phone] if you need to cancel or reschedule your appointment.   Following is a copy of your care plan:   Goals Addressed             This Visit's Progress    BSW VBCI Social Work Care Plan   On track    Problems:   Assistance with Medicaid  CSW Clinical Goal(s):   Over the next 30 days the Patient will demonstrate a reduction in symptoms related to Depression: BSW will refer patient to LCSW..    explore community resource options for unmet needs related to Insurance [facility]) .  Interventions:  Social Determinants of Health in Patient with  Obtaining insurance : SDOH assessments completed:  Medicaid Evaluation of current treatment plan related to unmet needs Referral to Department of Social Services to apply for Medicaid. Patient is aware of where to go due to already receiving foodstamps and getting assistance with his utilities.  Patient Goals/Self-Care Activities:  Contact/visit the [facility] of Social Services to apply for [employer]  Plan:   The care management team will reach out to the patient again over the next 15 days.        Please call the Suicide and Crisis Lifeline: 988 call the [place] National Suicide Prevention Lifeline: (718)198-4332 or TTY: 862-868-5263 TTY 979 507 4127) to talk to a trained counselor call 911 if you are experiencing a Mental Health or Behavioral Health Crisis or need someone to talk to.  The patient verbalized understanding of instructions, educational materials, and care plan provided today and agreed to receive a mailed copy of patient instructions, educational materials, and care plan.   Abelino Derrick, MHA [facility]  Managed  [facility] Social Worker [phone]

## 2023-12-15 ENCOUNTER — Encounter: Payer: Self-pay | Admitting: *Deleted

## 2023-12-15 ENCOUNTER — Other Ambulatory Visit: Payer: Self-pay | Admitting: *Deleted

## 2023-12-20 ENCOUNTER — Other Ambulatory Visit: Payer: Self-pay

## 2023-12-20 NOTE — Patient Outreach (Signed)
 Complex Care Management   Visit Note  12/20/2023  Name:  Brady Edwards MRN: [id number] DOB: [birth year]  Situation: Referral received for Complex Care Management related to Medicaid and Utility assistance I obtained verbal consent from Patient.  Visit completed with patient  on the phone  Background:   Past Medical History:  Diagnosis Date   Atrial fibrillation (HCC)    Depression    Hypertension     Assessment: Patient Reported Symptoms:  Patient has not applied for Medicaid and utiltiy assistance yet. Patient states he plans to apply before the end of the month. No other resources are needed at this time.    SDOH Interventions    Flowsheet Row Office Visit from 10/28/2023 in [facility] Office Visit from 04/14/2022 in [facility] Office Visit from 08/29/2018 in [facility]  SDOH Interventions     Food Insecurity Interventions Intervention Not Indicated -- --  Housing Interventions Intervention Not Indicated -- --  Transportation Interventions Intervention Not Indicated -- --  Utilities Interventions Intervention Not Indicated -- --  Depression Interventions/Treatment  -- Medication, Currently on Treatment Medication, Currently on Treatment        Recommendation:   PCP Follow-up Follow up with SW for any future needs.   Follow Up Plan:   Patient has met all care management goals. Care Management case will be closed. Patient has been provided contact information should new needs arise.   Valora Gear, Florestine Hurl, MHA [place]  [facility] Social Worker, Population Health [phone]

## 2023-12-20 NOTE — Patient Instructions (Signed)
 Visit Information  Thank you for taking time to visit with me today. Please don't hesitate to contact me if I can be of assistance to you before our next scheduled appointment.  Your next care management appointment is no further scheduled appointments.    Patient has met all care management goals. Care Management case will be closed. Patient has been provided contact information should new needs arise.   Please call the care guide team at [phone] if you need to cancel, schedule, or reschedule an appointment.   Please call the Suicide and Crisis Lifeline: 988 call the [place] National Suicide Prevention Lifeline: 307-436-8915 or TTY: 585-185-7541 TTY 360-558-0787) to talk to a trained counselor call 1-800-273-TALK (toll free, 24 hour hotline) call 911 if you are experiencing a Mental Health or Behavioral Health Crisis or need someone to talk to.  Gus Puma, Kenard Gower, MHA [place]  [facility] Social Worker, Population Health [phone]

## 2023-12-27 ENCOUNTER — Other Ambulatory Visit: Payer: Self-pay | Admitting: Family Medicine

## 2023-12-28 ENCOUNTER — Other Ambulatory Visit: Payer: Self-pay | Admitting: Family Medicine

## 2023-12-28 NOTE — Telephone Encounter (Signed)
 Patient called back to check status of request. Let him know that it was received but not yet completed. Patient understood. Please let him know if there are any problems or concerns, Thank You

## 2023-12-28 NOTE — Telephone Encounter (Signed)
 Copied from CRM [id number]. Topic: Clinical - Medication Refill >> Dec 28, 2023  2:40 PM Stanly Early wrote: Most Recent Primary Care Visit:  Provider: Terre Ferri P  Department: [facility]  Visit Type: OFFICE VISIT  Date: 10/28/2023  Medication: venlafaxine  XR (EFFEXOR -XR) 75 mg [phone] would like call to talk about other Rx has well. Made a call today   Has the patient contacted their pharmacy? Yes (Agent: If no, request that the patient contact the pharmacy for the refill. If patient does not wish to contact the pharmacy document the reason why and proceed with request.) (Agent: If yes, when and what did the pharmacy advise?)  Is this the correct pharmacy for this prescription? Yes If no, delete pharmacy and type the correct one.  This is the patient's preferred pharmacy:  [facility] - [place], [place] - [address] [place] [address] Phone: [phone] Fax: [phone]   Has the prescription been filled recently? No  Is the patient out of the medication? No  Has the patient been seen for an appointment in the last year OR does the patient have an upcoming appointment? Yes  Can we respond through MyChart? No  Agent: Please be advised that Rx refills may take up to 3 business days. We ask that you follow-up with your pharmacy.

## 2023-12-29 MED ORDER — VENLAFAXINE HCL ER 150 MG PO CP24: 150.0000 mg | ORAL_CAPSULE | Freq: Every day | ORAL | 0 refills | Status: DC

## 2023-12-29 NOTE — Telephone Encounter (Signed)
 Returned call to patient. He is aware the mistake was within the system and is aware it is now resolved. Dr. Lincoln Renshaw has approved his refills for 90 days and we scheduled his much needed follow up for 02/27/2024.

## 2023-12-29 NOTE — Telephone Encounter (Signed)
 Copied from CRM [id number]. Topic: Clinical - Medication Question >> Dec 29, 2023  8:19 AM [name] wrote: Reason for CRM: Patient called his pharmacy yesterday to find out why his prescriptions have not been filled.  Please return his call to let him know what the status is at [phone]. >> Dec 29, 2023 11:58 AM [name] wrote: Patient frustrated, states he has called 3 times now about his medication not being at the pharmacy. Really wants someone to call back ASAP to let him know what is going on.

## 2024-01-09 DIAGNOSIS — Z419 Encounter for procedure for purposes other than remedying health state, unspecified: Secondary | ICD-10-CM | POA: Diagnosis not present

## 2024-01-31 ENCOUNTER — Other Ambulatory Visit: Payer: Self-pay | Admitting: *Deleted

## 2024-01-31 NOTE — Patient Instructions (Addendum)
 Visit Information  [name] was given information about Medicaid Managed Care team care coordination services as a part of their [facility] Medicaid benefit. Brady Edwards verbally consented to engagement with the [facility] Managed Care team.   If you are experiencing a medical emergency, please call 911 or report to your local emergency department or urgent care.   If you have a non-emergency medical problem during routine business hours, please contact your provider's office and ask to speak with a nurse.   For questions related to your [facility] health plan, please call: [phone] or go here:[email or web address]  If you would like to schedule transportation through your [facility] plan, please call the following number at least 2 days in advance of your appointment: [phone].   You can also use the MTM portal or MTM mobile app to manage your rides. Reimbursement for transportation is available through [facility]! For the portal, please go to [email or web address]  Call the [facility] Crisis Line at [phone], at any time, 24 hours a day, 7 days a week. If you are in danger or need immediate medical attention call 911.  If you would like help to quit smoking, call 1-800-QUIT-NOW ((343)579-7345) OR Espaol: 1-855-Djelo-Ya (4-132-440-1027) o para ms informacin haga clic aqu or Text READY to 253-664 to register via text  [name] - following are the goals we discussed in your visit today:   Goals Addressed             This Visit's Progress    VBCI Social Work Care Plan LCSW       Problems:   Disease Management support and education needs related to Anxiety Agoraphobia  CSW Clinical Goal(s):   Over the next 90 days the Patient will demonstrate a reduction in symptoms related to Anxiety with Agoraphobia,  Interventions:  Mental Health:  Evaluation of current treatment plan related to Anxiety with Agoraphobia, Confirmed history of trauma  -patient agreeable to ongoing mental health support to manage symptoms  Active listening / Reflection utilized Behavioral Activation reviewed Discussed referral options to connect for ongoing therapy: resources to be emailed to [email or web address] Emotional Support Provided Motivational Interviewing employed PHQ2/PHQ9 completed GAD 7 completed Confirmed plan to follow up with the [facility] for assistance with applying for social security(contact information emailed to patient) In-network list for dentist to be emailed to patient for review  Patient Goals/Self-Care Activities:  Patient to follow up on resources provided and will schedule initial appointments when indicated  Plan:   Telephone follow up appointment with care management team member scheduled for:  02/16/24        Please see education materials related to mental health resources, disabilities advocacy center, dental health provided by email  Patient verbalizes understanding of instructions and care plan provided today and agrees to view in MyChart. Active MyChart status and patient understanding of how to access instructions and care plan via MyChart confirmed with patient.     Licensed Clinical Social Worker will follow up on on 02/16/24  Brady Priego, LCSW   [facility], [facility] Licensed Clinical Social Worker Care Coordinator  Direct Dial: [phone]     Following is a copy of your plan of care:  There are no care plans that you recently modified to display for this patient.

## 2024-02-09 DIAGNOSIS — Z419 Encounter for procedure for purposes other than remedying health state, unspecified: Secondary | ICD-10-CM | POA: Diagnosis not present

## 2024-02-16 ENCOUNTER — Other Ambulatory Visit: Payer: Self-pay | Admitting: *Deleted

## 2024-02-16 NOTE — Patient Outreach (Signed)
 Complex Care Management   Visit Note  02/16/2024  Name:  Brady Edwards MRN: [id number] DOB: 02-Jul-1966  Situation: Referral received for Complex Care Management related to Mental/Behavioral Health diagnosis Anxiety I obtained verbal consent from Patient.  Visit completed with patient  on the phone  Background:   Past Medical History:  Diagnosis Date   Atrial fibrillation (HCC)    Depression    Hypertension     Assessment: Patient Reported Symptoms:  Cognitive Cognitive Status: Alert and oriented to person, place, and time, Insightful and able to interpret abstract concepts, Normal speech and language skills      Neurological Neurological Review of Symptoms: Dizziness (occasional)    HEENT HEENT Symptoms Reported: No symptoms reported      Cardiovascular Cardiovascular Symptoms Reported: No symptoms reported Does patient have uncontrolled Hypertension?: No Cardiovascular Conditions: Hypertension Cardiovascular Management Strategies: Medication therapy  Respiratory Respiratory Symptoms Reported: No symptoms reported    Endocrine Patient reports the following symptoms related to hypoglycemia or hyperglycemia : No symptoms reported    Gastrointestinal Gastrointestinal Symptoms Reported: No symptoms reported      Genitourinary Genitourinary Symptoms Reported: No symptoms reported    Integumentary Integumentary Symptoms Reported: No symptoms reported    Musculoskeletal Musculoskelatal Symptoms Reviewed: No symptoms reported        Psychosocial Psychosocial Symptoms Reported: Depression - if selected complete PHQ 2-9, Anxiety - if selected complete GAD Additional Psychological Details: PHQ2-9 previously completed as well as GAD-patient continues to exhibit anxiety symptoms and remains agreeable to ongoing mental health counseling-mental health resources provided by the email Behavioral Health Conditions: Anxiety, Depression Major Change/Loss/Stressor/Fears (CP):  Resources, School or job Techniques to [employer] with Loss/Stress/Change: Diversional activities, Withdraw Quality of Family Relationships: non-existent Do you feel physically threatened by others?: No      01/31/2024   11:25 AM  Depression screen PHQ 2/9  Decreased Interest 3  Down, Depressed, Hopeless 2  PHQ - 2 Score 5  Altered sleeping 1  Tired, decreased energy 3  Change in appetite 1  Feeling bad or failure about yourself  2  Trouble concentrating 0  Moving slowly or fidgety/restless 0  Suicidal thoughts 1  PHQ-9 Score 13    There were no vitals filed for this visit.  Medications Reviewed Today   Medications were not reviewed in this encounter     Recommendation:   PCP Follow-up Please review mental health provider as well as in-network providers for dentist to review  Follow Up Plan:   Telephone follow-up 03/01/24  Michaelle Adolphus, LCSW [place]  [facility], [facility] Licensed Clinical Social Worker  Direct Dial: [phone]

## 2024-02-16 NOTE — Patient Outreach (Signed)
 Complex Care Management   Visit Note  02/16/2024  Name:  Brady Edwards MRN: [id number] DOB: [birth year]  Situation: Referral received for Complex Care Management related to Mental/Behavioral Health diagnosis Anxiety I obtained verbal consent from Patient.  Visit completed with patient  on the phone 01/31/24  Background:   Past Medical History:  Diagnosis Date   Atrial fibrillation (HCC)    Depression    Hypertension     Assessment: Patient Reported Symptoms:  Cognitive Cognitive Status: Alert and oriented to person, place, and time, Normal speech and language skills, Insightful and able to interpret abstract concepts Cognitive/Intellectual Conditions Management [RPT]: None reported or documented in medical history or problem list      Neurological Neurological Review of Symptoms: Dizziness Neurological Comment: gets light headed easily  HEENT HEENT Symptoms Reported: No symptoms reported      Cardiovascular Cardiovascular Symptoms Reported: Chest pain or discomfort Does patient have uncontrolled Hypertension?: No Cardiovascular Conditions: Hypertension Cardiovascular Management Strategies: Medication therapy  Respiratory Respiratory Symptoms Reported: No symptoms reported    Endocrine Is patient diabetic?: No    Gastrointestinal Gastrointestinal Symptoms Reported: No symptoms reported      Genitourinary Genitourinary Symptoms Reported: Incontinence    Integumentary Integumentary Symptoms Reported: No symptoms reported    Musculoskeletal Musculoskelatal Symptoms Reviewed: Difficulty walking Additional Musculoskeletal Details: occasionally knees give out   Falls in the past year?: Yes Number of falls in past year: 1 or less Was there an injury with Fall?: No Fall Risk Category Calculator: 1 Patient Fall Risk Level: Low Fall Risk Patient at Risk for Falls Due to: History of fall(s)  Psychosocial       Quality of Family Relationships: non-existent Do you feel  physically threatened by others?: No      01/31/2024   11:25 AM  Depression screen PHQ 2/9  Decreased Interest 3  Down, Depressed, Hopeless 2  PHQ - 2 Score 5  Altered sleeping 1  Tired, decreased energy 3  Change in appetite 1  Feeling bad or failure about yourself  2  Trouble concentrating 0  Moving slowly or fidgety/restless 0  Suicidal thoughts 1  PHQ-9 Score 13    There were no vitals filed for this visit.  Medications Reviewed Today     Reviewed by Ave Leisure, LCSW (Social Worker) on 01/31/24 at 1120  Med List Status: <None>   Medication Order Taking? Sig Documenting Provider Last Dose Status Informant  buPROPion  (WELLBUTRIN  XL) 300 MG 24 hr tablet [id number] Yes Take 1 tablet (300 mg total) by mouth daily. Terre Ferri P, DO Taking Active   lisinopril  (ZESTRIL ) 10 MG tablet [id number] Yes Take 1 tablet (10 mg total) by mouth daily. Terre Ferri P, DO Taking Active   metoprolol  succinate (TOPROL -XL) 50 MG 24 hr tablet [id number] Yes Take 1 tablet (50 mg total) by mouth daily. Johnson, Megan P, DO Taking Active   QUEtiapine  (SEROQUEL ) 25 MG tablet [id number] No Take 1 tablet (25 mg total) by mouth at bedtime.  Patient not taking: Reported on 01/31/2024   Solomon Dupre, DO Not Taking Active   venlafaxine  XR (EFFEXOR -XR) 150 MG 24 hr capsule [id number] Yes Take 1 capsule (150 mg total) by mouth daily with breakfast. To be taken with 75mg  for 225mg  daily total Johnson, Megan P, DO Taking Active   venlafaxine  XR (EFFEXOR -XR) 75 MG 24 hr capsule [id number] Yes To be taken with 150mg  for 225mg  daily total. OFFICE VISITNEEDED FOR ADDITIONAL REFILLS Solomon Dupre, DO Taking  Active             Recommendation:   PCP Follow-up Review mental health and dental resources once received  Follow Up Plan:   Telephone follow up appointment date/time:  02/16/24   Michaelle Adolphus, LCSW [place]  [facility], [facility] Licensed Clinical Social Worker   Direct Dial: [phone]

## 2024-02-16 NOTE — Patient Instructions (Signed)
 Visit Information  Thank you for taking time to visit with me today. Please don't hesitate to contact me if I can be of assistance to you before our next scheduled appointment.  Your next care management appointment is by telephone on 03/01/24 at 2pm  Telephone follow-up 2 weeks  Please call the care guide team at [phone] if you need to cancel, schedule, or reschedule an appointment.   Please call the Suicide and Crisis Lifeline: 988 if you are experiencing a Mental Health or Behavioral Health Crisis or need someone to talk to.  Seidy Labreck, LCSW [place]  [facility], [facility] Licensed Clinical Social Worker  Direct Dial: [phone]

## 2024-02-27 ENCOUNTER — Encounter: Payer: Self-pay | Admitting: Family Medicine

## 2024-02-27 ENCOUNTER — Ambulatory Visit (INDEPENDENT_AMBULATORY_CARE_PROVIDER_SITE_OTHER): Payer: Self-pay | Admitting: Family Medicine

## 2024-02-27 VITALS — BP 129/84 | HR 94 | Ht 70.0 in | Wt 312.6 lb

## 2024-02-27 DIAGNOSIS — N401 Enlarged prostate with lower urinary tract symptoms: Secondary | ICD-10-CM

## 2024-02-27 DIAGNOSIS — G4733 Obstructive sleep apnea (adult) (pediatric): Secondary | ICD-10-CM | POA: Diagnosis not present

## 2024-02-27 DIAGNOSIS — R3914 Feeling of incomplete bladder emptying: Secondary | ICD-10-CM | POA: Diagnosis not present

## 2024-02-27 DIAGNOSIS — I10 Essential (primary) hypertension: Secondary | ICD-10-CM

## 2024-02-27 DIAGNOSIS — E782 Mixed hyperlipidemia: Secondary | ICD-10-CM

## 2024-02-27 DIAGNOSIS — F4001 Agoraphobia with panic disorder: Secondary | ICD-10-CM

## 2024-02-27 MED ORDER — LISINOPRIL 10 MG PO TABS: 10.0000 mg | ORAL_TABLET | Freq: Every day | ORAL | 1 refills | Status: DC

## 2024-02-27 MED ORDER — VARENICLINE TARTRATE 1 MG PO TABS: 1.0000 mg | ORAL_TABLET | Freq: Two times a day (BID) | ORAL | 0 refills | Status: DC

## 2024-02-27 MED ORDER — QUETIAPINE FUMARATE 25 MG PO TABS: 25.0000 mg | ORAL_TABLET | Freq: Every day | ORAL | 1 refills | Status: DC

## 2024-02-27 MED ORDER — METOPROLOL SUCCINATE ER 50 MG PO TB24: 50.0000 mg | ORAL_TABLET | Freq: Every day | ORAL | 1 refills | Status: DC

## 2024-02-27 MED ORDER — VENLAFAXINE HCL ER 150 MG PO CP24: 150.0000 mg | ORAL_CAPSULE | Freq: Every day | ORAL | 1 refills | Status: DC

## 2024-02-27 MED ORDER — VENLAFAXINE HCL ER 75 MG PO CP24: ORAL_CAPSULE | ORAL | 1 refills | Status: DC

## 2024-02-27 MED ORDER — BUPROPION HCL ER (XL) 300 MG PO TB24: 300.0000 mg | ORAL_TABLET | Freq: Every day | ORAL | 1 refills | Status: DC

## 2024-02-27 MED ORDER — VARENICLINE TARTRATE (STARTER) 0.5 MG X 11 & 1 MG X 42 PO TBPK: ORAL_TABLET | ORAL | 0 refills | Status: DC

## 2024-02-27 NOTE — Progress Notes (Signed)
 BP 129/84 (BP Location: Left Arm, Patient Position: Sitting, Cuff Size: Large)   Pulse 94   Ht 5' 10 (1.778 m)   Wt (!) 312 lb 9.6 oz (141.8 kg)   SpO2 95%   BMI 44.85 kg/m    Subjective:    Patient ID: Brady Edwards, male    DOB: Dec 10, 1965, 58 y.o.   MRN: [id number]  HPI: Brady Edwards is a 58 y.o. male  Chief Complaint  Patient presents with   Hypertension   Anxiety   HYPERTENSION / HYPERLIPIDEMIA Satisfied with current treatment? yes Duration of hypertension: chronic BP monitoring frequency: not checking BP medication side effects: no Past BP meds: metoprolol , lisinopril  Duration of hyperlipidemia: chronic Cholesterol medication side effects: no Cholesterol supplements: none Past cholesterol medications: none Medication compliance: excellent compliance Aspirin: no Recent stressors: yes Recurrent headaches: no Visual changes: no Palpitations: no Dyspnea: no Chest pain: no Lower extremity edema: no Dizzy/lightheaded: no  ANXIETY/DEPRESSION Duration: chronic Status:uncontrolled Anxious mood: yes  Excessive worrying: yes Irritability: yes  Sweating: no Nausea: no Palpitations:no Hyperventilation: no Panic attacks: yes Agoraphobia: yes  Obscessions/compulsions: no Depressed mood: yes    02/27/2024    3:26 PM 01/31/2024   11:25 AM 12/27/2022   10:55 AM 04/14/2022    9:41 AM 08/14/2021    4:26 PM  Depression screen PHQ 2/9  Decreased Interest 3 3 1 2 3   Down, Depressed, Hopeless 2 2 1 2 3   PHQ - 2 Score 5 5 2 4 6   Altered sleeping 2 1 1  0 2  Tired, decreased energy 2 3 2 1  0  Change in appetite 1 1 1 1  0  Feeling bad or failure about yourself  2 2 1 1 3   Trouble concentrating 1 0 1 1 2   Moving slowly or fidgety/restless 0 0 0 0 0  Suicidal thoughts 1 1 0 0 0  PHQ-9 Score 14 13 8 8 13   Difficult doing work/chores Very difficult  Somewhat difficult Somewhat difficult    Anhedonia: yes Weight changes: no Insomnia: yes   Hypersomnia:  yes Fatigue/loss of energy: yes Feelings of worthlessness: yes Feelings of guilt: yes Impaired concentration/indecisiveness: yes Suicidal ideations: yes  Crying spells: no Recent Stressors/Life Changes: yes   Relationship problems: no   Family stress: yes     Financial stress: yes    Job stress: yes    Recent death/loss: no  SLEEP APNEA- had CPAP years ago, but hasn't had it due to cost Sleep apnea status: controlled Duration: >5 years Satisfied with current treatment?:  no CPAP use:  no Sleep quality with CPAP use: unsure Treament compliance:poor compliance Last sleep study: May 2019 Treatments attempted: CPAP Wakes feeling refreshed:  no Daytime hypersomnolence:  yes Fatigue:  yes Insomnia:  yes Good sleep hygiene:  yes Difficulty falling asleep:  no Difficulty staying asleep:  no Snoring bothers bed partner:  yes Observed apnea by bed partner: yes Obesity:  yes Hypertension: yes  Pulmonary hypertension:  no Coronary artery disease:  no   Relevant past medical, surgical, family and social history reviewed and updated as indicated. Interim medical history since our last visit reviewed. Allergies and medications reviewed and updated.  Review of Systems  Constitutional: Negative.   Respiratory: Negative.    Cardiovascular: Negative.   Musculoskeletal: Negative.   Neurological: Negative.   Psychiatric/Behavioral: Negative.      Per HPI unless specifically indicated above     Objective:    BP 129/84 (BP Location: Left Arm,  Patient Position: Sitting, Cuff Size: Large)   Pulse 94   Ht 5' 10 (1.778 m)   Wt (!) 312 lb 9.6 oz (141.8 kg)   SpO2 95%   BMI 44.85 kg/m   Wt Readings from Last 3 Encounters:  02/27/24 (!) 312 lb 9.6 oz (141.8 kg)  10/28/23 (!) 312 lb 6.4 oz (141.7 kg)  12/27/22 282 lb 4.8 oz (128.1 kg)    Physical Exam Vitals and nursing note reviewed.  Constitutional:      General: He is not in acute distress.    Appearance: Normal  appearance. He is obese. He is not ill-appearing, toxic-appearing or diaphoretic.  HENT:     Head: Normocephalic and atraumatic.     Right Ear: External ear normal.     Left Ear: External ear normal.     Nose: Nose normal.     Mouth/Throat:     Mouth: Mucous membranes are moist.     Pharynx: Oropharynx is clear.   Eyes:     General: No scleral icterus.       Right eye: No discharge.        Left eye: No discharge.     Extraocular Movements: Extraocular movements intact.     Conjunctiva/sclera: Conjunctivae normal.     Pupils: Pupils are equal, round, and reactive to light.    Cardiovascular:     Rate and Rhythm: Normal rate and regular rhythm.     Pulses: Normal pulses.     Heart sounds: Normal heart sounds. No murmur heard.    No friction rub. No gallop.  Pulmonary:     Effort: Pulmonary effort is normal. No respiratory distress.     Breath sounds: Normal breath sounds. No stridor. No wheezing, rhonchi or rales.  Chest:     Chest wall: No tenderness.   Musculoskeletal:        General: Normal range of motion.     Cervical back: Normal range of motion and neck supple.   Skin:    General: Skin is warm and dry.     Capillary Refill: Capillary refill takes less than 2 seconds.     Coloration: Skin is not jaundiced or pale.     Findings: No bruising, erythema, lesion or rash.   Neurological:     General: No focal deficit present.     Mental Status: He is alert and oriented to person, place, and time. Mental status is at baseline.   Psychiatric:        Mood and Affect: Mood normal.        Behavior: Behavior normal.        Thought Content: Thought content normal.        Judgment: Judgment normal.     Results for orders placed or performed in visit on 11/23/22  Microalbumin, Urine Waived   Collection Time: 11/23/22 10:45 AM  Result Value Ref Range   Microalb, Ur Waived 150 (H) 0 - 19 mg/L   Creatinine, Urine Waived 50 10 - 300 mg/dL   Microalb/Creat Ratio >300 (H) <30  mg/g  Comprehensive metabolic panel   Collection Time: 11/23/22 10:46 AM  Result Value Ref Range   Glucose 110 (H) 70 - 99 mg/dL   BUN 33 (H) 6 - 24 mg/dL   Creatinine, Ser 8.15 (H) 0.76 - 1.27 mg/dL   eGFR 42 (L) >40 fO/fpw/8.26   BUN/Creatinine Ratio 18 9 - 20   Sodium 141 134 - 144 mmol/L   Potassium 4.7 3.5 -  5.2 mmol/L   Chloride 106 96 - 106 mmol/L   CO2 18 (L) 20 - 29 mmol/L   Calcium 8.9 8.7 - 10.2 mg/dL   Total Protein 6.7 6.0 - 8.5 g/dL   Albumin 4.0 3.8 - 4.9 g/dL   Globulin, Total 2.7 1.5 - 4.5 g/dL   Albumin/Globulin Ratio 1.5 1.2 - 2.2   Bilirubin Total 0.3 0.0 - 1.2 mg/dL   Alkaline Phosphatase 122 (H) 44 - 121 IU/L   AST 22 0 - 40 IU/L   ALT 28 0 - 44 IU/L  Lipid Panel w/o Chol/HDL Ratio   Collection Time: 11/23/22 10:46 AM  Result Value Ref Range   Cholesterol, Total 234 (H) 100 - 199 mg/dL   Triglycerides 642 (H) 0 - 149 mg/dL   HDL 34 (L) >60 mg/dL   VLDL Cholesterol Cal 65 (H) 5 - 40 mg/dL   LDL Chol Calc (NIH) 864 (H) 0 - 99 mg/dL  PSA   Collection Time: 11/23/22 10:46 AM  Result Value Ref Range   Prostate Specific Ag, Serum 1.0 0.0 - 4.0 ng/mL      Assessment & Plan:   Problem List Items Addressed This Visit       Cardiovascular and Mediastinum   Hypertension   Under good control on current regimen. Continue current regimen. Continue to monitor. Call with any concerns. Refills given. Will recheck labs at physical in 4-6 weeks.        Relevant Medications   lisinopril  (ZESTRIL ) 10 MG tablet   metoprolol  succinate (TOPROL -XL) 50 MG 24 hr tablet     Respiratory   OSA (obstructive sleep apnea)   Has not been on CPAP. Will obtain records and order new CPAP.         Genitourinary   BPH (benign prostatic hyperplasia)   Will recheck labs at physical in 4-6 weeks.         Other   Panic disorder with agoraphobia - Primary   Not doing great, but stable. Continue current regimen. Continue to monitor. Call with any concerns.       Relevant  Medications   buPROPion  (WELLBUTRIN  XL) 300 MG 24 hr tablet   venlafaxine  XR (EFFEXOR -XR) 150 MG 24 hr capsule   venlafaxine  XR (EFFEXOR -XR) 75 MG 24 hr capsule   Hyperlipidemia   Will recheck labs at physical in 4-6 weeks.       Relevant Medications   lisinopril  (ZESTRIL ) 10 MG tablet   metoprolol  succinate (TOPROL -XL) 50 MG 24 hr tablet     Follow up plan: Return 4-6 weeks, for physical.

## 2024-02-27 NOTE — Assessment & Plan Note (Signed)
 Not doing great, but stable. Continue current regimen. Continue to monitor. Call with any concerns.

## 2024-02-27 NOTE — Assessment & Plan Note (Signed)
 Will recheck labs at physical in 4-6 weeks.

## 2024-02-27 NOTE — Assessment & Plan Note (Signed)
 Under good control on current regimen. Continue current regimen. Continue to monitor. Call with any concerns. Refills given. Will recheck labs at physical in 4-6 weeks.

## 2024-02-27 NOTE — Assessment & Plan Note (Signed)
 Has not been on CPAP. Will obtain records and order new CPAP.

## 2024-03-01 ENCOUNTER — Encounter: Payer: Self-pay | Admitting: *Deleted

## 2024-03-01 ENCOUNTER — Telehealth: Payer: Self-pay | Admitting: *Deleted

## 2024-03-05 ENCOUNTER — Telehealth: Payer: Self-pay

## 2024-03-05 NOTE — Telephone Encounter (Signed)
 Pharmacy Patient Advocate Encounter   Received notification from CoverMyMeds that prior authorization for QUEtiapine  Fumarate 25MG  tablets  is required/requested.   Insurance verification completed.   The patient is insured through [facility] .   Per test claim: PA required; PA submitted to above mentioned insurance via CoverMyMeds Key/confirmation #/EOC [id number] Status is pending

## 2024-03-05 NOTE — Telephone Encounter (Signed)
 Pharmacy Patient Advocate Encounter  Received notification from [facility] that Prior Authorization for Quetiapine  fumarate has been APPROVED from 03/05/2024 to 03/05/2025   PA #/Case ID/Reference #: [id number]

## 2024-03-10 DIAGNOSIS — Z419 Encounter for procedure for purposes other than remedying health state, unspecified: Secondary | ICD-10-CM | POA: Diagnosis not present

## 2024-03-19 ENCOUNTER — Other Ambulatory Visit: Payer: Self-pay | Admitting: *Deleted

## 2024-03-19 NOTE — Patient Instructions (Signed)
 Visit Information  Thank you for taking time to visit with me today. Please don't hesitate to contact me if I can be of assistance to you before our next scheduled appointment.  Your next care management appointment is by telephone on 04/12/24 at 2pm  Telephone follow-up 04/12/24  Please call the care guide team at [phone] if you need to cancel, schedule, or reschedule an appointment.   Please call the Suicide and Crisis Lifeline: 988 if you are experiencing a Mental Health or Behavioral Health Crisis or need someone to talk to.  Joanny Dupree, LCSW [place]  [facility], [facility] Licensed Clinical Social Worker  Direct Dial: [phone]

## 2024-03-19 NOTE — Patient Outreach (Addendum)
 Complex Care Management   Visit Note  03/19/2024  Name:  Brady Edwards MRN: [id number] DOB: [birth year]  Situation: Referral received for Complex Care Management related to Mental/Behavioral Health diagnosis Anxiety I obtained verbal consent from Patient.  Visit completed with patient  on the phone    Background:   Past Medical History:  Diagnosis Date   Atrial fibrillation (HCC)    Depression    Hypertension     Assessment: Patient Reported Symptoms:  Cognitive Cognitive Status: Alert and oriented to person, place, and time, Insightful and able to interpret abstract concepts, Normal speech and language skills Cognitive/Intellectual Conditions Management [RPT]: None reported or documented in medical history or problem list   Health Maintenance Behaviors: Annual physical exam Healing Pattern: Slow Health Facilitated by: Stress management  Neurological Neurological Review of Symptoms: Dizziness (occasional) Neurological Comment: appt with pcp on 03/28/24  HEENT HEENT Symptoms Reported: No symptoms reported      Cardiovascular Cardiovascular Symptoms Reported: No symptoms reported    Respiratory Respiratory Symptoms Reported: No symptoms reported    Endocrine Endocrine Symptoms Reported: No symptoms reported    Gastrointestinal Gastrointestinal Symptoms Reported: No symptoms reported      Genitourinary Genitourinary Symptoms Reported: No symptoms reported    Integumentary Integumentary Symptoms Reported: No symptoms reported    Musculoskeletal Musculoskelatal Symptoms Reviewed: No symptoms reported        Psychosocial Psychosocial Symptoms Reported: Depression - if selected complete PHQ 2-9, Anxiety - if selected complete GAD Additional Psychological Details: PHQ2-9 and GAD  previously completed-continued anxiety-remains agreeable to ongoing mental health counseling-referral completed for [employer] Management Strategies: Counseling, Medication  therapy Major Change/Loss/Stressor/Fears (CP): Resources, School or job Techniques to [employer] with Loss/Stress/Change: Diversional activities, Withdraw Quality of Family Relationships: non-existent Do you feel physically threatened by others?: No      02/27/2024    3:26 PM  Depression screen PHQ 2/9  Decreased Interest 3  Down, Depressed, Hopeless 2  PHQ - 2 Score 5  Altered sleeping 2  Tired, decreased energy 2  Change in appetite 1  Feeling bad or failure about yourself  2  Trouble concentrating 1  Moving slowly or fidgety/restless 0  Suicidal thoughts 1  PHQ-9 Score 14  Difficult doing work/chores Very difficult    There were no vitals filed for this visit.  Medications Reviewed Today   Medications were not reviewed in this encounter     Recommendation:   PCP Follow-up Ongoing mental health counseling-referral completed for [facility] Review list of Dentist provided by email for follow up   Follow Up Plan:   Telephone follow-up 04/22/24  Lenn Mean, LCSW [place]  [facility], [facility] Licensed Clinical Social Worker  Direct Dial: [phone]

## 2024-03-28 ENCOUNTER — Encounter: Admitting: Family Medicine

## 2024-04-07 ENCOUNTER — Other Ambulatory Visit: Payer: Self-pay

## 2024-04-07 ENCOUNTER — Emergency Department

## 2024-04-07 ENCOUNTER — Inpatient Hospital Stay
Admission: EM | Admit: 2024-04-07 | Discharge: 2024-04-13 | DRG: 674 | Disposition: A | Discharge status: Home / Self Care | Attending: Internal Medicine | Admitting: Internal Medicine

## 2024-04-07 ENCOUNTER — Inpatient Hospital Stay

## 2024-04-07 DIAGNOSIS — R0602 Shortness of breath: Secondary | ICD-10-CM | POA: Diagnosis not present

## 2024-04-07 DIAGNOSIS — F3289 Other specified depressive episodes: Secondary | ICD-10-CM | POA: Diagnosis not present

## 2024-04-07 DIAGNOSIS — N400 Enlarged prostate without lower urinary tract symptoms: Secondary | ICD-10-CM | POA: Diagnosis present

## 2024-04-07 DIAGNOSIS — E66813 Obesity, class 3: Secondary | ICD-10-CM | POA: Diagnosis not present

## 2024-04-07 DIAGNOSIS — Z9889 Other specified postprocedural states: Secondary | ICD-10-CM | POA: Diagnosis not present

## 2024-04-07 DIAGNOSIS — N401 Enlarged prostate with lower urinary tract symptoms: Secondary | ICD-10-CM | POA: Diagnosis not present

## 2024-04-07 DIAGNOSIS — N186 End stage renal disease: Secondary | ICD-10-CM | POA: Diagnosis not present

## 2024-04-07 DIAGNOSIS — D638 Anemia in other chronic diseases classified elsewhere: Secondary | ICD-10-CM | POA: Diagnosis not present

## 2024-04-07 DIAGNOSIS — F439 Reaction to severe stress, unspecified: Secondary | ICD-10-CM

## 2024-04-07 DIAGNOSIS — K5900 Constipation, unspecified: Secondary | ICD-10-CM | POA: Diagnosis present

## 2024-04-07 DIAGNOSIS — Z4901 Encounter for fitting and adjustment of extracorporeal dialysis catheter: Secondary | ICD-10-CM | POA: Diagnosis not present

## 2024-04-07 DIAGNOSIS — Z6841 Body Mass Index (BMI) 40.0 and over, adult: Secondary | ICD-10-CM | POA: Diagnosis not present

## 2024-04-07 DIAGNOSIS — F32A Depression, unspecified: Secondary | ICD-10-CM | POA: Diagnosis not present

## 2024-04-07 DIAGNOSIS — Z79899 Other long term (current) drug therapy: Secondary | ICD-10-CM | POA: Diagnosis not present

## 2024-04-07 DIAGNOSIS — Z833 Family history of diabetes mellitus: Secondary | ICD-10-CM

## 2024-04-07 DIAGNOSIS — Z1152 Encounter for screening for COVID-19: Secondary | ICD-10-CM

## 2024-04-07 DIAGNOSIS — R531 Weakness: Secondary | ICD-10-CM | POA: Diagnosis not present

## 2024-04-07 DIAGNOSIS — E872 Acidosis, unspecified: Secondary | ICD-10-CM | POA: Diagnosis not present

## 2024-04-07 DIAGNOSIS — F17210 Nicotine dependence, cigarettes, uncomplicated: Secondary | ICD-10-CM | POA: Diagnosis present

## 2024-04-07 DIAGNOSIS — Z8249 Family history of ischemic heart disease and other diseases of the circulatory system: Secondary | ICD-10-CM

## 2024-04-07 DIAGNOSIS — N281 Cyst of kidney, acquired: Secondary | ICD-10-CM | POA: Diagnosis not present

## 2024-04-07 DIAGNOSIS — F419 Anxiety disorder, unspecified: Secondary | ICD-10-CM | POA: Diagnosis present

## 2024-04-07 DIAGNOSIS — I120 Hypertensive chronic kidney disease with stage 5 chronic kidney disease or end stage renal disease: Secondary | ICD-10-CM | POA: Diagnosis present

## 2024-04-07 DIAGNOSIS — Z720 Tobacco use: Secondary | ICD-10-CM | POA: Diagnosis present

## 2024-04-07 DIAGNOSIS — E875 Hyperkalemia: Secondary | ICD-10-CM

## 2024-04-07 DIAGNOSIS — E782 Mixed hyperlipidemia: Secondary | ICD-10-CM | POA: Diagnosis not present

## 2024-04-07 DIAGNOSIS — I10 Essential (primary) hypertension: Secondary | ICD-10-CM | POA: Diagnosis present

## 2024-04-07 DIAGNOSIS — Z5912 Inadequate housing utilities: Secondary | ICD-10-CM

## 2024-04-07 DIAGNOSIS — Z8679 Personal history of other diseases of the circulatory system: Secondary | ICD-10-CM | POA: Diagnosis not present

## 2024-04-07 DIAGNOSIS — R5383 Other fatigue: Secondary | ICD-10-CM | POA: Diagnosis not present

## 2024-04-07 DIAGNOSIS — G4733 Obstructive sleep apnea (adult) (pediatric): Secondary | ICD-10-CM | POA: Diagnosis present

## 2024-04-07 DIAGNOSIS — F4001 Agoraphobia with panic disorder: Secondary | ICD-10-CM | POA: Diagnosis present

## 2024-04-07 DIAGNOSIS — Z5982 Transportation insecurity: Secondary | ICD-10-CM

## 2024-04-07 DIAGNOSIS — R392 Extrarenal uremia: Secondary | ICD-10-CM | POA: Diagnosis not present

## 2024-04-07 DIAGNOSIS — I482 Chronic atrial fibrillation, unspecified: Secondary | ICD-10-CM | POA: Diagnosis not present

## 2024-04-07 DIAGNOSIS — Z5941 Food insecurity: Secondary | ICD-10-CM

## 2024-04-07 DIAGNOSIS — T8249XA Other complication of vascular dialysis catheter, initial encounter: Secondary | ICD-10-CM | POA: Diagnosis not present

## 2024-04-07 DIAGNOSIS — E669 Obesity, unspecified: Secondary | ICD-10-CM | POA: Diagnosis not present

## 2024-04-07 DIAGNOSIS — N19 Unspecified kidney failure: Principal | ICD-10-CM

## 2024-04-07 DIAGNOSIS — N2581 Secondary hyperparathyroidism of renal origin: Secondary | ICD-10-CM | POA: Diagnosis present

## 2024-04-07 DIAGNOSIS — D631 Anemia in chronic kidney disease: Secondary | ICD-10-CM | POA: Diagnosis present

## 2024-04-07 DIAGNOSIS — E8721 Acute metabolic acidosis: Secondary | ICD-10-CM | POA: Diagnosis present

## 2024-04-07 DIAGNOSIS — E785 Hyperlipidemia, unspecified: Secondary | ICD-10-CM | POA: Diagnosis present

## 2024-04-07 DIAGNOSIS — N185 Chronic kidney disease, stage 5: Secondary | ICD-10-CM

## 2024-04-07 DIAGNOSIS — I951 Orthostatic hypotension: Secondary | ICD-10-CM | POA: Diagnosis not present

## 2024-04-07 DIAGNOSIS — E559 Vitamin D deficiency, unspecified: Secondary | ICD-10-CM | POA: Diagnosis not present

## 2024-04-07 DIAGNOSIS — R011 Cardiac murmur, unspecified: Secondary | ICD-10-CM | POA: Diagnosis present

## 2024-04-07 DIAGNOSIS — N179 Acute kidney failure, unspecified: Secondary | ICD-10-CM | POA: Diagnosis not present

## 2024-04-07 DIAGNOSIS — T8249XS Other complication of vascular dialysis catheter, sequela: Secondary | ICD-10-CM | POA: Diagnosis not present

## 2024-04-07 DIAGNOSIS — R630 Anorexia: Secondary | ICD-10-CM | POA: Diagnosis not present

## 2024-04-07 DIAGNOSIS — Z560 Unemployment, unspecified: Secondary | ICD-10-CM

## 2024-04-07 LAB — BASIC METABOLIC PANEL WITH GFR
Anion gap: 18 — ABNORMAL HIGH (ref 5–15)
Anion gap: 17 — ABNORMAL HIGH (ref 5–15)
Anion gap: 15 (ref 5–15)
BUN: 148 mg/dL — ABNORMAL HIGH (ref 6–20)
BUN: 143 mg/dL — ABNORMAL HIGH (ref 6–20)
BUN: 143 mg/dL — ABNORMAL HIGH (ref 6–20)
CO2: 9 mmol/L — ABNORMAL LOW (ref 22–32)
CO2: 10 mmol/L — ABNORMAL LOW (ref 22–32)
CO2: 10 mmol/L — ABNORMAL LOW (ref 22–32)
Calcium: 6.7 mg/dL — ABNORMAL LOW (ref 8.9–10.3)
Calcium: 6.6 mg/dL — ABNORMAL LOW (ref 8.9–10.3)
Calcium: 6.3 mg/dL — CL (ref 8.9–10.3)
Chloride: 111 mmol/L (ref 98–111)
Chloride: 111 mmol/L (ref 98–111)
Chloride: 112 mmol/L — ABNORMAL HIGH (ref 98–111)
Creatinine, Ser: 22.29 mg/dL — ABNORMAL HIGH (ref 0.61–1.24)
Creatinine, Ser: 21.09 mg/dL — ABNORMAL HIGH (ref 0.61–1.24)
Creatinine, Ser: 19.9 mg/dL — ABNORMAL HIGH (ref 0.61–1.24)
GFR, Estimated: 2 mL/min — ABNORMAL LOW (ref >60)
GFR, Estimated: 2 mL/min — ABNORMAL LOW (ref >60)
GFR, Estimated: 2 mL/min — ABNORMAL LOW (ref >60)
Glucose, Bld: 139 mg/dL — ABNORMAL HIGH (ref 70–99)
Glucose, Bld: 103 mg/dL — ABNORMAL HIGH (ref 70–99)
Glucose, Bld: 109 mg/dL — ABNORMAL HIGH (ref 70–99)
Potassium: 6.8 mmol/L (ref 3.5–5.1)
Potassium: 6.3 mmol/L (ref 3.5–5.1)
Potassium: 4.8 mmol/L (ref 3.5–5.1)
Sodium: 138 mmol/L (ref 135–145)
Sodium: 138 mmol/L (ref 135–145)
Sodium: 137 mmol/L (ref 135–145)

## 2024-04-07 LAB — CBC
HCT: 38.4 % — ABNORMAL LOW (ref 39.0–52.0)
Hemoglobin: 12.5 g/dL — ABNORMAL LOW (ref 13.0–17.0)
MCH: 30.9 pg (ref 26.0–34.0)
MCHC: 32.6 g/dL (ref 30.0–36.0)
MCV: 94.8 fL (ref 80.0–100.0)
Platelets: 253 K/uL (ref 150–400)
RBC: 4.05 MIL/uL — ABNORMAL LOW (ref 4.22–5.81)
RDW: 14.2 % (ref 11.5–15.5)
WBC: 10.7 K/uL — ABNORMAL HIGH (ref 4.0–10.5)
nRBC: 0 % (ref 0.0–0.2)

## 2024-04-07 LAB — BLOOD GAS, VENOUS
Acid-base deficit: 18.3 mmol/L — ABNORMAL HIGH (ref 0.0–2.0)
Bicarbonate: 10.6 mmol/L — ABNORMAL LOW (ref 20.0–28.0)
O2 Saturation: 89.6 %
Patient temperature: 37
pCO2, Ven: 35 mmHg — ABNORMAL LOW (ref 44–60)
pH, Ven: 7.09 — CL (ref 7.25–7.43)
pO2, Ven: 65 mmHg — ABNORMAL HIGH (ref 32–45)

## 2024-04-07 LAB — HEPATIC FUNCTION PANEL
ALT: 23 U/L (ref 0–44)
AST: 16 U/L (ref 15–41)
Albumin: 3.1 g/dL — ABNORMAL LOW (ref 3.5–5.0)
Alkaline Phosphatase: 76 U/L (ref 38–126)
Bilirubin, Direct: <0.1 mg/dL (ref 0.0–0.2)
Total Bilirubin: 0.6 mg/dL (ref 0.0–1.2)
Total Protein: 6.1 g/dL — ABNORMAL LOW (ref 6.5–8.1)

## 2024-04-07 LAB — BRAIN NATRIURETIC PEPTIDE: B Natriuretic Peptide: 12.9 pg/mL (ref 0.0–100.0)

## 2024-04-07 LAB — RESP PANEL BY RT-PCR (RSV, FLU A&B, COVID)  RVPGX2
Influenza A by PCR: NEGATIVE
Influenza B by PCR: NEGATIVE
Resp Syncytial Virus by PCR: NEGATIVE
SARS Coronavirus 2 by RT PCR: NEGATIVE

## 2024-04-07 LAB — CK: Total CK: 409 U/L — ABNORMAL HIGH (ref 49–397)

## 2024-04-07 LAB — TROPONIN I (HIGH SENSITIVITY)
Troponin I (High Sensitivity): 21 ng/L — ABNORMAL HIGH (ref <18)
Troponin I (High Sensitivity): 23 ng/L — ABNORMAL HIGH (ref <18)

## 2024-04-07 LAB — ALBUMIN: Albumin: 2.7 g/dL — ABNORMAL LOW (ref 3.5–5.0)

## 2024-04-07 LAB — LIPASE, BLOOD: Lipase: 71 U/L — ABNORMAL HIGH (ref 11–51)

## 2024-04-07 LAB — MAGNESIUM: Magnesium: 2.4 mg/dL (ref 1.7–2.4)

## 2024-04-07 LAB — D-DIMER, QUANTITATIVE: D-Dimer, Quant: 0.8 ug{FEU}/mL — ABNORMAL HIGH (ref 0.00–0.50)

## 2024-04-07 LAB — TSH: TSH: 2.348 u[IU]/mL (ref 0.350–4.500)

## 2024-04-07 MED ORDER — QUETIAPINE FUMARATE 25 MG PO TABS
25.0000 mg | ORAL_TABLET | Freq: Every day | ORAL | Status: DC
  Administered 2024-04-08 – 2024-04-12 (×8): 25 mg via ORAL
  Filled 2024-04-07 (×5): qty 1

## 2024-04-07 MED ORDER — BUPROPION HCL ER (XL) 150 MG PO TB24
300.0000 mg | ORAL_TABLET | Freq: Every day | ORAL | Status: DC
  Administered 2024-04-08 – 2024-04-12 (×8): 300 mg via ORAL
  Filled 2024-04-07 (×5): qty 2

## 2024-04-07 MED ORDER — ONDANSETRON HCL 4 MG/2ML IJ SOLN
4.0000 mg | Freq: Four times a day (QID) | INTRAMUSCULAR | Status: AC | PRN
  Administered 2024-04-10 (×2): 4 mg via INTRAVENOUS
  Filled 2024-04-07: qty 2

## 2024-04-07 MED ORDER — ACETAMINOPHEN 325 MG PO TABS
650.0000 mg | ORAL_TABLET | Freq: Four times a day (QID) | ORAL | Status: AC | PRN
Start: 2024-04-07 — End: 2024-04-12
  Administered 2024-04-12: 650 mg via ORAL
  Filled 2024-04-07: qty 2

## 2024-04-07 MED ORDER — SODIUM BICARBONATE 8.4 % IV SOLN
100.0000 meq | Freq: Once | INTRAVENOUS | Status: AC
  Administered 2024-04-07: 100 meq via INTRAVENOUS
  Filled 2024-04-07: qty 50

## 2024-04-07 MED ORDER — ONDANSETRON HCL 4 MG PO TABS
4.0000 mg | ORAL_TABLET | Freq: Four times a day (QID) | ORAL | Status: AC | PRN
  Filled 2024-04-07: qty 1

## 2024-04-07 MED ORDER — NICOTINE 21 MG/24HR TD PT24: 21.0000 mg | MEDICATED_PATCH | Freq: Every day | TRANSDERMAL | Status: AC | PRN

## 2024-04-07 MED ORDER — ALBUTEROL SULFATE (2.5 MG/3ML) 0.083% IN NEBU
10.0000 mg | INHALATION_SOLUTION | Freq: Once | RESPIRATORY_TRACT | Status: AC
  Administered 2024-04-07: 10 mg via RESPIRATORY_TRACT
  Filled 2024-04-07: qty 12

## 2024-04-07 MED ORDER — SODIUM CHLORIDE 0.9 % IV BOLUS
1000.0000 mL | Freq: Once | INTRAVENOUS | Status: AC
  Administered 2024-04-07: 1000 mL via INTRAVENOUS

## 2024-04-07 MED ORDER — METOPROLOL SUCCINATE ER 50 MG PO TB24
50.0000 mg | ORAL_TABLET | Freq: Every day | ORAL | Status: DC
Start: 2024-04-08 — End: 2024-04-13
  Administered 2024-04-08 – 2024-04-12 (×8): 50 mg via ORAL
  Filled 2024-04-07 (×5): qty 1

## 2024-04-07 MED ORDER — CALCIUM GLUCONATE-NACL 1-0.675 GM/50ML-% IV SOLN
1.0000 g | Freq: Once | INTRAVENOUS | Status: AC
  Administered 2024-04-07: 1000 mg via INTRAVENOUS
  Filled 2024-04-07: qty 50

## 2024-04-07 MED ORDER — METOPROLOL TARTRATE 5 MG/5ML IV SOLN: 5.0000 mg | INTRAVENOUS | Status: DC | PRN

## 2024-04-07 MED ORDER — SODIUM BICARBONATE 8.4 % IV SOLN
50.0000 meq | Freq: Once | INTRAVENOUS | Status: AC
  Administered 2024-04-07: 50 meq via INTRAVENOUS
  Filled 2024-04-07: qty 50

## 2024-04-07 MED ORDER — HEPARIN SODIUM (PORCINE) 5000 UNIT/ML IJ SOLN
5000.0000 [IU] | Freq: Three times a day (TID) | INTRAMUSCULAR | Status: DC
  Administered 2024-04-07 – 2024-04-13 (×22): 5000 [IU] via SUBCUTANEOUS
  Filled 2024-04-07 (×16): qty 1

## 2024-04-07 MED ORDER — VENLAFAXINE HCL ER 75 MG PO CP24
150.0000 mg | ORAL_CAPSULE | Freq: Every day | ORAL | Status: DC
  Administered 2024-04-08 – 2024-04-12 (×8): 150 mg via ORAL
  Filled 2024-04-07 (×2): qty 2
  Filled 2024-04-07: qty 1
  Filled 2024-04-07 (×3): qty 2

## 2024-04-07 MED ORDER — IPRATROPIUM-ALBUTEROL 0.5-2.5 (3) MG/3ML IN SOLN
3.0000 mL | Freq: Once | RESPIRATORY_TRACT | Status: AC
  Administered 2024-04-07: 3 mL via RESPIRATORY_TRACT
  Filled 2024-04-07: qty 3

## 2024-04-07 MED ORDER — HYDRALAZINE HCL 20 MG/ML IJ SOLN: 5.0000 mg | Freq: Four times a day (QID) | INTRAMUSCULAR | Status: AC | PRN

## 2024-04-07 MED ORDER — SODIUM CHLORIDE 0.9 % IV SOLN: INTRAVENOUS | Status: DC

## 2024-04-07 MED ORDER — ACETAMINOPHEN 650 MG RE SUPP: 650.0000 mg | Freq: Four times a day (QID) | RECTAL | Status: AC | PRN

## 2024-04-07 MED ORDER — STERILE WATER FOR INJECTION IV SOLN
INTRAVENOUS | Status: DC
  Filled 2024-04-07: qty 150
  Filled 2024-04-07 (×2): qty 1000
  Filled 2024-04-07: qty 150
  Filled 2024-04-07: qty 1000

## 2024-04-07 MED ORDER — VENLAFAXINE HCL ER 75 MG PO CP24
75.0000 mg | ORAL_CAPSULE | Freq: Every day | ORAL | Status: DC
Start: 2024-04-08 — End: 2024-04-13
  Administered 2024-04-08 – 2024-04-12 (×8): 75 mg via ORAL
  Filled 2024-04-07 (×6): qty 1

## 2024-04-07 MED ORDER — SODIUM ZIRCONIUM CYCLOSILICATE 10 G PO PACK
10.0000 g | PACK | Freq: Once | ORAL | Status: AC
  Administered 2024-04-07: 10 g via ORAL
  Filled 2024-04-07: qty 1

## 2024-04-07 MED ORDER — IPRATROPIUM-ALBUTEROL 0.5-2.5 (3) MG/3ML IN SOLN: 3.0000 mL | Freq: Four times a day (QID) | RESPIRATORY_TRACT | Status: AC | PRN

## 2024-04-07 MED ORDER — PATIROMER SORBITEX CALCIUM 8.4 G PO PACK
25.2000 g | PACK | Freq: Every day | ORAL | Status: DC
  Administered 2024-04-07: 25.2 g via ORAL
  Filled 2024-04-07 (×2): qty 3

## 2024-04-07 NOTE — Assessment & Plan Note (Signed)
 Home metoprolol  succinate 50 mg daily resume for 04/08/2024 Metoprolol  tartrate 5 mg IV every 4 hours as needed for heart rate greater than 120, 3 doses ordered Patient not taking anticoagulation

## 2024-04-07 NOTE — Hospital Course (Addendum)
 [name] is a 58 year old male with history of hypertension, atrial fibrillation on metoprolol  not on anticoagulation, BPH, hyperlipidemia, anxiety, constipation, morbid obesity, who presents emergency department for chief concerns of fatigue, and no p.o. intake for the last 5 to 6-day.  Vitals in the ED showed temperature of 98.4, respiration rate 24, heart rate initially 171, improved to 92, blood pressure initially 98/69, improved to 101/63, SpO2 of 98% on room air.  Serum sodium is 138, potassium 6.8, chloride 111, bicarb 9, BUN of 148, serum creatinine 22.29, EGFR of 2, nonfasting glucose 139, WBC 10.7, hemoglobin 12.5, platelets of 253.  BNP is 12.9.  HS troponin is 21.  ED treatment: DuoNebs, Veltassa  one-time dose, albuterol  nebulizer, Lokelma , calcium  gluconate IV one-time dose, sodium chloride  2 L bolus, sodium bicarbonate .  EDP consulted with nephrology who recommended Lokelma  and Veltassa .  EDP ordered ultrasound of the renal.  8/10: Vitals mostly stable, on room air, renal ultrasound with chronic medical renal disease and bilateral renal cysts, labs with resolution of hyperkalemia, persistent non-anion gap metabolic acidosis with bicarb at 10-received IV bicarb and started on bicarb infusion.  Small improvement in creatinine now at 19.31, persistent hypocalcemia-was given calcium  gluconate.  Parathyroid  and phosphorus levels were ordered-likely secondary to renal failure TSH was normal.  Patient is likely now ESRD and nephrology is planning to start him on dialysis from tomorrow.  8/11: Vital stable, lipid panel mostly normal with low HDL at 25, LDL of 66, persistent hypocalcemia with corrected calcium  at 7.5, significantly elevated phosphorus, small improvement in bicarb and creatinine.  Patient is going for HD catheter placement followed by starting dialysis.  8/12: Hemodynamically stable, having some nausea, had second session of dialysis today.  Ordered echocardiogram for  murmur evaluation.   8/13: Had third session of dialysis today, permanent HD catheter to be placed tomorrow followed by another dialysis on Friday.  Outpatient dialysis chair secured starting from Tuesday.  8/14: Hemodynamically stable, had his permanent HD catheter placed by vascular surgery today, will get his dialysis tomorrow morning before discharge, will start TTS schedule from Tuesday.  8/15: Remained hemodynamically stable, had his routine dialysis today and he will start his outpatient dialysis on Tuesday.  Blood pressure within goal with metoprolol  only so home lisinopril  was discontinued.  Patient need to discuss with primary care provider and cardiologist regarding anticoagulation for concern of atrial fibrillation.  Patient will continue on current medications and need to have a close follow-up with his providers for further assistance.

## 2024-04-07 NOTE — H&P (Signed)
 History and Physical   RAYJON WERY FMW:[id number] DOB: [birth year] DOA: 04/07/2024  PCP: Vicci Duwaine SQUIBB, DO  Patient coming from: Home  I have personally briefly reviewed patient's old medical records in [facility] EMR.  Chief Concern: Fatigue, no p.o. intake  HPI: Mr. Chiron Campione is a 58 year old male with history of hypertension, atrial fibrillation on metoprolol  not on anticoagulation, BPH, hyperlipidemia, anxiety, constipation, morbid obesity, who presents emergency department for chief concerns of fatigue, and no p.o. intake for the last 5 to 6-day.  Vitals in the ED showed temperature of 98.4, respiration rate 24, heart rate initially 171, improved to 92, blood pressure initially 98/69, improved to 101/63, SpO2 of 98% on room air.  Serum sodium is 138, potassium 6.8, chloride 10 11, bicarb 9, BUN of 148, serum creatinine 22.29, EGFR of 2, nonfasting glucose 139, WBC 10.7, hemoglobin 12.5, platelets of 253.  BNP is 12.9.  HS troponin is 21.  ED treatment: DuoNebs, Veltassa  one-time dose, albuterol  nebulizer, Lokelma , calcium  gluconate IV one-time dose, sodium chloride  2 L bolus, sodium bicarbonate .  EDP consulted with nephrology who recommended Lokelma  and Veltassa .  EDP ordered ultrasound of the renal. -------------------------------- At bedside, patient patient able to tell me his first and last name, his age, the current calendar year and his current location.  He reports he has been feeling fatigued for the last 4 to 5 days.  He reports he has had no appetite.  He reports he has had decreased urine output, he thinks the last time he may have urinated was 2 days ago.  He is unsure.  He denies dysuria.  He reports his urine is dark.  He denies chest pain, shortness of breath.  He reports he has had so much fatigue he has not been to work. He denies trauma to his person.  He denies nausea, vomiting, known sick contacts.  Social history: Lives at home on his own.  He endorses  tobacco use, is trying to quit.  He denies EtOH and recreational drug use.  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  ROS: Constitutional: no weight change, no fever ENT/Mouth: no sore throat, no rhinorrhea Eyes: no eye pain, no vision changes Cardiovascular: no chest pain, no dyspnea,  no edema, no palpitations Respiratory: no cough, no sputum, no wheezing Gastrointestinal: no nausea, no vomiting, no diarrhea, no constipation Genitourinary: no urinary incontinence, no dysuria, no hematuria, + decreased urine output Musculoskeletal: no arthralgias, no myalgias Skin: no skin lesions, no pruritus, Neuro: + weakness, no loss of consciousness, no syncope Psych: no anxiety, no depression, + decrease appetite Heme/Lymph: no bruising, no bleeding  ED Course: Discussed with EDP, patient requiring hospitalization for chief concerns of acute renal failure.  Assessment/Plan  Principal Problem:   Renal failure (ARF), acute on chronic (HCC) Active Problems:   Panic disorder with agoraphobia   Tobacco abuse   Stress at home   Hyperlipidemia   Hypertension   BPH (benign prostatic hyperplasia)   OSA (obstructive sleep apnea)   Obesity, Class III, BMI 40-49.9 (morbid obesity)   Depression   Atrial fibrillation, chronic (HCC)   Fatigue   Assessment and Plan:  * Renal failure (ARF), acute on chronic (HCC) Status post Veltassa  25.2 g po, Lokelma  10 g p.o., sodium bicarbonate  50 mill equivalent IV one-time dose, sodium chloride  2 L bolus, calcium  gluconate 1 g IV one-time dose, albuterol  nebulizer, duo nebulizers Recheck BMP one-time dose per EDP EDP consulted nephrology, appreciate  nephrology further recommendation guidance Sodium chloride  infusion at 150 mL/h, 1 day ordered Strict I's and O's Recheck BMP in the a.m.  Fatigue With weakness Check TSH, B12, magnesium level, CK level  Atrial fibrillation, chronic (HCC) Home metoprolol  succinate 50 mg  daily resume for 04/08/2024 Metoprolol  tartrate 5 mg IV every 4 hours as needed for heart rate greater than 120, 3 doses ordered Patient not taking anticoagulation  Depression Home venlafaxine  225 mg p.o. daily before breakfast, bupropion  300 mg daily were resumed  Obesity, Class III, BMI 40-49.9 (morbid obesity) This complicates overall care and prognosis.   OSA (obstructive sleep apnea) CPAP nightly ordered  Hypertension Home lisinopril  10 mg daily will not be resumed on admission Hydralazine  5 mg IV every 6 hours as needed for SBP greater 170, 5 days ordered  Tobacco abuse As needed nicotine  patch ordered  Chart reviewed.   DVT prophylaxis: Heparin  5000 units subcutaneous every 8 hours Code Status: Full code Diet: Renal diet Family Communication: No Disposition Plan: Pending clinical course Consults called: Nephrology via EDP and via Epic ordered by myself Admission status: Inpatient, PCU  Past Medical History:  Diagnosis Date   Atrial fibrillation (HCC)    Depression    Hypertension    History reviewed. No pertinent surgical history.  Social History:  reports that he has been smoking cigarettes. He has never used smokeless tobacco. He reports that he does not currently use alcohol. He reports that he does not use drugs.  No Known Allergies Family History  Problem Relation Age of Onset   Cancer Mother        uterine, ovarian, and breast   Heart disease Father        MI   Diabetes Brother    Cancer Maternal Grandmother        skin   Cancer Maternal Grandfather        skin   Cancer Paternal Grandmother    Family history: Family history reviewed and not pertinent.  Prior to Admission medications   Medication Sig Start Date End Date Taking? Authorizing Provider  buPROPion  (WELLBUTRIN  XL) 300 MG 24 hr tablet Take 1 tablet (300 mg total) by mouth daily. 02/27/24   Johnson, Megan P, DO  lisinopril  (ZESTRIL ) 10 MG tablet Take 1 tablet (10 mg total) by mouth daily.  02/27/24   Vicci Bouchard P, DO  metoprolol  succinate (TOPROL -XL) 50 MG 24 hr tablet Take 1 tablet (50 mg total) by mouth daily. 02/27/24   Johnson, Megan P, DO  QUEtiapine  (SEROQUEL ) 25 MG tablet Take 1 tablet (25 mg total) by mouth at bedtime. 02/27/24   Johnson, Megan P, DO  varenicline  (CHANTIX  CONTINUING MONTH PAK) 1 MG tablet Take 1 tablet (1 mg total) by mouth 2 (two) times daily. 03/26/24   Vicci Bouchard P, DO  Varenicline  Tartrate, Starter, (CHANTIX  STARTING MONTH PAK) 0.5 MG X 11 & 1 MG X 42 TBPK As directed 02/27/24   Vicci Bouchard P, DO  venlafaxine  XR (EFFEXOR -XR) 150 MG 24 hr capsule Take 1 capsule (150 mg total) by mouth daily with breakfast. To be taken with 75mg  for 225mg  daily total 02/27/24   Vicci Bouchard P, DO  venlafaxine  XR (EFFEXOR -XR) 75 MG 24 hr capsule To be taken with 150mg  for 225mg  daily total. 02/27/24   Vicci Bouchard SQUIBB, DO   Physical Exam: Vitals:   04/07/24 1545 04/07/24 1547 04/07/24 1548 04/07/24 1554  BP:   98/69   Pulse: (!) 101   (!) 171  Resp: ROLLEN)  24     Temp: 98.4 F (36.9 C)     TempSrc: Oral     SpO2: 98%     Weight:  133.4 kg    Height:  5' 7 (1.702 m)     Constitutional: appears older than chronological age, NAD, calm Eyes: PERRL, lids and conjunctivae normal ENMT: Mucous membranes are moist. Posterior pharynx clear of any exudate or lesions.  Multiple teeth loss.  Poor dentition. Neck: normal, supple, no masses, no thyromegaly Respiratory: clear to auscultation bilaterally, no wheezing, no crackles. Normal respiratory effort. No accessory muscle use.  Cardiovascular: Regular rate and rhythm, no murmurs / rubs / gallops. No extremity edema. 2+ pedal pulses. No carotid bruits.  Abdomen: no tenderness, no masses palpated, no hepatosplenomegaly. Bowel sounds positive.  Musculoskeletal: Morbidly obese abdomen, no clubbing / cyanosis. No joint deformity upper and lower extremities. Good ROM, no contractures, no atrophy. Normal muscle tone.  Skin: no  rashes, lesions, ulcers. No induration Neurologic: Sensation intact. Strength 5/5 in all 4.  Psychiatric: Normal judgment and insight. Alert and oriented x 3. Normal mood.   EKG: independently reviewed, showing atrial fibrillation with rate of 100, QTc 445  Chest x-ray on Admission: I personally reviewed and I agree with radiologist reading as below.  US  Renal Result Date: 04/07/2024 CLINICAL DATA:  Renal failure. EXAM: RENAL / URINARY TRACT ULTRASOUND COMPLETE COMPARISON:  None Available. FINDINGS: Right Kidney: Renal measurements: 8.8 x 5.0 x 5.6 cm = volume: 128 mL. Increased renal parenchymal echogenicity. 4.7 cm simple cyst. No hydronephrosis or solid mass. Left Kidney: Renal measurements: 9.7 x 5.9 x 6.0 cm = volume: 179 mL. Increased renal parenchymal echogenicity. 1.2 cm simple cyst. No hydronephrosis or solid mass. Bladder: Appears normal for degree of bladder distention. Other: None. IMPRESSION: 1. Increased renal parenchymal echogenicity is in keeping with chronic medical renal disease. 2. Bilateral renal cysts. Electronically Signed   By: Newell Eke M.D.   On: 04/07/2024 18:27   DG Chest 2 View Result Date: 04/07/2024 CLINICAL DATA:  Shortness of breath and fatigue.  Anorexia. EXAM: CHEST - 2 VIEW COMPARISON:  11/26/2017 and CT chest 11/20/2009. FINDINGS: Trachea is midline. Heart size normal. Lungs are clear. No pleural fluid. IMPRESSION: No acute findings. Electronically Signed   By: Newell Eke M.D.   On: 04/07/2024 16:33   Labs on Admission: I have personally reviewed following labs  CBC: Recent Labs  Lab 04/07/24 1550  WBC 10.7*  HGB 12.5*  HCT 38.4*  MCV 94.8  PLT 253   Basic Metabolic Panel: Recent Labs  Lab 04/07/24 1550  NA 138  K 6.8*  CL 111  CO2 9*  GLUCOSE 139*  BUN 148*  CREATININE 22.29*  CALCIUM  6.7*   GFR: Estimated Creatinine Clearance: 4.8 mL/min (A) (by C-G formula based on SCr of 22.29 mg/dL (H)).  Liver Function Tests: Recent Labs   Lab 04/07/24 1550  AST 16  ALT 23  ALKPHOS 76  BILITOT 0.6  PROT 6.1*  ALBUMIN 3.1*   Recent Labs  Lab 04/07/24 1550  LIPASE 71*   Urine analysis:    Component Value Date/Time   APPEARANCEUR Clear 02/12/2021 1539   GLUCOSEU Negative 02/12/2021 1539   BILIRUBINUR Negative 02/12/2021 1539   PROTEINUR 2+ (A) 02/12/2021 1539   NITRITE Negative 02/12/2021 1539   LEUKOCYTESUR Negative 02/12/2021 1539   This document was prepared using Dragon Voice Recognition software and may include unintentional dictation errors.  Dr. Sherre [facility]  If 7PM-7AM, please  contact overnight-coverage provider If 7AM-7PM, please contact day attending provider [email or web address]  04/07/2024, 6:32 PM

## 2024-04-07 NOTE — Assessment & Plan Note (Signed)
 Home venlafaxine  225 mg p.o. daily before breakfast, bupropion  300 mg daily were resumed

## 2024-04-07 NOTE — Assessment & Plan Note (Signed)
 Home lisinopril  10 mg daily will not be resumed on admission Hydralazine  5 mg IV every 6 hours as needed for SBP greater 170, 5 days ordered

## 2024-04-07 NOTE — ED Triage Notes (Addendum)
 Pt to ED for SOB and fatigue and not eating or drinking much since 5-6 days. Also pt has been constipated since 2 weeks. Small BM today. Pt has been donating plasma, states I'm not homeless, but I might as well be. Not on thinners, states used to take something but can't remember the name. Pt appears pale.  Dinamap gave HR of 101, EKG showed HR of 171. First nurse informed.

## 2024-04-07 NOTE — Assessment & Plan Note (Signed)
 Status post Veltassa  25.2 g po, Lokelma  10 g p.o., sodium bicarbonate  50 mill equivalent IV one-time dose, sodium chloride  2 L bolus, calcium  gluconate 1 g IV one-time dose, albuterol  nebulizer, duo nebulizers Recheck BMP one-time dose per EDP EDP consulted nephrology, appreciate nephrology further recommendation guidance Sodium chloride  infusion at 150 mL/h, 1 day ordered Strict I's and O's Recheck BMP in the a.m.

## 2024-04-07 NOTE — Assessment & Plan Note (Signed)
 -  As needed nicotine patch ordered ?

## 2024-04-07 NOTE — Assessment & Plan Note (Signed)
 -  This complicates overall care and prognosis.

## 2024-04-07 NOTE — Assessment & Plan Note (Signed)
 CPAP nightly ordered

## 2024-04-07 NOTE — ED Notes (Signed)
 Ultrasound at bedside

## 2024-04-07 NOTE — Assessment & Plan Note (Signed)
 With weakness, likely due to poor p.o. intake and appetite with approaching ESRD.  TSH, B12 and CK levels were normal. -PT/OT evaluation

## 2024-04-07 NOTE — ED Provider Notes (Signed)
 [facility] Provider Note    Event Date/Time   First MD Initiated Contact with Patient 04/07/24 1557     (approximate)   History   Shortness of Breath  Pt to ED for SOB and fatigue and not eating or drinking much since 5-6 days. Also pt has been constipated since 2 weeks. Small BM today. Pt has been donating plasma, states I'm not homeless, but I might as well be. Not on thinners, states used to take something but can't remember the name. Pt appears pale.  Dinamap gave HR of 101, EKG showed HR of 171. First nurse informed.   HPI KIREN MCISAAC is a 58 y.o. male PMH A-fib on metoprolol  and not on anticoagulation, hypertension, panic disorder, BPH, hyperlipidemia presents for evaluation of shortness of breath, fatigue, constipation, cough - Patient is primarily here due to dyspnea/shortness of breath.  Notes he has been coughing for the past week and a half, it is productive though he is unsure what color.  No chest pain, palpitations, leg swelling, history of DVT/PE, recent surgery/stasis/travel.  Notes you felt so short of breath after getting home from going to the grocery store that he had to lay down on the ground.  No fevers. - Separately notes that he has been feeling very fatigued over this timeframe as well - Complains of constipation over the past 2 weeks.  Has been taking a laxative, does have small stool output but notes it is sometimes bloody. No abdominal pain.  He is tolerating p.o.  No vomiting.        Physical Exam   Triage Vital Signs: ED Triage Vitals  Encounter Vitals Group     BP 04/07/24 1548 98/69     Girls Systolic BP Percentile --      Girls Diastolic BP Percentile --      Boys Systolic BP Percentile --      Boys Diastolic BP Percentile --      Pulse Rate 04/07/24 1545 (!) 101     Resp 04/07/24 1545 (!) 24     Temp 04/07/24 1545 98.4 F (36.9 C)     Temp Source 04/07/24 1545 Oral     SpO2 04/07/24 1545 98 %     Weight  04/07/24 1547 294 lb (133.4 kg)     Height 04/07/24 1547 5' 7 (1.702 m)     Head Circumference --      Peak Flow --      Pain Score 04/07/24 1545 0     Pain Loc --      Pain Education --      Exclude from Growth Chart --     Most recent vital signs: Vitals:   04/07/24 1548 04/07/24 1554  BP: 98/69   Pulse:  (!) 171  Resp:    Temp:    SpO2:     HR 98 - 105 while I am in the room with patient.   General: Awake, no distress.  CV:  Good peripheral perfusion.  Mild tachycardia, regular rhythm.  No lower extremity swelling/edema appreciated. Resp:  Mild tachypnea, speaks full sentences.  CTAB mildly coarse breath sounds right lower lung field with expiratory wheezing present in this region only.  Otherwise clear lungs.  Abd:  No distention. Nontender to deep palpation throughout   ED Results / Procedures / Treatments   Labs (all labs ordered are listed, but only abnormal results are displayed) Labs Reviewed  BASIC METABOLIC PANEL WITH GFR -  Abnormal; Notable for the following components:      Result Value   Potassium 6.8 (*)    CO2 9 (*)    Glucose, Bld 139 (*)    BUN 148 (*)    Creatinine, Ser 22.29 (*)    Calcium  6.7 (*)    GFR, Estimated 2 (*)    Anion gap 18 (*)    All other components within normal limits  CBC - Abnormal; Notable for the following components:   WBC 10.7 (*)    RBC 4.05 (*)    Hemoglobin 12.5 (*)    HCT 38.4 (*)    All other components within normal limits  HEPATIC FUNCTION PANEL - Abnormal; Notable for the following components:   Total Protein 6.1 (*)    Albumin 3.1 (*)    All other components within normal limits  LIPASE, BLOOD - Abnormal; Notable for the following components:   Lipase 71 (*)    All other components within normal limits  TROPONIN I (HIGH SENSITIVITY) - Abnormal; Notable for the following components:   Troponin I (High Sensitivity) 21 (*)    All other components within normal limits  RESP PANEL BY RT-PCR (RSV, FLU A&B,  COVID)  RVPGX2  BRAIN NATRIURETIC PEPTIDE  D-DIMER, QUANTITATIVE  BASIC METABOLIC PANEL WITH GFR  BASIC METABOLIC PANEL WITH GFR  CBC  TROPONIN I (HIGH SENSITIVITY)     EKG  See ED course below   RADIOLOGY Chest x-ray interpreted myself and radiology report reviewed.  No acute pathology identified.  Renal ultrasound pending.   PROCEDURES:  Critical Care performed: Yes, see critical care procedure note(s)  .Critical Care  Performed by: Clarine Ozell LABOR, MD Authorized by: Clarine Ozell LABOR, MD   Critical care provider statement:    Critical care time (minutes):  30   Critical care time was exclusive of:  Separately billable procedures and treating other patients   Critical care was necessary to treat or prevent imminent or life-threatening deterioration of the following conditions:  Metabolic crisis   Critical care was time spent personally by me on the following activities:  Development of treatment plan with patient or surrogate, discussions with consultants, evaluation of patient's response to treatment, examination of patient, ordering and review of laboratory studies, ordering and review of radiographic studies, ordering and performing treatments and interventions, pulse oximetry, re-evaluation of patient's condition and review of old charts   I assumed direction of critical care for this patient from another provider in my specialty: no     Care discussed with: admitting provider      MEDICATIONS ORDERED IN ED: Medications  sodium zirconium cyclosilicate  (LOKELMA ) packet 10 g (has no administration in time range)  patiromer  (VELTASSA ) packet 25.2 g (has no administration in time range)  acetaminophen  (TYLENOL ) tablet 650 mg (has no administration in time range)    Or  acetaminophen  (TYLENOL ) suppository 650 mg (has no administration in time range)  ondansetron  (ZOFRAN ) tablet 4 mg (has no administration in time range)    Or  ondansetron  (ZOFRAN ) injection 4 mg (has no  administration in time range)  heparin  injection 5,000 Units (has no administration in time range)  0.9 %  sodium chloride  infusion (has no administration in time range)  sodium chloride  0.9 % bolus 1,000 mL (1,000 mLs Intravenous New Bag/Given 04/07/24 1643)  ipratropium-albuterol  (DUONEB) 0.5-2.5 (3) MG/3ML nebulizer solution 3 mL (3 mLs Nebulization Given 04/07/24 1642)  sodium chloride  0.9 % bolus 1,000 mL (1,000 mLs Intravenous New Bag/Given  04/07/24 1731)  albuterol  (PROVENTIL ) (2.5 MG/3ML) 0.083% nebulizer solution 10 mg (10 mg Nebulization Given 04/07/24 1732)  calcium  gluconate 1 g/ 50 mL sodium chloride  IVPB (1,000 mg Intravenous New Bag/Given 04/07/24 1731)  sodium bicarbonate  injection 50 mEq (50 mEq Intravenous Given 04/07/24 1732)     IMPRESSION / MDM / ASSESSMENT AND PLAN / ED COURSE  I reviewed the triage vital signs and the nursing notes.                              DDX/MDM/AP: Differential diagnosis includes, but is not limited to, viral syndrome including COVID-19 or influenza, pneumonia, exam less suggestive of CHF, suspect some degree of reactive airway disease/asthma contributing.  Consider underlying anemia or electrolyte abnormality.  Considered but doubt ACS, consider arrhythmia.  Consider PE.  With regard to constipation, very benign abdominal exam here, do not suspect obstruction, will plan for rectal exam to ensure no significant fecal impaction nor blood.  Plan: - Labs - Cardiac monitor - EKG - Chest x-ray - IV fluid, DuoNeb - Rectal exam - Reassess  Patient's presentation is most consistent with acute presentation with potential threat to life or bodily function.  The patient is on the cardiac monitor to evaluate for evidence of arrhythmia and/or significant heart rate changes.  ED course below.  Patient found to be in new renal failure with hyperkalemia, no obvious EKG changes appreciated, repeat EKG showing sinus rhythm with normal QTc and no obvious associated  changes.  Bladder scan with no urinary retention, do not suspect obstructive uropathy at this time.  Treating with Lokelma  as well as Veltassa  at the recommendation of the nephrologist with whom I spoke.  Giving IV fluid as well as albuterol .  Deferred insulin in the setting of profound renal failure and concern for possibility of hypoglycemia in the setting--low threshold to initiate if not improving with fluid resuscitation and other medications.  IV calcium  given and bicarb.  Notably uremic in the setting, suspect this is contributing to patient's generalized fatigue.  Admitted to hospitalist service for further management.  Clinical Course as of 04/07/24 1753  Sat Apr 07, 2024  1607 CBC with mild leukocytosis, mild anemia, last comparison 3 years ago [MM]  1608 Ecg = sinus rhythm versus flutter with 1:2 block, rate of about 100 (appears to be misinterpreted by EKG machine), no gross ST elevation or depression, no significant repolarization normality, normal axis, normal intervals.  No clear evidence of ischemia on my interpretation. [MM]  1627 Bnp wnl [MM]  1650 Cxr: IMPRESSION: No acute findings.   [MM]  1716 BMP with hyperkalemia to 6.8 as well as new renal failure, bicarb also notably low.  Significantly uremic, suspect this is because of his systemic symptoms.  Will give further IV fluid, Lokelma , albuterol , calcium .  Fortunately no obvious EKG changes at this time.  Will get bladder scan to ensure no significant urinary retention causing obstructive uropathy.  Plan for admission.  Renal ultrasound ordered. [MM]  1717 Troponin mildly elevated, suspect in the setting of renal failure and subsequent poor clearance [MM]  1718 ED secretary patient nephrology  Hospitalist consult order placed [MM]  1720 D/w Dr. Marcelino of nephro - recs veltassa  25.2g in addition to lokelma  - agrees w/ bladder scan and renal US  - recs trend labs w/ fluid and meds, can consider HD if not improving w/ IVF and  meds [MM]  1749 Bladder scan with no retention [  MM]    Clinical Course User Index [MM] Clarine Ozell LABOR, MD     FINAL CLINICAL IMPRESSION(S) / ED DIAGNOSES   Final diagnoses:  Renal failure, unspecified chronicity  Uremia  Hyperkalemia     Rx / DC Orders   ED Discharge Orders     None        Note:  This document was prepared using Dragon voice recognition software and may include unintentional dictation errors.   Clarine Ozell LABOR, MD 04/07/24 [phone]

## 2024-04-08 ENCOUNTER — Inpatient Hospital Stay

## 2024-04-08 DIAGNOSIS — E782 Mixed hyperlipidemia: Secondary | ICD-10-CM

## 2024-04-08 DIAGNOSIS — E872 Acidosis, unspecified: Secondary | ICD-10-CM

## 2024-04-08 DIAGNOSIS — N179 Acute kidney failure, unspecified: Secondary | ICD-10-CM | POA: Diagnosis not present

## 2024-04-08 DIAGNOSIS — N185 Chronic kidney disease, stage 5: Secondary | ICD-10-CM | POA: Diagnosis not present

## 2024-04-08 DIAGNOSIS — E875 Hyperkalemia: Secondary | ICD-10-CM | POA: Diagnosis not present

## 2024-04-08 DIAGNOSIS — E66813 Obesity, class 3: Secondary | ICD-10-CM | POA: Diagnosis not present

## 2024-04-08 DIAGNOSIS — R5383 Other fatigue: Secondary | ICD-10-CM

## 2024-04-08 DIAGNOSIS — E8721 Acute metabolic acidosis: Secondary | ICD-10-CM | POA: Diagnosis not present

## 2024-04-08 DIAGNOSIS — D631 Anemia in chronic kidney disease: Secondary | ICD-10-CM | POA: Diagnosis not present

## 2024-04-08 DIAGNOSIS — F32A Depression, unspecified: Secondary | ICD-10-CM | POA: Diagnosis not present

## 2024-04-08 DIAGNOSIS — I10 Essential (primary) hypertension: Secondary | ICD-10-CM

## 2024-04-08 DIAGNOSIS — Z720 Tobacco use: Secondary | ICD-10-CM

## 2024-04-08 DIAGNOSIS — G4733 Obstructive sleep apnea (adult) (pediatric): Secondary | ICD-10-CM | POA: Diagnosis not present

## 2024-04-08 LAB — VITAMIN B12: Vitamin B-12: 524 pg/mL (ref 180–914)

## 2024-04-08 LAB — BLOOD GAS, VENOUS
Acid-base deficit: 17.6 mmol/L — ABNORMAL HIGH (ref 0.0–2.0)
Acid-base deficit: 11 mmol/L — ABNORMAL HIGH (ref 0.0–2.0)
Bicarbonate: 9.6 mmol/L — ABNORMAL LOW (ref 20.0–28.0)
Bicarbonate: 14.7 mmol/L — ABNORMAL LOW (ref 20.0–28.0)
O2 Saturation: 51.8 %
O2 Saturation: 67.6 %
Patient temperature: 37
Patient temperature: 37
pCO2, Ven: 27 mmHg — ABNORMAL LOW (ref 44–60)
pCO2, Ven: 32 mmHg — ABNORMAL LOW (ref 44–60)
pH, Ven: 7.16 — CL (ref 7.25–7.43)
pH, Ven: 7.27 (ref 7.25–7.43)
pO2, Ven: 35 mmHg (ref 32–45)
pO2, Ven: 41 mmHg (ref 32–45)

## 2024-04-08 LAB — COMPREHENSIVE METABOLIC PANEL WITH GFR
ALT: 18 U/L (ref 0–44)
ALT: 19 U/L (ref 0–44)
AST: 14 U/L — ABNORMAL LOW (ref 15–41)
AST: 15 U/L (ref 15–41)
Albumin: 2.5 g/dL — ABNORMAL LOW (ref 3.5–5.0)
Albumin: 2.5 g/dL — ABNORMAL LOW (ref 3.5–5.0)
Alkaline Phosphatase: 61 U/L (ref 38–126)
Alkaline Phosphatase: 61 U/L (ref 38–126)
Anion gap: 15 (ref 5–15)
Anion gap: 15 (ref 5–15)
BUN: 139 mg/dL — ABNORMAL HIGH (ref 6–20)
BUN: 139 mg/dL — ABNORMAL HIGH (ref 6–20)
CO2: 10 mmol/L — ABNORMAL LOW (ref 22–32)
CO2: 13 mmol/L — ABNORMAL LOW (ref 22–32)
Calcium: 6 mg/dL — CL (ref 8.9–10.3)
Calcium: 6.4 mg/dL — CL (ref 8.9–10.3)
Chloride: 114 mmol/L — ABNORMAL HIGH (ref 98–111)
Chloride: 109 mmol/L (ref 98–111)
Creatinine, Ser: 19.31 mg/dL — ABNORMAL HIGH (ref 0.61–1.24)
Creatinine, Ser: 18.17 mg/dL — ABNORMAL HIGH (ref 0.61–1.24)
GFR, Estimated: 3 mL/min — ABNORMAL LOW (ref >60)
GFR, Estimated: 3 mL/min — ABNORMAL LOW (ref >60)
Glucose, Bld: 81 mg/dL (ref 70–99)
Glucose, Bld: 97 mg/dL (ref 70–99)
Potassium: 4.4 mmol/L (ref 3.5–5.1)
Potassium: 4.2 mmol/L (ref 3.5–5.1)
Sodium: 139 mmol/L (ref 135–145)
Sodium: 137 mmol/L (ref 135–145)
Total Bilirubin: 0.6 mg/dL (ref 0.0–1.2)
Total Bilirubin: 0.6 mg/dL (ref 0.0–1.2)
Total Protein: 4.9 g/dL — ABNORMAL LOW (ref 6.5–8.1)
Total Protein: 4.8 g/dL — ABNORMAL LOW (ref 6.5–8.1)

## 2024-04-08 LAB — CBC
HCT: 31.6 % — ABNORMAL LOW (ref 39.0–52.0)
Hemoglobin: 10.5 g/dL — ABNORMAL LOW (ref 13.0–17.0)
MCH: 31.4 pg (ref 26.0–34.0)
MCHC: 33.2 g/dL (ref 30.0–36.0)
MCV: 94.6 fL (ref 80.0–100.0)
Platelets: 281 K/uL (ref 150–400)
RBC: 3.34 MIL/uL — ABNORMAL LOW (ref 4.22–5.81)
RDW: 14.1 % (ref 11.5–15.5)
WBC: 7.9 K/uL (ref 4.0–10.5)
nRBC: 0 % (ref 0.0–0.2)

## 2024-04-08 LAB — PHOSPHORUS: Phosphorus: 11.9 mg/dL — ABNORMAL HIGH (ref 2.5–4.6)

## 2024-04-08 LAB — HEPATITIS B SURFACE ANTIGEN: Hepatitis B Surface Ag: NONREACTIVE

## 2024-04-08 LAB — MAGNESIUM: Magnesium: 2 mg/dL (ref 1.7–2.4)

## 2024-04-08 LAB — HIV ANTIBODY (ROUTINE TESTING W REFLEX): HIV Screen 4th Generation wRfx: NONREACTIVE

## 2024-04-08 MED ORDER — STERILE WATER FOR INJECTION IV SOLN
INTRAVENOUS | Status: DC
  Filled 2024-04-08: qty 150
  Filled 2024-04-08 (×2): qty 1000
  Filled 2024-04-08: qty 150

## 2024-04-08 MED ORDER — IOHEXOL 9 MG/ML PO SOLN
500.0000 mL | ORAL | Status: AC
  Administered 2024-04-08 (×2): 500 mL via ORAL

## 2024-04-08 MED ORDER — CALCIUM GLUCONATE-NACL 1-0.675 GM/50ML-% IV SOLN
1.0000 g | Freq: Once | INTRAVENOUS | Status: AC
  Administered 2024-04-08: 1000 mg via INTRAVENOUS
  Filled 2024-04-08: qty 50

## 2024-04-08 MED ORDER — CALCIUM GLUCONATE-NACL 2-0.675 GM/100ML-% IV SOLN
2.0000 g | Freq: Once | INTRAVENOUS | Status: AC
  Administered 2024-04-08: 2000 mg via INTRAVENOUS
  Filled 2024-04-08: qty 100

## 2024-04-08 NOTE — Assessment & Plan Note (Signed)
 Likely secondary to advanced renal disease, potassium was 6.8 with EKG changes on presentation. Received medical management.  Potassium now improved to 4.4. - Monitor potassium -Holding more Veltassa 

## 2024-04-08 NOTE — Progress Notes (Addendum)
 Date and time results received: 04/08/24 1:23 PM  Test: Ca+ Critical Value: 6.4  Name of Provider Notified: Caleen

## 2024-04-08 NOTE — Assessment & Plan Note (Signed)
 Significant known anion gap metabolic acidosis, some improvement of bicarb but still at 10. -Continue with bicarb infusion -Continue to monitor

## 2024-04-08 NOTE — Plan of Care (Signed)
   Problem: Education: Goal: Knowledge of General Education information will improve Description Including pain rating scale, medication(s)/side effects and non-pharmacologic comfort measures Outcome: Progressing

## 2024-04-08 NOTE — Assessment & Plan Note (Signed)
 Likely secondary to renal disease. -Calcium  gluconate was given -Check parathyroid  and phosphorus level -Monitor calcium -replete as needed

## 2024-04-08 NOTE — Significant Event (Signed)
       CROSS COVER NOTE  NAME: Brady Edwards MRN: [id number] DOB : [birth year] ATTENDING PHYSICIAN: Cox, Amy N, DO    Date of Service   04/08/2024   HPI/Events of Note   Patient admitted with profound severe frank renal failure with severe acidosis . Called intermittently with reports of soft pressures  Patietn was admitted with aki on ckd initial likely from poor oral intake. He was initially given 2 liters of of NS and treated for hyperkalemia. HCO3 on chem panel unchanged at 10. NS at 150 started. Renal ultrasound done on admission showed Increased renal parenchymal echogenicity is in keeping with chronic medical renal disease and bilateral renal cysts   Interventions   Assessment/Plan: VBG  Latest Reference Range & Units 04/07/24 22:27  pH, Ven 7.25 - 7.43  7.09 (LL)  pCO2, Ven 44 - 60 mmHg 35 (L)  pO2, Ven 32 - 45 mmHg 65 (H)  Acid-base deficit 0.0 - 2.0 mmol/L 18.3 (H)  Bicarbonate 20.0 - 28.0 mmol/L 10.6 (L)  O2 Saturation % 89.6  Patient temperature  37.0  Collection site  VEIN  (LL): Data is critically low (L): Data is abnormally low (H): Data is abnormally high   Latest Reference Range & Units 04/07/24 22:27  Sodium 135 - 145 mmol/L 137  Potassium 3.5 - 5.1 mmol/L 4.8  Chloride 98 - 111 mmol/L 112 (H)  CO2 22 - 32 mmol/L 10 (L)  Glucose 70 - 99 mg/dL 890 (H)  BUN 6 - 20 mg/dL 856 (H)  Creatinine 9.38 - 1.24 mg/dL 80.09 (H)  Calcium  8.9 - 10.3 mg/dL 6.3 (LL)  Anion gap 5 - 15  15  Albumin 3.5 - 5.0 g/dL 2.7 (L)  (LL): Data is critically low (H): Data is abnormally high (L): Data is abnormally low  2 AMPS SODIUM BICARB CHANGE IVF TO 150 MEQ SODIUM BICARB IN STERILE WATER  AT 150 CONTINUOUS   On reevl - further discussion regarding poor oral intake (covid ruled out), patient endorses no BM or urine output in 15 days, had one small BM today but without complete relief. He has lost 40 lbs in a month. He has never had a colonosopy. Last PSA 10/2022 = 1 normal  range) The only cancer that he knows of in his family is male cancers he is unsure the name and skin cancers Also endorses suicidal ideation with thoughts of how but no plan. He has reached out to contact person at outpatient clinic but has not been set up with anyone yet. Patient has been thinking a lot of his mother sister and nephews who was killed by his brother in law who then shot himself 12 years ago in [place]  - site of most recent floods.  MR Puls denies ever seeking help with this traumatic grief and states people who know him states he changed after this.    Repeat labs vbg and cmp stat Stat CT abdomen and pelvis r/o obstruction, mass etc In patient psych consult 2 gm calcium  gluconate Phos level added to am labs Continuous sodium bicarb infusion rate increased to 200 hour       Erminio LITTIE Cone NP [facility] Cross Cover 7pm-7am - check amion for availability Pager [phone]

## 2024-04-08 NOTE — Consult Note (Signed)
 [facility] CONSULT NOTE    Date: 04/08/2024                  Patient Name:  Brady Edwards  MRN: [id number]  DOB: 21-Sep-1965  Age / Sex: 58 y.o., male         PCP: Vicci Duwaine SQUIBB, DO                 Service Requesting Consult: Hospitalist                 Reason for Consult: Severe acute kidney injury, hyperkalemia, acute metabolic acidosis            History of Present Illness: Patient is a 58 y.o. male with a PMHx of hypertension, atrial fibrillation, BPH, hyperlipidemia, anxiety, constipation, morbid obesity, who was admitted to [facility] on 04/07/2024 for evaluation of fatigue, nausea, poor p.o. intake.  Patient states that he has had significant poor p.o. intake along with dysgeusia for at least 2 to 3 weeks.  In addition he is felt significantly tired and fatigued.  He has known chronic kidney disease dating back to 2024.  On 11/23/2022 creatinine was 1.84 with an EGFR 42.  When he presented he was found to have significant azotemia with a BUN of 148, creatinine 2.2.  He had associated metabolic acidosis with serum bicarbonate of 10.  Patient also found to have significant hyperkalemia which has now corrected with potassium initially of 6.8 and now down to 4.4 with treatment.  Renal ultrasound was negative for hydronephrosis but right kidney appeared atrophic.  He also has associated anemia with a hemoglobin of 10.5.  Patient also with significant hypocalcemia with serum calcium  of less than 6 however serum albumin also low at 2.5.   Medications: Outpatient medications: Medications Prior to Admission  Medication Sig Dispense Refill Last Dose/Taking   buPROPion  (WELLBUTRIN  XL) 300 MG 24 hr tablet Take 1 tablet (300 mg total) by mouth daily. 90 tablet 1 04/06/2024   lisinopril  (ZESTRIL ) 10 MG tablet Take 1 tablet (10 mg total) by mouth daily. 90 tablet 1 04/06/2024   metoprolol  succinate (TOPROL -XL) 50 MG 24 hr tablet Take 1 tablet (50 mg total) by mouth daily. 90  tablet 1 04/06/2024   venlafaxine  XR (EFFEXOR -XR) 150 MG 24 hr capsule Take 1 capsule (150 mg total) by mouth daily with breakfast. To be taken with 75mg  for 225mg  daily total 90 capsule 1 04/06/2024   venlafaxine  XR (EFFEXOR -XR) 75 MG 24 hr capsule To be taken with 150mg  for 225mg  daily total. 90 capsule 1 04/06/2024   QUEtiapine  (SEROQUEL ) 25 MG tablet Take 1 tablet (25 mg total) by mouth at bedtime. (Patient not taking: Reported on 04/07/2024) 90 tablet 1 Not Taking   varenicline  (CHANTIX  CONTINUING MONTH PAK) 1 MG tablet Take 1 tablet (1 mg total) by mouth 2 (two) times daily. (Patient not taking: Reported on 04/07/2024) 180 tablet 0 Not Taking   Varenicline  Tartrate, Starter, (CHANTIX  STARTING MONTH PAK) 0.5 MG X 11 & 1 MG X 42 TBPK As directed (Patient not taking: Reported on 04/07/2024) 53 each 0 Not Taking    Current medications: Current Facility-Administered Medications  Medication Dose Route Frequency Provider Last Rate Last Admin   acetaminophen  (TYLENOL ) tablet 650 mg  650 mg Oral Q6H PRN Cox, Amy N, DO       Or   acetaminophen  (TYLENOL ) suppository 650 mg  650 mg Rectal Q6H PRN Cox, Amy N, DO  buPROPion  (WELLBUTRIN  XL) 24 hr tablet 300 mg  300 mg Oral Daily Cox, Amy N, DO   300 mg at 04/08/24 9060   heparin  injection 5,000 Units  5,000 Units Subcutaneous Q8H Cox, Amy N, DO   5,000 Units at 04/08/24 0600   hydrALAZINE  (APRESOLINE ) injection 5 mg  5 mg Intravenous Q6H PRN Cox, Amy N, DO       ipratropium-albuterol  (DUONEB) 0.5-2.5 (3) MG/3ML nebulizer solution 3 mL  3 mL Nebulization Q6H PRN Cox, Amy N, DO       metoprolol  succinate (TOPROL -XL) 24 hr tablet 50 mg  50 mg Oral Daily Cox, Amy N, DO   50 mg at 04/08/24 9060   metoprolol  tartrate (LOPRESSOR ) injection 5 mg  5 mg Intravenous Q4H PRN Cox, Amy N, DO       nicotine  (NICODERM CQ  - dosed in mg/24 hours) patch 21 mg  21 mg Transdermal Daily PRN Cox, Amy N, DO       ondansetron  (ZOFRAN ) tablet 4 mg  4 mg Oral Q6H PRN Cox, Amy N, DO        Or   ondansetron  (ZOFRAN ) injection 4 mg  4 mg Intravenous Q6H PRN Cox, Amy N, DO       QUEtiapine  (SEROQUEL ) tablet 25 mg  25 mg Oral QHS Cox, Amy N, DO       sodium bicarbonate  150 mEq in sterile water  1,150 mL infusion   Intravenous Continuous Jesus America, NP 200 mL/hr at 04/08/24 0703 Infusion Verify at 04/08/24 0703   venlafaxine  XR (EFFEXOR -XR) 24 hr capsule 150 mg  150 mg Oral Q breakfast Cox, Amy N, DO   150 mg at 04/08/24 9060   venlafaxine  XR (EFFEXOR -XR) 24 hr capsule 75 mg  75 mg Oral Q breakfast Cox, Amy N, DO   75 mg at 04/08/24 9060      Allergies: No Known Allergies    Past Medical History: Past Medical History:  Diagnosis Date   Atrial fibrillation (HCC)    Depression    Hypertension      Past Surgical History: History reviewed. No pertinent surgical history.   Family History: Family History  Problem Relation Age of Onset   Cancer Mother        uterine, ovarian, and breast   Heart disease Father        MI   Diabetes Brother    Cancer Maternal Grandmother        skin   Cancer Maternal Grandfather        skin   Cancer Paternal Grandmother      Social History: Social History   Socioeconomic History   Marital status: Divorced    Spouse name: Not on file   Number of children: Not on file   Years of education: Not on file   Highest education level: Not on file  Occupational History   Not on file  Tobacco Use   Smoking status: Every Day    Current packs/day: 1.00    Types: Cigarettes   Smokeless tobacco: Never  Vaping Use   Vaping status: Never Used  Substance and Sexual Activity   Alcohol use: Not Currently    Alcohol/week: 0.0 standard drinks of alcohol   Drug use: No   Sexual activity: Not Currently  Other Topics Concern   Not on file  Social History Narrative   Not on file   Social Drivers of Health   Financial Resource Strain: Not on file  Food Insecurity: No Food  Insecurity (01/31/2024)   Hunger Vital Sign    Worried  About Running Out of Food in the Last Year: Never true    Ran Out of Food in the Last Year: Never true  Transportation Needs: Unmet Transportation Needs (10/28/2023)   PRAPARE - Administrator, Civil Service (Medical): Yes    Lack of Transportation (Non-Medical): Yes  Physical Activity: Not on file  Stress: Not on file  Social Connections: Not on file  Intimate Partner Violence: Not At Risk (01/31/2024)   Humiliation, Afraid, Rape, and Kick questionnaire    Fear of Current or Ex-Partner: No    Emotionally Abused: No    Physically Abused: No    Sexually Abused: No     Review of Systems: Review of Systems  Constitutional:  Positive for malaise/fatigue. Negative for chills and fever.  HENT:  Negative for congestion, hearing loss and tinnitus.   Eyes:  Negative for blurred vision and double vision.  Respiratory:  Negative for cough, sputum production and shortness of breath.   Cardiovascular:  Negative for chest pain, palpitations and orthopnea.  Gastrointestinal:  Positive for nausea and vomiting. Negative for diarrhea.  Genitourinary:  Negative for dysuria, frequency and urgency.  Musculoskeletal:  Negative for myalgias.  Skin:  Negative for itching and rash.  Neurological:  Negative for dizziness and focal weakness.  Endo/Heme/Allergies:  Negative for polydipsia. Does not bruise/bleed easily.  Psychiatric/Behavioral:  The patient is not nervous/anxious.      Vital Signs: Blood pressure 112/63, pulse 90, temperature 97.9 F (36.6 C), resp. rate 18, height 5' 7 (1.702 m), weight 133.4 kg, SpO2 96%.  Weight trends: Filed Weights   04/07/24 1547  Weight: 133.4 kg     Physical Exam: General: No acute distress  Head: Normocephalic, atraumatic. Moist oral mucosal membranes  Eyes: Anicteric  Neck: Supple  Lungs:  Clear to auscultation, normal effort  Heart: S1S2 no rubs  Abdomen:  Soft, nontender, bowel sounds present  Extremities: No peripheral edema.   Neurologic: Awake, alert, following commands  Skin: No acute rash  Access: No hemodialysis access    Lab results: Basic Metabolic Panel: Recent Labs  Lab 04/07/24 1550 04/07/24 1756 04/07/24 2227 04/08/24 0412  NA 138 138 137 139  K 6.8* 6.3* 4.8 4.4  CL 111 111 112* 114*  CO2 9* 10* 10* 10*  GLUCOSE 139* 103* 109* 81  BUN 148* 143* 143* 139*  CREATININE 22.29* 21.09* 19.90* 19.31*  CALCIUM  6.7* 6.6* 6.3* 6.0*  MG 2.4  --   --  2.0    Liver Function Tests: Recent Labs  Lab 04/07/24 1550 04/07/24 2227 04/08/24 0412  AST 16  --  14*  ALT 23  --  18  ALKPHOS 76  --  61  BILITOT 0.6  --  0.6  PROT 6.1*  --  4.9*  ALBUMIN 3.1* 2.7* 2.5*   Recent Labs  Lab 04/07/24 1550  LIPASE 71*   No results for input(s): AMMONIA in the last 168 hours.  CBC: Recent Labs  Lab 04/07/24 1550 04/08/24 0412  WBC 10.7* 7.9  HGB 12.5* 10.5*  HCT 38.4* 31.6*  MCV 94.8 94.6  PLT 253 281    Cardiac Enzymes: Recent Labs  Lab 04/07/24 1550  CKTOTAL 409*    BNP: Invalid input(s): POCBNP  CBG: No results for input(s): GLUCAP in the last 168 hours.  Microbiology: Results for orders placed or performed during the hospital encounter of 04/07/24  Resp panel by RT-PCR (RSV,  Flu A&B, Covid) Anterior Nasal Swab     Status: None   Collection Time: 04/07/24  4:46 PM   Specimen: Anterior Nasal Swab  Result Value Ref Range Status   SARS Coronavirus 2 by RT PCR NEGATIVE NEGATIVE Final    Comment: (NOTE) SARS-CoV-2 target nucleic acids are NOT DETECTED.  The SARS-CoV-2 RNA is generally detectable in upper respiratory specimens during the acute phase of infection. The lowest concentration of SARS-CoV-2 viral copies this assay can detect is 138 copies/mL. A negative result does not preclude SARS-Cov-2 infection and should not be used as the sole basis for treatment or other patient management decisions. A negative result may occur with  improper specimen  collection/handling, submission of specimen other than nasopharyngeal swab, presence of viral mutation(s) within the areas targeted by this assay, and inadequate number of viral copies(<138 copies/mL). A negative result must be combined with clinical observations, patient history, and epidemiological information. The expected result is Negative.  Fact Sheet for Patients:  BloggerCourse.com  Fact Sheet for Healthcare Providers:  SeriousBroker.it  This test is no t yet approved or cleared by the [place]  FDA and  has been authorized for detection and/or diagnosis of SARS-CoV-2 by FDA under an Emergency Use Authorization (EUA). This EUA will remain  in effect (meaning this test can be used) for the duration of the COVID-19 declaration under Section 564(b)(1) of the Act, 21 U.S.C.section 360bbb-3(b)(1), unless the authorization is terminated  or revoked sooner.       Influenza A by PCR NEGATIVE NEGATIVE Final   Influenza B by PCR NEGATIVE NEGATIVE Final    Comment: (NOTE) The Xpert Xpress SARS-CoV-2/FLU/RSV plus assay is intended as an aid in the diagnosis of influenza from Nasopharyngeal swab specimens and should not be used as a sole basis for treatment. Nasal washings and aspirates are unacceptable for Xpert Xpress SARS-CoV-2/FLU/RSV testing.  Fact Sheet for Patients: BloggerCourse.com  Fact Sheet for Healthcare Providers: SeriousBroker.it  This test is not yet approved or cleared by the [place]  FDA and has been authorized for detection and/or diagnosis of SARS-CoV-2 by FDA under an Emergency Use Authorization (EUA). This EUA will remain in effect (meaning this test can be used) for the duration of the COVID-19 declaration under Section 564(b)(1) of the Act, 21 U.S.C. section 360bbb-3(b)(1), unless the authorization is terminated or revoked.     Resp Syncytial  Virus by PCR NEGATIVE NEGATIVE Final    Comment: (NOTE) Fact Sheet for Patients: BloggerCourse.com  Fact Sheet for Healthcare Providers: SeriousBroker.it  This test is not yet approved or cleared by the [place]  FDA and has been authorized for detection and/or diagnosis of SARS-CoV-2 by FDA under an Emergency Use Authorization (EUA). This EUA will remain in effect (meaning this test can be used) for the duration of the COVID-19 declaration under Section 564(b)(1) of the Act, 21 U.S.C. section 360bbb-3(b)(1), unless the authorization is terminated or revoked.  Performed at [facility], [address] [place], [place] [address]     Coagulation Studies: No results for input(s): LABPROT, INR in the last 72 hours.  Urinalysis: No results for input(s): COLORURINE, LABSPEC, PHURINE, GLUCOSEU, HGBUR, BILIRUBINUR, KETONESUR, PROTEINUR, UROBILINOGEN, NITRITE, LEUKOCYTESUR in the last 72 hours.  Invalid input(s): APPERANCEUR    Imaging: CT ABDOMEN PELVIS WO CONTRAST Result Date: 04/08/2024 EXAM: CT ABDOMEN AND PELVIS WITHOUT CONTRAST 04/08/2024 08:38:50 AM TECHNIQUE: CT of the abdomen and pelvis was performed without the administration of intravenous contrast. Multiplanar reformatted images are provided for review.  Automated exposure control, iterative reconstruction, and/or weight based adjustment of the mA/kV was utilized to reduce the radiation dose to as low as reasonably achievable. COMPARISON: Renal ultrasound 04/07/2024. CLINICAL HISTORY: Bowel obstruction suspected. Principal Problem: Renal failure (ARF), acute on chronic (HCC). FINDINGS: LOWER CHEST: No acute abnormality. LIVER: The liver is unremarkable. GALLBLADDER AND BILE DUCTS: Gallbladder is unremarkable. No biliary ductal dilatation. SPLEEN: No acute abnormality. PANCREAS: No acute abnormality. ADRENAL GLANDS: No acute abnormality.  KIDNEYS, URETERS AND BLADDER: Simple cysts are again noted in both kidneys measuring up to 3.3 cm at the lower pole of the right kidney and 2.1 cm at the lower pole of the left kidney. Per consensus, no follow-up is needed for simple Bosniak type 1 and 2 renal cysts, unless the patient has a malignancy history or risk factors. No stones in the kidneys or ureters. No hydronephrosis. No perinephric or periureteral stranding. Urinary bladder is unremarkable. GI AND BOWEL: Stomach demonstrates no acute abnormality. There is no bowel obstruction. No bowel wall thickening. PERITONEUM AND RETROPERITONEUM: No ascites. No free air. VASCULATURE: Minimal atherosclerotic calcifications are present in the aorta. No aneurysm is present. LYMPH NODES: No lymphadenopathy. REPRODUCTIVE ORGANS: No acute abnormality. BONES AND SOFT TISSUES: Increased density along the inferior aspect of the L2 vertebral body suggests a subacute fracture. MRI of the lumbar spine would be useful for further evaluation. Fat herniates into the right inguinal canal without associated bowel. IMPRESSION: 1. No CT evidence of bowel obstruction. 2. Subacute fracture of the L2 vertebral body. Recommend MRI of the lumbar spine for further evaluation. Electronically signed by: Lonni Necessary MD 04/08/2024 08:53 AM EDT RP Workstation: [id number]   US  Renal Result Date: 04/07/2024 CLINICAL DATA:  Renal failure. EXAM: RENAL / URINARY TRACT ULTRASOUND COMPLETE COMPARISON:  None Available. FINDINGS: Right Kidney: Renal measurements: 8.8 x 5.0 x 5.6 cm = volume: 128 mL. Increased renal parenchymal echogenicity. 4.7 cm simple cyst. No hydronephrosis or solid mass. Left Kidney: Renal measurements: 9.7 x 5.9 x 6.0 cm = volume: 179 mL. Increased renal parenchymal echogenicity. 1.2 cm simple cyst. No hydronephrosis or solid mass. Bladder: Appears normal for degree of bladder distention. Other: None. IMPRESSION: 1. Increased renal parenchymal echogenicity is in keeping  with chronic medical renal disease. 2. Bilateral renal cysts. Electronically Signed   By: Newell Eke M.D.   On: 04/07/2024 18:27   DG Chest 2 View Result Date: 04/07/2024 CLINICAL DATA:  Shortness of breath and fatigue.  Anorexia. EXAM: CHEST - 2 VIEW COMPARISON:  11/26/2017 and CT chest 11/20/2009. FINDINGS: Trachea is midline. Heart size normal. Lungs are clear. No pleural fluid. IMPRESSION: No acute findings. Electronically Signed   By: Newell Eke M.D.   On: 04/07/2024 16:33     Assessment & Plan: Pt is a 58 y.o. male with a PMHx of hypertension, atrial fibrillation, BPH, hyperlipidemia, anxiety, constipation, morbid obesity, who was admitted to [facility] on 04/07/2024 for evaluation of fatigue, nausea, poor p.o. intake.   1.  Acute kidney injury with suspected end-stage renal disease.  There are several signs pointing to chronicity of renal insufficiency.  He has a rather atrophic right kidney measuring 8.8 cm.  In addition patient with anemia, severe acute metabolic acidosis, hypocalcemia.  These all point to chronicity of disease.  He has had uremic symptoms for several weeks.  We will proceed with extensive serologic workup including SPEP, UPEP, ANA, ANCA antibodies, urine protein to creatinine ratio.  Would not recommend renal biopsy at this time given high risk  for bleeding given uremia.  If this is indeed ESRD yield may be low also.  We will initiate renal placement therapy and patient agreeable to this plan.  We will proceed with temporary dialysis catheter tomorrow and avoid PermCath for now again given ongoing uremia.  2.  Hyperkalemia.  Serum potassium down to 4.4.  Continue to monitor serum potassium closely.  3.  Severe acute metabolic acidosis.  Start the patient on sodium bicarbonate  drip.  4.  Anemia likely of chronic kidney disease.  Hemoglobin down to 10.5.  No immediate need for Epogen.  Continue to monitor CBC.  5.  Thanks for consultation.

## 2024-04-08 NOTE — Progress Notes (Signed)
 Progress Note   [name] FMW:[id number] DOB: 09/26/1965 DOA: 04/07/2024     1 DOS: the patient was seen and examined on 04/08/2024   Brief hospital course: [name] is a 58 year old male with history of hypertension, atrial fibrillation on metoprolol  not on anticoagulation, BPH, hyperlipidemia, anxiety, constipation, morbid obesity, who presents emergency department for chief concerns of fatigue, and no p.o. intake for the last 5 to 6-day.  Vitals in the ED showed temperature of 98.4, respiration rate 24, heart rate initially 171, improved to 92, blood pressure initially 98/69, improved to 101/63, SpO2 of 98% on room air.  Serum sodium is 138, potassium 6.8, chloride 111, bicarb 9, BUN of 148, serum creatinine 22.29, EGFR of 2, nonfasting glucose 139, WBC 10.7, hemoglobin 12.5, platelets of 253.  BNP is 12.9.  HS troponin is 21.  ED treatment: DuoNebs, Veltassa  one-time dose, albuterol  nebulizer, Lokelma , calcium  gluconate IV one-time dose, sodium chloride  2 L bolus, sodium bicarbonate .  EDP consulted with nephrology who recommended Lokelma  and Veltassa .  EDP ordered ultrasound of the renal.  8/10: Vitals mostly stable, on room air, renal ultrasound with chronic medical renal disease and bilateral renal cysts, labs with resolution of hyperkalemia, persistent non-anion gap metabolic acidosis with bicarb at 10-received IV bicarb and started on bicarb infusion.  Small improvement in creatinine now at 19.31, persistent hypocalcemia-was given calcium  gluconate.  Parathyroid  and phosphorus levels were ordered-likely secondary to renal failure TSH was normal.  Patient is likely now ESRD and nephrology is planning to start him on dialysis from tomorrow.  Assessment and Plan: * Renal failure (ARF), acute on chronic (HCC) Likely ESRD as renal ultrasound with medical renal disease, severely oliguric.  Bladder scan negative. Seems like underlying CKD stage IIIa when checked about a  year ago.  No further documented follow-up. - Nephrology is planning to start dialysis from tomorrow -Continue with bicarb infusion -Monitor renal function  Hyperkalemia Likely secondary to advanced renal disease, potassium was 6.8 with EKG changes on presentation. Received medical management.  Potassium now improved to 4.4. - Monitor potassium -Holding more Veltassa   Hypocalcemia Likely secondary to renal disease. -Calcium  gluconate was given -Check parathyroid  and phosphorus level -Monitor calcium -replete as needed  Metabolic acidosis Significant known anion gap metabolic acidosis, some improvement of bicarb but still at 10. -Continue with bicarb infusion -Continue to monitor  Fatigue With weakness, likely due to poor p.o. intake and appetite with approaching ESRD.  TSH, B12 and CK levels were normal. -PT/OT evaluation  Atrial fibrillation, chronic (HCC) Home metoprolol  succinate 50 mg daily resume for 04/08/2024 Metoprolol  tartrate 5 mg IV every 4 hours as needed for heart rate greater than 120, 3 doses ordered Patient not taking anticoagulation  Hypertension Home lisinopril  10 mg daily will not be resumed on admission Hydralazine  5 mg IV every 6 hours as needed for SBP greater 170, 5 days ordered  Depression Home venlafaxine  225 mg p.o. daily before breakfast, bupropion  300 mg daily were resumed  OSA (obstructive sleep apnea) CPAP nightly ordered  Obesity, Class III, BMI 40-49.9 (morbid obesity) Estimated body mass index is 46.05 kg/m as calculated from the following:   Height as of this encounter: 5' 7 (1.702 m).   Weight as of this encounter: 133.4 kg.   This complicates overall care and prognosis.   Tobacco abuse As needed nicotine  patch ordered   Subjective: Patient was seen and examined today.  Continued to feel weak, appetite remained poor.  No nausea or vomiting.  Physical Exam: Vitals:   04/08/24 0326 04/08/24 0355 04/08/24 0819 04/08/24 1104  BP:  (!) 89/52 (!) 102/59 118/64 112/63  Pulse: 96 91 (!) 102 90  Resp:  18    Temp:  97.9 F (36.6 C) 97.9 F (36.6 C) 97.9 F (36.6 C)  TempSrc:  Oral    SpO2:  98% 99% 96%  Weight:      Height:       General.  Morbidly obese gentleman, in no acute distress. Pulmonary.  Lungs clear bilaterally, normal respiratory effort. CV.  Regular rate and rhythm, no JVD, rub or murmur. Abdomen.  Soft, nontender, nondistended, BS positive. CNS.  Alert and oriented .  No focal neurologic deficit. Extremities.  No edema, no cyanosis, pulses intact and symmetrical. Psychiatry.  Judgment and insight appears normal.   Data Reviewed: Prior data reviewed  Family Communication: Discussed with patient  Disposition: Status is: Inpatient Remains inpatient appropriate because: Severity of illness  Planned Discharge Destination: Home  DVT prophylaxis.  Subcu heparin  Time spent: 50 minutes  This record has been created using Conservation officer, historic buildings. Errors have been sought and corrected,but may not always be located. Such creation errors do not reflect on the standard of care.   Author: Amaryllis Dare, MD 04/08/2024 1:00 PM  For on call review [email or web address].

## 2024-04-09 ENCOUNTER — Encounter
Admission: EM | Disposition: A | Discharge status: Home / Self Care | Payer: Self-pay | Source: Home / Self Care | Attending: Internal Medicine

## 2024-04-09 DIAGNOSIS — N185 Chronic kidney disease, stage 5: Secondary | ICD-10-CM

## 2024-04-09 DIAGNOSIS — R5383 Other fatigue: Secondary | ICD-10-CM | POA: Diagnosis not present

## 2024-04-09 DIAGNOSIS — E66813 Obesity, class 3: Secondary | ICD-10-CM | POA: Diagnosis not present

## 2024-04-09 DIAGNOSIS — E8721 Acute metabolic acidosis: Secondary | ICD-10-CM | POA: Diagnosis not present

## 2024-04-09 DIAGNOSIS — G4733 Obstructive sleep apnea (adult) (pediatric): Secondary | ICD-10-CM | POA: Diagnosis not present

## 2024-04-09 DIAGNOSIS — N19 Unspecified kidney failure: Secondary | ICD-10-CM

## 2024-04-09 DIAGNOSIS — N186 End stage renal disease: Secondary | ICD-10-CM | POA: Diagnosis not present

## 2024-04-09 DIAGNOSIS — F32A Depression, unspecified: Secondary | ICD-10-CM | POA: Diagnosis not present

## 2024-04-09 DIAGNOSIS — E782 Mixed hyperlipidemia: Secondary | ICD-10-CM | POA: Diagnosis not present

## 2024-04-09 DIAGNOSIS — D631 Anemia in chronic kidney disease: Secondary | ICD-10-CM | POA: Diagnosis not present

## 2024-04-09 DIAGNOSIS — E875 Hyperkalemia: Secondary | ICD-10-CM | POA: Diagnosis not present

## 2024-04-09 DIAGNOSIS — E872 Acidosis, unspecified: Secondary | ICD-10-CM | POA: Diagnosis not present

## 2024-04-09 DIAGNOSIS — N179 Acute kidney failure, unspecified: Secondary | ICD-10-CM | POA: Diagnosis not present

## 2024-04-09 HISTORY — PX: TEMPORARY DIALYSIS CATHETER: CATH118312

## 2024-04-09 LAB — CBC
HCT: 26.3 % — ABNORMAL LOW (ref 39.0–52.0)
Hemoglobin: 9 g/dL — ABNORMAL LOW (ref 13.0–17.0)
MCH: 31.1 pg (ref 26.0–34.0)
MCHC: 34.2 g/dL (ref 30.0–36.0)
MCV: 91 fL (ref 80.0–100.0)
Platelets: 183 K/uL (ref 150–400)
RBC: 2.89 MIL/uL — ABNORMAL LOW (ref 4.22–5.81)
RDW: 13.5 % (ref 11.5–15.5)
WBC: 6.6 K/uL (ref 4.0–10.5)
nRBC: 0 % (ref 0.0–0.2)

## 2024-04-09 LAB — ANCA PROFILE
Anti-MPO Antibodies: <0.2 U (ref 0.0–0.9)
Anti-PR3 Antibodies: <0.2 U (ref 0.0–0.9)
Atypical P-ANCA titer: <1:20 {titer}
C-ANCA: <1:20 {titer}
P-ANCA: <1:20 {titer}

## 2024-04-09 LAB — RENAL FUNCTION PANEL
Albumin: 2.4 g/dL — ABNORMAL LOW (ref 3.5–5.0)
Anion gap: 15 (ref 5–15)
BUN: 139 mg/dL — ABNORMAL HIGH (ref 6–20)
CO2: 16 mmol/L — ABNORMAL LOW (ref 22–32)
Calcium: 6.2 mg/dL — CL (ref 8.9–10.3)
Chloride: 110 mmol/L (ref 98–111)
Creatinine, Ser: 17.37 mg/dL — ABNORMAL HIGH (ref 0.61–1.24)
GFR, Estimated: 3 mL/min — ABNORMAL LOW (ref >60)
Glucose, Bld: 82 mg/dL (ref 70–99)
Phosphorus: 11.6 mg/dL — ABNORMAL HIGH (ref 2.5–4.6)
Potassium: 3.8 mmol/L (ref 3.5–5.1)
Sodium: 141 mmol/L (ref 135–145)

## 2024-04-09 LAB — LIPID PANEL
Cholesterol: 119 mg/dL (ref 0–200)
HDL: 25 mg/dL — ABNORMAL LOW (ref >40)
LDL Cholesterol: 66 mg/dL (ref 0–99)
Total CHOL/HDL Ratio: 4.8 ratio
Triglycerides: 139 mg/dL (ref <150)
VLDL: 28 mg/dL (ref 0–40)

## 2024-04-09 LAB — GLOMERULAR BASEMENT MEMBRANE ANTIBODIES: GBM Ab: <0.2 U (ref 0.0–0.9)

## 2024-04-09 LAB — PARATHYROID HORMONE, INTACT (NO CA): PTH: 199 pg/mL — ABNORMAL HIGH (ref 15–65)

## 2024-04-09 LAB — ANA W/REFLEX IF POSITIVE: Anti Nuclear Antibody (ANA): NEGATIVE

## 2024-04-09 SURGERY — TEMPORARY DIALYSIS CATHETER
Anesthesia: Moderate Sedation

## 2024-04-09 MED ORDER — ORAL CARE MOUTH RINSE
15.0000 mL | OROMUCOSAL | Status: DC | PRN
Start: 2024-04-09 — End: 2024-04-13

## 2024-04-09 MED ORDER — CALCIUM GLUCONATE-NACL 1-0.675 GM/50ML-% IV SOLN
1.0000 g | Freq: Once | INTRAVENOUS | Status: AC
  Administered 2024-04-09 (×2): 1000 mg via INTRAVENOUS
  Filled 2024-04-09: qty 50

## 2024-04-09 MED ORDER — SODIUM CHLORIDE 0.9 % IV SOLN: INTRAVENOUS | Status: DC

## 2024-04-09 MED ORDER — CHLORHEXIDINE GLUCONATE CLOTH 2 % EX PADS
6.0000 | MEDICATED_PAD | Freq: Every day | CUTANEOUS | Status: DC
  Administered 2024-04-10 – 2024-04-13 (×6): 6 via TOPICAL

## 2024-04-09 MED ORDER — HEPARIN SODIUM (PORCINE) 1000 UNIT/ML IJ SOLN
INTRAMUSCULAR | Status: AC
  Filled 2024-04-09: qty 4

## 2024-04-09 MED ORDER — CALCIUM CARBONATE 1250 (500 CA) MG PO TABS
1.0000 | ORAL_TABLET | Freq: Three times a day (TID) | ORAL | Status: DC
  Administered 2024-04-09 – 2024-04-12 (×17): 1250 mg via ORAL
  Filled 2024-04-09 (×16): qty 1

## 2024-04-09 MED ORDER — LIDOCAINE HCL (PF) 1 % IJ SOLN
INTRAMUSCULAR | Status: DC | PRN
  Administered 2024-04-09 (×2): 5 mL

## 2024-04-09 SURGICAL SUPPLY — 2 items
COVER PROBE ULTRASOUND 5X96 (MISCELLANEOUS) IMPLANT
KIT DIALYSIS CATH TRI 30X13 (CATHETERS) IMPLANT

## 2024-04-09 NOTE — Progress Notes (Signed)
 Contacted to assist with outpatient HD arrangements. Additional clinical information  will be provided as results become available.  Will attempt to meet with pt at bedside tomorrow.  Will assist as needed.  Suzen Satchel Dialysis Navigator [phone]

## 2024-04-09 NOTE — Consult Note (Signed)
 Hospital Consult    Reason for Consult:  Dialysis Access needed  Requesting Physician:  Dr Bonnell Sherry MD  MRN #:  [id number]  History of Present Illness: This is a 58 y.o. male  Brady Edwards is a 58 year old male with history of hypertension, atrial fibrillation on metoprolol  not on anticoagulation, BPH, hyperlipidemia, anxiety, constipation, morbid obesity, who presents emergency department for chief concerns of fatigue, and no p.o. intake for the last 5 to 6-day.   Upon workup in the emergency department the patient was noted to have a BUN of 148, and a serum creatinine of 22.29, and an EKG FOR 2.  Patient is now needing hemodialysis.  Vascular surgery was consulted to place temporary dialysis catheter.  Past Medical History:  Diagnosis Date   Atrial fibrillation (HCC)    Depression    Hypertension     History reviewed. No pertinent surgical history.  No Known Allergies  Prior to Admission medications   Medication Sig Start Date End Date Taking? Authorizing Provider  buPROPion  (WELLBUTRIN  XL) 300 MG 24 hr tablet Take 1 tablet (300 mg total) by mouth daily. 02/27/24  Yes Johnson, Megan P, DO  lisinopril  (ZESTRIL ) 10 MG tablet Take 1 tablet (10 mg total) by mouth daily. 02/27/24  Yes Johnson, Megan P, DO  metoprolol  succinate (TOPROL -XL) 50 MG 24 hr tablet Take 1 tablet (50 mg total) by mouth daily. 02/27/24  Yes Johnson, Megan P, DO  venlafaxine  XR (EFFEXOR -XR) 150 MG 24 hr capsule Take 1 capsule (150 mg total) by mouth daily with breakfast. To be taken with 75mg  for 225mg  daily total 02/27/24  Yes Johnson, Megan P, DO  venlafaxine  XR (EFFEXOR -XR) 75 MG 24 hr capsule To be taken with 150mg  for 225mg  daily total. 02/27/24  Yes Johnson, Megan P, DO  QUEtiapine  (SEROQUEL ) 25 MG tablet Take 1 tablet (25 mg total) by mouth at bedtime. Patient not taking: Reported on 04/07/2024 02/27/24   Vicci Bouchard P, DO  varenicline  (CHANTIX  CONTINUING MONTH PAK) 1 MG tablet Take 1 tablet (1 mg  total) by mouth 2 (two) times daily. Patient not taking: Reported on 04/07/2024 03/26/24   Vicci Bouchard P, DO  Varenicline  Tartrate, Starter, (CHANTIX  STARTING MONTH PAK) 0.5 MG X 11 & 1 MG X 42 TBPK As directed Patient not taking: Reported on 04/07/2024 02/27/24   Vicci Bouchard SQUIBB, DO    Social History   Socioeconomic History   Marital status: Divorced    Spouse name: Not on file   Number of children: Not on file   Years of education: Not on file   Highest education level: Not on file  Occupational History   Not on file  Tobacco Use   Smoking status: Every Day    Current packs/day: 1.00    Types: Cigarettes   Smokeless tobacco: Never  Vaping Use   Vaping status: Never Used  Substance and Sexual Activity   Alcohol use: Not Currently    Alcohol/week: 0.0 standard drinks of alcohol   Drug use: No   Sexual activity: Not Currently  Other Topics Concern   Not on file  Social History Narrative   Not on file   Social Drivers of Health   Financial Resource Strain: Not on file  Food Insecurity: No Food Insecurity (01/31/2024)   Hunger Vital Sign    Worried About Running Out of Food in the Last Year: Never true    Ran Out of Food in the Last Year: Never true  Transportation Needs: Unmet  Transportation Needs (10/28/2023)   PRAPARE - Administrator, Civil Service (Medical): Yes    Lack of Transportation (Non-Medical): Yes  Physical Activity: Not on file  Stress: Not on file  Social Connections: Not on file  Intimate Partner Violence: Not At Risk (01/31/2024)   Humiliation, Afraid, Rape, and Kick questionnaire    Fear of Current or Ex-Partner: No    Emotionally Abused: No    Physically Abused: No    Sexually Abused: No     Family History  Problem Relation Age of Onset   Cancer Mother        uterine, ovarian, and breast   Heart disease Father        MI   Diabetes Brother    Cancer Maternal Grandmother        skin   Cancer Maternal Grandfather        skin    Cancer Paternal Grandmother     ROS: Otherwise negative unless mentioned in HPI  Physical Examination  Vitals:   04/09/24 0439 04/09/24 0725  BP: (!) 92/55 123/63  Pulse: 71 67  Resp: 18 19  Temp: 97.8 F (36.6 C) 97.7 F (36.5 C)  SpO2: 100% 100%   Body mass index is 46.05 kg/m.  General:  WDWN in NAD Gait: Not observed HENT: WNL, normocephalic Pulmonary: normal non-labored breathing, without Rales, rhonchi,  wheezing Cardiac: irregular, Hx of atrial fibrillation, without  Murmurs, rubs or gallops; without carotid bruits Abdomen: Positive bowel sounds throughout, soft, NT/ND, no masses Skin: without rashes Vascular Exam/Pulses: Palpable pulses throughout and all extremities are warm to touch.  Extremities: without ischemic changes, without Gangrene , without cellulitis; without open wounds;  Musculoskeletal: no muscle wasting or atrophy  Neurologic: A&O X 3;  No focal weakness or paresthesias are detected; speech is fluent/normal Psychiatric:  The pt has Normal affect. Lymph:  Unremarkable  CBC    Component Value Date/Time   WBC 7.9 04/08/2024 0412   RBC 3.34 (L) 04/08/2024 0412   HGB 10.5 (L) 04/08/2024 0412   HGB 16.3 02/12/2021 1604   HCT 31.6 (L) 04/08/2024 0412   HCT 47.9 02/12/2021 1604   PLT 281 04/08/2024 0412   PLT 252 02/12/2021 1604   MCV 94.6 04/08/2024 0412   MCV 92 02/12/2021 1604   MCH 31.4 04/08/2024 0412   MCHC 33.2 04/08/2024 0412   RDW 14.1 04/08/2024 0412   RDW 13.2 02/12/2021 1604   LYMPHSABS 2.7 02/12/2021 1604   EOSABS 0.3 02/12/2021 1604   BASOSABS 0.1 02/12/2021 1604    BMET    Component Value Date/Time   NA 141 04/09/2024 0427   NA 141 11/23/2022 1046   K 3.8 04/09/2024 0427   CL 110 04/09/2024 0427   CO2 16 (L) 04/09/2024 0427   GLUCOSE 82 04/09/2024 0427   BUN 139 (H) 04/09/2024 0427   BUN 33 (H) 11/23/2022 1046   CREATININE 17.37 (H) 04/09/2024 0427   CALCIUM  6.2 (LL) 04/09/2024 0427   GFRNONAA 3 (L) 04/09/2024 0427    GFRAA 74 08/03/2019 0813    COAGS: No results found for: INR, PROTIME   Non-Invasive Vascular Imaging:   None Ordered   Statin:  No. Beta Blocker:  Yes.   Aspirin:  No. ACEI:  Yes.   ARB:  No. CCB use:  No Other antiplatelets/anticoagulants:  No.    ASSESSMENT/PLAN: This is a 58 y.o. male who presented to [facility] emergency department with chief current concerns of fatigue and being unable  to eat for the last 5 or 6 days.  Upon workup patient was noted to have a BUN of 148, serum creatinine of 22.29, EGFR 2.  Vascular surgery was consulted for temporary dialysis access placement.  Vascular surgery plans on taking patient to the vascular lab later today on 04/09/2024 for temporary dialysis catheter access placement.  I discussed in detail with the patient at the bedside this morning the procedure, benefits, risk, and complications.  Patient verbalizes understanding wishes to proceed.  Patient has been n.p.o. since midnight last night.  Patient currently is not on any blood thinners.   -I discussed the case in detail with Dr. Selinda Gu MD and he agrees with plan.   [name] [facility] 04/09/2024 9:34 AM

## 2024-04-09 NOTE — Assessment & Plan Note (Signed)
 Likely secondary to advanced renal disease, potassium was 6.8 with EKG changes on presentation. Received medical management.  Potassium at 3.8 today - Monitor potassium -Holding more [place] 

## 2024-04-09 NOTE — Progress Notes (Signed)
 [facility]  ROUNDING NOTE   Subjective:   Patient seen sitting up in bed Continues to have weakness Appetite remains poor  Creatinine 17.37 BUN 139  Objective:  Vital signs in last 24 hours:  Temp:  [97.7 F (36.5 C)-98.9 F (37.2 C)] 98.9 F (37.2 C) (08/11 1104) Pulse Rate:  [67-77] 77 (08/11 1104) Resp:  [18-19] 19 (08/11 1104) BP: (92-123)/(49-63) 103/57 (08/11 1104) SpO2:  [96 %-100 %] 99 % (08/11 1104) FiO2 (%):  [21 %] 21 % (08/10 2225)  Weight change:  Filed Weights   04/07/24 1547  Weight: 133.4 kg    Intake/Output: I/O last 3 completed shifts: In: 2653.2 [P.O.:600; I.V.:1974.5; IV Piggyback:78.6] Out: 250 [Urine:250]   Intake/Output this shift:  No intake/output data recorded.  Physical Exam: General: NAD  Head: Normocephalic, atraumatic. Moist oral mucosal membranes  Eyes: Anicteric  Lungs:  Clear to auscultation, normal effort  Heart: Regular rate and rhythm  Abdomen:  Soft, nontender  Extremities:  No peripheral edema.  Neurologic: Awake, alert, conversant  Skin: Warm,dry, no rash  Access: None    Basic Metabolic Panel: Recent Labs  Lab 04/07/24 1550 04/07/24 1756 04/07/24 2227 04/08/24 0412 04/08/24 1223 04/09/24 0427  NA 138 138 137 139 137 141  K 6.8* 6.3* 4.8 4.4 4.2 3.8  CL 111 111 112* 114* 109 110  CO2 9* 10* 10* 10* 13* 16*  GLUCOSE 139* 103* 109* 81 97 82  BUN 148* 143* 143* 139* 139* 139*  CREATININE 22.29* 21.09* 19.90* 19.31* 18.17* 17.37*  CALCIUM  6.7* 6.6* 6.3* 6.0* 6.4* 6.2*  MG 2.4  --   --  2.0  --   --   PHOS  --   --   --   --  11.9* 11.6*    Liver Function Tests: Recent Labs  Lab 04/07/24 1550 04/07/24 2227 04/08/24 0412 04/08/24 1223 04/09/24 0427  AST 16  --  14* 15  --   ALT 23  --  18 19  --   ALKPHOS 76  --  61 61  --   BILITOT 0.6  --  0.6 0.6  --   PROT 6.1*  --  4.9* 4.8*  --   ALBUMIN  3.1* 2.7* 2.5* 2.5* 2.4*   Recent Labs  Lab 04/07/24 1550  LIPASE 71*   No results for  input(s): AMMONIA in the last 168 hours.  CBC: Recent Labs  Lab 04/07/24 1550 04/08/24 0412  WBC 10.7* 7.9  HGB 12.5* 10.5*  HCT 38.4* 31.6*  MCV 94.8 94.6  PLT 253 281    Cardiac Enzymes: Recent Labs  Lab 04/07/24 1550  CKTOTAL 409*    BNP: Invalid input(s): POCBNP  CBG: No results for input(s): GLUCAP in the last 168 hours.  Microbiology: Results for orders placed or performed during the hospital encounter of 04/07/24  Resp panel by RT-PCR (RSV, Flu A&B, Covid) Anterior Nasal Swab     Status: None   Collection Time: 04/07/24  4:46 PM   Specimen: Anterior Nasal Swab  Result Value Ref Range Status   SARS Coronavirus 2 by RT PCR NEGATIVE NEGATIVE Final    Comment: (NOTE) SARS-CoV-2 target nucleic acids are NOT DETECTED.  The SARS-CoV-2 RNA is generally detectable in upper respiratory specimens during the acute phase of infection. The lowest concentration of SARS-CoV-2 viral copies this assay can detect is 138 copies/mL. A negative result does not preclude SARS-Cov-2 infection and should not be used as the sole basis for treatment or other  patient management decisions. A negative result may occur with  improper specimen collection/handling, submission of specimen other than nasopharyngeal swab, presence of viral mutation(s) within the areas targeted by this assay, and inadequate number of viral copies(<138 copies/mL). A negative result must be combined with clinical observations, patient history, and epidemiological information. The expected result is Negative.  Fact Sheet for Patients:  BloggerCourse.com  Fact Sheet for Healthcare Providers:  SeriousBroker.it  This test is no t yet approved or cleared by the [place]  FDA and  has been authorized for detection and/or diagnosis of SARS-CoV-2 by FDA under an Emergency Use Authorization (EUA). This EUA will remain  in effect (meaning this test can be  used) for the duration of the COVID-19 declaration under Section 564(b)(1) of the Act, 21 U.S.C.section 360bbb-3(b)(1), unless the authorization is terminated  or revoked sooner.       Influenza A by PCR NEGATIVE NEGATIVE Final   Influenza B by PCR NEGATIVE NEGATIVE Final    Comment: (NOTE) The Xpert Xpress SARS-CoV-2/FLU/RSV plus assay is intended as an aid in the diagnosis of influenza from Nasopharyngeal swab specimens and should not be used as a sole basis for treatment. Nasal washings and aspirates are unacceptable for Xpert Xpress SARS-CoV-2/FLU/RSV testing.  Fact Sheet for Patients: BloggerCourse.com  Fact Sheet for Healthcare Providers: SeriousBroker.it  This test is not yet approved or cleared by the [place]  FDA and has been authorized for detection and/or diagnosis of SARS-CoV-2 by FDA under an Emergency Use Authorization (EUA). This EUA will remain in effect (meaning this test can be used) for the duration of the COVID-19 declaration under Section 564(b)(1) of the Act, 21 U.S.C. section 360bbb-3(b)(1), unless the authorization is terminated or revoked.     Resp Syncytial Virus by PCR NEGATIVE NEGATIVE Final    Comment: (NOTE) Fact Sheet for Patients: BloggerCourse.com  Fact Sheet for Healthcare Providers: SeriousBroker.it  This test is not yet approved or cleared by the [place]  FDA and has been authorized for detection and/or diagnosis of SARS-CoV-2 by FDA under an Emergency Use Authorization (EUA). This EUA will remain in effect (meaning this test can be used) for the duration of the COVID-19 declaration under Section 564(b)(1) of the Act, 21 U.S.C. section 360bbb-3(b)(1), unless the authorization is terminated or revoked.  Performed at [facility], [address] [place], [place] [address]     Coagulation Studies: No results for  input(s): LABPROT, INR in the last 72 hours.  Urinalysis: No results for input(s): COLORURINE, LABSPEC, PHURINE, GLUCOSEU, HGBUR, BILIRUBINUR, KETONESUR, PROTEINUR, UROBILINOGEN, NITRITE, LEUKOCYTESUR in the last 72 hours.  Invalid input(s): APPERANCEUR    Imaging: CT ABDOMEN PELVIS WO CONTRAST Result Date: 04/08/2024 EXAM: CT ABDOMEN AND PELVIS WITHOUT CONTRAST 04/08/2024 08:38:50 AM TECHNIQUE: CT of the abdomen and pelvis was performed without the administration of intravenous contrast. Multiplanar reformatted images are provided for review. Automated exposure control, iterative reconstruction, and/or weight based adjustment of the mA/kV was utilized to reduce the radiation dose to as low as reasonably achievable. COMPARISON: Renal ultrasound 04/07/2024. CLINICAL HISTORY: Bowel obstruction suspected. Principal Problem: Renal failure (ARF), acute on chronic (HCC). FINDINGS: LOWER CHEST: No acute abnormality. LIVER: The liver is unremarkable. GALLBLADDER AND BILE DUCTS: Gallbladder is unremarkable. No biliary ductal dilatation. SPLEEN: No acute abnormality. PANCREAS: No acute abnormality. ADRENAL GLANDS: No acute abnormality. KIDNEYS, URETERS AND BLADDER: Simple cysts are again noted in both kidneys measuring up to 3.3 cm at the lower pole of the right kidney and 2.1 cm at  the lower pole of the left kidney. Per consensus, no follow-up is needed for simple Bosniak type 1 and 2 renal cysts, unless the patient has a malignancy history or risk factors. No stones in the kidneys or ureters. No hydronephrosis. No perinephric or periureteral stranding. Urinary bladder is unremarkable. GI AND BOWEL: Stomach demonstrates no acute abnormality. There is no bowel obstruction. No bowel wall thickening. PERITONEUM AND RETROPERITONEUM: No ascites. No free air. VASCULATURE: Minimal atherosclerotic calcifications are present in the aorta. No aneurysm is present. LYMPH NODES: No lymphadenopathy.  REPRODUCTIVE ORGANS: No acute abnormality. BONES AND SOFT TISSUES: Increased density along the inferior aspect of the L2 vertebral body suggests a subacute fracture. MRI of the lumbar spine would be useful for further evaluation. Fat herniates into the right inguinal canal without associated bowel. IMPRESSION: 1. No CT evidence of bowel obstruction. 2. Subacute fracture of the L2 vertebral body. Recommend MRI of the lumbar spine for further evaluation. Electronically signed by: Lonni Necessary MD 04/08/2024 08:53 AM EDT RP Workstation: [id number]   US  Renal Result Date: 04/07/2024 CLINICAL DATA:  Renal failure. EXAM: RENAL / URINARY TRACT ULTRASOUND COMPLETE COMPARISON:  None Available. FINDINGS: Right Kidney: Renal measurements: 8.8 x 5.0 x 5.6 cm = volume: 128 mL. Increased renal parenchymal echogenicity. 4.7 cm simple cyst. No hydronephrosis or solid mass. Left Kidney: Renal measurements: 9.7 x 5.9 x 6.0 cm = volume: 179 mL. Increased renal parenchymal echogenicity. 1.2 cm simple cyst. No hydronephrosis or solid mass. Bladder: Appears normal for degree of bladder distention. Other: None. IMPRESSION: 1. Increased renal parenchymal echogenicity is in keeping with chronic medical renal disease. 2. Bilateral renal cysts. Electronically Signed   By: Newell Eke M.D.   On: 04/07/2024 18:27   DG Chest 2 View Result Date: 04/07/2024 CLINICAL DATA:  Shortness of breath and fatigue.  Anorexia. EXAM: CHEST - 2 VIEW COMPARISON:  11/26/2017 and CT chest 11/20/2009. FINDINGS: Trachea is midline. Heart size normal. Lungs are clear. No pleural fluid. IMPRESSION: No acute findings. Electronically Signed   By: Newell Eke M.D.   On: 04/07/2024 16:33     Medications:    sodium chloride      sodium bicarbonate  150 mEq in sterile water  1,150 mL infusion 50 mL/hr at 04/09/24 0036    buPROPion   300 mg Oral Daily   calcium  carbonate  1 tablet Oral TID WC   heparin   5,000 Units Subcutaneous Q8H   metoprolol   succinate  50 mg Oral Daily   QUEtiapine   25 mg Oral QHS   venlafaxine  XR  150 mg Oral Q breakfast   venlafaxine  XR  75 mg Oral Q breakfast   acetaminophen  **OR** acetaminophen , hydrALAZINE , ipratropium-albuterol , metoprolol  tartrate, nicotine , ondansetron  **OR** ondansetron  (ZOFRAN ) IV, mouth rinse  Assessment/ Plan:  [name] is a 58 y.o.  male with a PMHx of hypertension, atrial fibrillation, BPH, hyperlipidemia, anxiety, constipation, morbid obesity, who was admitted to [facility] on 04/07/2024 for evaluation of fatigue, nausea, poor p.o. intake.   Acute kidney injury with suspected end-stage renal disease. There are several signs pointing to chronicity of renal insufficiency. He has a rather atrophic right kidney measuring 8.8 cm. In addition patient with anemia, severe acute metabolic acidosis, hypocalcemia. These all point to chronicity of disease. Uremic symptoms for several weeks. Extensive serologic workup including SPEP, UPEP, ANA, ANCA antibodies, urine protein to creatinine ratio pending. Would not recommend renal biopsy at this time given high risk for bleeding given uremia.   Requesting HD temp cath from  Vascular to initiate dialysis treatments.  First treatment scheduled for after placement.  Patient will receive 3 consecutive days of dialysis navigator notified of need for outpatient dialysis chair.  Lab Results  Component Value Date   CREATININE 17.37 (H) 04/09/2024   CREATININE 18.17 (H) 04/08/2024   CREATININE 19.31 (H) 04/08/2024    Intake/Output Summary (Last 24 hours) at 04/09/2024 1222 Last data filed at 04/09/2024 0036 Gross per 24 hour  Intake 1137.66 ml  Output 250 ml  Net 887.66 ml   2.  Hyperkalemia.  Serum potassium 3.8.     3.  Severe acute metabolic acidosis. Continue sodium bicarb drip until patient initiated on hemodialysis.    4.  Anemia likely of chronic kidney disease.  Hemoglobin 10.5.  Will assess need for ESA once under 10.SABRA    LOS:  2 Kyerra Vargo 8/11/202512:22 PM

## 2024-04-09 NOTE — TOC CM/SW Note (Signed)
 Transition of Care [facility]) - Inpatient Brief Assessment   Patient Details  Name: Brady Edwards MRN: [id number] Date of Birth: 17-Dec-1965  Transition of Care [facility]) CM/SW Contact:    Lauraine JAYSON Carpen, LCSW Phone Number: 04/09/2024, 2:02 PM   Clinical Narrative: CSW reviewed chart. SDOH flags for housing, utilities, and transportation. Resources added to AVS. No other TOC needs identified so far. CSW will continue to follow progress. Please place [facility] consult if any needs arise.  Transition of Care Asessment: Insurance and Status: Insurance coverage has been reviewed Patient has primary care physician: Yes Home environment has been reviewed: Single family home Prior level of function:: Not documented Prior/Current Home Services: No current home services Social Drivers of Health Review: SDOH reviewed interventions complete Readmission risk has been reviewed: Yes Transition of care needs: no transition of care needs at this time

## 2024-04-09 NOTE — Op Note (Signed)
  OPERATIVE NOTE   PROCEDURE: Ultrasound guidance for vascular access right femoral vein Placement of a 30 cm triple-lumen dialysis catheter right femoral vein  PRE-OPERATIVE DIAGNOSIS: 1. Renal failure   POST-OPERATIVE DIAGNOSIS: Same  SURGEON: Selinda Gu, MD  ASSISTANT(S): None  ANESTHESIA: local  ESTIMATED BLOOD LOSS: Minimal   FINDING(S): 1.  None  SPECIMEN(S):  None  INDICATIONS:    Patient is a 58 y.o.male who presents with renal failure and need for a dialysis catheter for immediate initiation of dialysis.  Risks and benefits were discussed, and informed consent was obtained..  DESCRIPTION: After obtaining full informed written consent, the patient was laid flat in the bed.  The right groin was sterilely prepped and draped in a sterile surgical field was created. The right femoral vein was visualized with ultrasound and found to be widely patent. It was then accessed under direct guidance without difficulty with a Seldinger needle and a permanent image was recorded. A J-wire was then placed. After skin nick and dilatation, a 30 cm triple-lumen dialysis catheter was placed over the wire and the wire was removed. The lumens withdrew dark red nonpulsatile blood and flushed easily with sterile saline. The catheter was secured to the skin with 3 nylon sutures. Sterile dressing was placed.  COMPLICATIONS: None  CONDITION: Stable  Selinda Gu 04/09/2024 5:19 PM  This note was created with Dragon Medical transcription system. Any errors in dictation are purely unintentional.

## 2024-04-09 NOTE — Assessment & Plan Note (Signed)
 With weakness, likely due to poor p.o. intake and appetite with approaching ESRD.  TSH, B12 and CK levels were normal. -PT/OT evaluation

## 2024-04-09 NOTE — Assessment & Plan Note (Addendum)
 Significant known anion gap metabolic acidosis,  Now resolved -Continue to monitor

## 2024-04-09 NOTE — Progress Notes (Signed)
 Progress Note   [name] FMW:[id number] DOB: September 23, 1965 DOA: 04/07/2024     2 DOS: the patient was seen and examined on 04/09/2024   Brief hospital course: Mr. Royer Cristobal is a 58 year old male with history of hypertension, atrial fibrillation on metoprolol  not on anticoagulation, BPH, hyperlipidemia, anxiety, constipation, morbid obesity, who presents emergency department for chief concerns of fatigue, and no p.o. intake for the last 5 to 6-day.  Vitals in the ED showed temperature of 98.4, respiration rate 24, heart rate initially 171, improved to 92, blood pressure initially 98/69, improved to 101/63, SpO2 of 98% on room air.  Serum sodium is 138, potassium 6.8, chloride 111, bicarb 9, BUN of 148, serum creatinine 22.29, EGFR of 2, nonfasting glucose 139, WBC 10.7, hemoglobin 12.5, platelets of 253.  BNP is 12.9.  HS troponin is 21.  ED treatment: DuoNebs, Veltassa  one-time dose, albuterol  nebulizer, Lokelma , calcium  gluconate IV one-time dose, sodium chloride  2 L bolus, sodium bicarbonate .  EDP consulted with nephrology who recommended Lokelma  and Veltassa .  EDP ordered ultrasound of the renal.  8/10: Vitals mostly stable, on room air, renal ultrasound with chronic medical renal disease and bilateral renal cysts, labs with resolution of hyperkalemia, persistent non-anion gap metabolic acidosis with bicarb at 10-received IV bicarb and started on bicarb infusion.  Small improvement in creatinine now at 19.31, persistent hypocalcemia-was given calcium  gluconate.  Parathyroid  and phosphorus levels were ordered-likely secondary to renal failure TSH was normal.  Patient is likely now ESRD and nephrology is planning to start him on dialysis from tomorrow.  8/11: Vital stable, lipid panel mostly normal with low HDL at 25, LDL of 66, persistent hypocalcemia with corrected calcium  at 7.5, significantly elevated phosphorus, small improvement in bicarb and creatinine.  Patient is going  for HD catheter placement followed by starting dialysis.  Assessment and Plan: * Renal failure (ARF), acute on chronic (HCC) Likely ESRD as renal ultrasound with medical renal disease, severely oliguric.  Bladder scan negative. Seems like underlying CKD stage IIIa when checked about a year ago.  No further documented follow-up. - Nephrology is planning to start dialysis today-HD catheter placement by vascular surgery was ordered. -Continue with bicarb infusion -Monitor renal function  Hyperkalemia Likely secondary to advanced renal disease, potassium was 6.8 with EKG changes on presentation. Received medical management.  Potassium at 3.8 today - Monitor potassium -Holding more Veltassa   Hypocalcemia Likely secondary to renal disease. -Calcium  gluconate was given -Phosphorus significantly elevated, parathyroid  levels pending -Monitor calcium -replete as needed  Metabolic acidosis Significant known anion gap metabolic acidosis, some improvement of bicarb , now at 16. -Continue with bicarb infusion -Continue to monitor  Fatigue With weakness, likely due to poor p.o. intake and appetite with approaching ESRD.  TSH, B12 and CK levels were normal. -PT/OT evaluation  Atrial fibrillation, chronic (HCC) Home metoprolol  succinate 50 mg daily resume for 04/08/2024 Metoprolol  tartrate 5 mg IV every 4 hours as needed for heart rate greater than 120, 3 doses ordered Patient not taking anticoagulation  Hypertension Home lisinopril  10 mg daily will not be resumed on admission Hydralazine  5 mg IV every 6 hours as needed for SBP greater 170, 5 days ordered  Depression Home venlafaxine  225 mg p.o. daily before breakfast, bupropion  300 mg daily were resumed  OSA (obstructive sleep apnea) CPAP nightly ordered  Obesity, Class III, BMI 40-49.9 (morbid obesity) Estimated body mass index is 46.05 kg/m as calculated from the following:   Height as of this encounter: 5' 7 (1.702  m).   Weight as  of this encounter: 133.4 kg.   This complicates overall care and prognosis.   Tobacco abuse As needed nicotine  patch ordered   Subjective: Patient was seen and examined today.  Appetite remained poor, no nausea or vomiting.  Physical Exam: Vitals:   04/08/24 2300 04/09/24 0439 04/09/24 0725 04/09/24 1104  BP: 97/63 (!) 92/55 123/63 (!) 103/57  Pulse: 68 71 67 77  Resp: 18 18 19 19   Temp: 97.9 F (36.6 C) 97.8 F (36.6 C) 97.7 F (36.5 C) 98.9 F (37.2 C)  TempSrc: Oral Oral    SpO2: 97% 100% 100% 99%  Weight:      Height:       General.  Morbidly obese gentleman, in no acute distress. Pulmonary.  Lungs clear bilaterally, normal respiratory effort. CV.  Regular rate and rhythm, no JVD, rub or murmur. Abdomen.  Soft, nontender, nondistended, BS positive. CNS.  Alert and oriented .  No focal neurologic deficit. Extremities.  No edema, no cyanosis, pulses intact and symmetrical. Psychiatry.  Judgment and insight appears normal.   Data Reviewed: Prior data reviewed  Family Communication: Discussed with patient  Disposition: Status is: Inpatient Remains inpatient appropriate because: Severity of illness  Planned Discharge Destination: Home  DVT prophylaxis.  Subcu heparin  Time spent: 50 minutes  This record has been created using Conservation officer, historic buildings. Errors have been sought and corrected,but may not always be located. Such creation errors do not reflect on the standard of care.   Author: Amaryllis Dare, MD 04/09/2024 2:38 PM  For on call review [email or web address].

## 2024-04-09 NOTE — Discharge Instructions (Addendum)
 Rent/Utility/Housing  Agency Name: [facility] Agency Address: [address] [place], [place] [address] Phone: [phone] Email: [email or web address] Website: [email or web address] Service(s) Offered: Housing services, self-sufficiency, congregate meal program, weatherization program, Field seismologist program, emergency food assistance,  housing counseling, home ownership program, wheels -towork program.  Agency Name: [facility] Address: [address] [place], [place] [address] Phone: [phone] (8a-4p) [phone] (8p- 10p) Email: [email or web address] Website: [email or web address] Service(s) Offered: A program for homeless and/or needy men that includes one-on-one counseling, life skills training and job rehabilitation.  Agency Name: [facility] [place] Address: [address], [place], [place] [address] Phone: [phone] Website: www.[email or web address].org Service(s) Offered: Assistance to needy in emergency with utility bills, heating fuel, and prescriptions. Shelter for homeless 7pm-7am. December 23, 2016 15  Agency Name: [facility] [place] [facility] Dis[facility] Address: [address], [place], [place] [address] Phone: [phone] Contact Person: Lemond Cart Email: [email or web address] Website: [email or web address] Service(s) Offered: Helps individuals with developmental disabilities move from housing that is more restrictive to homes where they  can achieve greater independence and have more  opportunities.  Agency Name: [facility] Address: [address], [place], [place] [address] Phone: [phone] Email: [email or web address] Website: [email or web address].org Service(s) Offered: Provides affordable housing for low-income families, elderly, and disabled individuals. Offer a wide range of  programs and services, from financial planning to  afterschool and summer programs.  Agency Name: [facility] Address: [address] [place], [place] [address] Phone: [phone] Service(s) Offered: Child support services; child welfare services; food stamps; Medicaid; work first family assistance; and aid with fuel,  rent, food and medicine.  Agency Name: [facility] [place] [employer]. Address: [facility] [address], [place], [place]  [address] Phone: [phone] Website: www.familyabuseservices.org Service(s) Offered: 24 hour Crisis Line: [phone]; 24 hour Emergency Shelter; Transitional Housing; Support Groups; Scientist, physiological; [employer]; Hispanic Outreach: [phone];  Visitation Center: [phone].  Agency Name: [facility] [place]. Address: [address] [place], [place] [address] Phone: [phone] Service(s) Offered: CAP Services; Home and [employer]; Individual or Group Supports; Respite Care Non-Institutional Nursing;  Residential Supports; Respite Care and Personal Care Services; Transportation; Family and Friends Night; Recreational Activities; Three Nutritious Meals/Snacks; Consultation with Registered Dietician; Twenty-four hour Registered Nurse Access; Daily and [employer]; Camp Green Leaves; Dolliver for the [employer] (During Summer Months) Bingo Night (Every  Wednesday Night); Special Populations Dance Night  (Every Tuesday Night); Professional Hair Care Services.  Agency Name: [facility] Address: [address], [place], [place] [address] Phone: [phone] Contact Person: Meade High Website: [email or web address].Physicist, medical) Offered: Residential treatment facility for women; food and  clothing, educational & employment development and  transportation to work; Counsellor of financial skills;  parenting and family reunification; emotional and spiritual  support;  transitional housing for program graduates.  Agency Name: [facility] Address: [address], [place], [place] [address] Phone: [phone] Email: [email or web address] Website: [email or web address] Service(s) Offered: Public housing units for elderly, disabled, and low income people; housing choice vouchers for income eligible  applicants; shelter plus care vouchers; and Psychologist, clinical.  Agency Name: [facility] [employer] Address: [address], [place], [place] [address] Phone: [phone] Email: [email or web address] Website: www.habitatalamance.org Service(s) Offered: Build houses for families in need of decent housing. Each adult in the family must invest 200 hours of labor on  someone else's house, work with volunteers to build their own house, attend classes  on budgeting, home maintenance, yard care, and attend homeowner association meetings.  Agency Name: [facility] Address: [address] [place], [place] [address] Phone: [phone] Website: www.rsli.org Service(s) Offered: Intermediate care facilities for intellectually delayed, Supervised Living in group homes for adults with developmental disabilities, Supervised Living for people who have dual diagnoses (MRMI), Independent Living, Supported Living, respite and a variety of CAP services, pre-vocational services, day supports, and [employer].  Agency Name: [place] [facility] Phone: [phone] Website: www.NCForeclosurePrevention.gov Service(s) Offered: Zero-interest, deferred loans to homeowners struggling to pay their mortgage. Call for more information.   Agency Name: [facility] Agency Address: [address] [place], [place] [address] Phone: [phone] Website: www.alamanceservices.org Service(s) Offered: Housing services, self-sufficiency, congregate meal program, and individual development account  program.  Agency Name: [facility] [place] Address: [address] [place], [place] [address] Phone: [phone] Email: [email or web address] Website: www.[email or web address].org Service(s) Offered: Housing the homeless, feeding the hungry, Company secretary, job and education related services.  Agency Name: [facility] Address: [address] [place], [place] [address] Phone: [phone] Email: [email or web address] Service(s) Offered: Counseling, problem pregnancy, advocacy for Hispanics, limited emergency financial assistance.  Agency Name: Department of Social Services Address: [address] [place], [place] [address] Phone: [phone] Website: [email or web address] Service(s) Offered: Child support services; child welfare services; SNAP; Medicaid; work first family assistance; and aid with fuel,  rent, food and medicine.  Agency Name: Holiday representative Address: [address], [place], [place] [address] Phone: [phone] or [phone] Email: [email or web address] Service(s) Offered: Family services and transient assistance; emergency food, fuel, clothing, limited furniture, utilities; budget counseling, general counseling; give a kid a coat; thrift store; Christmas food and toys. Utility assistance, food pantry, rental  assistance, life sustaining medicine     Transportation Resources for [employer]  Agency Name: [facility] Agency Address: [address] [place], [place] [address] Phone: [phone] Email: [email or web address] Website: [email or web address] Service(s) Offered: Housing services, self-sufficiency, congregate meal program, weatherization program, Field seismologist program, emergency food assistance,  housing counseling, home ownership program, wheels-towork program.  Agency Name: [facility] [employer]  [phone]) Address: [address], [place], [place] [address] Phone: [phone] Website: [email or web address].acta-[email or web address].[email or web address] Service(s) Offered: Transportation for [employer], subscription and demand response; Dial-a-Ride for citizens 47 years of age or older.  Agency Name: Department of Social Services Address: [address] [place], [place] [address] Phone: [phone] Service(s) Offered: Child support services; child welfare services; food stamps; Medicaid; work first family assistance; and aid with fuel,  rent, food and medicine, transportation assistance.  Agency Name: Disabled [employer] (DAV) Transportation  Network Phone: [phone] Service(s) Offered: Transports veterans to the [facility] medical center. Call  forty-eight hours in advance and leave the name, telephone  number, date, and time of appointment. Veteran will be  contacted by the driver the day before the appointment to  arrange a pick up point    [employer] ACTA currently provides door to door services. ACTA connects with PART daily for services to [facility]. ACTA also performs contract services to [employer] operates 27 vehicles, all but 3 mini-vans are equipped with lifts for special needs as well as the general public. ACTA drivers are each CDL certified and trained in First Aid and CPR. ACTA was established in 2002 by [employer]. An independent Industrial/product designer. ACTA operates via Cytogeneticist with  required local 10% match funding from [facility]. ACTA provides over 80,000 passenger trips each year, including [facility] and [employer] sites.  Call at least by 11 AM one business day prior to needing transportation  [employer].                      [place], [place] [address]     Office Hours: Monday-Friday  8 AM - 5 PM

## 2024-04-09 NOTE — Assessment & Plan Note (Addendum)
 Now ESRD as renal ultrasound with medical renal disease, severely oliguric.  Bladder scan negative. Seems like underlying CKD stage IIIa when checked about a year ago.  No further documented follow-up. - Permanent HD catheter was placed today -Most of the nephrology labs include complement, ANA, ANCA and glomerular basement membrane antibodies were negative. -Monitor renal function - Patient had now outpatient dialysis chair starting from Tuesday

## 2024-04-09 NOTE — Assessment & Plan Note (Addendum)
 Likely secondary to renal disease. -Calcium  gluconate was given -Phosphorus significantly elevated, parathyroid  levels elevated likely secondary to renal disease -Monitor calcium -replete as needed

## 2024-04-09 NOTE — Progress Notes (Signed)
 Hemodialysis Note:  Received patient in bed to unit. Alert and oriented. Informed consent singed and in chart.  Treatment initiated: 1620 Treatment completed: 1817  Access used: Right femoral catheter Access issues: None  Patient tolerated well. Transported back to room, alert without acute distress. Report given to patient's RN.  Total UF removed: 0 Medications given: None  Post HD weight: 132.3 kg  [name]

## 2024-04-09 NOTE — Plan of Care (Signed)

## 2024-04-09 NOTE — Progress Notes (Signed)
 Patient stated to RN tonight he is concerned about going home. That he has no power at home currently. Told patient I will write a nurses note to let others know about this issue.

## 2024-04-10 ENCOUNTER — Encounter: Payer: Self-pay | Admitting: Vascular Surgery

## 2024-04-10 DIAGNOSIS — Z4901 Encounter for fitting and adjustment of extracorporeal dialysis catheter: Secondary | ICD-10-CM | POA: Diagnosis not present

## 2024-04-10 DIAGNOSIS — N179 Acute kidney failure, unspecified: Secondary | ICD-10-CM | POA: Diagnosis not present

## 2024-04-10 DIAGNOSIS — D631 Anemia in chronic kidney disease: Secondary | ICD-10-CM | POA: Diagnosis not present

## 2024-04-10 DIAGNOSIS — Z419 Encounter for procedure for purposes other than remedying health state, unspecified: Secondary | ICD-10-CM | POA: Diagnosis not present

## 2024-04-10 DIAGNOSIS — N185 Chronic kidney disease, stage 5: Secondary | ICD-10-CM | POA: Diagnosis not present

## 2024-04-10 DIAGNOSIS — E875 Hyperkalemia: Secondary | ICD-10-CM | POA: Diagnosis not present

## 2024-04-10 DIAGNOSIS — E8721 Acute metabolic acidosis: Secondary | ICD-10-CM | POA: Diagnosis not present

## 2024-04-10 LAB — HEPATITIS B SURFACE ANTIBODY, QUANTITATIVE: Hep B S AB Quant (Post): <3.5 m[IU]/mL — ABNORMAL LOW

## 2024-04-10 LAB — MULTIPLE MYELOMA PANEL, SERUM
Albumin SerPl Elph-Mcnc: 2.4 g/dL — ABNORMAL LOW (ref 2.9–4.4)
Albumin/Glob SerPl: 1.3 (ref 0.7–1.7)
Alpha 1: 0.2 g/dL (ref 0.0–0.4)
Alpha2 Glob SerPl Elph-Mcnc: 0.7 g/dL (ref 0.4–1.0)
B-Globulin SerPl Elph-Mcnc: 0.6 g/dL — ABNORMAL LOW (ref 0.7–1.3)
Gamma Glob SerPl Elph-Mcnc: 0.4 g/dL (ref 0.4–1.8)
Globulin, Total: 2 g/dL — ABNORMAL LOW (ref 2.2–3.9)
IgA: 160 mg/dL (ref 90–386)
IgG (Immunoglobin G), Serum: 402 mg/dL — ABNORMAL LOW (ref 603–1613)
IgM (Immunoglobulin M), Srm: 75 mg/dL (ref 20–172)
Total Protein ELP: 4.4 g/dL — ABNORMAL LOW (ref 6.0–8.5)

## 2024-04-10 LAB — C4 COMPLEMENT: Complement C4, Body Fluid: 26 mg/dL (ref 12–38)

## 2024-04-10 LAB — HEMOGLOBIN A1C
Hgb A1c MFr Bld: 6 % — ABNORMAL HIGH (ref 4.8–5.6)
Mean Plasma Glucose: 126 mg/dL

## 2024-04-10 LAB — C3 COMPLEMENT: C3 Complement: 100 mg/dL (ref 82–167)

## 2024-04-10 LAB — CALCITRIOL (1,25 DI-OH VIT D): Vit D, 1,25-Dihydroxy: 8.1 pg/mL — ABNORMAL LOW (ref 24.8–81.5)

## 2024-04-10 LAB — HEPATITIS B CORE ANTIBODY, TOTAL: HEP B CORE AB: NEGATIVE

## 2024-04-10 MED ORDER — HEPARIN SODIUM (PORCINE) 1000 UNIT/ML IJ SOLN
INTRAMUSCULAR | Status: AC
Start: 2024-04-10 — End: 2024-04-10
  Filled 2024-04-10: qty 4

## 2024-04-10 NOTE — Progress Notes (Signed)
 Progress Note   [name] FMW:[id number] DOB: 1965/10/12 DOA: 04/07/2024     3 DOS: the patient was seen and examined on 04/10/2024   Brief hospital course: [name] is a 58 year old male with history of hypertension, atrial fibrillation on metoprolol  not on anticoagulation, BPH, hyperlipidemia, anxiety, constipation, morbid obesity, who presents emergency department for chief concerns of fatigue, and no p.o. intake for the last 5 to 6-day.  Vitals in the ED showed temperature of 98.4, respiration rate 24, heart rate initially 171, improved to 92, blood pressure initially 98/69, improved to 101/63, SpO2 of 98% on room air.  Serum sodium is 138, potassium 6.8, chloride 111, bicarb 9, BUN of 148, serum creatinine 22.29, EGFR of 2, nonfasting glucose 139, WBC 10.7, hemoglobin 12.5, platelets of 253.  BNP is 12.9.  HS troponin is 21.  ED treatment: DuoNebs, Veltassa  one-time dose, albuterol  nebulizer, Lokelma , calcium  gluconate IV one-time dose, sodium chloride  2 L bolus, sodium bicarbonate .  EDP consulted with nephrology who recommended Lokelma  and Veltassa .  EDP ordered ultrasound of the renal.  8/10: Vitals mostly stable, on room air, renal ultrasound with chronic medical renal disease and bilateral renal cysts, labs with resolution of hyperkalemia, persistent non-anion gap metabolic acidosis with bicarb at 10-received IV bicarb and started on bicarb infusion.  Small improvement in creatinine now at 19.31, persistent hypocalcemia-was given calcium  gluconate.  Parathyroid  and phosphorus levels were ordered-likely secondary to renal failure TSH was normal.  Patient is likely now ESRD and nephrology is planning to start him on dialysis from tomorrow.  8/11: Vital stable, lipid panel mostly normal with low HDL at 25, LDL of 66, persistent hypocalcemia with corrected calcium  at 7.5, significantly elevated phosphorus, small improvement in bicarb and creatinine.  Patient is going  for HD catheter placement followed by starting dialysis.  8/12: Hemodynamically stable, having some nausea, had second session of dialysis today.  Ordered echocardiogram for murmur evaluation.   Assessment and Plan: * Renal failure (ARF), acute on chronic (HCC) Likely ESRD as renal ultrasound with medical renal disease, severely oliguric.  Bladder scan negative. Seems like underlying CKD stage IIIa when checked about a year ago.  No further documented follow-up. - Temporary HD catheter was placed in right femoral region by vascular surgery followed by second session of dialysis today. -Most of the nephrology labs include complement, ANA, ANCA and glomerular basement membrane antibodies were negative. -Continue with bicarb infusion -Monitor renal function -Patient will need outpatient dialysis chair before discharge  Hyperkalemia Likely secondary to advanced renal disease, potassium was 6.8 with EKG changes on presentation. Received medical management.  Potassium at 3.8 today - Monitor potassium -Holding more Veltassa   Hypocalcemia Likely secondary to renal disease. -Calcium  gluconate was given -Phosphorus significantly elevated, parathyroid  levels elevated likely secondary to renal disease -Monitor calcium -replete as needed  Metabolic acidosis Significant known anion gap metabolic acidosis, some improvement of bicarb , now at 16. -Continue with bicarb infusion -Continue to monitor  Fatigue With weakness, likely due to poor p.o. intake and appetite with approaching ESRD.  TSH, B12 and CK levels were normal. -PT/OT evaluation  Atrial fibrillation, chronic (HCC) Home metoprolol  succinate 50 mg daily resume for 04/08/2024 Patient not taking anticoagulation. Echocardiogram was also ordered today for evaluation of a noted murmur and to get the baseline  Hypertension Home lisinopril  10 mg daily will not be resumed on admission Hydralazine  5 mg IV every 6 hours as needed for SBP  greater 170, 5 days ordered  Depression  Home venlafaxine  225 mg p.o. daily before breakfast, bupropion  300 mg daily were resumed  OSA (obstructive sleep apnea) CPAP nightly ordered  Obesity, Class III, BMI 40-49.9 (morbid obesity) Estimated body mass index is 46.05 kg/m as calculated from the following:   Height as of this encounter: 5' 7 (1.702 m).   Weight as of this encounter: 133.4 kg.   This complicates overall care and prognosis.   Tobacco abuse As needed nicotine  patch ordered   Subjective: Patient was seen after the second session of dialysis.  Tolerated the session well.  Having some nausea but no vomiting.  Appetite remained poor.  Physical Exam: Vitals:   04/10/24 1030 04/10/24 1041 04/10/24 1043 04/10/24 1139  BP: (!) 113/94 110/71 111/64 111/66  Pulse: (!) 48 62 71 90  Resp: 19 14 20 19   Temp:   97.9 F (36.6 C) 98.6 F (37 C)  TempSrc:      SpO2:   97% 96%  Weight:   134.8 kg   Height:       General.  Morbidly obese gentleman, in no acute distress. Pulmonary.  Lungs clear bilaterally, normal respiratory effort. CV.  Regular rate and rhythm, positive murmur Abdomen.  Soft, nontender, nondistended, BS positive. CNS.  Alert and oriented .  No focal neurologic deficit. Extremities.  No edema, no cyanosis, pulses intact and symmetrical. Psychiatry.  Judgment and insight appears normal.   Data Reviewed: Prior data reviewed  Family Communication: Discussed with patient  Disposition: Status is: Inpatient Remains inpatient appropriate because: Severity of illness  Planned Discharge Destination: Home  DVT prophylaxis.  Subcu heparin  Time spent: 50 minutes  This record has been created using Conservation officer, historic buildings. Errors have been sought and corrected,but may not always be located. Such creation errors do not reflect on the standard of care.   Author: Amaryllis Dare, MD 04/10/2024 2:02 PM  For on call review [email or web address].

## 2024-04-10 NOTE — Progress Notes (Signed)
 PT Cancellation Note  Patient Details Name: Brady Edwards MRN: [id number] DOB: 01/30/1966   Cancelled Treatment:    Reason Eval/Treat Not Completed: Patient at procedure or test/unavailable, will attempt to see pt at a future date/time as medically appropriate.     CHARM Hamilton Ashika Apuzzo PT, DPT 04/10/24, 8:16 AM

## 2024-04-10 NOTE — Evaluation (Signed)
 Physical Therapy Evaluation Patient Details Name: Brady Edwards MRN: [id number] DOB: 14-Aug-1966 Today's Date: 04/10/2024  History of Present Illness  58 y/o male presented to ED on 04/07/24 for fatigue and poor appetite x 5-6 days. Admitted for renal failure (acute on chronic). S/p temporary R femoral catheter on 8/11. Started on HD. PMH: HTN, Afib, OSA, depression, tobacco abuse  Clinical Impression  Patient admitted with the above. PTA, patient lives alone and was independent, however has not had electricity for unknown time due to being sick and unable to work. Patient continues to function at independent level with no AD after dialysis earlier this date. Patient is at baseline functioning. No further skilled PT needs identified acutely. No PT follow up recommended at this time. PT will sign off.         Equipment Recommendations None recommended by PT  Functional Status Assessment Patient has not had a recent decline in their functional status     Precautions / Restrictions Precautions Precautions: None Recall of Precautions/Restrictions: Intact Restrictions Weight Bearing Restrictions Per Provider Order: No      Mobility  Bed Mobility Overal bed mobility: Independent      Transfers Overall transfer level: Independent Equipment used: None      Ambulation/Gait Ambulation/Gait assistance: Independent Gait Distance (Feet): 200 Feet Assistive device: None Gait Pattern/deviations: WFL(Within Functional Limits)   Gait velocity interpretation: >4.37 ft/sec, indicative of normal walking speed         Balance Overall balance assessment: Independent         Pertinent Vitals/Pain Pain Assessment Pain Assessment: No/denies pain    Home Living Family/patient expects to be discharged to:: Private residence Living Arrangements: Alone   Type of Home: Mobile home Home Access: Stairs to enter Entrance Stairs-Rails: Right Entrance Stairs-Number of Steps: 2-3   Home  Layout: One level Home Equipment: None      Prior Function Prior Level of Function : Independent/Modified Independent     Mobility Comments: lack of electricity for awhile due to sickness and not being able to work       Extremity/Trunk Assessment   Upper Extremity Assessment Upper Extremity Assessment: Overall WFL for tasks assessed    Lower Extremity Assessment Lower Extremity Assessment: Overall WFL for tasks assessed       Communication   Communication Communication: No apparent difficulties    Cognition Arousal: Alert Behavior During Therapy: WFL for tasks assessed/performed   PT - Cognitive impairments: No apparent impairments       Following commands: Intact        PT Assessment Patient does not need any further PT services   PT Goals (Current goals can be found in the Care Plan section)  Acute Rehab PT Goals Patient Stated Goal: to figure out what's wrong PT Goal Formulation: All assessment and education complete, DC therapy     AM-PAC PT 6 Clicks Mobility  Outcome Measure Help needed turning from your back to your side while in a flat bed without using bedrails?: None Help needed moving from lying on your back to sitting on the side of a flat bed without using bedrails?: None Help needed moving to and from a bed to a chair (including a wheelchair)?: None Help needed standing up from a chair using your arms (e.g., wheelchair or bedside chair)?: None Help needed to walk in hospital room?: None Help needed climbing 3-5 steps with a railing? : None 6 Click Score: 24    End of Session   Activity  Tolerance: Patient tolerated treatment well Patient left: in bed;with call bell/phone within reach Nurse Communication: Mobility status PT Visit Diagnosis: Muscle weakness (generalized) (M62.81)    Time: 8491-8482 PT Time Calculation (min) (ACUTE ONLY): 9 min   Charges:   PT Evaluation $PT Eval Low Complexity: 1 Low   PT General Charges $$ ACUTE  PT VISIT: 1 Visit         Maryanne Finder, PT, DPT Physical Therapist - [facility]  [facility]   Karmella Bouvier A Kyndall Chaplin 04/10/2024, 3:37 PM

## 2024-04-10 NOTE — Progress Notes (Signed)
  Progress Note    04/10/2024 10:43 AM 1 Day Post-Op  Subjective: Brady Edwards is a 58 year old male now postop day #1 from:  PROCEDURE: Ultrasound guidance for vascular access right femoral vein Placement of a 30 cm triple-lumen dialysis catheter right femoral vein   Patient is resting comfortably this morning in dialysis receiving hemodialysis.  Nursing reports the temporary catheter in his right groin is working well.  No complications to note.  Patient is alert and oriented this morning and reports the same.   Vitals:   04/10/24 1030 04/10/24 1041  BP: (!) 113/94 110/71  Pulse: (!) 48 62  Resp: 19 14  Temp:    SpO2:     Physical Exam: Cardiac:  Irregular Rate and Rhythm history of atrial fibrillation.  Lungs:  Clear on auscultation with noted rales 1/3 up bilaterally.  Incisions:  Right groin with dressing in place. No complications  Extremities:  All extremities are with palpable pulses and warm to touch.  Abdomen:  Positive bowel sounds throughout, soft, non tender and non distended  Neurologic: AAOX3, answers all questions and follows commands appropriately.   CBC    Component Value Date/Time   WBC 6.6 04/09/2024 1620   RBC 2.89 (L) 04/09/2024 1620   HGB 9.0 (L) 04/09/2024 1620   HGB 16.3 02/12/2021 1604   HCT 26.3 (L) 04/09/2024 1620   HCT 47.9 02/12/2021 1604   PLT 183 04/09/2024 1620   PLT 252 02/12/2021 1604   MCV 91.0 04/09/2024 1620   MCV 92 02/12/2021 1604   MCH 31.1 04/09/2024 1620   MCHC 34.2 04/09/2024 1620   RDW 13.5 04/09/2024 1620   RDW 13.2 02/12/2021 1604   LYMPHSABS 2.7 02/12/2021 1604   EOSABS 0.3 02/12/2021 1604   BASOSABS 0.1 02/12/2021 1604    BMET    Component Value Date/Time   NA 141 04/09/2024 0427   NA 141 11/23/2022 1046   K 3.8 04/09/2024 0427   CL 110 04/09/2024 0427   CO2 16 (L) 04/09/2024 0427   GLUCOSE 82 04/09/2024 0427   BUN 139 (H) 04/09/2024 0427   BUN 33 (H) 11/23/2022 1046   CREATININE 17.37 (H) 04/09/2024  0427   CALCIUM  6.2 (LL) 04/09/2024 0427   GFRNONAA 3 (L) 04/09/2024 0427   GFRAA 74 08/03/2019 0813    INR No results found for: INR   Intake/Output Summary (Last 24 hours) at 04/10/2024 1043 Last data filed at 04/10/2024 0730 Gross per 24 hour  Intake 437 ml  Output 0 ml  Net 437 ml     Assessment/Plan:  58 y.o. male is s/p Temporary dialysis catheter placement  1 Day Post-Op   Plan Okay per vascular surgery to use temporary dialysis catheter. No complications to note. Hemodialysis running well this morning.    DVT prophylaxis:  Heparin  with dialysis   Brady Edwards [facility] 04/10/2024 10:43 AM

## 2024-04-10 NOTE — Plan of Care (Signed)

## 2024-04-10 NOTE — Assessment & Plan Note (Signed)
 Home metoprolol  succinate 50 mg daily resume for 04/08/2024 Patient not taking anticoagulation. Echocardiogram was also ordered today for evaluation of a noted murmur and to get the baseline

## 2024-04-10 NOTE — Progress Notes (Signed)
 [facility]  ROUNDING NOTE   Subjective:   Patient seen and evaluated during dialysis   HEMODIALYSIS FLOWSHEET:  Blood Flow Rate (mL/min): 349 mL/min Arterial Pressure (mmHg): -155.55 mmHg Venous Pressure (mmHg): 168.07 mmHg TMP (mmHg): 0 mmHg Ultrafiltration Rate (mL/min): 278 mL/min Dialysate Flow Rate (mL/min): 300 ml/min  Tolerating treatment well  Objective:  Vital signs in last 24 hours:  Temp:  [98 F (36.7 C)-98.9 F (37.2 C)] 98.3 F (36.8 C) (08/12 0753) Pulse Rate:  [48-78] 62 (08/12 1041) Resp:  [14-21] 14 (08/12 1041) BP: (99-132)/(47-94) 110/71 (08/12 1041) SpO2:  [95 %-99 %] 97 % (08/12 0809) Weight:  [132.3 kg-134.8 kg] 134.8 kg (08/12 0753)  Weight change:  Filed Weights   04/09/24 1603 04/09/24 1817 04/10/24 0753  Weight: 132.3 kg 132.3 kg 134.8 kg    Intake/Output: I/O last 3 completed shifts: In: 890.4 [P.O.:440; I.V.:450.4] Out: 0    Intake/Output this shift:  Total I/O In: 237 [P.O.:237] Out: -   Physical Exam: General: NAD  Head: Normocephalic, atraumatic. Moist oral mucosal membranes  Eyes: Anicteric  Lungs:  Clear to auscultation, normal effort  Heart: Regular rate and rhythm  Abdomen:  Soft, nontender  Extremities:  No peripheral edema.  Neurologic: Awake, alert, conversant  Skin: Warm,dry, no rash  Access: Rt femoral HD temp cath    Basic Metabolic Panel: Recent Labs  Lab 04/07/24 1550 04/07/24 1756 04/07/24 2227 04/08/24 0412 04/08/24 1223 04/09/24 0427  NA 138 138 137 139 137 141  K 6.8* 6.3* 4.8 4.4 4.2 3.8  CL 111 111 112* 114* 109 110  CO2 9* 10* 10* 10* 13* 16*  GLUCOSE 139* 103* 109* 81 97 82  BUN 148* 143* 143* 139* 139* 139*  CREATININE 22.29* 21.09* 19.90* 19.31* 18.17* 17.37*  CALCIUM  6.7* 6.6* 6.3* 6.0* 6.4* 6.2*  MG 2.4  --   --  2.0  --   --   PHOS  --   --   --   --  11.9* 11.6*    Liver Function Tests: Recent Labs  Lab 04/07/24 1550 04/07/24 2227 04/08/24 0412 04/08/24 1223  04/09/24 0427  AST 16  --  14* 15  --   ALT 23  --  18 19  --   ALKPHOS 76  --  61 61  --   BILITOT 0.6  --  0.6 0.6  --   PROT 6.1*  --  4.9* 4.8*  --   ALBUMIN  3.1* 2.7* 2.5* 2.5* 2.4*   Recent Labs  Lab 04/07/24 1550  LIPASE 71*   No results for input(s): AMMONIA in the last 168 hours.  CBC: Recent Labs  Lab 04/07/24 1550 04/08/24 0412 04/09/24 1620  WBC 10.7* 7.9 6.6  HGB 12.5* 10.5* 9.0*  HCT 38.4* 31.6* 26.3*  MCV 94.8 94.6 91.0  PLT 253 281 183    Cardiac Enzymes: Recent Labs  Lab 04/07/24 1550  CKTOTAL 409*    BNP: Invalid input(s): POCBNP  CBG: No results for input(s): GLUCAP in the last 168 hours.  Microbiology: Results for orders placed or performed during the hospital encounter of 04/07/24  Resp panel by RT-PCR (RSV, Flu A&B, Covid) Anterior Nasal Swab     Status: None   Collection Time: 04/07/24  4:46 PM   Specimen: Anterior Nasal Swab  Result Value Ref Range Status   SARS Coronavirus 2 by RT PCR NEGATIVE NEGATIVE Final    Comment: (NOTE) SARS-CoV-2 target nucleic acids are NOT DETECTED.  The SARS-CoV-2  RNA is generally detectable in upper respiratory specimens during the acute phase of infection. The lowest concentration of SARS-CoV-2 viral copies this assay can detect is 138 copies/mL. A negative result does not preclude SARS-Cov-2 infection and should not be used as the sole basis for treatment or other patient management decisions. A negative result may occur with  improper specimen collection/handling, submission of specimen other than nasopharyngeal swab, presence of viral mutation(s) within the areas targeted by this assay, and inadequate number of viral copies(<138 copies/mL). A negative result must be combined with clinical observations, patient history, and epidemiological information. The expected result is Negative.  Fact Sheet for Patients:  BloggerCourse.com  Fact Sheet for Healthcare  Providers:  SeriousBroker.it  This test is no t yet approved or cleared by the [place]  FDA and  has been authorized for detection and/or diagnosis of SARS-CoV-2 by FDA under an Emergency Use Authorization (EUA). This EUA will remain  in effect (meaning this test can be used) for the duration of the COVID-19 declaration under Section 564(b)(1) of the Act, 21 U.S.C.section 360bbb-3(b)(1), unless the authorization is terminated  or revoked sooner.       Influenza A by PCR NEGATIVE NEGATIVE Final   Influenza B by PCR NEGATIVE NEGATIVE Final    Comment: (NOTE) The Xpert Xpress SARS-CoV-2/FLU/RSV plus assay is intended as an aid in the diagnosis of influenza from Nasopharyngeal swab specimens and should not be used as a sole basis for treatment. Nasal washings and aspirates are unacceptable for Xpert Xpress SARS-CoV-2/FLU/RSV testing.  Fact Sheet for Patients: BloggerCourse.com  Fact Sheet for Healthcare Providers: SeriousBroker.it  This test is not yet approved or cleared by the [place]  FDA and has been authorized for detection and/or diagnosis of SARS-CoV-2 by FDA under an Emergency Use Authorization (EUA). This EUA will remain in effect (meaning this test can be used) for the duration of the COVID-19 declaration under Section 564(b)(1) of the Act, 21 U.S.C. section 360bbb-3(b)(1), unless the authorization is terminated or revoked.     Resp Syncytial Virus by PCR NEGATIVE NEGATIVE Final    Comment: (NOTE) Fact Sheet for Patients: BloggerCourse.com  Fact Sheet for Healthcare Providers: SeriousBroker.it  This test is not yet approved or cleared by the [place]  FDA and has been authorized for detection and/or diagnosis of SARS-CoV-2 by FDA under an Emergency Use Authorization (EUA). This EUA will remain in effect (meaning this test can be  used) for the duration of the COVID-19 declaration under Section 564(b)(1) of the Act, 21 U.S.C. section 360bbb-3(b)(1), unless the authorization is terminated or revoked.  Performed at [facility], [address] [place], [place] [address]     Coagulation Studies: No results for input(s): LABPROT, INR in the last 72 hours.  Urinalysis: No results for input(s): COLORURINE, LABSPEC, PHURINE, GLUCOSEU, HGBUR, BILIRUBINUR, KETONESUR, PROTEINUR, UROBILINOGEN, NITRITE, LEUKOCYTESUR in the last 72 hours.  Invalid input(s): APPERANCEUR    Imaging: PERIPHERAL VASCULAR CATHETERIZATION Result Date: 04/09/2024 See surgical note for result.    Medications:      buPROPion   300 mg Oral Daily   calcium  carbonate  1 tablet Oral TID WC   Chlorhexidine  Gluconate Cloth  6 each Topical Q0600   heparin   5,000 Units Subcutaneous Q8H   metoprolol  succinate  50 mg Oral Daily   QUEtiapine   25 mg Oral QHS   venlafaxine  XR  150 mg Oral Q breakfast   venlafaxine  XR  75 mg Oral Q breakfast   acetaminophen  **OR** acetaminophen , hydrALAZINE , ipratropium-albuterol ,  metoprolol  tartrate, nicotine , ondansetron  **OR** ondansetron  (ZOFRAN ) IV, mouth rinse  Assessment/ Plan:  Brady Edwards is a 58 y.o.  male with a PMHx of hypertension, atrial fibrillation, BPH, hyperlipidemia, anxiety, constipation, morbid obesity, who was admitted to [facility] on 04/07/2024 for evaluation of fatigue, nausea, poor p.o. intake.   Acute kidney injury with suspected end-stage renal disease. There are several signs pointing to chronicity of renal insufficiency. He has a rather atrophic right kidney measuring 8.8 cm. In addition patient with anemia, severe acute metabolic acidosis, hypocalcemia. These all point to chronicity of disease. Uremic symptoms for several weeks. Extensive serologic workup including SPEP, UPEP, ANA, ANCA antibodies, urine protein to creatinine ratio pending. Would not  recommend renal biopsy at this time given high risk for bleeding given uremia.  Appreciate vascular surgery placing right femoral HD cath yesterday.  Patient received initial dialysis yesterday, tolerated treatment well.  Second treatment was seen today.  Will schedule next treatment tomorrow.  Outpatient dialysis clinic surgeon progress.  Vascular surgery note placement, likely later this week.  Lab Results  Component Value Date   CREATININE 17.37 (H) 04/09/2024   CREATININE 18.17 (H) 04/08/2024   CREATININE 19.31 (H) 04/08/2024    Intake/Output Summary (Last 24 hours) at 04/10/2024 1045 Last data filed at 04/10/2024 0730 Gross per 24 hour  Intake 437 ml  Output 0 ml  Net 437 ml   2.  Hyperkalemia.  Will continue correction with dialysis.     3.  Severe acute metabolic acidosis.  Has corrected some sodium bicarb drip.  Will stop IV fluids and continue management with dialysis.     4.  Anemia likely of chronic kidney disease.  Hemoglobin 9.0..  Will likely require ESA.  Will continue to monitor.    LOS: 3 Jak Haggar 8/12/202510:45 AM

## 2024-04-11 ENCOUNTER — Inpatient Hospital Stay
Admit: 2024-04-11 | Discharge: 2024-04-11 | Disposition: A | Discharge status: Home / Self Care | Attending: Internal Medicine | Admitting: Internal Medicine

## 2024-04-11 DIAGNOSIS — E8721 Acute metabolic acidosis: Secondary | ICD-10-CM | POA: Diagnosis not present

## 2024-04-11 DIAGNOSIS — E875 Hyperkalemia: Secondary | ICD-10-CM | POA: Diagnosis not present

## 2024-04-11 DIAGNOSIS — D631 Anemia in chronic kidney disease: Secondary | ICD-10-CM | POA: Diagnosis not present

## 2024-04-11 DIAGNOSIS — N179 Acute kidney failure, unspecified: Secondary | ICD-10-CM | POA: Diagnosis not present

## 2024-04-11 DIAGNOSIS — Z4901 Encounter for fitting and adjustment of extracorporeal dialysis catheter: Secondary | ICD-10-CM | POA: Diagnosis not present

## 2024-04-11 DIAGNOSIS — N185 Chronic kidney disease, stage 5: Secondary | ICD-10-CM | POA: Diagnosis not present

## 2024-04-11 LAB — RENAL FUNCTION PANEL
Albumin: 2.5 g/dL — ABNORMAL LOW (ref 3.5–5.0)
Anion gap: 11 (ref 5–15)
BUN: 57 mg/dL — ABNORMAL HIGH (ref 6–20)
CO2: 24 mmol/L (ref 22–32)
Calcium: 6.8 mg/dL — ABNORMAL LOW (ref 8.9–10.3)
Chloride: 103 mmol/L (ref 98–111)
Creatinine, Ser: 7.25 mg/dL — ABNORMAL HIGH (ref 0.61–1.24)
GFR, Estimated: 8 mL/min — ABNORMAL LOW (ref >60)
Glucose, Bld: 91 mg/dL (ref 70–99)
Phosphorus: 5.8 mg/dL — ABNORMAL HIGH (ref 2.5–4.6)
Potassium: 3.3 mmol/L — ABNORMAL LOW (ref 3.5–5.1)
Sodium: 138 mmol/L (ref 135–145)

## 2024-04-11 LAB — CBC
HCT: 28.1 % — ABNORMAL LOW (ref 39.0–52.0)
Hemoglobin: 9.4 g/dL — ABNORMAL LOW (ref 13.0–17.0)
MCH: 30.6 pg (ref 26.0–34.0)
MCHC: 33.5 g/dL (ref 30.0–36.0)
MCV: 91.5 fL (ref 80.0–100.0)
Platelets: 173 K/uL (ref 150–400)
RBC: 3.07 MIL/uL — ABNORMAL LOW (ref 4.22–5.81)
RDW: 12.9 % (ref 11.5–15.5)
WBC: 6 K/uL (ref 4.0–10.5)
nRBC: 0 % (ref 0.0–0.2)

## 2024-04-11 MED ORDER — NEPRO/CARBSTEADY PO LIQD
237.0000 mL | Freq: Three times a day (TID) | ORAL | Status: DC
  Administered 2024-04-11 – 2024-04-12 (×5): 237 mL via ORAL

## 2024-04-11 MED ORDER — RENA-VITE PO TABS
1.0000 | ORAL_TABLET | Freq: Every day | ORAL | Status: DC
  Administered 2024-04-11 – 2024-04-12 (×3): 1 via ORAL
  Filled 2024-04-11 (×2): qty 1

## 2024-04-11 MED ORDER — HEPARIN SODIUM (PORCINE) 1000 UNIT/ML IJ SOLN
INTRAMUSCULAR | Status: AC
  Filled 2024-04-11: qty 4

## 2024-04-11 NOTE — Progress Notes (Signed)
 Progress Note   [name] FMW:[id number] DOB: March 24, 1966 DOA: 04/07/2024     4 DOS: the patient was seen and examined on 04/11/2024   Brief hospital course: [name] is a 58 year old male with history of hypertension, atrial fibrillation on metoprolol  not on anticoagulation, BPH, hyperlipidemia, anxiety, constipation, morbid obesity, who presents emergency department for chief concerns of fatigue, and no p.o. intake for the last 5 to 6-day.  Vitals in the ED showed temperature of 98.4, respiration rate 24, heart rate initially 171, improved to 92, blood pressure initially 98/69, improved to 101/63, SpO2 of 98% on room air.  Serum sodium is 138, potassium 6.8, chloride 111, bicarb 9, BUN of 148, serum creatinine 22.29, EGFR of 2, nonfasting glucose 139, WBC 10.7, hemoglobin 12.5, platelets of 253.  BNP is 12.9.  HS troponin is 21.  ED treatment: DuoNebs, Veltassa  one-time dose, albuterol  nebulizer, Lokelma , calcium  gluconate IV one-time dose, sodium chloride  2 L bolus, sodium bicarbonate .  EDP consulted with nephrology who recommended Lokelma  and Veltassa .  EDP ordered ultrasound of the renal.  8/10: Vitals mostly stable, on room air, renal ultrasound with chronic medical renal disease and bilateral renal cysts, labs with resolution of hyperkalemia, persistent non-anion gap metabolic acidosis with bicarb at 10-received IV bicarb and started on bicarb infusion.  Small improvement in creatinine now at 19.31, persistent hypocalcemia-was given calcium  gluconate.  Parathyroid  and phosphorus levels were ordered-likely secondary to renal failure TSH was normal.  Patient is likely now ESRD and nephrology is planning to start him on dialysis from tomorrow.  8/11: Vital stable, lipid panel mostly normal with low HDL at 25, LDL of 66, persistent hypocalcemia with corrected calcium  at 7.5, significantly elevated phosphorus, small improvement in bicarb and creatinine.  Patient is going  for HD catheter placement followed by starting dialysis.  8/12: Hemodynamically stable, having some nausea, had second session of dialysis today.  Ordered echocardiogram for murmur evaluation.   8/13: Had third session of dialysis today, permanent HD catheter to be placed tomorrow followed by another dialysis on Friday.  Outpatient dialysis chair secured starting from Tuesday  Assessment and Plan: * Renal failure (ARF), acute on chronic (HCC) Now ESRD as renal ultrasound with medical renal disease, severely oliguric.  Bladder scan negative. Seems like underlying CKD stage IIIa when checked about a year ago.  No further documented follow-up. - Temporary HD catheter was placed in right femoral region by vascular surgery followed by third session of dialysis today. -Most of the nephrology labs include complement, ANA, ANCA and glomerular basement membrane antibodies were negative. -Monitor renal function - Patient had now outpatient dialysis chair starting from Tuesday Permanent HD catheter to be placed on Thursday followed by another dialysis on Friday before discharge  Hyperkalemia Likely secondary to advanced renal disease, potassium was 6.8 with EKG changes on presentation. Received medical management.  Potassium at 3.8 today - Monitor potassium -Holding more Veltassa   Hypocalcemia Likely secondary to renal disease. -Calcium  gluconate was given -Phosphorus significantly elevated, parathyroid  levels elevated likely secondary to renal disease -Monitor calcium -replete as needed  Metabolic acidosis Significant known anion gap metabolic acidosis,  Now resolved -Continue to monitor  Fatigue With weakness, likely due to poor p.o. intake and appetite with approaching ESRD.  TSH, B12 and CK levels were normal. -PT/OT evaluation  Atrial fibrillation, chronic (HCC) Home metoprolol  succinate 50 mg daily resume for 04/08/2024 Patient not taking anticoagulation. Echocardiogram was also  ordered today for evaluation of a noted murmur and to  get the baseline  Hypertension Blood pressure now within goal -Continue metoprolol   Depression Home venlafaxine  225 mg p.o. daily before breakfast, bupropion  300 mg daily were resumed  OSA (obstructive sleep apnea) CPAP nightly ordered  Obesity, Class III, BMI 40-49.9 (morbid obesity) Estimated body mass index is 46.05 kg/m as calculated from the following:   Height as of this encounter: 5' 7 (1.702 m).   Weight as of this encounter: 133.4 kg.   This complicates overall care and prognosis.   Tobacco abuse As needed nicotine  patch ordered   Subjective: Patient was seen during dialysis today.  No new concern, nausea seems improving.  Physical Exam: Vitals:   04/11/24 1146 04/11/24 1223 04/11/24 1247 04/11/24 1338  BP: 109/72  120/72 120/72  Pulse: 66  68 68  Resp: 12     Temp: 98.1 F (36.7 C)  98.4 F (36.9 C)   TempSrc: Oral     SpO2: 96%  97%   Weight:  133.5 kg    Height:       General.  Obese gentleman, in no acute distress. Pulmonary.  Lungs clear bilaterally, normal respiratory effort. CV.  Regular rate and rhythm, no JVD, rub or murmur. Abdomen.  Soft, nontender, nondistended, BS positive. CNS.  Alert and oriented .  No focal neurologic deficit. Extremities.  No edema, no cyanosis, pulses intact and symmetrical. Psychiatry.  Judgment and insight appears normal.   Data Reviewed: Prior data reviewed  Family Communication: Discussed with patient  Disposition: Status is: Inpatient Remains inpatient appropriate because: Severity of illness  Planned Discharge Destination: Home  DVT prophylaxis.  Subcu heparin  Time spent: 45 minutes  This record has been created using Conservation officer, historic buildings. Errors have been sought and corrected,but may not always be located. Such creation errors do not reflect on the standard of care.   Author: Amaryllis Dare, MD 04/11/2024 4:15 PM  For on call review  [email or web address].

## 2024-04-11 NOTE — Progress Notes (Signed)
  Echocardiogram 2D Echocardiogram has been performed.  Brady Edwards 04/11/2024, 5:22 PM

## 2024-04-11 NOTE — TOC CM/SW Note (Signed)
 CSW acknowledges consult for not having power at home. Patient is currently off the unit for HD. Utilities resources were added to AVS on 8/11.  Lauraine Carpen, CSW [phone]

## 2024-04-11 NOTE — Progress Notes (Signed)
 Faxing Initial New Patient Dialysis Orders to [facility]. Will assist as needed.  Suzen Satchel Dialysis Navigator [phone]

## 2024-04-11 NOTE — Progress Notes (Signed)
  Received patient in bed to unit.   Informed consent signed and in chart.    TX duration: 2:30 hrs     Transported by  Hand-off given to patient's nurse.    Access used: Right groin Access issues: pt had high venous and arterial pressures. TMP ran hight had to flush circuit to present clotting.   Machine stopped pulling fluid with 33 mins left. Total UF removed: 400 ml Medication(s) given: none Post HD VS: wnl Post HD weight: 133.5 kg     N. Dylana Shaw LPN [facility]

## 2024-04-11 NOTE — Progress Notes (Signed)
 Initial Nutrition Assessment  DOCUMENTATION CODES:   Morbid obesity  INTERVENTION:   -Downgrade diet to dysphagia 3 diet with thin liquids for ease of intake (pt with multiple missing teeth and requesting softer textured foods) -Renal MVI daily -Nepro Shake po TID, each supplement provides 425 kcal and 19 grams protein  -RD provided Nutrition for Dialysis handout from [facility]; attached to AVS/ discharge summary   NUTRITION DIAGNOSIS:   Increased nutrient needs related to chronic illness (ESRD on HD) as evidenced by estimated needs.  GOAL:   Patient will meet greater than or equal to 90% of their needs  MONITOR:   PO intake, Supplement acceptance  REASON FOR ASSESSMENT:   Consult Assessment of nutrition requirement/status, Diet education  ASSESSMENT:   Pt with history of hypertension, atrial fibrillation on metoprolol  not on anticoagulation, BPH, hyperlipidemia, anxiety, constipation, morbid obesity, who presents for chief concerns of fatigue, and no p.o. intake for the last 5 to 6-days.  Pt admitted with renal failure.   8/11- rt femoral HD cath placement, HD started  Reviewed I/O's: +237 ml x 24 hours and +2.8 L since admission  Pt has home HD center placement ([facility]).   Spoke with pt at bedside, who reports not feeling well today. Per pt, he has been tolerating HD treatments well.   Pt has had a poor appetite over the past months, which he attributes to taste changes. Per pt, everything tastes like cardboard of not what it's supposed to taste like. Pt shares that he was eating mainly butterscotch candy over the past month, as that was the only food that had an appealing taste. Pt shares that his favorite snack (chicken in a biscuit and cheese whiz) taste like cardboard and he cannot eat it. He has found that sweeter tasting foods are more appealing.   Per pt, he is also having difficulty with food secondary to multiple  missing teeth and feels like mechanically altered diet would be helpful. RD ordered dinner tray per his request (cottage cheese, fruit, pudding, angel food cake, and vanilla wafers).   Reviewed wt hx; pt has experienced a 5.8% wt loss over the past 6 months, which is not significant for time frame. Pt also with edema, which may be masking true weight loss as well as fat and muscle depletions.   Discussed importance of good meal and supplement intake to promote healing. Pt amenable to try supplements.   Medications reviewed and include calcium  carbonate and effexor .   Labs reviewed: K: 3.3. Phos: 5.8.    NUTRITION - FOCUSED PHYSICAL EXAM:  Flowsheet Row Most Recent Value  Orbital Region No depletion  Upper Arm Region No depletion  Thoracic and Lumbar Region No depletion  Buccal Region No depletion  Temple Region No depletion  Clavicle Bone Region No depletion  Clavicle and Acromion Bone Region No depletion  Scapular Bone Region No depletion  Dorsal Hand No depletion  Patellar Region No depletion  Anterior Thigh Region No depletion  Posterior Calf Region No depletion  Edema (RD Assessment) Mild  Hair Reviewed  Eyes Reviewed  Mouth Reviewed  Skin Reviewed  Nails Reviewed    Diet Order:   Diet Order             DIET DYS 3 Fluid consistency: Thin  Diet effective now                   EDUCATION NEEDS:   Education needs have been addressed  Skin:  Skin Assessment: Reviewed RN Assessment  Last BM:  04/11/24  Height:   Ht Readings from Last 1 Encounters:  04/07/24 5' 7 (1.702 m)    Weight:   Wt Readings from Last 1 Encounters:  04/11/24 133.5 kg    Ideal Body Weight:  67.3 kg  BMI:  Body mass index is 46.1 kg/m.  Estimated Nutritional Needs:   Kcal:  2000-2200  Protein:  100-115 grams  Fluid:  1000 ml + UOP    Margery ORN, RD, LDN, CDCES Registered Dietitian III Certified Diabetes Care and Education Specialist If unable to reach this RD, please  use RD Inpatient group chat on secure chat between hours of 8am-4 pm daily

## 2024-04-11 NOTE — Progress Notes (Signed)
 Outpatient HD referral has been approved for [facility] with chair time 6:30am. Pt was informed at bedside. PT would need to arrive at 6am for their first day of treatment, and would need for someone to take them for their first week of dialysis.    Suzen Satchel Dialysis Navigator [phone]

## 2024-04-11 NOTE — Progress Notes (Signed)
 [facility]  ROUNDING NOTE   Subjective:   Patient to undergo third dialysis treatment today. Reports that he had significant nausea overnight. Search for outpatient dialysis treatment still pending.  Objective:  Vital signs in last 24 hours:  Temp:  [97.9 F (36.6 C)-98.6 F (37 C)] 98.2 F (36.8 C) (08/13 0322) Pulse Rate:  [48-90] 64 (08/13 0322) Resp:  [14-21] 18 (08/13 0322) BP: (99-118)/(59-94) 117/76 (08/13 0322) SpO2:  [92 %-97 %] 93 % (08/13 0322) FiO2 (%):  [21 %] 21 % (08/12 2315) Weight:  [133.9 kg-134.8 kg] 133.9 kg (08/13 0759)  Weight change: 2.5 kg Filed Weights   04/10/24 0753 04/10/24 1043 04/11/24 0759  Weight: 134.8 kg 134.8 kg 133.9 kg    Intake/Output: I/O last 3 completed shifts: In: 437 [P.O.:437] Out: 0    Intake/Output this shift:  No intake/output data recorded.  Physical Exam: General: NAD  Head: Normocephalic, atraumatic. Moist oral mucosal membranes  Eyes: Anicteric  Lungs:  Clear to auscultation, normal effort  Heart: Regular rate and rhythm  Abdomen:  Soft, nontender  Extremities:  No peripheral edema.  Neurologic: Awake, alert, conversant  Skin: Warm,dry, no rash  Access: Rt femoral HD temp cath    Basic Metabolic Panel: Recent Labs  Lab 04/07/24 1550 04/07/24 1756 04/07/24 2227 04/08/24 0412 04/08/24 1223 04/09/24 0427 04/11/24 0427  NA 138   < > 137 139 137 141 138  K 6.8*   < > 4.8 4.4 4.2 3.8 3.3*  CL 111   < > 112* 114* 109 110 103  CO2 9*   < > 10* 10* 13* 16* 24  GLUCOSE 139*   < > 109* 81 97 82 91  BUN 148*   < > 143* 139* 139* 139* 57*  CREATININE 22.29*   < > 19.90* 19.31* 18.17* 17.37* 7.25*  CALCIUM  6.7*   < > 6.3* 6.0* 6.4* 6.2* 6.8*  MG 2.4  --   --  2.0  --   --   --   PHOS  --   --   --   --  11.9* 11.6* 5.8*   < > = values in this interval not displayed.    Liver Function Tests: Recent Labs  Lab 04/07/24 1550 04/07/24 2227 04/08/24 0412 04/08/24 1223 04/09/24 0427  04/11/24 0427  AST 16  --  14* 15  --   --   ALT 23  --  18 19  --   --   ALKPHOS 76  --  61 61  --   --   BILITOT 0.6  --  0.6 0.6  --   --   PROT 6.1*  --  4.9* 4.8*  --   --   ALBUMIN  3.1* 2.7* 2.5* 2.5* 2.4* 2.5*   Recent Labs  Lab 04/07/24 1550  LIPASE 71*   No results for input(s): AMMONIA in the last 168 hours.  CBC: Recent Labs  Lab 04/07/24 1550 04/08/24 0412 04/09/24 1620 04/11/24 0427  WBC 10.7* 7.9 6.6 6.0  HGB 12.5* 10.5* 9.0* 9.4*  HCT 38.4* 31.6* 26.3* 28.1*  MCV 94.8 94.6 91.0 91.5  PLT 253 281 183 173    Cardiac Enzymes: Recent Labs  Lab 04/07/24 1550  CKTOTAL 409*    BNP: Invalid input(s): POCBNP  CBG: No results for input(s): GLUCAP in the last 168 hours.  Microbiology: Results for orders placed or performed during the hospital encounter of 04/07/24  Resp panel by RT-PCR (RSV, Flu A&B, Covid) Anterior  Nasal Swab     Status: None   Collection Time: 04/07/24  4:46 PM   Specimen: Anterior Nasal Swab  Result Value Ref Range Status   SARS Coronavirus 2 by RT PCR NEGATIVE NEGATIVE Final    Comment: (NOTE) SARS-CoV-2 target nucleic acids are NOT DETECTED.  The SARS-CoV-2 RNA is generally detectable in upper respiratory specimens during the acute phase of infection. The lowest concentration of SARS-CoV-2 viral copies this assay can detect is 138 copies/mL. A negative result does not preclude SARS-Cov-2 infection and should not be used as the sole basis for treatment or other patient management decisions. A negative result may occur with  improper specimen collection/handling, submission of specimen other than nasopharyngeal swab, presence of viral mutation(s) within the areas targeted by this assay, and inadequate number of viral copies(<138 copies/mL). A negative result must be combined with clinical observations, patient history, and epidemiological information. The expected result is Negative.  Fact Sheet for Patients:   BloggerCourse.com  Fact Sheet for Healthcare Providers:  SeriousBroker.it  This test is no t yet approved or cleared by the [place]  FDA and  has been authorized for detection and/or diagnosis of SARS-CoV-2 by FDA under an Emergency Use Authorization (EUA). This EUA will remain  in effect (meaning this test can be used) for the duration of the COVID-19 declaration under Section 564(b)(1) of the Act, 21 U.S.C.section 360bbb-3(b)(1), unless the authorization is terminated  or revoked sooner.       Influenza A by PCR NEGATIVE NEGATIVE Final   Influenza B by PCR NEGATIVE NEGATIVE Final    Comment: (NOTE) The Xpert Xpress SARS-CoV-2/FLU/RSV plus assay is intended as an aid in the diagnosis of influenza from Nasopharyngeal swab specimens and should not be used as a sole basis for treatment. Nasal washings and aspirates are unacceptable for Xpert Xpress SARS-CoV-2/FLU/RSV testing.  Fact Sheet for Patients: BloggerCourse.com  Fact Sheet for Healthcare Providers: SeriousBroker.it  This test is not yet approved or cleared by the [place]  FDA and has been authorized for detection and/or diagnosis of SARS-CoV-2 by FDA under an Emergency Use Authorization (EUA). This EUA will remain in effect (meaning this test can be used) for the duration of the COVID-19 declaration under Section 564(b)(1) of the Act, 21 U.S.C. section 360bbb-3(b)(1), unless the authorization is terminated or revoked.     Resp Syncytial Virus by PCR NEGATIVE NEGATIVE Final    Comment: (NOTE) Fact Sheet for Patients: BloggerCourse.com  Fact Sheet for Healthcare Providers: SeriousBroker.it  This test is not yet approved or cleared by the [place]  FDA and has been authorized for detection and/or diagnosis of SARS-CoV-2 by FDA under an Emergency Use  Authorization (EUA). This EUA will remain in effect (meaning this test can be used) for the duration of the COVID-19 declaration under Section 564(b)(1) of the Act, 21 U.S.C. section 360bbb-3(b)(1), unless the authorization is terminated or revoked.  Performed at [facility], [address] [place], [place] [address]     Coagulation Studies: No results for input(s): LABPROT, INR in the last 72 hours.  Urinalysis: No results for input(s): COLORURINE, LABSPEC, PHURINE, GLUCOSEU, HGBUR, BILIRUBINUR, KETONESUR, PROTEINUR, UROBILINOGEN, NITRITE, LEUKOCYTESUR in the last 72 hours.  Invalid input(s): APPERANCEUR    Imaging: PERIPHERAL VASCULAR CATHETERIZATION Result Date: 04/09/2024 See surgical note for result.    Medications:      buPROPion   300 mg Oral Daily   calcium  carbonate  1 tablet Oral TID WC   Chlorhexidine  Gluconate Cloth  6 each  Topical Q0600   heparin   5,000 Units Subcutaneous Q8H   metoprolol  succinate  50 mg Oral Daily   QUEtiapine   25 mg Oral QHS   venlafaxine  XR  150 mg Oral Q breakfast   venlafaxine  XR  75 mg Oral Q breakfast   acetaminophen  **OR** acetaminophen , hydrALAZINE , metoprolol  tartrate, nicotine , ondansetron  **OR** ondansetron  (ZOFRAN ) IV, mouth rinse  Assessment/ Plan:  [name] is a 58 y.o.  male with a PMHx of hypertension, atrial fibrillation, BPH, hyperlipidemia, anxiety, constipation, morbid obesity, who was admitted to [facility] on 04/07/2024 for evaluation of fatigue, nausea, poor p.o. intake.   Acute kidney injury with suspected end-stage renal disease. There are several signs pointing to chronicity of renal insufficiency. He has a rather atrophic right kidney measuring 8.8 cm. In addition patient with anemia, severe acute metabolic acidosis, hypocalcemia. These all point to chronicity of disease. Uremic symptoms for several weeks. Extensive serologic workup including SPEP, UPEP, ANA, ANCA  antibodies, urine protein to creatinine ratio pending. Would not recommend renal biopsy at this time given high risk for bleeding given uremia.  Patient due for third dialysis treatment today.  Azotemia improving.  Still having a bit of nausea but suspect that this will improve with ongoing dialysis treatments.  Lab Results  Component Value Date   CREATININE 7.25 (H) 04/11/2024   CREATININE 17.37 (H) 04/09/2024   CREATININE 18.17 (H) 04/08/2024    Intake/Output Summary (Last 24 hours) at 04/11/2024 0802 Last data filed at 04/10/2024 1900 Gross per 24 hour  Intake 0 ml  Output 0 ml  Net 0 ml   2.  Hyperkalemia.  Serum potassium completely normalized and actually bit low today.  Dialyzed against a 4K bath.   3.  Severe acute metabolic acidosis.  Serum bicarbonate up to 24.   4.  Anemia likely of chronic kidney disease.  Hemoglobin up to 9.4.  Consider use of Mircera as outpatient.  5.  Secondary hyperparathyroidism.  Phosphorus trending down significantly.  Currently down to 5.8.    LOS: 4 Zameria Vogl 8/13/20258:02 AM

## 2024-04-11 NOTE — Assessment & Plan Note (Signed)
 Blood pressure now within goal -Continue metoprolol 

## 2024-04-11 NOTE — Progress Notes (Signed)
 Patient has expressed fear to RN for 2nd night in a row about going back to home without power. Also believes he does not feel better and is worried. Was told by patient no one came to see him today to speak about these issues at home.

## 2024-04-12 ENCOUNTER — Encounter: Payer: Self-pay | Admitting: Vascular Surgery

## 2024-04-12 ENCOUNTER — Encounter
Admission: EM | Disposition: A | Discharge status: Home / Self Care | Payer: Self-pay | Source: Home / Self Care | Attending: Internal Medicine

## 2024-04-12 ENCOUNTER — Telehealth: Payer: Self-pay | Admitting: *Deleted

## 2024-04-12 DIAGNOSIS — E875 Hyperkalemia: Secondary | ICD-10-CM | POA: Diagnosis not present

## 2024-04-12 DIAGNOSIS — N186 End stage renal disease: Secondary | ICD-10-CM

## 2024-04-12 DIAGNOSIS — N185 Chronic kidney disease, stage 5: Secondary | ICD-10-CM | POA: Diagnosis not present

## 2024-04-12 DIAGNOSIS — N179 Acute kidney failure, unspecified: Secondary | ICD-10-CM | POA: Diagnosis not present

## 2024-04-12 DIAGNOSIS — Z4901 Encounter for fitting and adjustment of extracorporeal dialysis catheter: Secondary | ICD-10-CM | POA: Diagnosis not present

## 2024-04-12 DIAGNOSIS — E8721 Acute metabolic acidosis: Secondary | ICD-10-CM | POA: Diagnosis not present

## 2024-04-12 DIAGNOSIS — N19 Unspecified kidney failure: Secondary | ICD-10-CM | POA: Diagnosis not present

## 2024-04-12 DIAGNOSIS — Z9889 Other specified postprocedural states: Secondary | ICD-10-CM

## 2024-04-12 DIAGNOSIS — D631 Anemia in chronic kidney disease: Secondary | ICD-10-CM | POA: Diagnosis not present

## 2024-04-12 HISTORY — PX: DIALYSIS/PERMA CATHETER INSERTION: CATH118288

## 2024-04-12 LAB — ECHOCARDIOGRAM COMPLETE
Area-P 1/2: 3.77 cm2
Height: 67 in
S' Lateral: 2.6 cm
Weight: 4709.03 [oz_av]

## 2024-04-12 SURGERY — DIALYSIS/PERMA CATHETER INSERTION
Anesthesia: Moderate Sedation

## 2024-04-12 MED ORDER — MIDAZOLAM HCL 2 MG/2ML IJ SOLN
INTRAMUSCULAR | Status: AC
  Filled 2024-04-12: qty 2

## 2024-04-12 MED ORDER — MIDAZOLAM HCL 2 MG/2ML IJ SOLN
INTRAMUSCULAR | Status: DC | PRN
Start: 2024-04-12 — End: 2024-04-12
  Administered 2024-04-12: 2 mg via INTRAVENOUS

## 2024-04-12 MED ORDER — METHYLPREDNISOLONE SODIUM SUCC 125 MG IJ SOLR: 125.0000 mg | Freq: Once | INTRAMUSCULAR | Status: DC | PRN

## 2024-04-12 MED ORDER — CEFAZOLIN SODIUM-DEXTROSE 2-4 GM/100ML-% IV SOLN: 2.0000 g | INTRAVENOUS | Status: DC

## 2024-04-12 MED ORDER — CEFAZOLIN SODIUM-DEXTROSE 1-4 GM/50ML-% IV SOLN
1.0000 g | INTRAVENOUS | Status: AC
  Administered 2024-04-12: 1 g via INTRAVENOUS

## 2024-04-12 MED ORDER — LIDOCAINE-EPINEPHRINE (PF) 1 %-1:200000 IJ SOLN
INTRAMUSCULAR | Status: DC | PRN
  Administered 2024-04-12: 10 mL

## 2024-04-12 MED ORDER — FENTANYL CITRATE (PF) 100 MCG/2ML IJ SOLN
INTRAMUSCULAR | Status: DC | PRN
  Administered 2024-04-12: 50 ug via INTRAVENOUS

## 2024-04-12 MED ORDER — FENTANYL CITRATE PF 50 MCG/ML IJ SOSY
PREFILLED_SYRINGE | INTRAMUSCULAR | Status: AC
  Filled 2024-04-12: qty 1

## 2024-04-12 MED ORDER — MIDAZOLAM HCL 2 MG/ML PO SYRP: 8.0000 mg | ORAL_SOLUTION | Freq: Once | ORAL | Status: DC | PRN

## 2024-04-12 MED ORDER — HEPARIN (PORCINE) IN NACL 1000-0.9 UT/500ML-% IV SOLN
INTRAVENOUS | Status: DC | PRN
Start: 2024-04-12 — End: 2024-04-12
  Administered 2024-04-12: 500 mL

## 2024-04-12 MED ORDER — CEFAZOLIN SODIUM-DEXTROSE 1-4 GM/50ML-% IV SOLN
INTRAVENOUS | Status: AC
Start: 2024-04-12 — End: 2024-04-12
  Filled 2024-04-12: qty 50

## 2024-04-12 MED ORDER — DIPHENHYDRAMINE HCL 50 MG/ML IJ SOLN: 50.0000 mg | Freq: Once | INTRAMUSCULAR | Status: DC | PRN

## 2024-04-12 MED ORDER — SODIUM CHLORIDE 0.9 % IV SOLN
INTRAVENOUS | Status: DC
Start: 2024-04-12 — End: 2024-04-12

## 2024-04-12 MED ORDER — HEPARIN SODIUM (PORCINE) 10000 UNIT/ML IJ SOLN
INTRAMUSCULAR | Status: DC | PRN
  Administered 2024-04-12: 10000 [IU]

## 2024-04-12 MED ORDER — FAMOTIDINE 20 MG PO TABS: 40.0000 mg | ORAL_TABLET | Freq: Once | ORAL | Status: DC | PRN

## 2024-04-12 SURGICAL SUPPLY — 6 items
CATH HEMO 15FR 19 SMART SEAL (HEMODIALYSIS SUPPLIES) IMPLANT
COVER PROBE ULTRASOUND 5X96 (MISCELLANEOUS) IMPLANT
DERMABOND ADVANCED .7 DNX12 (GAUZE/BANDAGES/DRESSINGS) IMPLANT
PACK ANGIOGRAPHY (CUSTOM PROCEDURE TRAY) ×1 IMPLANT
SUT MNCRL AB 4-0 PS2 18 (SUTURE) IMPLANT
SUT PROLENE 0 CT 1 30 (SUTURE) IMPLANT

## 2024-04-12 NOTE — Plan of Care (Signed)
   Problem: Education: Goal: Knowledge of General Education information will improve Description: Including pain rating scale, medication(s)/side effects and non-pharmacologic comfort measures Outcome: Progressing   Problem: Health Behavior/Discharge Planning: Goal: Ability to manage health-related needs will improve Outcome: Progressing   Problem: Nutrition: Goal: Adequate nutrition will be maintained Outcome: Progressing   Problem: Elimination: Goal: Will not experience complications related to bowel motility Outcome: Progressing Goal: Will not experience complications related to urinary retention Outcome: Progressing

## 2024-04-12 NOTE — Progress Notes (Signed)
 Mobility Specialist - Progress Note     04/12/24 1518  Mobility  Activity Ambulated independently;Stood at bedside  Level of Assistance Independent  Assistive Device None  Distance Ambulated (ft) 320 ft  Range of Motion/Exercises Active  Activity Response Tolerated well  Mobility Referral Yes  Mobility visit 1 Mobility  Mobility Specialist Start Time (ACUTE ONLY) 1500  Mobility Specialist Stop Time (ACUTE ONLY) 1517  Mobility Specialist Time Calculation (min) (ACUTE ONLY) 17 min   Pt resting in bed on RA upon entry. Pt STS and ambulates to hallway around NS  Indep with no AD. Pt returned to bed and left with needs in reach.   Guido Rumble Mobility Specialist 04/12/24, 3:22 PM

## 2024-04-12 NOTE — Progress Notes (Signed)
 Progress Note   [name] FMW:[id number] DOB: Dec 24, 1965 DOA: 04/07/2024     5 DOS: the patient was seen and examined on 04/12/2024   Brief hospital course: Mr. Macaulay Reicher is a 58 year old male with history of hypertension, atrial fibrillation on metoprolol  not on anticoagulation, BPH, hyperlipidemia, anxiety, constipation, morbid obesity, who presents emergency department for chief concerns of fatigue, and no p.o. intake for the last 5 to 6-day.  Vitals in the ED showed temperature of 98.4, respiration rate 24, heart rate initially 171, improved to 92, blood pressure initially 98/69, improved to 101/63, SpO2 of 98% on room air.  Serum sodium is 138, potassium 6.8, chloride 111, bicarb 9, BUN of 148, serum creatinine 22.29, EGFR of 2, nonfasting glucose 139, WBC 10.7, hemoglobin 12.5, platelets of 253.  BNP is 12.9.  HS troponin is 21.  ED treatment: DuoNebs, Veltassa  one-time dose, albuterol  nebulizer, Lokelma , calcium  gluconate IV one-time dose, sodium chloride  2 L bolus, sodium bicarbonate .  EDP consulted with nephrology who recommended Lokelma  and Veltassa .  EDP ordered ultrasound of the renal.  8/10: Vitals mostly stable, on room air, renal ultrasound with chronic medical renal disease and bilateral renal cysts, labs with resolution of hyperkalemia, persistent non-anion gap metabolic acidosis with bicarb at 10-received IV bicarb and started on bicarb infusion.  Small improvement in creatinine now at 19.31, persistent hypocalcemia-was given calcium  gluconate.  Parathyroid  and phosphorus levels were ordered-likely secondary to renal failure TSH was normal.  Patient is likely now ESRD and nephrology is planning to start him on dialysis from tomorrow.  8/11: Vital stable, lipid panel mostly normal with low HDL at 25, LDL of 66, persistent hypocalcemia with corrected calcium  at 7.5, significantly elevated phosphorus, small improvement in bicarb and creatinine.  Patient is going  for HD catheter placement followed by starting dialysis.  8/12: Hemodynamically stable, having some nausea, had second session of dialysis today.  Ordered echocardiogram for murmur evaluation.   8/13: Had third session of dialysis today, permanent HD catheter to be placed tomorrow followed by another dialysis on Friday.  Outpatient dialysis chair secured starting from Tuesday.  8/14: Hemodynamically stable, had his permanent HD catheter placed by vascular surgery today, will get his dialysis tomorrow morning before discharge, will start TTS schedule from Tuesday.  Assessment and Plan: * Renal failure (ARF), acute on chronic (HCC) Now ESRD as renal ultrasound with medical renal disease, severely oliguric.  Bladder scan negative. Seems like underlying CKD stage IIIa when checked about a year ago.  No further documented follow-up. - Permanent HD catheter was placed today -Most of the nephrology labs include complement, ANA, ANCA and glomerular basement membrane antibodies were negative. -Monitor renal function - Patient had now outpatient dialysis chair starting from Tuesday  Hyperkalemia Likely secondary to advanced renal disease, potassium was 6.8 with EKG changes on presentation. Received medical management.  Potassium at 3.8 today - Monitor potassium -Holding more Veltassa   Hypocalcemia Likely secondary to renal disease. -Calcium  gluconate was given -Phosphorus significantly elevated, parathyroid  levels elevated likely secondary to renal disease -Monitor calcium -replete as needed  Metabolic acidosis Significant known anion gap metabolic acidosis,  Now resolved -Continue to monitor  Fatigue With weakness, likely due to poor p.o. intake and appetite with approaching ESRD.  TSH, B12 and CK levels were normal. -PT/OT evaluation  Atrial fibrillation, chronic (HCC) Home metoprolol  succinate 50 mg daily resume for 04/08/2024 Patient not taking anticoagulation. Echocardiogram was  also ordered today for evaluation of a noted murmur and to get  the baseline  Hypertension Blood pressure now within goal -Continue metoprolol   Depression Home venlafaxine  225 mg p.o. daily before breakfast, bupropion  300 mg daily were resumed  OSA (obstructive sleep apnea) CPAP nightly ordered  Obesity, Class III, BMI 40-49.9 (morbid obesity) Estimated body mass index is 46.05 kg/m as calculated from the following:   Height as of this encounter: 5' 7 (1.702 m).   Weight as of this encounter: 133.4 kg.   This complicates overall care and prognosis.   Tobacco abuse As needed nicotine  patch ordered   Subjective: Patient had his permanent HD catheter placed.  No new concern.  Physical Exam: Vitals:   04/12/24 1145 04/12/24 1200 04/12/24 1216 04/12/24 1548  BP: 108/62 111/65 (!) 123/59 105/71  Pulse: 73 75 69 67  Resp: 16 17    Temp:  98.3 F (36.8 C) 98.2 F (36.8 C) 98.3 F (36.8 C)  TempSrc:  Oral    SpO2: 93% 93% 98% 96%  Weight:      Height:       General.  Obese gentleman, in no acute distress. Pulmonary.  Lungs clear bilaterally, normal respiratory effort. CV.  Regular rate and rhythm, no JVD, rub or murmur. Abdomen.  Soft, nontender, nondistended, BS positive. CNS.  Alert and oriented .  No focal neurologic deficit. Extremities.  No edema, no cyanosis, pulses intact and symmetrical. Psychiatry.  Judgment and insight appears normal.   Data Reviewed: Prior data reviewed  Family Communication: Discussed with patient  Disposition: Status is: Inpatient Remains inpatient appropriate because: Severity of illness  Planned Discharge Destination: Home  DVT prophylaxis.  Subcu heparin  Time spent: 44 minutes  This record has been created using Conservation officer, historic buildings. Errors have been sought and corrected,but may not always be located. Such creation errors do not reflect on the standard of care.   Author: Amaryllis Dare, MD 04/12/2024 3:55 PM  For on  call review [email or web address].

## 2024-04-12 NOTE — Progress Notes (Signed)
  Progress Note    04/12/2024 8:04 AM 3 Days Post-Op  Subjective:  Brady Edwards is a 58 yo male now POD #1 from:   PROCEDURE: Ultrasound guidance for vascular access right femoral vein Placement of a 30 cm triple-lumen dialysis catheter right femoral vein  Vitals:   04/11/24 2257 04/12/24 0356  BP: 108/63 109/62  Pulse: 71 62  Resp: 20 18  Temp: 98.1 F (36.7 C) 98.6 F (37 C)  SpO2: 95% 98%   Physical Exam: Cardiac:  Irregular Rate and Rhythm history of atrial fibrillation.  Lungs:  Clear on auscultation with noted rales 1/3 up bilaterally.  Incisions:  Right groin with dressing in place. No complications  Extremities:  All extremities are with palpable pulses and warm to touch.  Abdomen:  Positive bowel sounds throughout, soft, non tender and non distended  Neurologic: AAOX3, answers all questions and follows commands appropriately.  CBC    Component Value Date/Time   WBC 6.0 04/11/2024 0427   RBC 3.07 (L) 04/11/2024 0427   HGB 9.4 (L) 04/11/2024 0427   HGB 16.3 02/12/2021 1604   HCT 28.1 (L) 04/11/2024 0427   HCT 47.9 02/12/2021 1604   PLT 173 04/11/2024 0427   PLT 252 02/12/2021 1604   MCV 91.5 04/11/2024 0427   MCV 92 02/12/2021 1604   MCH 30.6 04/11/2024 0427   MCHC 33.5 04/11/2024 0427   RDW 12.9 04/11/2024 0427   RDW 13.2 02/12/2021 1604   LYMPHSABS 2.7 02/12/2021 1604   EOSABS 0.3 02/12/2021 1604   BASOSABS 0.1 02/12/2021 1604    BMET    Component Value Date/Time   NA 138 04/11/2024 0427   NA 141 11/23/2022 1046   K 3.3 (L) 04/11/2024 0427   CL 103 04/11/2024 0427   CO2 24 04/11/2024 0427   GLUCOSE 91 04/11/2024 0427   BUN 57 (H) 04/11/2024 0427   BUN 33 (H) 11/23/2022 1046   CREATININE 7.25 (H) 04/11/2024 0427   CALCIUM  6.8 (L) 04/11/2024 0427   GFRNONAA 8 (L) 04/11/2024 0427   GFRAA 74 08/03/2019 0813    INR No results found for: INR   Intake/Output Summary (Last 24 hours) at 04/12/2024 0804 Last data filed at 04/12/2024  0200 Gross per 24 hour  Intake 480 ml  Output 525 ml  Net -45 ml     Assessment/Plan:  58 y.o. male is s/p temporary dialysis catheter placement  3 Days Post-Op   PLAN Vascular surgery plans on taking the patient to the vascular lab today 04/12/24 for placement of a dialysis perma catheter for long term outpatient hemodialysis.  I had a discussion with the patient this morning at the bedside concerning the procedure, benefits, risk, complications.  Patient verbalizes understanding and wishes to proceed.  Patient last ate at 6 PM last night.  He has been n.p.o. since midnight.  He was given 5000 units of heparin  subcu at 606 this morning.  Case was discussed with both Dr. Cordella Shawl MD Dr. Selinda Gu MD and they both agree with the plan  DVT prophylaxis: Heparin  5000 units subcu every 8 hours   Brady Edwards [facility] 04/12/2024 8:04 AM

## 2024-04-12 NOTE — Plan of Care (Signed)

## 2024-04-12 NOTE — Progress Notes (Signed)
 [facility]  ROUNDING NOTE   Subjective:   Patient sitting up in bed NPO for permcath placement Remains weak and fatigued Appetite remains low  Objective:  Vital signs in last 24 hours:  Temp:  [97.9 F (36.6 C)-99.1 F (37.3 C)] 98.2 F (36.8 C) (08/14 1216) Pulse Rate:  [0-78] 69 (08/14 1216) Resp:  [14-20] 17 (08/14 1200) BP: (100-137)/(56-80) 123/59 (08/14 1216) SpO2:  [93 %-100 %] 98 % (08/14 1216) Weight:  [133.5 kg] 133.5 kg (08/14 1050)  Weight change: -0.9 kg Filed Weights   04/11/24 0759 04/11/24 1223 04/12/24 1050  Weight: 133.9 kg 133.5 kg 133.5 kg    Intake/Output: I/O last 3 completed shifts: In: 480 [P.O.:480] Out: 525 [Urine:125; Other:400]   Intake/Output this shift:  No intake/output data recorded.  Physical Exam: General: NAD  Head: Normocephalic, atraumatic. Moist oral mucosal membranes  Eyes: Anicteric  Lungs:  Clear to auscultation, normal effort  Heart: Regular rate and rhythm  Abdomen:  Soft, nontender  Extremities:  No peripheral edema.  Neurologic: Awake, alert, conversant  Skin: Warm,dry, no rash  Access: Rt femoral HD temp cath    Basic Metabolic Panel: Recent Labs  Lab 04/07/24 1550 04/07/24 1756 04/07/24 2227 04/08/24 0412 04/08/24 1223 04/09/24 0427 04/11/24 0427  NA 138   < > 137 139 137 141 138  K 6.8*   < > 4.8 4.4 4.2 3.8 3.3*  CL 111   < > 112* 114* 109 110 103  CO2 9*   < > 10* 10* 13* 16* 24  GLUCOSE 139*   < > 109* 81 97 82 91  BUN 148*   < > 143* 139* 139* 139* 57*  CREATININE 22.29*   < > 19.90* 19.31* 18.17* 17.37* 7.25*  CALCIUM  6.7*   < > 6.3* 6.0* 6.4* 6.2* 6.8*  MG 2.4  --   --  2.0  --   --   --   PHOS  --   --   --   --  11.9* 11.6* 5.8*   < > = values in this interval not displayed.    Liver Function Tests: Recent Labs  Lab 04/07/24 1550 04/07/24 2227 04/08/24 0412 04/08/24 1223 04/09/24 0427 04/11/24 0427  AST 16  --  14* 15  --   --   ALT 23  --  18 19  --   --   ALKPHOS  76  --  61 61  --   --   BILITOT 0.6  --  0.6 0.6  --   --   PROT 6.1*  --  4.9* 4.8*  --   --   ALBUMIN  3.1* 2.7* 2.5* 2.5* 2.4* 2.5*   Recent Labs  Lab 04/07/24 1550  LIPASE 71*   No results for input(s): AMMONIA in the last 168 hours.  CBC: Recent Labs  Lab 04/07/24 1550 04/08/24 0412 04/09/24 1620 04/11/24 0427  WBC 10.7* 7.9 6.6 6.0  HGB 12.5* 10.5* 9.0* 9.4*  HCT 38.4* 31.6* 26.3* 28.1*  MCV 94.8 94.6 91.0 91.5  PLT 253 281 183 173    Cardiac Enzymes: Recent Labs  Lab 04/07/24 1550  CKTOTAL 409*    BNP: Invalid input(s): POCBNP  CBG: No results for input(s): GLUCAP in the last 168 hours.  Microbiology: Results for orders placed or performed during the hospital encounter of 04/07/24  Resp panel by RT-PCR (RSV, Flu A&B, Covid) Anterior Nasal Swab     Status: None   Collection Time: 04/07/24  4:46 PM   Specimen: Anterior Nasal Swab  Result Value Ref Range Status   SARS Coronavirus 2 by RT PCR NEGATIVE NEGATIVE Final    Comment: (NOTE) SARS-CoV-2 target nucleic acids are NOT DETECTED.  The SARS-CoV-2 RNA is generally detectable in upper respiratory specimens during the acute phase of infection. The lowest concentration of SARS-CoV-2 viral copies this assay can detect is 138 copies/mL. A negative result does not preclude SARS-Cov-2 infection and should not be used as the sole basis for treatment or other patient management decisions. A negative result may occur with  improper specimen collection/handling, submission of specimen other than nasopharyngeal swab, presence of viral mutation(s) within the areas targeted by this assay, and inadequate number of viral copies(<138 copies/mL). A negative result must be combined with clinical observations, patient history, and epidemiological information. The expected result is Negative.  Fact Sheet for Patients:  BloggerCourse.com  Fact Sheet for Healthcare Providers:   SeriousBroker.it  This test is no t yet approved or cleared by the [place]  FDA and  has been authorized for detection and/or diagnosis of SARS-CoV-2 by FDA under an Emergency Use Authorization (EUA). This EUA will remain  in effect (meaning this test can be used) for the duration of the COVID-19 declaration under Section 564(b)(1) of the Act, 21 U.S.C.section 360bbb-3(b)(1), unless the authorization is terminated  or revoked sooner.       Influenza A by PCR NEGATIVE NEGATIVE Final   Influenza B by PCR NEGATIVE NEGATIVE Final    Comment: (NOTE) The Xpert Xpress SARS-CoV-2/FLU/RSV plus assay is intended as an aid in the diagnosis of influenza from Nasopharyngeal swab specimens and should not be used as a sole basis for treatment. Nasal washings and aspirates are unacceptable for Xpert Xpress SARS-CoV-2/FLU/RSV testing.  Fact Sheet for Patients: BloggerCourse.com  Fact Sheet for Healthcare Providers: SeriousBroker.it  This test is not yet approved or cleared by the [place]  FDA and has been authorized for detection and/or diagnosis of SARS-CoV-2 by FDA under an Emergency Use Authorization (EUA). This EUA will remain in effect (meaning this test can be used) for the duration of the COVID-19 declaration under Section 564(b)(1) of the Act, 21 U.S.C. section 360bbb-3(b)(1), unless the authorization is terminated or revoked.     Resp Syncytial Virus by PCR NEGATIVE NEGATIVE Final    Comment: (NOTE) Fact Sheet for Patients: BloggerCourse.com  Fact Sheet for Healthcare Providers: SeriousBroker.it  This test is not yet approved or cleared by the [place]  FDA and has been authorized for detection and/or diagnosis of SARS-CoV-2 by FDA under an Emergency Use Authorization (EUA). This EUA will remain in effect (meaning this test can be used) for  the duration of the COVID-19 declaration under Section 564(b)(1) of the Act, 21 U.S.C. section 360bbb-3(b)(1), unless the authorization is terminated or revoked.  Performed at [facility], [address] [place], [place] [address]     Coagulation Studies: No results for input(s): LABPROT, INR in the last 72 hours.  Urinalysis: No results for input(s): COLORURINE, LABSPEC, PHURINE, GLUCOSEU, HGBUR, BILIRUBINUR, KETONESUR, PROTEINUR, UROBILINOGEN, NITRITE, LEUKOCYTESUR in the last 72 hours.  Invalid input(s): APPERANCEUR    Imaging: PERIPHERAL VASCULAR CATHETERIZATION Result Date: 04/12/2024 See surgical note for result.    Medications:      buPROPion   300 mg Oral Daily   calcium  carbonate  1 tablet Oral TID WC   Chlorhexidine  Gluconate Cloth  6 each Topical Q0600   feeding supplement (NEPRO CARB STEADY)  237 mL Oral TID  BM   heparin   5,000 Units Subcutaneous Q8H   metoprolol  succinate  50 mg Oral Daily   multivitamin  1 tablet Oral QHS   QUEtiapine   25 mg Oral QHS   venlafaxine  XR  150 mg Oral Q breakfast   venlafaxine  XR  75 mg Oral Q breakfast   acetaminophen  **OR** acetaminophen , heparin , hydrALAZINE , metoprolol  tartrate, nicotine , ondansetron  **OR** ondansetron  (ZOFRAN ) IV, mouth rinse  Assessment/ Plan:  Mr. RENNY REMER is a 58 y.o.  male with a PMHx of hypertension, atrial fibrillation, BPH, hyperlipidemia, anxiety, constipation, morbid obesity, who was admitted to [facility] on 04/07/2024 for evaluation of fatigue, nausea, poor p.o. intake.   End stage renal disease on hemodialysis.There are several signs pointing to chronicity of renal insufficiency. He has a rather atrophic right kidney measuring 8.8 cm. In addition patient with anemia, severe acute metabolic acidosis, hypocalcemia. These all point to chronicity of disease. Uremic symptoms for several weeks. Extensive serologic workup including SPEP, UPEP, ANA, ANCA antibodies  negative. Urine protein to creatinine ratio pending. Would not recommend renal biopsy at this time given high risk for bleeding given uremia.  Patient has tolerated initial three dialysis treatments. Appreciate vascular placing permcath today. Will place order to remove HD temp cath as it was not functioning well yesterday, very positional. Will use Pemcath with treatment on Friday. Outpatient dialysis clinic confirmed at [facility] on a TTS schedule.   Lab Results  Component Value Date   CREATININE 7.25 (H) 04/11/2024   CREATININE 17.37 (H) 04/09/2024   CREATININE 18.17 (H) 04/08/2024    Intake/Output Summary (Last 24 hours) at 04/12/2024 1242 Last data filed at 04/12/2024 0200 Gross per 24 hour  Intake 480 ml  Output 125 ml  Net 355 ml   2.  Hyperkalemia.  Serum potassium 3.3 prior to dialysis yesterday. . Will continue to monitor with dialysis.   3.  Severe acute metabolic acidosis.  Serum bicarbonate up to 24.   4.  Anemia likely of chronic kidney disease.  Hemoglobin up to 9.4.  Consider use of Mircera as outpatient.  5.  Secondary hyperparathyroidism.  Phosphorus is improving with dialysis. Calcium  is increasing as well.     LOS: 5 Bond Grieshop 8/14/202512:42 PM

## 2024-04-12 NOTE — Op Note (Signed)
 OPERATIVE NOTE    PRE-OPERATIVE DIAGNOSIS: 1. ESRD   POST-OPERATIVE DIAGNOSIS: same as above  PROCEDURE: Ultrasound guidance for vascular access to the right internal jugular vein Fluoroscopic guidance for placement of catheter Placement of a 19 cm tip to cuff tunneled hemodialysis catheter via the right internal jugular vein  SURGEON: Selinda Gu, MD  ANESTHESIA:  Local with Moderate conscious sedation for approximately 22 minutes using 2 mg of Versed  and 50 mcg of Fentanyl   ESTIMATED BLOOD LOSS: 5 cc  FLUORO TIME: less than one minute  CONTRAST: none  FINDING(S): 1.  Patent right internal jugular vein  SPECIMEN(S):  None  INDICATIONS:   Brady Edwards is a 58 y.o.male who presents with renal failure.  The patient needs long term dialysis access for their ESRD, and a Permcath is necessary.  Risks and benefits are discussed and informed consent is obtained.    DESCRIPTION: After obtaining full informed written consent, the patient was brought back to the vascular suited. The patient's right neck and chest were sterilely prepped and draped in a sterile surgical field was created. Moderate conscious sedation was administered during a face to face encounter with the patient throughout the procedure with my supervision of the RN administering medicines and monitoring the patient's vital signs, pulse oximetry, telemetry and mental status throughout from the start of the procedure until the patient was taken to the recovery room.  The right internal jugular vein was visualized with ultrasound and found to be patent. It was then accessed under direct ultrasound guidance and a permanent image was recorded. A wire was placed. After skin nick and dilatation, the peel-away sheath was placed over the wire. I then turned my attention to an area under the clavicle. Approximately 1-2 fingerbreadths below the clavicle a small counterincision was created and tunneled from the subclavicular incision  to the access site. Using fluoroscopic guidance, a 19 centimeter tip to cuff tunneled hemodialysis catheter was selected, and tunneled from the subclavicular incision to the access site. It was then placed through the peel-away sheath and the peel-away sheath was removed. Using fluoroscopic guidance the catheter tips were parked in the right atrium. The appropriate distal connectors were placed. It withdrew blood well and flushed easily with heparinized saline and a concentrated heparin  solution was then placed. It was secured to the chest wall with 2 Prolene sutures. The access incision was closed single 4-0 Monocryl. A 4-0 Monocryl pursestring suture was placed around the exit site. Sterile dressings were placed. The patient tolerated the procedure well and was taken to the recovery room in stable condition.  COMPLICATIONS: None  CONDITION: Stable  Selinda Gu, MD 04/12/2024 11:31 AM   This note was created with Dragon Medical transcription system. Any errors in dictation are purely unintentional.

## 2024-04-13 ENCOUNTER — Other Ambulatory Visit: Payer: Self-pay

## 2024-04-13 DIAGNOSIS — I482 Chronic atrial fibrillation, unspecified: Secondary | ICD-10-CM | POA: Diagnosis not present

## 2024-04-13 DIAGNOSIS — E66813 Obesity, class 3: Secondary | ICD-10-CM | POA: Diagnosis not present

## 2024-04-13 DIAGNOSIS — F32A Depression, unspecified: Secondary | ICD-10-CM | POA: Diagnosis not present

## 2024-04-13 DIAGNOSIS — N401 Enlarged prostate with lower urinary tract symptoms: Secondary | ICD-10-CM

## 2024-04-13 DIAGNOSIS — E872 Acidosis, unspecified: Secondary | ICD-10-CM | POA: Diagnosis not present

## 2024-04-13 DIAGNOSIS — E875 Hyperkalemia: Secondary | ICD-10-CM | POA: Diagnosis not present

## 2024-04-13 DIAGNOSIS — N2581 Secondary hyperparathyroidism of renal origin: Secondary | ICD-10-CM | POA: Diagnosis not present

## 2024-04-13 DIAGNOSIS — I10 Essential (primary) hypertension: Secondary | ICD-10-CM | POA: Diagnosis not present

## 2024-04-13 DIAGNOSIS — D631 Anemia in chronic kidney disease: Secondary | ICD-10-CM | POA: Diagnosis not present

## 2024-04-13 DIAGNOSIS — G4733 Obstructive sleep apnea (adult) (pediatric): Secondary | ICD-10-CM | POA: Diagnosis not present

## 2024-04-13 DIAGNOSIS — R3911 Hesitancy of micturition: Secondary | ICD-10-CM

## 2024-04-13 DIAGNOSIS — R5383 Other fatigue: Secondary | ICD-10-CM | POA: Diagnosis not present

## 2024-04-13 DIAGNOSIS — N179 Acute kidney failure, unspecified: Secondary | ICD-10-CM | POA: Diagnosis not present

## 2024-04-13 DIAGNOSIS — N186 End stage renal disease: Secondary | ICD-10-CM | POA: Diagnosis not present

## 2024-04-13 LAB — RENAL FUNCTION PANEL
Albumin: 3 g/dL — ABNORMAL LOW (ref 3.5–5.0)
Anion gap: 12 (ref 5–15)
BUN: 54 mg/dL — ABNORMAL HIGH (ref 6–20)
CO2: 21 mmol/L — ABNORMAL LOW (ref 22–32)
Calcium: 8.2 mg/dL — ABNORMAL LOW (ref 8.9–10.3)
Chloride: 109 mmol/L (ref 98–111)
Creatinine, Ser: 8.19 mg/dL — ABNORMAL HIGH (ref 0.61–1.24)
GFR, Estimated: 7 mL/min — ABNORMAL LOW (ref >60)
Glucose, Bld: 87 mg/dL (ref 70–99)
Phosphorus: 6.1 mg/dL — ABNORMAL HIGH (ref 2.5–4.6)
Potassium: 3.8 mmol/L (ref 3.5–5.1)
Sodium: 142 mmol/L (ref 135–145)

## 2024-04-13 LAB — CBC
HCT: 31.7 % — ABNORMAL LOW (ref 39.0–52.0)
Hemoglobin: 10.1 g/dL — ABNORMAL LOW (ref 13.0–17.0)
MCH: 30.5 pg (ref 26.0–34.0)
MCHC: 31.9 g/dL (ref 30.0–36.0)
MCV: 95.8 fL (ref 80.0–100.0)
Platelets: 192 K/uL (ref 150–400)
RBC: 3.31 MIL/uL — ABNORMAL LOW (ref 4.22–5.81)
RDW: 13 % (ref 11.5–15.5)
WBC: 8.5 K/uL (ref 4.0–10.5)
nRBC: 0 % (ref 0.0–0.2)

## 2024-04-13 MED ORDER — ALTEPLASE 2 MG IJ SOLR: 2.0000 mg | Freq: Once | INTRAMUSCULAR | Status: DC | PRN

## 2024-04-13 MED ORDER — HEPARIN SODIUM (PORCINE) 1000 UNIT/ML DIALYSIS: 1000.0000 [IU] | INTRAMUSCULAR | Status: DC | PRN

## 2024-04-13 MED ORDER — NEPRO/CARBSTEADY PO LIQD
237.0000 mL | Freq: Three times a day (TID) | ORAL | 2 refills | Status: DC
  Filled 2024-04-13: qty 20000, 28d supply, fill #0

## 2024-04-13 MED ORDER — RENA-VITE PO TABS
1.0000 | ORAL_TABLET | Freq: Every day | ORAL | 2 refills | Status: DC
  Filled 2024-04-13: qty 90, 90d supply, fill #0

## 2024-04-13 MED ORDER — METOPROLOL SUCCINATE ER 50 MG PO TB24
50.0000 mg | ORAL_TABLET | Freq: Every day | ORAL | 2 refills | Status: DC
Start: 2024-04-13 — End: 2024-06-19
  Filled 2024-04-13: qty 30, 30d supply, fill #0

## 2024-04-13 MED ORDER — CALCIUM CARBONATE 1500 (600 CA) MG PO TABS
1.0000 | ORAL_TABLET | Freq: Three times a day (TID) | ORAL | 1 refills | Status: DC
  Filled 2024-04-13: qty 300, 100d supply, fill #0

## 2024-04-13 MED ORDER — HEPARIN SODIUM (PORCINE) 1000 UNIT/ML DIALYSIS
1000.0000 [IU] | INTRAMUSCULAR | Status: DC | PRN
  Administered 2024-04-13: 1000 [IU]

## 2024-04-13 MED ORDER — HEPARIN SODIUM (PORCINE) 1000 UNIT/ML IJ SOLN
INTRAMUSCULAR | Status: AC
  Filled 2024-04-13: qty 5

## 2024-04-13 NOTE — Progress Notes (Signed)
 New Dialysis Start    Patient identified as new dialysis start. Kidney Education packet assembled and given. Discussed the following items with patient:     Current medications and possible changes once started:  Discussed that patient's medications may change over time.  Ex; hypertension medications and diabetes medication.  Nephrologists will adjust as needed.   Fluid restrictions reviewed:  32 oz daily goal:  All liquids count; soups, ice, jello, fruits. Will also refer dietitian.   Phosphorus and potassium: Handout given showing high potassium and phosphorus foods.  Alternative food and drink options given. Will also refer dietitian.   Family support:  Met in HD unit, no family at bedside   Outpatient Clinic Resources:  Discussed roles of Outpatient clinic staff and advised to make a list of needs, if any, to talk with outpatient staff if needed.   Care plan schedule: Informed patient of Care Plans in outpatient setting and to participate in the care plan.  An invitation would be given from outpatient clinic.    Dialysis Access Options:  Reviewed access options with patients. Discussed in detail about care at home with new AVG & AVF. Reviewed checking bruit and thrill. If dialysis catheter present, educated that patient could not take showers.  Catheter dressing changes were to be done by outpatient clinic staff only.    Patient verbalized understanding, had some concerns about home living conditions, SW to speak with pt before discharge. Will continue to round on patient during admission.    Alfonso Sar Dialysis Nurse Coordinator [phone]

## 2024-04-13 NOTE — Progress Notes (Signed)
 Patient will be resuming outpatient clinic at [facility] [place] TTS chair time 6:30am. Clinic  has been called and d/c summary faxed.    Suzen Satchel Dialysis Navigator [email or web address]

## 2024-04-13 NOTE — Discharge Summary (Signed)
 Physician Discharge Summary   [name] MRN: [id number] DOB: 07/29/1966  Admit date:     04/07/2024  Discharge date: 04/13/24  Discharge Physician: Amaryllis Dare   PCP: Vicci Duwaine SQUIBB, DO   Recommendations at discharge:  Patient to start dialysis from Tuesday, on TTS schedule Follow-up with primary care provider Follow-up with nephrology Please discuss with patient to start anticoagulation for stroke prevention with history of atrial fibrillation.  Discharge Diagnoses: Principal Problem:   Renal failure (ARF), acute on chronic (HCC) Active Problems:   Hyperkalemia   Metabolic acidosis   Hypocalcemia   Fatigue   Atrial fibrillation, chronic (HCC)   Hypertension   Depression   OSA (obstructive sleep apnea)   Obesity, Class III, BMI 40-49.9 (morbid obesity)   Tobacco abuse   Panic disorder with agoraphobia   Stress at home   Hyperlipidemia   BPH (benign prostatic hyperplasia)   Encounter for dialysis and dialysis catheter care [facility])   Hospital Course: [name] is a 58 year old male with history of hypertension, atrial fibrillation on metoprolol  not on anticoagulation, BPH, hyperlipidemia, anxiety, constipation, morbid obesity, who presents emergency department for chief concerns of fatigue, and no p.o. intake for the last 5 to 6-day.  Vitals in the ED showed temperature of 98.4, respiration rate 24, heart rate initially 171, improved to 92, blood pressure initially 98/69, improved to 101/63, SpO2 of 98% on room air.  Serum sodium is 138, potassium 6.8, chloride 111, bicarb 9, BUN of 148, serum creatinine 22.29, EGFR of 2, nonfasting glucose 139, WBC 10.7, hemoglobin 12.5, platelets of 253.  BNP is 12.9.  HS troponin is 21.  ED treatment: DuoNebs, Veltassa  one-time dose, albuterol  nebulizer, Lokelma , calcium  gluconate IV one-time dose, sodium chloride  2 L bolus, sodium bicarbonate .  EDP consulted with nephrology who recommended Lokelma  and Veltassa .   EDP ordered ultrasound of the renal.  8/10: Vitals mostly stable, on room air, renal ultrasound with chronic medical renal disease and bilateral renal cysts, labs with resolution of hyperkalemia, persistent non-anion gap metabolic acidosis with bicarb at 10-received IV bicarb and started on bicarb infusion.  Small improvement in creatinine now at 19.31, persistent hypocalcemia-was given calcium  gluconate.  Parathyroid  and phosphorus levels were ordered-likely secondary to renal failure TSH was normal.  Patient is likely now ESRD and nephrology is planning to start him on dialysis from tomorrow.  8/11: Vital stable, lipid panel mostly normal with low HDL at 25, LDL of 66, persistent hypocalcemia with corrected calcium  at 7.5, significantly elevated phosphorus, small improvement in bicarb and creatinine.  Patient is going for HD catheter placement followed by starting dialysis.  8/12: Hemodynamically stable, having some nausea, had second session of dialysis today.  Ordered echocardiogram for murmur evaluation.   8/13: Had third session of dialysis today, permanent HD catheter to be placed tomorrow followed by another dialysis on Friday.  Outpatient dialysis chair secured starting from Tuesday.  8/14: Hemodynamically stable, had his permanent HD catheter placed by vascular surgery today, will get his dialysis tomorrow morning before discharge, will start TTS schedule from Tuesday.  8/15: Remained hemodynamically stable, had his routine dialysis today and he will start his outpatient dialysis on Tuesday.  Blood pressure within goal with metoprolol  only so home lisinopril  was discontinued.  Patient need to discuss with primary care provider and cardiologist regarding anticoagulation for concern of atrial fibrillation.  Patient will continue on current medications and need to have a close follow-up with his providers for further assistance.  Assessment  and Plan: * Renal failure (ARF), acute on chronic  (HCC) Now ESRD as renal ultrasound with medical renal disease, severely oliguric.  Bladder scan negative. Seems like underlying CKD stage IIIa when checked about a year ago.  No further documented follow-up. - Permanent HD catheter was placed today -Most of the nephrology labs include complement, ANA, ANCA and glomerular basement membrane antibodies were negative. -Monitor renal function - Patient had now outpatient dialysis chair starting from Tuesday  Hyperkalemia Likely secondary to advanced renal disease, potassium was 6.8 with EKG changes on presentation. Received medical management.  And now on HD  Hypocalcemia Likely secondary to renal disease. -Calcium  gluconate was given -Phosphorus significantly elevated, parathyroid  levels elevated likely secondary to renal disease -Monitor calcium -replete as needed  Metabolic acidosis Significant known anion gap metabolic acidosis,  Now resolved -Continue to monitor  Fatigue With weakness, likely due to poor p.o. intake and appetite with approaching ESRD.  TSH, B12 and CK levels were normal. -PT/OT evaluation-no follow-up recommendations  Atrial fibrillation, chronic (HCC) Home metoprolol  succinate 50 mg daily resume for 04/08/2024 Patient not taking anticoagulation. Echocardiogram was normal.  Hypertension Blood pressure now within goal -Continue metoprolol   Depression Home venlafaxine  225 mg p.o. daily before breakfast, bupropion  300 mg daily were resumed  OSA (obstructive sleep apnea) CPAP nightly ordered  Obesity, Class III, BMI 40-49.9 (morbid obesity) Estimated body mass index is 46.05 kg/m as calculated from the following:   Height as of this encounter: 5' 7 (1.702 m).   Weight as of this encounter: 133.4 kg.   This complicates overall care and prognosis.   Tobacco abuse As needed nicotine  patch ordered      Consultants: Nephrology Procedures performed: Placement of permanent HD catheter and starting on  hemodialysis Disposition: Home Diet recommendation:  Discharge Diet Orders (From admission, onward)     Start     Ordered   04/13/24 0000  Diet - low sodium heart healthy        04/13/24 1021           Renal diet DISCHARGE MEDICATION: Allergies as of 04/13/2024   No Known Allergies      Medication List     STOP taking these medications    lisinopril  10 MG tablet Commonly known as: ZESTRIL    QUEtiapine  25 MG tablet Commonly known as: SEROQUEL    varenicline  1 MG tablet Commonly known as: Chantix  Continuing Month Pak   Varenicline  Tartrate (Starter) 0.5 MG X 11 & 1 MG X 42 Tbpk Commonly known as: Chantix  Starting Month Pak       TAKE these medications    buPROPion  300 MG 24 hr tablet Commonly known as: WELLBUTRIN  XL Take 1 tablet (300 mg total) by mouth daily.   calcium  carbonate 1250 (500 Ca) MG tablet Commonly known as: OS-CAL - dosed in mg of elemental calcium  Take 1 tablet (1,250 mg total) by mouth 3 (three) times daily with meals.   feeding supplement (NEPRO CARB STEADY) Liqd Take 237 mLs by mouth 3 (three) times daily between meals.   metoprolol  succinate 50 MG 24 hr tablet Commonly known as: TOPROL -XL Take 1 tablet (50 mg total) by mouth daily. Take with or immediately following a meal. What changed: additional instructions   multivitamin Tabs tablet Take 1 tablet by mouth at bedtime.   venlafaxine  XR 150 MG 24 hr capsule Commonly known as: EFFEXOR -XR Take 1 capsule (150 mg total) by mouth daily with breakfast. To be taken with 75mg  for 225mg  daily total  venlafaxine  XR 75 MG 24 hr capsule Commonly known as: EFFEXOR -XR To be taken with 150mg  for 225mg  daily total.        Follow-up Information     Johnson, Megan P, DO. Schedule an appointment as soon as possible for a visit in 1 week(s).   Specialty: Family Medicine Contact information: [address] [place] [address] [phone]                Discharge Exam: Filed Weights    04/12/24 1050 04/13/24 0425 04/13/24 0819  Weight: 133.5 kg 128.7 kg 128.9 kg   General.  Morbidly obese gentleman, in no acute distress. Pulmonary.  Lungs clear bilaterally, normal respiratory effort. CV.  Regular rate and rhythm, no JVD, rub or murmur. Abdomen.  Soft, nontender, nondistended, BS positive. CNS.  Alert and oriented .  No focal neurologic deficit. Extremities.  No edema, no cyanosis, pulses intact and symmetrical. Psychiatry.  Judgment and insight appears normal.   Condition at discharge: stable  The results of significant diagnostics from this hospitalization (including imaging, microbiology, ancillary and laboratory) are listed below for reference.   Imaging Studies: ECHOCARDIOGRAM COMPLETE Result Date: 04/12/2024    ECHOCARDIOGRAM REPORT   Patient Name:   ISAMU TRAMMEL Date of Exam: 04/11/2024 Medical Rec #:  [id number]        Height:       67.0 in Accession #:    [id number]       Weight:       294.3 lb Date of Birth:  1965-10-09        BSA:          2.383 m Patient Age:    57 years         BP:           104/66 mmHg Patient Gender: M                HR:           73 bpm. Exam Location:  Inpatient Procedure: 2D Echo (Both Spectral and Color Flow Doppler were utilized during            procedure). Indications:     murmur  History:         Patient has prior history of Echocardiogram examinations, most                  recent 12/04/2009. Arrythmias:Atrial Fibrillation; Risk                  Factors:Current Smoker, Hypertension, Dyslipidemia and Sleep                  Apnea.  Sonographer:     Tinnie Barefoot RDCS Referring Phys:  [id number] Haji Delaine Diagnosing Phys: Marsa Dooms MD  Sonographer Comments: Patient is obese. Image acquisition challenging due to patient body habitus. IMPRESSIONS  1. Left ventricular ejection fraction, by estimation, is 65 to 70%. The left ventricle has normal function. The left ventricle has no regional wall motion abnormalities. There is mild  concentric left ventricular hypertrophy. Left ventricular diastolic parameters were normal.  2. Right ventricular systolic function is normal. The right ventricular size is normal.  3. The mitral valve is normal in structure. Trivial mitral valve regurgitation. No evidence of mitral stenosis.  4. The aortic valve is normal in structure. Aortic valve regurgitation is not visualized. No aortic stenosis is present.  5. The inferior vena cava is normal in size with greater than  50% respiratory variability, suggesting right atrial pressure of 3 mmHg. FINDINGS  Left Ventricle: Left ventricular ejection fraction, by estimation, is 65 to 70%. The left ventricle has normal function. The left ventricle has no regional wall motion abnormalities. Strain was performed and the global longitudinal strain is indeterminate. The left ventricular internal cavity size was normal in size. There is mild concentric left ventricular hypertrophy. Left ventricular diastolic parameters were normal. Right Ventricle: The right ventricular size is normal. No increase in right ventricular wall thickness. Right ventricular systolic function is normal. Left Atrium: Left atrial size was normal in size. Right Atrium: Right atrial size was normal in size. Pericardium: There is no evidence of pericardial effusion. Mitral Valve: The mitral valve is normal in structure. Trivial mitral valve regurgitation. No evidence of mitral valve stenosis. Tricuspid Valve: The tricuspid valve is normal in structure. Tricuspid valve regurgitation is trivial. No evidence of tricuspid stenosis. Aortic Valve: The aortic valve is normal in structure. Aortic valve regurgitation is not visualized. No aortic stenosis is present. Pulmonic Valve: The pulmonic valve was normal in structure. Pulmonic valve regurgitation is not visualized. No evidence of pulmonic stenosis. Aorta: The aortic root is normal in size and structure. Venous: The inferior vena cava is normal in size with  greater than 50% respiratory variability, suggesting right atrial pressure of 3 mmHg. IAS/Shunts: No atrial level shunt detected by color flow Doppler. Additional Comments: 3D was performed not requiring image post processing on an independent workstation and was indeterminate.  LEFT VENTRICLE PLAX 2D LVIDd:         4.80 cm   Diastology LVIDs:         2.60 cm   LV e' medial:    8.05 cm/s LV PW:         1.20 cm   LV E/e' medial:  10.9 LV IVS:        1.00 cm   LV e' lateral:   11.30 cm/s LVOT diam:     2.30 cm   LV E/e' lateral: 7.7 LVOT Area:     4.15 cm  RIGHT VENTRICLE             IVC RV Basal diam:  3.10 cm     IVC diam: 1.20 cm RV S prime:     18.80 cm/s TAPSE (M-mode): 2.8 cm LEFT ATRIUM           Index LA Vol (A2C): 41.7 ml 17.50 ml/m LA Vol (A4C): 54.7 ml 22.95 ml/m   AORTA Ao Root diam: 3.60 cm Ao Asc diam:  3.30 cm MITRAL VALVE MV Area (PHT): 3.77 cm    SHUNTS MV Decel Time: 201 msec    Systemic Diam: 2.30 cm MV E velocity: 87.40 cm/s MV A velocity: 60.80 cm/s MV E/A ratio:  1.44 Marsa Dooms MD Electronically signed by Marsa Dooms MD Signature Date/Time: 04/12/2024/1:44:01 PM    Final    PERIPHERAL VASCULAR CATHETERIZATION Result Date: 04/12/2024 See surgical note for result.  PERIPHERAL VASCULAR CATHETERIZATION Result Date: 04/09/2024 See surgical note for result.  CT ABDOMEN PELVIS WO CONTRAST Result Date: 04/08/2024 EXAM: CT ABDOMEN AND PELVIS WITHOUT CONTRAST 04/08/2024 08:38:50 AM TECHNIQUE: CT of the abdomen and pelvis was performed without the administration of intravenous contrast. Multiplanar reformatted images are provided for review. Automated exposure control, iterative reconstruction, and/or weight based adjustment of the mA/kV was utilized to reduce the radiation dose to as low as reasonably achievable. COMPARISON: Renal ultrasound 04/07/2024. CLINICAL HISTORY: Bowel obstruction suspected. Principal Problem:  Renal failure (ARF), acute on chronic (HCC). FINDINGS:  LOWER CHEST: No acute abnormality. LIVER: The liver is unremarkable. GALLBLADDER AND BILE DUCTS: Gallbladder is unremarkable. No biliary ductal dilatation. SPLEEN: No acute abnormality. PANCREAS: No acute abnormality. ADRENAL GLANDS: No acute abnormality. KIDNEYS, URETERS AND BLADDER: Simple cysts are again noted in both kidneys measuring up to 3.3 cm at the lower pole of the right kidney and 2.1 cm at the lower pole of the left kidney. Per consensus, no follow-up is needed for simple Bosniak type 1 and 2 renal cysts, unless the patient has a malignancy history or risk factors. No stones in the kidneys or ureters. No hydronephrosis. No perinephric or periureteral stranding. Urinary bladder is unremarkable. GI AND BOWEL: Stomach demonstrates no acute abnormality. There is no bowel obstruction. No bowel wall thickening. PERITONEUM AND RETROPERITONEUM: No ascites. No free air. VASCULATURE: Minimal atherosclerotic calcifications are present in the aorta. No aneurysm is present. LYMPH NODES: No lymphadenopathy. REPRODUCTIVE ORGANS: No acute abnormality. BONES AND SOFT TISSUES: Increased density along the inferior aspect of the L2 vertebral body suggests a subacute fracture. MRI of the lumbar spine would be useful for further evaluation. Fat herniates into the right inguinal canal without associated bowel. IMPRESSION: 1. No CT evidence of bowel obstruction. 2. Subacute fracture of the L2 vertebral body. Recommend MRI of the lumbar spine for further evaluation. Electronically signed by: Lonni Necessary MD 04/08/2024 08:53 AM EDT RP Workstation: [id number]   US  Renal Result Date: 04/07/2024 CLINICAL DATA:  Renal failure. EXAM: RENAL / URINARY TRACT ULTRASOUND COMPLETE COMPARISON:  None Available. FINDINGS: Right Kidney: Renal measurements: 8.8 x 5.0 x 5.6 cm = volume: 128 mL. Increased renal parenchymal echogenicity. 4.7 cm simple cyst. No hydronephrosis or solid mass. Left Kidney: Renal measurements: 9.7 x 5.9 x 6.0  cm = volume: 179 mL. Increased renal parenchymal echogenicity. 1.2 cm simple cyst. No hydronephrosis or solid mass. Bladder: Appears normal for degree of bladder distention. Other: None. IMPRESSION: 1. Increased renal parenchymal echogenicity is in keeping with chronic medical renal disease. 2. Bilateral renal cysts. Electronically Signed   By: Newell Eke M.D.   On: 04/07/2024 18:27   DG Chest 2 View Result Date: 04/07/2024 CLINICAL DATA:  Shortness of breath and fatigue.  Anorexia. EXAM: CHEST - 2 VIEW COMPARISON:  11/26/2017 and CT chest 11/20/2009. FINDINGS: Trachea is midline. Heart size normal. Lungs are clear. No pleural fluid. IMPRESSION: No acute findings. Electronically Signed   By: Newell Eke M.D.   On: 04/07/2024 16:33    Microbiology: Results for orders placed or performed during the hospital encounter of 04/07/24  Resp panel by RT-PCR (RSV, Flu A&B, Covid) Anterior Nasal Swab     Status: None   Collection Time: 04/07/24  4:46 PM   Specimen: Anterior Nasal Swab  Result Value Ref Range Status   SARS Coronavirus 2 by RT PCR NEGATIVE NEGATIVE Final    Comment: (NOTE) SARS-CoV-2 target nucleic acids are NOT DETECTED.  The SARS-CoV-2 RNA is generally detectable in upper respiratory specimens during the acute phase of infection. The lowest concentration of SARS-CoV-2 viral copies this assay can detect is 138 copies/mL. A negative result does not preclude SARS-Cov-2 infection and should not be used as the sole basis for treatment or other patient management decisions. A negative result may occur with  improper specimen collection/handling, submission of specimen other than nasopharyngeal swab, presence of viral mutation(s) within the areas targeted by this assay, and inadequate number of viral copies(<138 copies/mL). A negative result  must be combined with clinical observations, patient history, and epidemiological information. The expected result is Negative.  Fact Sheet  for Patients:  BloggerCourse.com  Fact Sheet for Healthcare Providers:  SeriousBroker.it  This test is no t yet approved or cleared by the [place]  FDA and  has been authorized for detection and/or diagnosis of SARS-CoV-2 by FDA under an Emergency Use Authorization (EUA). This EUA will remain  in effect (meaning this test can be used) for the duration of the COVID-19 declaration under Section 564(b)(1) of the Act, 21 U.S.C.section 360bbb-3(b)(1), unless the authorization is terminated  or revoked sooner.       Influenza A by PCR NEGATIVE NEGATIVE Final   Influenza B by PCR NEGATIVE NEGATIVE Final    Comment: (NOTE) The Xpert Xpress SARS-CoV-2/FLU/RSV plus assay is intended as an aid in the diagnosis of influenza from Nasopharyngeal swab specimens and should not be used as a sole basis for treatment. Nasal washings and aspirates are unacceptable for Xpert Xpress SARS-CoV-2/FLU/RSV testing.  Fact Sheet for Patients: BloggerCourse.com  Fact Sheet for Healthcare Providers: SeriousBroker.it  This test is not yet approved or cleared by the [place]  FDA and has been authorized for detection and/or diagnosis of SARS-CoV-2 by FDA under an Emergency Use Authorization (EUA). This EUA will remain in effect (meaning this test can be used) for the duration of the COVID-19 declaration under Section 564(b)(1) of the Act, 21 U.S.C. section 360bbb-3(b)(1), unless the authorization is terminated or revoked.     Resp Syncytial Virus by PCR NEGATIVE NEGATIVE Final    Comment: (NOTE) Fact Sheet for Patients: BloggerCourse.com  Fact Sheet for Healthcare Providers: SeriousBroker.it  This test is not yet approved or cleared by the [place]  FDA and has been authorized for detection and/or diagnosis of SARS-CoV-2 by FDA under an  Emergency Use Authorization (EUA). This EUA will remain in effect (meaning this test can be used) for the duration of the COVID-19 declaration under Section 564(b)(1) of the Act, 21 U.S.C. section 360bbb-3(b)(1), unless the authorization is terminated or revoked.  Performed at [facility], [address] [place], [place] [address]     Labs: CBC: Recent Labs  Lab 04/07/24 1550 04/08/24 0412 04/09/24 1620 04/11/24 0427 04/13/24 0800  WBC 10.7* 7.9 6.6 6.0 8.5  HGB 12.5* 10.5* 9.0* 9.4* 10.1*  HCT 38.4* 31.6* 26.3* 28.1* 31.7*  MCV 94.8 94.6 91.0 91.5 95.8  PLT 253 281 183 173 192   Basic Metabolic Panel: Recent Labs  Lab 04/07/24 1550 04/07/24 1756 04/08/24 0412 04/08/24 1223 04/09/24 0427 04/11/24 0427 04/13/24 0800  NA 138   < > 139 137 141 138 142  K 6.8*   < > 4.4 4.2 3.8 3.3* 3.8  CL 111   < > 114* 109 110 103 109  CO2 9*   < > 10* 13* 16* 24 21*  GLUCOSE 139*   < > 81 97 82 91 87  BUN 148*   < > 139* 139* 139* 57* 54*  CREATININE 22.29*   < > 19.31* 18.17* 17.37* 7.25* 8.19*  CALCIUM  6.7*   < > 6.0* 6.4* 6.2* 6.8* 8.2*  MG 2.4  --  2.0  --   --   --   --   PHOS  --   --   --  11.9* 11.6* 5.8* 6.1*   < > = values in this interval not displayed.   Liver Function Tests: Recent Labs  Lab 04/07/24 1550 04/07/24 2227 04/08/24 0412 04/08/24  1223 04/09/24 0427 04/11/24 0427 04/13/24 0800  AST 16  --  14* 15  --   --   --   ALT 23  --  18 19  --   --   --   ALKPHOS 76  --  61 61  --   --   --   BILITOT 0.6  --  0.6 0.6  --   --   --   PROT 6.1*  --  4.9* 4.8*  --   --   --   ALBUMIN  3.1*   < > 2.5* 2.5* 2.4* 2.5* 3.0*   < > = values in this interval not displayed.   CBG: No results for input(s): GLUCAP in the last 168 hours.  Discharge time spent: greater than 30 minutes.  This record has been created using Conservation officer, historic buildings. Errors have been sought and corrected,but may not always be located. Such creation errors do not  reflect on the standard of care.   Signed: Amaryllis Dare, MD [facility] 04/13/2024

## 2024-04-13 NOTE — Progress Notes (Signed)
 Entered patient's room to discuss discharge with him. Found Brady Edwards with patient discussing the fact she put resources in his discharge packet. Provided paperwork with same info to patient. Pt appears very upset, cussing and complaining about his treatment in the hospital. Pt stated I don't even know what's actually wrong with me. Nothing was explained to me. I've been asking for Edwards to come see me since I got here and no one ever came. Attempted to discuss the problem with the patient and the patient was unhappy with options and solutions offered by this RN. Pt was cussing and visibly upset and frustrated. Pt stated to this RN I just want to go home.

## 2024-04-13 NOTE — Progress Notes (Signed)
 [facility]  ROUNDING NOTE   Subjective:   Patient seen and evaluated during dialysis   HEMODIALYSIS FLOWSHEET:  Blood Flow Rate (mL/min): 299 mL/min Arterial Pressure (mmHg): -213.93 mmHg Venous Pressure (mmHg): 193.12 mmHg TMP (mmHg): 13.73 mmHg Ultrafiltration Rate (mL/min): 718 mL/min Dialysate Flow Rate (mL/min): 300 ml/min Dialysis Fluid Bolus: Normal Saline  Tolerating treatment well.   Objective:  Vital signs in last 24 hours:  Temp:  [98 F (36.7 C)-98.6 F (37 C)] 98.1 F (36.7 C) (08/15 1314) Pulse Rate:  [65-99] 76 (08/15 1314) Resp:  [17-29] 23 (08/15 1100) BP: (91-125)/(59-89) 109/63 (08/15 1314) SpO2:  [95 %-100 %] 99 % (08/15 1314) Weight:  [127.6 kg-128.9 kg] 127.6 kg (08/15 1323)  Weight change: -0.4 kg Filed Weights   04/13/24 0425 04/13/24 0819 04/13/24 1323  Weight: 128.7 kg 128.9 kg 127.6 kg    Intake/Output: I/O last 3 completed shifts: In: 960 [P.O.:960] Out: 125 [Urine:125]   Intake/Output this shift:  Total I/O In: -  Out: 1 [Other:1]  Physical Exam: General: NAD  Head: Normocephalic, atraumatic. Moist oral mucosal membranes  Eyes: Anicteric  Lungs:  Clear to auscultation, normal effort  Heart: Regular rate and rhythm  Abdomen:  Soft, nontender  Extremities:  No peripheral edema.  Neurologic: Awake, alert, conversant  Skin: Warm,dry, no rash  Access: Rt Permcath     Basic Metabolic Panel: Recent Labs  Lab 04/07/24 1550 04/07/24 1756 04/08/24 0412 04/08/24 1223 04/09/24 0427 04/11/24 0427 04/13/24 0800  NA 138   < > 139 137 141 138 142  K 6.8*   < > 4.4 4.2 3.8 3.3* 3.8  CL 111   < > 114* 109 110 103 109  CO2 9*   < > 10* 13* 16* 24 21*  GLUCOSE 139*   < > 81 97 82 91 87  BUN 148*   < > 139* 139* 139* 57* 54*  CREATININE 22.29*   < > 19.31* 18.17* 17.37* 7.25* 8.19*  CALCIUM  6.7*   < > 6.0* 6.4* 6.2* 6.8* 8.2*  MG 2.4  --  2.0  --   --   --   --   PHOS  --   --   --  11.9* 11.6* 5.8* 6.1*   < > =  values in this interval not displayed.    Liver Function Tests: Recent Labs  Lab 04/07/24 1550 04/07/24 2227 04/08/24 0412 04/08/24 1223 04/09/24 0427 04/11/24 0427 04/13/24 0800  AST 16  --  14* 15  --   --   --   ALT 23  --  18 19  --   --   --   ALKPHOS 76  --  61 61  --   --   --   BILITOT 0.6  --  0.6 0.6  --   --   --   PROT 6.1*  --  4.9* 4.8*  --   --   --   ALBUMIN  3.1*   < > 2.5* 2.5* 2.4* 2.5* 3.0*   < > = values in this interval not displayed.   Recent Labs  Lab 04/07/24 1550  LIPASE 71*   No results for input(s): AMMONIA in the last 168 hours.  CBC: Recent Labs  Lab 04/07/24 1550 04/08/24 0412 04/09/24 1620 04/11/24 0427 04/13/24 0800  WBC 10.7* 7.9 6.6 6.0 8.5  HGB 12.5* 10.5* 9.0* 9.4* 10.1*  HCT 38.4* 31.6* 26.3* 28.1* 31.7*  MCV 94.8 94.6 91.0 91.5 95.8  PLT 253  281 183 173 192    Cardiac Enzymes: Recent Labs  Lab 04/07/24 1550  CKTOTAL 409*    BNP: Invalid input(s): POCBNP  CBG: No results for input(s): GLUCAP in the last 168 hours.  Microbiology: Results for orders placed or performed during the hospital encounter of 04/07/24  Resp panel by RT-PCR (RSV, Flu A&B, Covid) Anterior Nasal Swab     Status: None   Collection Time: 04/07/24  4:46 PM   Specimen: Anterior Nasal Swab  Result Value Ref Range Status   SARS Coronavirus 2 by RT PCR NEGATIVE NEGATIVE Final    Comment: (NOTE) SARS-CoV-2 target nucleic acids are NOT DETECTED.  The SARS-CoV-2 RNA is generally detectable in upper respiratory specimens during the acute phase of infection. The lowest concentration of SARS-CoV-2 viral copies this assay can detect is 138 copies/mL. A negative result does not preclude SARS-Cov-2 infection and should not be used as the sole basis for treatment or other patient management decisions. A negative result may occur with  improper specimen collection/handling, submission of specimen other than nasopharyngeal swab, presence of viral  mutation(s) within the areas targeted by this assay, and inadequate number of viral copies(<138 copies/mL). A negative result must be combined with clinical observations, patient history, and epidemiological information. The expected result is Negative.  Fact Sheet for Patients:  BloggerCourse.com  Fact Sheet for Healthcare Providers:  SeriousBroker.it  This test is no t yet approved or cleared by the [place]  FDA and  has been authorized for detection and/or diagnosis of SARS-CoV-2 by FDA under an Emergency Use Authorization (EUA). This EUA will remain  in effect (meaning this test can be used) for the duration of the COVID-19 declaration under Section 564(b)(1) of the Act, 21 U.S.C.section 360bbb-3(b)(1), unless the authorization is terminated  or revoked sooner.       Influenza A by PCR NEGATIVE NEGATIVE Final   Influenza B by PCR NEGATIVE NEGATIVE Final    Comment: (NOTE) The Xpert Xpress SARS-CoV-2/FLU/RSV plus assay is intended as an aid in the diagnosis of influenza from Nasopharyngeal swab specimens and should not be used as a sole basis for treatment. Nasal washings and aspirates are unacceptable for Xpert Xpress SARS-CoV-2/FLU/RSV testing.  Fact Sheet for Patients: BloggerCourse.com  Fact Sheet for Healthcare Providers: SeriousBroker.it  This test is not yet approved or cleared by the [place]  FDA and has been authorized for detection and/or diagnosis of SARS-CoV-2 by FDA under an Emergency Use Authorization (EUA). This EUA will remain in effect (meaning this test can be used) for the duration of the COVID-19 declaration under Section 564(b)(1) of the Act, 21 U.S.C. section 360bbb-3(b)(1), unless the authorization is terminated or revoked.     Resp Syncytial Virus by PCR NEGATIVE NEGATIVE Final    Comment: (NOTE) Fact Sheet for  Patients: BloggerCourse.com  Fact Sheet for Healthcare Providers: SeriousBroker.it  This test is not yet approved or cleared by the [place]  FDA and has been authorized for detection and/or diagnosis of SARS-CoV-2 by FDA under an Emergency Use Authorization (EUA). This EUA will remain in effect (meaning this test can be used) for the duration of the COVID-19 declaration under Section 564(b)(1) of the Act, 21 U.S.C. section 360bbb-3(b)(1), unless the authorization is terminated or revoked.  Performed at [facility], [address] [place], [place] [address]     Coagulation Studies: No results for input(s): LABPROT, INR in the last 72 hours.  Urinalysis: No results for input(s): COLORURINE, LABSPEC, PHURINE, GLUCOSEU, HGBUR, BILIRUBINUR, KETONESUR,  PROTEINUR, UROBILINOGEN, NITRITE, LEUKOCYTESUR in the last 72 hours.  Invalid input(s): APPERANCEUR    Imaging: ECHOCARDIOGRAM COMPLETE Result Date: 04/12/2024    ECHOCARDIOGRAM REPORT   Patient Name:   Brady Edwards Date of Exam: 04/11/2024 Medical Rec #:  [id number]        Height:       67.0 in Accession #:    [id number]       Weight:       294.3 lb Date of Birth:  Apr 03, 1966        BSA:          2.383 m Patient Age:    57 years         BP:           104/66 mmHg Patient Gender: M                HR:           73 bpm. Exam Location:  Inpatient Procedure: 2D Echo (Both Spectral and Color Flow Doppler were utilized during            procedure). Indications:     murmur  History:         Patient has prior history of Echocardiogram examinations, most                  recent 12/04/2009. Arrythmias:Atrial Fibrillation; Risk                  Factors:Current Smoker, Hypertension, Dyslipidemia and Sleep                  Apnea.  Sonographer:     Tinnie Barefoot RDCS Referring Phys:  [id number] SUMAYYA AMIN Diagnosing Phys: Marsa Dooms MD  Sonographer  Comments: Patient is obese. Image acquisition challenging due to patient body habitus. IMPRESSIONS  1. Left ventricular ejection fraction, by estimation, is 65 to 70%. The left ventricle has normal function. The left ventricle has no regional wall motion abnormalities. There is mild concentric left ventricular hypertrophy. Left ventricular diastolic parameters were normal.  2. Right ventricular systolic function is normal. The right ventricular size is normal.  3. The mitral valve is normal in structure. Trivial mitral valve regurgitation. No evidence of mitral stenosis.  4. The aortic valve is normal in structure. Aortic valve regurgitation is not visualized. No aortic stenosis is present.  5. The inferior vena cava is normal in size with greater than 50% respiratory variability, suggesting right atrial pressure of 3 mmHg. FINDINGS  Left Ventricle: Left ventricular ejection fraction, by estimation, is 65 to 70%. The left ventricle has normal function. The left ventricle has no regional wall motion abnormalities. Strain was performed and the global longitudinal strain is indeterminate. The left ventricular internal cavity size was normal in size. There is mild concentric left ventricular hypertrophy. Left ventricular diastolic parameters were normal. Right Ventricle: The right ventricular size is normal. No increase in right ventricular wall thickness. Right ventricular systolic function is normal. Left Atrium: Left atrial size was normal in size. Right Atrium: Right atrial size was normal in size. Pericardium: There is no evidence of pericardial effusion. Mitral Valve: The mitral valve is normal in structure. Trivial mitral valve regurgitation. No evidence of mitral valve stenosis. Tricuspid Valve: The tricuspid valve is normal in structure. Tricuspid valve regurgitation is trivial. No evidence of tricuspid stenosis. Aortic Valve: The aortic valve is normal in structure. Aortic valve regurgitation is not  visualized. No aortic stenosis is present. Pulmonic  Valve: The pulmonic valve was normal in structure. Pulmonic valve regurgitation is not visualized. No evidence of pulmonic stenosis. Aorta: The aortic root is normal in size and structure. Venous: The inferior vena cava is normal in size with greater than 50% respiratory variability, suggesting right atrial pressure of 3 mmHg. IAS/Shunts: No atrial level shunt detected by color flow Doppler. Additional Comments: 3D was performed not requiring image post processing on an independent workstation and was indeterminate.  LEFT VENTRICLE PLAX 2D LVIDd:         4.80 cm   Diastology LVIDs:         2.60 cm   LV e' medial:    8.05 cm/s LV PW:         1.20 cm   LV E/e' medial:  10.9 LV IVS:        1.00 cm   LV e' lateral:   11.30 cm/s LVOT diam:     2.30 cm   LV E/e' lateral: 7.7 LVOT Area:     4.15 cm  RIGHT VENTRICLE             IVC RV Basal diam:  3.10 cm     IVC diam: 1.20 cm RV S prime:     18.80 cm/s TAPSE (M-mode): 2.8 cm LEFT ATRIUM           Index LA Vol (A2C): 41.7 ml 17.50 ml/m LA Vol (A4C): 54.7 ml 22.95 ml/m   AORTA Ao Root diam: 3.60 cm Ao Asc diam:  3.30 cm MITRAL VALVE MV Area (PHT): 3.77 cm    SHUNTS MV Decel Time: 201 msec    Systemic Diam: 2.30 cm MV E velocity: 87.40 cm/s MV A velocity: 60.80 cm/s MV E/A ratio:  1.44 Marsa Dooms MD Electronically signed by Marsa Dooms MD Signature Date/Time: 04/12/2024/1:44:01 PM    Final    PERIPHERAL VASCULAR CATHETERIZATION Result Date: 04/12/2024 See surgical note for result.    Medications:      buPROPion   300 mg Oral Daily   calcium  carbonate  1 tablet Oral TID WC   Chlorhexidine  Gluconate Cloth  6 each Topical Q0600   feeding supplement (NEPRO CARB STEADY)  237 mL Oral TID BM   heparin   5,000 Units Subcutaneous Q8H   metoprolol  succinate  50 mg Oral Daily   multivitamin  1 tablet Oral QHS   QUEtiapine   25 mg Oral QHS   venlafaxine  XR  150 mg Oral Q breakfast   venlafaxine  XR   75 mg Oral Q breakfast   heparin , metoprolol  tartrate, mouth rinse  Assessment/ Plan:  [name] is a 58 y.o.  male with a PMHx of hypertension, atrial fibrillation, BPH, hyperlipidemia, anxiety, constipation, morbid obesity, who was admitted to [facility] on 04/07/2024 for evaluation of fatigue, nausea, poor p.o. intake.   End stage renal disease on hemodialysis.There are several signs pointing to chronicity of renal insufficiency. He has a rather atrophic right kidney measuring 8.8 cm. In addition patient with anemia, severe acute metabolic acidosis, hypocalcemia. These all point to chronicity of disease. Uremic symptoms for several weeks. Extensive serologic workup including SPEP, UPEP, ANA, ANCA antibodies negative. Urine protein to creatinine ratio pending. Would not recommend renal biopsy at this time given high risk for bleeding given uremia.  Appreciate vascular surgery placing permcath. Functioned well during dialysis. UF 1L achieved. Outpatient dialysis clinic confirmed at [facility] on a TTS schedule.   Lab Results  Component Value Date   CREATININE 8.19 (  H) 04/13/2024   CREATININE 7.25 (H) 04/11/2024   CREATININE 17.37 (H) 04/09/2024    Intake/Output Summary (Last 24 hours) at 04/13/2024 1336 Last data filed at 04/13/2024 1314 Gross per 24 hour  Intake 240 ml  Output 1 ml  Net 239 ml   2.  Hyperkalemia.  Serum potassium 3.8 today   3.  Severe acute metabolic acidosis.  Serum bicarbonate 21, will correct with dialysis   4.  Anemia likely of chronic kidney disease.  Hemoglobin 10.1.    5.  Secondary hyperparathyroidism. Calcium  acceptable, phosphorus elevated. Received diet education today.      LOS: 6 [place]ina Stanfield 8/15/20251:36 PM

## 2024-04-13 NOTE — Progress Notes (Signed)
  Received patient in bed to unit.   Informed consent signed and in chart.    TX duration: completed     Hand-off given to patient's nurse.    Access used: CVC Dressing changed Access issues: none   Total UF removed: 1 ml Medication(s) given: none Post HD VS: stable Post HD weight:   127.6   Renika Shiflet OkleyLPN [facility]

## 2024-04-13 NOTE — TOC Transition Note (Signed)
 Transition of Care [facility]) - Discharge Note   Patient Details  Name: Brady Edwards MRN: [id number] Date of Birth: July 17, 1966  Transition of Care [facility] CM/SW Contact:  Lauraine JAYSON Carpen, LCSW Phone Number: 04/13/2024, 1:56 PM   Clinical Narrative:  Patient has orders to discharge home today. CSW met with patient. No family at bedside. Patient has been without power for 3 months due to inability to work. RN brought in resource list that was added to his AVS earlier this week. Patient stated they're probably the same agencies he has already tried. Explained that the hospital is unable to pay past-due bills. He does not have any family or friends that can assist him with the bill. Patient is new dialysis. CSW emailed the financial counselor asking her to talk with him about the disability process if he qualifies. No further concerns. CSW signing off.  Final next level of care: Home/Self Care Barriers to Discharge: No Barriers Identified   Patient Goals and CMS Choice            Discharge Placement                    Patient and family notified of of transfer: 04/13/24  Discharge Plan and Services Additional resources added to the After Visit Summary for                                       Social Drivers of Health (SDOH) Interventions SDOH Screenings   Food Insecurity: No Food Insecurity (01/31/2024)  Housing: High Risk (01/31/2024)  Transportation Needs: Unmet Transportation Needs (10/28/2023)  Utilities: At Risk (01/31/2024)  Depression (PHQ2-9): High Risk (02/27/2024)  Tobacco Use: High Risk (04/07/2024)     Readmission Risk Interventions     No data to display

## 2024-04-13 NOTE — Plan of Care (Signed)
  Problem: Education: Goal: Knowledge of General Education information will improve Description: Including pain rating scale, medication(s)/side effects and non-pharmacologic comfort measures Outcome: Progressing   Problem: Clinical Measurements: Goal: Respiratory complications will improve Outcome: Progressing   Problem: Clinical Measurements: Goal: Cardiovascular complication will be avoided Outcome: Progressing   Problem: Activity: Goal: Risk for activity intolerance will decrease Outcome: Progressing   Problem: Nutrition: Goal: Adequate nutrition will be maintained Outcome: Progressing   Problem: Elimination: Goal: Will not experience complications related to bowel motility Outcome: Progressing   Problem: Pain Managment: Goal: General experience of comfort will improve and/or be controlled Outcome: Progressing

## 2024-04-15 ENCOUNTER — Emergency Department

## 2024-04-15 ENCOUNTER — Inpatient Hospital Stay
Admission: EM | Admit: 2024-04-15 | Discharge: 2024-06-19 | DRG: 312 | Disposition: A | Discharge status: Skilled Nursing Facility

## 2024-04-15 ENCOUNTER — Other Ambulatory Visit: Payer: Self-pay

## 2024-04-15 ENCOUNTER — Observation Stay

## 2024-04-15 DIAGNOSIS — R531 Weakness: Secondary | ICD-10-CM

## 2024-04-15 DIAGNOSIS — E785 Hyperlipidemia, unspecified: Secondary | ICD-10-CM | POA: Diagnosis present

## 2024-04-15 DIAGNOSIS — T8249XA Other complication of vascular dialysis catheter, initial encounter: Secondary | ICD-10-CM

## 2024-04-15 DIAGNOSIS — Z833 Family history of diabetes mellitus: Secondary | ICD-10-CM

## 2024-04-15 DIAGNOSIS — Z043 Encounter for examination and observation following other accident: Secondary | ICD-10-CM | POA: Diagnosis not present

## 2024-04-15 DIAGNOSIS — N19 Unspecified kidney failure: Principal | ICD-10-CM

## 2024-04-15 DIAGNOSIS — Z87891 Personal history of nicotine dependence: Secondary | ICD-10-CM

## 2024-04-15 DIAGNOSIS — E8721 Acute metabolic acidosis: Secondary | ICD-10-CM | POA: Diagnosis present

## 2024-04-15 DIAGNOSIS — N186 End stage renal disease: Secondary | ICD-10-CM

## 2024-04-15 DIAGNOSIS — R109 Unspecified abdominal pain: Secondary | ICD-10-CM | POA: Diagnosis not present

## 2024-04-15 DIAGNOSIS — T8241XA Breakdown (mechanical) of vascular dialysis catheter, initial encounter: Secondary | ICD-10-CM | POA: Diagnosis not present

## 2024-04-15 DIAGNOSIS — Z6835 Body mass index (BMI) 35.0-35.9, adult: Secondary | ICD-10-CM

## 2024-04-15 DIAGNOSIS — K5900 Constipation, unspecified: Secondary | ICD-10-CM | POA: Diagnosis not present

## 2024-04-15 DIAGNOSIS — E860 Dehydration: Secondary | ICD-10-CM | POA: Diagnosis not present

## 2024-04-15 DIAGNOSIS — R45851 Suicidal ideations: Secondary | ICD-10-CM | POA: Diagnosis present

## 2024-04-15 DIAGNOSIS — E559 Vitamin D deficiency, unspecified: Secondary | ICD-10-CM | POA: Diagnosis present

## 2024-04-15 DIAGNOSIS — Z8249 Family history of ischemic heart disease and other diseases of the circulatory system: Secondary | ICD-10-CM

## 2024-04-15 DIAGNOSIS — Z992 Dependence on renal dialysis: Secondary | ICD-10-CM

## 2024-04-15 DIAGNOSIS — E669 Obesity, unspecified: Secondary | ICD-10-CM | POA: Insufficient documentation

## 2024-04-15 DIAGNOSIS — F32A Depression, unspecified: Secondary | ICD-10-CM | POA: Diagnosis present

## 2024-04-15 DIAGNOSIS — N179 Acute kidney failure, unspecified: Secondary | ICD-10-CM | POA: Diagnosis not present

## 2024-04-15 DIAGNOSIS — R296 Repeated falls: Secondary | ICD-10-CM | POA: Diagnosis present

## 2024-04-15 DIAGNOSIS — I480 Paroxysmal atrial fibrillation: Secondary | ICD-10-CM | POA: Diagnosis present

## 2024-04-15 DIAGNOSIS — H8110 Benign paroxysmal vertigo, unspecified ear: Secondary | ICD-10-CM | POA: Diagnosis present

## 2024-04-15 DIAGNOSIS — R110 Nausea: Secondary | ICD-10-CM | POA: Diagnosis not present

## 2024-04-15 DIAGNOSIS — R601 Generalized edema: Secondary | ICD-10-CM | POA: Diagnosis present

## 2024-04-15 DIAGNOSIS — Z1152 Encounter for screening for COVID-19: Secondary | ICD-10-CM

## 2024-04-15 DIAGNOSIS — E1122 Type 2 diabetes mellitus with diabetic chronic kidney disease: Secondary | ICD-10-CM | POA: Diagnosis present

## 2024-04-15 DIAGNOSIS — R000 Tachycardia, unspecified: Secondary | ICD-10-CM | POA: Diagnosis not present

## 2024-04-15 DIAGNOSIS — D638 Anemia in other chronic diseases classified elsewhere: Secondary | ICD-10-CM

## 2024-04-15 DIAGNOSIS — Y712 Prosthetic and other implants, materials and accessory cardiovascular devices associated with adverse incidents: Secondary | ICD-10-CM | POA: Diagnosis not present

## 2024-04-15 DIAGNOSIS — N400 Enlarged prostate without lower urinary tract symptoms: Secondary | ICD-10-CM | POA: Diagnosis present

## 2024-04-15 DIAGNOSIS — I120 Hypertensive chronic kidney disease with stage 5 chronic kidney disease or end stage renal disease: Secondary | ICD-10-CM | POA: Diagnosis present

## 2024-04-15 DIAGNOSIS — F4322 Adjustment disorder with anxiety: Secondary | ICD-10-CM | POA: Diagnosis present

## 2024-04-15 DIAGNOSIS — Z743 Need for continuous supervision: Secondary | ICD-10-CM | POA: Diagnosis not present

## 2024-04-15 DIAGNOSIS — I959 Hypotension, unspecified: Secondary | ICD-10-CM | POA: Diagnosis present

## 2024-04-15 DIAGNOSIS — R079 Chest pain, unspecified: Secondary | ICD-10-CM | POA: Diagnosis not present

## 2024-04-15 DIAGNOSIS — Z8679 Personal history of other diseases of the circulatory system: Secondary | ICD-10-CM

## 2024-04-15 DIAGNOSIS — Z5912 Inadequate housing utilities: Secondary | ICD-10-CM

## 2024-04-15 DIAGNOSIS — E876 Hypokalemia: Secondary | ICD-10-CM | POA: Diagnosis not present

## 2024-04-15 DIAGNOSIS — D631 Anemia in chronic kidney disease: Secondary | ICD-10-CM | POA: Diagnosis present

## 2024-04-15 DIAGNOSIS — G4733 Obstructive sleep apnea (adult) (pediatric): Secondary | ICD-10-CM | POA: Diagnosis present

## 2024-04-15 DIAGNOSIS — Z452 Encounter for adjustment and management of vascular access device: Secondary | ICD-10-CM | POA: Diagnosis not present

## 2024-04-15 DIAGNOSIS — Z5941 Food insecurity: Secondary | ICD-10-CM

## 2024-04-15 DIAGNOSIS — Z7952 Long term (current) use of systemic steroids: Secondary | ICD-10-CM

## 2024-04-15 DIAGNOSIS — Z79899 Other long term (current) drug therapy: Secondary | ICD-10-CM

## 2024-04-15 DIAGNOSIS — N2581 Secondary hyperparathyroidism of renal origin: Secondary | ICD-10-CM | POA: Diagnosis present

## 2024-04-15 DIAGNOSIS — I951 Orthostatic hypotension: Secondary | ICD-10-CM | POA: Diagnosis present

## 2024-04-15 LAB — CBC WITH DIFFERENTIAL/PLATELET
Abs Immature Granulocytes: 0.04 K/uL (ref 0.00–0.07)
Basophils Absolute: 0.1 K/uL (ref 0.0–0.1)
Basophils Relative: 1 %
Eosinophils Absolute: 0.2 K/uL (ref 0.0–0.5)
Eosinophils Relative: 2 %
HCT: 31.9 % — ABNORMAL LOW (ref 39.0–52.0)
Hemoglobin: 10.6 g/dL — ABNORMAL LOW (ref 13.0–17.0)
Immature Granulocytes: 0 %
Lymphocytes Relative: 19 %
Lymphs Abs: 1.8 K/uL (ref 0.7–4.0)
MCH: 30.9 pg (ref 26.0–34.0)
MCHC: 33.2 g/dL (ref 30.0–36.0)
MCV: 93 fL (ref 80.0–100.0)
Monocytes Absolute: 1 K/uL (ref 0.1–1.0)
Monocytes Relative: 10 %
Neutro Abs: 6.8 K/uL (ref 1.7–7.7)
Neutrophils Relative %: 68 %
Platelets: 223 K/uL (ref 150–400)
RBC: 3.43 MIL/uL — ABNORMAL LOW (ref 4.22–5.81)
RDW: 13 % (ref 11.5–15.5)
WBC: 9.9 K/uL (ref 4.0–10.5)
nRBC: 0 % (ref 0.0–0.2)

## 2024-04-15 LAB — BLOOD GAS, VENOUS
Acid-base deficit: 6.6 mmol/L — ABNORMAL HIGH (ref 0.0–2.0)
Bicarbonate: 16.7 mmol/L — ABNORMAL LOW (ref 20.0–28.0)
O2 Saturation: 99.1 %
Patient temperature: 37
pCO2, Ven: 27 mmHg — ABNORMAL LOW (ref 44–60)
pH, Ven: 7.4 (ref 7.25–7.43)
pO2, Ven: 90 mmHg — ABNORMAL HIGH (ref 32–45)

## 2024-04-15 LAB — COMPREHENSIVE METABOLIC PANEL WITH GFR
ALT: 26 U/L (ref 0–44)
AST: 27 U/L (ref 15–41)
Albumin: 3.1 g/dL — ABNORMAL LOW (ref 3.5–5.0)
Alkaline Phosphatase: 80 U/L (ref 38–126)
Anion gap: 17 — ABNORMAL HIGH (ref 5–15)
BUN: 54 mg/dL — ABNORMAL HIGH (ref 6–20)
CO2: 16 mmol/L — ABNORMAL LOW (ref 22–32)
Calcium: 8.7 mg/dL — ABNORMAL LOW (ref 8.9–10.3)
Chloride: 107 mmol/L (ref 98–111)
Creatinine, Ser: 10.53 mg/dL — ABNORMAL HIGH (ref 0.61–1.24)
GFR, Estimated: 5 mL/min — ABNORMAL LOW (ref >60)
Glucose, Bld: 158 mg/dL — ABNORMAL HIGH (ref 70–99)
Potassium: 4.1 mmol/L (ref 3.5–5.1)
Sodium: 140 mmol/L (ref 135–145)
Total Bilirubin: 0.9 mg/dL (ref 0.0–1.2)
Total Protein: 6.4 g/dL — ABNORMAL LOW (ref 6.5–8.1)

## 2024-04-15 LAB — BRAIN NATRIURETIC PEPTIDE: B Natriuretic Peptide: 145.3 pg/mL — ABNORMAL HIGH (ref 0.0–100.0)

## 2024-04-15 LAB — AMMONIA: Ammonia: 20 umol/L (ref 9–35)

## 2024-04-15 LAB — MAGNESIUM: Magnesium: 2.2 mg/dL (ref 1.7–2.4)

## 2024-04-15 LAB — PROTIME-INR
INR: 1.1 (ref 0.8–1.2)
Prothrombin Time: 14.7 s (ref 11.4–15.2)

## 2024-04-15 MED ORDER — MIDODRINE HCL 5 MG PO TABS
5.0000 mg | ORAL_TABLET | Freq: Three times a day (TID) | ORAL | Status: DC
  Administered 2024-04-15 – 2024-04-17 (×6): 5 mg via ORAL
  Filled 2024-04-15 (×6): qty 1

## 2024-04-15 MED ORDER — CALCIUM CARBONATE 1250 (500 CA) MG PO TABS
1.0000 | ORAL_TABLET | Freq: Three times a day (TID) | ORAL | Status: DC
  Administered 2024-04-16 – 2024-04-20 (×11): 1250 mg via ORAL
  Filled 2024-04-15 (×17): qty 1

## 2024-04-15 MED ORDER — VENLAFAXINE HCL ER 75 MG PO CP24: 75.0000 mg | ORAL_CAPSULE | Freq: Every day | ORAL | Status: DC

## 2024-04-15 MED ORDER — VENLAFAXINE HCL ER 150 MG PO CP24: 150.0000 mg | ORAL_CAPSULE | Freq: Every day | ORAL | Status: DC

## 2024-04-15 MED ORDER — VENLAFAXINE HCL ER 75 MG PO CP24
75.0000 mg | ORAL_CAPSULE | Freq: Every day | ORAL | Status: DC
  Administered 2024-04-16 – 2024-06-19 (×57): 75 mg via ORAL
  Filled 2024-04-15 (×65): qty 1

## 2024-04-15 MED ORDER — HEPARIN SODIUM (PORCINE) 5000 UNIT/ML IJ SOLN
5000.0000 [IU] | Freq: Three times a day (TID) | INTRAMUSCULAR | Status: DC
  Administered 2024-04-15 – 2024-06-19 (×170): 5000 [IU] via SUBCUTANEOUS
  Filled 2024-04-15 (×171): qty 1

## 2024-04-15 MED ORDER — BUPROPION HCL ER (XL) 150 MG PO TB24
300.0000 mg | ORAL_TABLET | Freq: Every day | ORAL | Status: DC
  Administered 2024-04-16 – 2024-06-19 (×59): 300 mg via ORAL
  Filled 2024-04-15 (×59): qty 2

## 2024-04-15 MED ORDER — SODIUM CHLORIDE 0.9 % IV BOLUS
250.0000 mL | Freq: Once | INTRAVENOUS | Status: AC
  Administered 2024-04-15: 250 mL via INTRAVENOUS

## 2024-04-15 MED ORDER — VENLAFAXINE HCL ER 150 MG PO CP24
150.0000 mg | ORAL_CAPSULE | Freq: Every day | ORAL | Status: DC
Start: 2024-04-16 — End: 2024-06-19
  Administered 2024-04-16 – 2024-06-19 (×57): 150 mg via ORAL
  Filled 2024-04-15 (×65): qty 1

## 2024-04-15 NOTE — ED Notes (Signed)
 Pt reported SI. Charge nurse notified. This RN will stay at bedside until sitter is available. Pt placed in psych scrubs and belongings logged.

## 2024-04-15 NOTE — ED Notes (Signed)
Sitter at bedside at this time 

## 2024-04-15 NOTE — ED Provider Notes (Signed)
 [facility] Provider Note    Event Date/Time   First MD Initiated Contact with Patient 04/15/24 1514     (approximate)   History   Weakness   HPI  Brady Edwards is a 58 y.o. male with pmh of hypertension, atrial fibrillation on metoprolol  not on anticoagulation, BPH, hyperlipidemia, anxiety, constipation, morbid obesity recently admitted from 8/9-8/15/25 recently initiated on dialysis who presents from home with general weakness, frequent falls since discharge, shortness of breath and passive suicidal ideation.  Patient lives alone in a trailer that does not currently have electricity.  He states that since discharge he has fallen 3 times the last just prior to his EMS call.  There was a positive head strike but he denies any headache or pain anywhere else.  He has noticed that his scrotum has become increasingly edematous.  He endorses worsening shortness of breath.  His last dialysis session occurred prior to discharge and he is not scheduled to go for dialysis until Tuesday.  He tells me that he would be better off not living at this point but does not have a plan for suicide and is going to receive help.  Per EMS he was hypotensive en route and received 500 cc of fluids for a blood pressure of 94/60.  He was tachycardic to 115 as well      Physical Exam   Triage Vital Signs: ED Triage Vitals  Encounter Vitals Group     BP --      Girls Systolic BP Percentile --      Girls Diastolic BP Percentile --      Boys Systolic BP Percentile --      Boys Diastolic BP Percentile --      Pulse --      Resp --      Temp --      Temp src --      SpO2 --      Weight 04/15/24 1514 294 lb (133.4 kg)     Height 04/15/24 1514 5' 8 (1.727 m)     Head Circumference --      Peak Flow --      Pain Score 04/15/24 1513 0     Pain Loc --      Pain Education --      Exclude from Growth Chart --     Most recent vital signs: Vitals:   04/15/24 1525 04/15/24 1530   BP: (!) 86/65 101/70  Pulse: (!) 105 (!) 114  Resp: 12 19  Temp: 99.4 F (37.4 C)   SpO2: 99% 99%    Nursing Triage Note reviewed. Vital signs reviewed and patients oxygen saturation is normoxic  General: Patient is well nourished, well developed, awake and alert, resting comfortably in no acute distress Head: Normocephalic no scalp lacerations Eyes: Normal inspection, extraocular muscles intact, no conjunctival pallor Ear, nose, throat: Normal external exam Neck: Normal range of motion Respiratory: Patient is in no respiratory distress, lungs with mild rales Cardiovascular: Patient is tachycardic, RR without murmur appreciated There is a dialysis catheter in his right chest clean dry and intact GI: Abd SNT with no guarding or rebound GU: Edematous scrotum without crepitus or erythema Extremities: pulses intact with good cap refills, able to straight leg lift bilaterally Neuro: The patient is alert and oriented to person, place, and time, appropriately conversive, with 5/5 bilat UE/LE strength, no gross motor or sensory defects noted. Coordination appears to be adequate. Skin: Warm, dry, and  intact Psych: Depressed mood, passive SI, no HI no AVHS  ED Results / Procedures / Treatments   Labs (all labs ordered are listed, but only abnormal results are displayed) Labs Reviewed  BLOOD GAS, VENOUS - Abnormal; Notable for the following components:      Result Value   pCO2, Ven 27 (*)    pO2, Ven 90 (*)    Bicarbonate 16.7 (*)    Acid-base deficit 6.6 (*)    All other components within normal limits  CBC WITH DIFFERENTIAL/PLATELET - Abnormal; Notable for the following components:   RBC 3.43 (*)    Hemoglobin 10.6 (*)    HCT 31.9 (*)    All other components within normal limits  COMPREHENSIVE METABOLIC PANEL WITH GFR - Abnormal; Notable for the following components:   CO2 16 (*)    Glucose, Bld 158 (*)    BUN 54 (*)    Creatinine, Ser 10.53 (*)    Calcium  8.7 (*)    Total  Protein 6.4 (*)    Albumin  3.1 (*)    GFR, Estimated 5 (*)    Anion gap 17 (*)    All other components within normal limits  BRAIN NATRIURETIC PEPTIDE - Abnormal; Notable for the following components:   B Natriuretic Peptide 145.3 (*)    All other components within normal limits  MAGNESIUM  PROTIME-INR  AMMONIA     EKG EKG and rhythm strip are interpreted by myself:   EKG: tachycardic sinus rhythm] at heart rate of 119, normal QRS duration, QTc 465, nonspecific ST segments and T waves no ectopy EKG not consistent with Acute STEMI Rhythm strip: Tachycardic sinus rhythm in lead II   RADIOLOGY CT head without contrast Chest x-ray    PROCEDURES:  Critical Care performed: No  Procedures   MEDICATIONS ORDERED IN ED: Medications  sodium chloride  0.9 % bolus 250 mL (0 mLs Intravenous Stopped 04/15/24 1719)     IMPRESSION / MDM / ASSESSMENT AND PLAN / ED COURSE                                Differential diagnosis includes, but is not limited to, end-stage renal disease and the need of dialysis, anasarca, hypervolemia electrolyte derangement, intracranial hemorrhage, pleural effusions, organic psychiatric disorder  ED course: Patient arrives and he is tachycardic and mildly hypotensive.  I do believe that he is symptomatic from such.  I will order him just a small other bolus of 250 of IV fluid as I do think he is hypervolemic already but is satting well on room air--if he requires further support may start him on midodrine .  Patient does report passive SI but does want help and does not want to return home.  He even states that a skilled nursing facility may be a reasonable choice.  Will keep the patient voluntary for now but remove dangerous items from the room and place an inpatient consult to psychiatry.  CT head and x-ray is pending but anticipate patient will require admission today   Clinical Course as of 04/15/24 1759  Sun Apr 15, 2024  1603 Creatinine(!):  10.53 Consistent with needing dialysis [HD]  1603 HCT(!): 31.9 At patient's baseline [HD]  1705 DG Chest Port 1 View No significant pleural edema [HD]  1708 Sent an epic message to nephrology team, Dr. Matilda regarding patient status. He will dialyze patient first thing in the AM [HD]  1725 CT Head  Wo Contrast CT head is unremarkable.  Will call the patient in for admission [HD]    Clinical Course User Index [HD] Nicholaus Rolland BRAVO, MD     FINAL CLINICAL IMPRESSION(S) / ED DIAGNOSES   Final diagnoses:  Renal failure, unspecified chronicity  General weakness  Frequent falls  Anasarca  Suicidal ideation     Rx / DC Orders   ED Discharge Orders     None        Note:  This document was prepared using Dragon voice recognition software and may include unintentional dictation errors.   Nicholaus Rolland BRAVO, MD 04/15/24 [phone]

## 2024-04-15 NOTE — ED Notes (Signed)
 Pt's contacts: Lamar Cheese (brother) [phone] and Dufm (friend) [phone]

## 2024-04-15 NOTE — H&P (Signed)
 History and Physical    Brady Edwards FMW:[id number] DOB: 08-22-1966 DOA: 04/15/2024  PCP: Vicci Duwaine SQUIBB, DO  Patient coming from: home  I have personally briefly reviewed patient's old medical records in [facility]  Chief Complaint: weakness and falls  HPI: Brady Edwards is a 58 y.o. male with medical history significant of newly ESRD recently started on HD, atrial fibrillation, anxiety and depression, morbid obesity who presented with complaints of weakness and falls.  Pt was very frustrated with his medical and mental problems, and wanted us  to make him better.  Pt complained of fatigue, lack of energy, and weakness, causing to fall several times since he was discharged from hospital on 04/13/24.  No focal neuro deficits.  Pt said he has had long standing mental health issues and needs to see psych.  Pt complained of constipation for the past 2 months that wasn't addressed during his last hospitalization.    Of note, pt was hospitalized from 04/07/24 to 04/13/24 during which he was started on dialysis, and was discharged after dialysis on 8/15 to continue outpatient dialysis TTS.  ED Course: initial vitals: afebrile, pulse 105, BP 86/65, RR 12, sating 99% on room air.  Labs consistent with ESRD.  CXR no acute finding.  EKG showed sinus tachycardia.  CT head no acute finding.  ED provider reported pt said he would be better off not living at this point but does not have a plan for suicide and is going to receive help.  Psych consult placed.  ED provider contacted oncall nephro, who plans dialysis for pt tomorrow.   Assessment/Plan  # Weakness and fall --Fatigue and weakness have been progressive, due to recent hospitalization and decline in health.  CT head wnl, no acute finding. --PT/OT  # ESRD on HD TTS --recently started on dialysis --plan for dialysis tomorrow, per nephro  # Hypotension --250 ml bolus given in the ED --Hold home Toprol  due to low BP --start  midodrine   # Sinus tachycardia --EKG on presentation showed sinus tach --monitor for now, no need for intervention since sinus rhythm.  # Hx of Afib --pt not taking anticoagulation PTA --Hold home Toprol  due to low BP  # Depression and hx of panic disorder # Passive SI --pt desires psych eval --cont home bupropion  and Effexor  --psych consult  # OSA --CPAP nightly  # Obesity, Class III, BMI 40-49.9 (morbid obesity)    DVT prophylaxis: Heparin  SQ Code Status: Full code  Family Communication:   Disposition Plan: likely home  Consults called: psych and nephro Level of care: Med-Surg   Review of Systems: As per HPI otherwise complete review of systems negative.   Past Medical History:  Diagnosis Date   Atrial fibrillation (HCC)    Depression    Hypertension     Past Surgical History:  Procedure Laterality Date   DIALYSIS/PERMA CATHETER INSERTION N/A 04/12/2024   Procedure: DIALYSIS/PERMA CATHETER INSERTION;  Surgeon: Marea Selinda RAMAN, MD;  Location: [facility] INVASIVE CV LAB;  Service: Cardiovascular;  Laterality: N/A;   TEMPORARY DIALYSIS CATHETER N/A 04/09/2024   Procedure: TEMPORARY DIALYSIS CATHETER;  Surgeon: Marea Selinda RAMAN, MD;  Location: [facility] INVASIVE CV LAB;  Service: Cardiovascular;  Laterality: N/A;     reports that he has been smoking cigarettes. He has never used smokeless tobacco. He reports that he does not currently use alcohol. He reports that he does not use drugs.  No Known Allergies  Family History  Problem Relation Age of Onset  Cancer Mother        uterine, ovarian, and breast   Heart disease Father        MI   Diabetes Brother    Cancer Maternal Grandmother        skin   Cancer Maternal Grandfather        skin   Cancer Paternal Grandmother     Prior to Admission medications   Medication Sig Start Date End Date Taking? Authorizing Provider  buPROPion  (WELLBUTRIN  XL) 300 MG 24 hr tablet Take 1 tablet (300 mg total) by mouth daily. 02/27/24  Yes  Johnson, Megan P, DO  calcium  carbonate (OSCAL) 1500 (600 Ca) MG TABS tablet Take 1 tablet (1,500 mg total) by mouth 3 (three) times daily with meals. 04/13/24  Yes Caleen Qualia, MD  lisinopril  (ZESTRIL ) 10 MG tablet Take 10 mg by mouth daily.   Yes [provider]  metoprolol  succinate (TOPROL -XL) 50 MG 24 hr tablet Take 1 tablet (50 mg total) by mouth daily. Take with or immediately following a meal. 04/13/24  Yes Amin, Sumayya, MD  multivitamin (RENA-VIT) TABS tablet Take 1 tablet by mouth at bedtime. 04/13/24  Yes Caleen Qualia, MD  venlafaxine  XR (EFFEXOR -XR) 150 MG 24 hr capsule Take 1 capsule (150 mg total) by mouth daily with breakfast. To be taken with 75mg  for 225mg  daily total 02/27/24  Yes Johnson, Megan P, DO  venlafaxine  XR (EFFEXOR -XR) 75 MG 24 hr capsule To be taken with 150mg  for 225mg  daily total. 02/27/24  Yes Johnson, Megan P, DO  Nutritional Supplements (FEEDING SUPPLEMENT, NEPRO CARB STEADY,) LIQD Take 237 mLs by mouth 3 (three) times daily between meals. 04/13/24   Caleen Qualia, MD    Physical Exam: Vitals:   04/15/24 1514 04/15/24 1525 04/15/24 1530  BP:  (!) 86/65 101/70  Pulse:  (!) 105 (!) 114  Resp:  12 19  Temp:  99.4 F (37.4 C)   TempSrc:  Oral   SpO2:  99% 99%  Weight: 133.4 kg    Height: 5' 8 (1.727 m)      Constitutional: NAD, AAOx3 HEENT: conjunctivae and lids normal, EOMI CV: No cyanosis.   RESP: normal respiratory effort, on RA Neuro: II - XII grossly intact.   Psych: irate mood and affect.     Labs on Admission: I have personally reviewed labs and imaging studies  Time spent: 75 minutes  Ellouise Haber MD [facility]  If 7PM-7AM, please contact night-coverage 04/15/2024, 6:19 PM

## 2024-04-15 NOTE — ED Triage Notes (Signed)
 BIBEMS from home. Diagnosed with kidney failure on Friday 8/15. Port in place, has not started dialysis yet. Pt reports feeling weak and falling 2 times in past few days. 94/60, 115 HR, CBG 160.

## 2024-04-16 DIAGNOSIS — D631 Anemia in chronic kidney disease: Secondary | ICD-10-CM | POA: Diagnosis not present

## 2024-04-16 DIAGNOSIS — F4321 Adjustment disorder with depressed mood: Secondary | ICD-10-CM | POA: Diagnosis not present

## 2024-04-16 DIAGNOSIS — E8721 Acute metabolic acidosis: Secondary | ICD-10-CM | POA: Diagnosis not present

## 2024-04-16 DIAGNOSIS — N2581 Secondary hyperparathyroidism of renal origin: Secondary | ICD-10-CM | POA: Diagnosis not present

## 2024-04-16 DIAGNOSIS — R531 Weakness: Secondary | ICD-10-CM | POA: Diagnosis not present

## 2024-04-16 DIAGNOSIS — N186 End stage renal disease: Secondary | ICD-10-CM | POA: Diagnosis not present

## 2024-04-16 MED ORDER — ONDANSETRON HCL 4 MG/2ML IJ SOLN
4.0000 mg | Freq: Four times a day (QID) | INTRAMUSCULAR | Status: DC | PRN
  Administered 2024-04-26: 4 mg via INTRAVENOUS
  Filled 2024-04-16 (×3): qty 2

## 2024-04-16 MED ORDER — ALTEPLASE 2 MG IJ SOLR: 2.0000 mg | Freq: Once | INTRAMUSCULAR | Status: DC | PRN

## 2024-04-16 MED ORDER — CHLORHEXIDINE GLUCONATE CLOTH 2 % EX PADS
6.0000 | MEDICATED_PAD | Freq: Every day | CUTANEOUS | Status: DC
  Administered 2024-04-17 – 2024-06-19 (×64): 6 via TOPICAL

## 2024-04-16 MED ORDER — ONDANSETRON 4 MG PO TBDP
4.0000 mg | ORAL_TABLET | Freq: Three times a day (TID) | ORAL | Status: DC | PRN
  Administered 2024-04-16 – 2024-06-15 (×4): 4 mg via ORAL
  Filled 2024-04-16 (×4): qty 1

## 2024-04-16 MED ORDER — ACETAMINOPHEN 500 MG PO TABS
1000.0000 mg | ORAL_TABLET | Freq: Three times a day (TID) | ORAL | Status: DC | PRN
  Filled 2024-04-16: qty 2

## 2024-04-16 MED ORDER — HEPARIN SODIUM (PORCINE) 1000 UNIT/ML DIALYSIS
1000.0000 [IU] | INTRAMUSCULAR | Status: DC | PRN
  Administered 2024-04-17: 4200 [IU]
  Filled 2024-04-16 (×2): qty 1
  Filled 2024-04-16: qty 2

## 2024-04-16 NOTE — Progress Notes (Signed)
  PROGRESS NOTE    Brady Edwards  FMW:[id number] DOB: Dec 07, 1965 DOA: 04/15/2024 PCP: Vicci Bouchard P, DO  [id number]/[id number]  LOS: 0 days   Brief hospital course:   Assessment & Plan: Brady Edwards is a 58 y.o. male with medical history significant of newly ESRD recently started on HD, atrial fibrillation, anxiety and depression, morbid obesity who presented with complaints of weakness and falls.    # Weakness and fall --Fatigue and weakness have been progressive, due to recent hospitalization and decline in health.  CT head wnl, no acute finding. --PT/OT   # ESRD on HD TTS --recently started on dialysis --iHD per nephro   # Hypotension --250 ml bolus given in the ED --Hold home Toprol  due to low BP --cont midodrine  (new)   # Sinus tachycardia --EKG on presentation showed sinus tach --no need for intervention since sinus rhythm.   # Hx of Afib --pt not taking anticoagulation PTA --hold home Toprol  due to low BP   # Depression and hx of panic disorder # Passive SI --pt desires psych eval --cont home bupropion  and Effexor  --psych consult today --no need for SI, per psych   # OSA --CPAP nightly   # Obesity, Class III, BMI 40-49.9 (morbid obesity)    DVT prophylaxis: Heparin  SQ Code Status: Full code  Family Communication:  Level of care: Med-Surg Dispo:   The patient is from: home Anticipated d/c is to: SNF rehab Anticipated d/c date is: whenever SNF accepts    Subjective and Interval History:  Pt appeared to be in better mood today.  No new complaint.   Objective: Vitals:   04/16/24 0344 04/16/24 0758 04/16/24 1436 04/16/24 2013  BP: 100/60 103/73 97/60 (!) 117/57  Pulse: 75 89 90   Resp: 16 18 18 18   Temp: 98.9 F (37.2 C) 98.8 F (37.1 C) 98.1 F (36.7 C)   TempSrc:      SpO2: 95% 99% 96% 97%  Weight:      Height:       No intake or output data in the 24 hours ending 04/16/24 2055 Filed Weights   04/15/24 1514  Weight: 133.4 kg     Examination:   Constitutional: NAD, AAOx3 HEENT: conjunctivae and lids normal, EOMI CV: No cyanosis.   RESP: normal respiratory effort, on RA Neuro: II - XII grossly intact.   Psych: better mood and affect.     Data Reviewed: I have personally reviewed labs and imaging studies  Time spent: 35 minutes  Ellouise Haber, MD [facility] If 7PM-7AM, please contact night-coverage 04/16/2024, 8:55 PM

## 2024-04-16 NOTE — Plan of Care (Signed)
   Problem: Education: Goal: Knowledge of General Education information will improve Description Including pain rating scale, medication(s)/side effects and non-pharmacologic comfort measures Outcome: Progressing

## 2024-04-16 NOTE — Evaluation (Signed)
 Physical Therapy Evaluation Patient Details Name: Brady Edwards MRN: [id number] DOB: 1965-12-09 Today's Date: 04/16/2024  History of Present Illness  Brady Edwards is a 58 y.o. male with pmh of hypertension, atrial fibrillation on metoprolol  not on anticoagulation, BPH, hyperlipidemia, anxiety, constipation, morbid obesity recently admitted from 8/9-8/15/25 recently initiated on dialysis who presents from home with general weakness, frequent falls since discharge, shortness of breath and passive suicidal ideation.   Clinical Impression  Pt admitted with above diagnosis. Pt currently with functional limitations due to the deficits listed below (see PT Problem List). Pt received upright in bed agreeable to PT. Appears very frustrated with current situation. Reports multiple falls since being re-admitted. Described body becoming tremulous and being weak leading to slow control to ground. Denies dizziness/lightheadedness.   To date, pt is independent with bed mobility and STS transfers. Pt is generally unsteady needing CGA to minA for gait without AD ~120'. Pt has x2 R lateral LOB needing stepping strategy to correct along with minA and RUE on hand rail to correct. Pt becomes SOB along with nausea sitting upright in recliner. Educated on energy conservation, graded mobility, pacing to reduce falls risk. Pt understanding. Pt will benefit from skilled PT services to address acute impairments and maximize return to PLOF.      If plan is discharge home, recommend the following: A little help with walking and/or transfers;Assistance with cooking/housework;Assist for transportation;Supervision due to cognitive status;Help with stairs or ramp for entrance   Can travel by private vehicle   Yes    Equipment Recommendations Other (comment) (TBD by next venue of care)  Recommendations for Other Services       Functional Status Assessment Patient has had a recent decline in their functional status and  demonstrates the ability to make significant improvements in function in a reasonable and predictable amount of time.     Precautions / Restrictions Precautions Precautions: Fall Recall of Precautions/Restrictions: Intact Restrictions Weight Bearing Restrictions Per Provider Order: No      Mobility  Bed Mobility Overal bed mobility: Independent               Patient Response: Cooperative, Anxious  Transfers Overall transfer level: Independent Equipment used: None                    Ambulation/Gait Ambulation/Gait assistance: Contact guard assist, Min assist Gait Distance (Feet): 120 Feet Assistive device: None Gait Pattern/deviations: Step-through pattern, Staggering right, Leaning posteriorly       General Gait Details: CGA for gait mostly. Diminshed gait speed compared to previous eval. Does have x2 instances of R stepping strategy to correct balance needing minA to correct with RUE on hallway railing for steadying.  Stairs            Wheelchair Mobility     Tilt Bed Tilt Bed Patient Response: Cooperative, Anxious  Modified Rankin (Stroke Patients Only)       Balance Overall balance assessment: Mild deficits observed, not formally tested                                           Pertinent Vitals/Pain Pain Assessment Pain Assessment: No/denies pain    Home Living Family/patient expects to be discharged to:: Private residence Living Arrangements: Alone   Type of Home: Mobile home Home Access: Stairs to enter Entrance Stairs-Rails: Right Entrance Stairs-Number of Steps:  2-3   Home Layout: One level Home Equipment: None      Prior Function Prior Level of Function : Independent/Modified Independent             Mobility Comments: Had multiple falls. Able to get up on his own.       Extremity/Trunk Assessment   Upper Extremity Assessment Upper Extremity Assessment: Overall WFL for tasks assessed     Lower Extremity Assessment Lower Extremity Assessment: Generalized weakness       Communication   Communication Communication: No apparent difficulties    Cognition Arousal: Alert Behavior During Therapy: WFL for tasks assessed/performed   PT - Cognitive impairments: No apparent impairments                       PT - Cognition Comments: Appears very frustrated. Is short with author on multiple occasions getting mildly agitated. Following commands: Intact       Cueing Cueing Techniques: Verbal cues     General Comments      Exercises Other Exercises Other Exercises: Role of PT in acute setting, pacing, energy conservatin, graded mobility to pt tolerance.   Assessment/Plan    PT Assessment Patient needs continued PT services  PT Problem List Decreased strength;Decreased activity tolerance;Decreased balance       PT Treatment Interventions DME instruction;Therapeutic exercise;Gait training;Balance training;Stair training;Neuromuscular re-education;Functional mobility training;Therapeutic activities;Patient/family education    PT Goals (Current goals can be found in the Care Plan section)  Acute Rehab PT Goals Patient Stated Goal: improve independence, find out his medical problems PT Goal Formulation: With patient Time For Goal Achievement: 04/30/24 Potential to Achieve Goals: Fair    Frequency Min 1X/week     Co-evaluation               AM-PAC PT 6 Clicks Mobility  Outcome Measure Help needed turning from your back to your side while in a flat bed without using bedrails?: None   Help needed moving to and from a bed to a chair (including a wheelchair)?: None Help needed standing up from a chair using your arms (e.g., wheelchair or bedside chair)?: None Help needed to walk in hospital room?: A Little Help needed climbing 3-5 steps with a railing? : A Lot 6 Click Score: 17    End of Session Equipment Utilized During Treatment: Gait  belt Activity Tolerance: Patient tolerated treatment well;Patient limited by fatigue Patient left: in chair;with nursing/sitter in room Nurse Communication: Mobility status PT Visit Diagnosis: Muscle weakness (generalized) (M62.81);History of falling (Z91.81)    Time: 9179-9166 PT Time Calculation (min) (ACUTE ONLY): 13 min   Charges:   PT Evaluation $PT Eval Low Complexity: 1 Low   PT General Charges $$ ACUTE PT VISIT: 1 Visit         Dorina HERO. Fairly IV, PT, DPT Physical Therapist- [place]  [facility] 04/16/2024, 8:48 AM

## 2024-04-16 NOTE — Plan of Care (Signed)
  Problem: Education: Goal: Knowledge of General Education information will improve Description: Including pain rating scale, medication(s)/side effects and non-pharmacologic comfort measures Outcome: Progressing   Problem: Health Behavior/Discharge Planning: Goal: Ability to manage health-related needs will improve Outcome: Progressing   Problem: Activity: Goal: Risk for activity intolerance will decrease Outcome: Progressing   Problem: Coping: Goal: Level of anxiety will decrease Outcome: Progressing   Problem: Elimination: Goal: Will not experience complications related to bowel motility Outcome: Progressing   Problem: Safety: Goal: Ability to remain free from injury will improve Outcome: Progressing   

## 2024-04-16 NOTE — Progress Notes (Signed)
 [facility]  ROUNDING NOTE   Subjective:   Brady Edwards is a 58 y.o. male with a PMHx of hypertension, atrial fibrillation, BPH, hyperlipidemia, anxiety, constipation, morbid obesity and recently diagnosed end stage renal disease on hemodialysis. He was recently discharged but has returned due to weakness and falls. He has been admitted for Suicidal ideation [R45.851] Anasarca [R60.1] Weakness [R53.1] General weakness [R53.1] Frequent falls [R29.6] Renal failure, unspecified chronicity [N19]  Patient is known to our practice and receives outpatient dialysis treatments at [facility] on a TTS schedule. Last treatment received on Friday, prior to hospital discharge. Patient states he returned to his home and remained weak. Was unable to get around his home without falling. Patient also request a psych evaluation.  Labs are stable for renal patient. Imaging negative for acute findings.   We have been consulted to maintain dialysis needs.    Objective:  Vital signs in last 24 hours:  Temp:  [98.2 F (36.8 C)-99.4 F (37.4 C)] 98.8 F (37.1 C) (08/18 0758) Pulse Rate:  [75-120] 89 (08/18 0758) Resp:  [12-19] 18 (08/18 0758) BP: (86-119)/(39-80) 103/73 (08/18 0758) SpO2:  [95 %-99 %] 99 % (08/18 0758) Weight:  [133.4 kg] 133.4 kg (08/17 1514)  Weight change:  Filed Weights   04/15/24 1514  Weight: 133.4 kg    Intake/Output: No intake/output data recorded.   Intake/Output this shift:  No intake/output data recorded.  Physical Exam: General: NAD  Head: Normocephalic, atraumatic. Moist oral mucosal membranes  Eyes: Anicteric  Lungs:  Clear to auscultation, normal effort  Heart: Regular rate and rhythm  Abdomen:  Soft, nontender  Extremities:  No peripheral edema.  Neurologic: Awake, alert, conversant  Skin: Warm,dry, no rash  Access: Rt Permcath     Basic Metabolic Panel: Recent Labs  Lab 04/11/24 0427 04/13/24 0800 04/15/24 1529  NA 138  142 140  K 3.3* 3.8 4.1  CL 103 109 107  CO2 24 21* 16*  GLUCOSE 91 87 158*  BUN 57* 54* 54*  CREATININE 7.25* 8.19* 10.53*  CALCIUM  6.8* 8.2* 8.7*  MG  --   --  2.2  PHOS 5.8* 6.1*  --     Liver Function Tests: Recent Labs  Lab 04/11/24 0427 04/13/24 0800 04/15/24 1529  AST  --   --  27  ALT  --   --  26  ALKPHOS  --   --  80  BILITOT  --   --  0.9  PROT  --   --  6.4*  ALBUMIN  2.5* 3.0* 3.1*   No results for input(s): LIPASE, AMYLASE in the last 168 hours.  Recent Labs  Lab 04/15/24 1606  AMMONIA 20    CBC: Recent Labs  Lab 04/09/24 1620 04/11/24 0427 04/13/24 0800 04/15/24 1529  WBC 6.6 6.0 8.5 9.9  NEUTROABS  --   --   --  6.8  HGB 9.0* 9.4* 10.1* 10.6*  HCT 26.3* 28.1* 31.7* 31.9*  MCV 91.0 91.5 95.8 93.0  PLT 183 173 192 223    Cardiac Enzymes: No results for input(s): CKTOTAL, CKMB, CKMBINDEX, TROPONINI in the last 168 hours.   BNP: Invalid input(s): POCBNP  CBG: No results for input(s): GLUCAP in the last 168 hours.  Microbiology: Results for orders placed or performed during the hospital encounter of 04/07/24  Resp panel by RT-PCR (RSV, Flu A&B, Covid) Anterior Nasal Swab     Status: None   Collection Time: 04/07/24  4:46 PM  Specimen: Anterior Nasal Swab  Result Value Ref Range Status   SARS Coronavirus 2 by RT PCR NEGATIVE NEGATIVE Final    Comment: (NOTE) SARS-CoV-2 target nucleic acids are NOT DETECTED.  The SARS-CoV-2 RNA is generally detectable in upper respiratory specimens during the acute phase of infection. The lowest concentration of SARS-CoV-2 viral copies this assay can detect is 138 copies/mL. A negative result does not preclude SARS-Cov-2 infection and should not be used as the sole basis for treatment or other patient management decisions. A negative result may occur with  improper specimen collection/handling, submission of specimen other than nasopharyngeal swab, presence of viral mutation(s) within  the areas targeted by this assay, and inadequate number of viral copies(<138 copies/mL). A negative result must be combined with clinical observations, patient history, and epidemiological information. The expected result is Negative.  Fact Sheet for Patients:  BloggerCourse.com  Fact Sheet for Healthcare Providers:  SeriousBroker.it  This test is no t yet approved or cleared by the [place]  FDA and  has been authorized for detection and/or diagnosis of SARS-CoV-2 by FDA under an Emergency Use Authorization (EUA). This EUA will remain  in effect (meaning this test can be used) for the duration of the COVID-19 declaration under Section 564(b)(1) of the Act, 21 U.S.C.section 360bbb-3(b)(1), unless the authorization is terminated  or revoked sooner.       Influenza A by PCR NEGATIVE NEGATIVE Final   Influenza B by PCR NEGATIVE NEGATIVE Final    Comment: (NOTE) The Xpert Xpress SARS-CoV-2/FLU/RSV plus assay is intended as an aid in the diagnosis of influenza from Nasopharyngeal swab specimens and should not be used as a sole basis for treatment. Nasal washings and aspirates are unacceptable for Xpert Xpress SARS-CoV-2/FLU/RSV testing.  Fact Sheet for Patients: BloggerCourse.com  Fact Sheet for Healthcare Providers: SeriousBroker.it  This test is not yet approved or cleared by the [place]  FDA and has been authorized for detection and/or diagnosis of SARS-CoV-2 by FDA under an Emergency Use Authorization (EUA). This EUA will remain in effect (meaning this test can be used) for the duration of the COVID-19 declaration under Section 564(b)(1) of the Act, 21 U.S.C. section 360bbb-3(b)(1), unless the authorization is terminated or revoked.     Resp Syncytial Virus by PCR NEGATIVE NEGATIVE Final    Comment: (NOTE) Fact Sheet for  Patients: BloggerCourse.com  Fact Sheet for Healthcare Providers: SeriousBroker.it  This test is not yet approved or cleared by the [place]  FDA and has been authorized for detection and/or diagnosis of SARS-CoV-2 by FDA under an Emergency Use Authorization (EUA). This EUA will remain in effect (meaning this test can be used) for the duration of the COVID-19 declaration under Section 564(b)(1) of the Act, 21 U.S.C. section 360bbb-3(b)(1), unless the authorization is terminated or revoked.  Performed at [facility], [address] [place], [place] [address]     Coagulation Studies: Recent Labs    04/15/24 1529  LABPROT 14.7  INR 1.1    Urinalysis: No results for input(s): COLORURINE, LABSPEC, PHURINE, GLUCOSEU, HGBUR, BILIRUBINUR, KETONESUR, PROTEINUR, UROBILINOGEN, NITRITE, LEUKOCYTESUR in the last 72 hours.  Invalid input(s): APPERANCEUR    Imaging: DG Abd 1 View Result Date: 04/15/2024 CLINICAL DATA:  Constipation.  Abdominal pain. EXAM: ABDOMEN - 1 VIEW COMPARISON:  CT 04/08/2024 FINDINGS: No bowel dilatation or evidence of obstruction. Air within the colon without significant formed stool. The previous contrast in the colon has cleared. There is no evidence of free air on the supine views.  No radiopaque calculi. The lung bases are clear. IMPRESSION: Nonobstructive bowel gas pattern. No significant formed stool. Electronically Signed   By: Andrea Gasman M.D.   On: 04/15/2024 18:52   CT Head Wo Contrast Result Date: 04/15/2024 CLINICAL DATA:  Clemens from standing weakness EXAM: CT HEAD WITHOUT CONTRAST TECHNIQUE: Contiguous axial images were obtained from the base of the skull through the vertex without intravenous contrast. RADIATION DOSE REDUCTION: This exam was performed according to the departmental dose-optimization program which includes automated exposure control, adjustment of  the mA and/or kV according to patient size and/or use of iterative reconstruction technique. COMPARISON:  None Available. FINDINGS: Brain: No acute territorial infarction, hemorrhage or intracranial mass. The ventricles are nonenlarged. Vascular: No hyperdense vessels.  No unexpected calcification Skull: Normal. Negative for fracture or focal lesion. Sinuses/Orbits: No acute finding. Other: None IMPRESSION: Negative non contrasted CT appearance of the brain. Electronically Signed   By: Luke Bun M.D.   On: 04/15/2024 17:09   DG Chest Port 1 View Result Date: 04/15/2024 CLINICAL DATA:  Chest pain. EXAM: PORTABLE CHEST 1 VIEW COMPARISON:  04/07/2024. FINDINGS: The heart size and mediastinal contours are within normal limits. No consolidation, effusion, or pneumothorax is seen. A right internal jugular central venous catheter terminates over the superior vena cava. No acute osseous abnormality. IMPRESSION: No active disease. Electronically Signed   By: Leita Birmingham M.D.   On: 04/15/2024 16:26     Medications:      buPROPion   300 mg Oral Daily   calcium  carbonate  1 tablet Oral TID WC   Chlorhexidine  Gluconate Cloth  6 each Topical Daily   heparin   5,000 Units Subcutaneous Q8H   midodrine   5 mg Oral TID WC   venlafaxine  XR  150 mg Oral Q breakfast   And   venlafaxine  XR  75 mg Oral Q breakfast   acetaminophen , ondansetron  (ZOFRAN ) IV, ondansetron   Assessment/ Plan:  Mr. ERYN KREJCI is a 58 y.o.  male with a PMHx of hypertension, atrial fibrillation, BPH, hyperlipidemia, anxiety, constipation, morbid obesity, and end stage renal disease on hemodialysis.   [facility]/TTS/Rt permcath  End stage renal disease on hemodialysis.Last treatment received on Friday, prior to discharge. Will schedule next treatment on Tuesday.   Lab Results  Component Value Date   CREATININE 10.53 (H) 04/15/2024   CREATININE 8.19 (H) 04/13/2024   CREATININE 7.25 (H) 04/11/2024   No intake or  output data in the 24 hours ending 04/16/24 1356  2.  Acute metabolic acidosis.  Serum bicarbonate 16, will correct with dialysis   3.  Anemia of chronic kidney disease.  Hemoglobin 10.6. Acceptable for this patient. Will continue to monitor for need of ESA    4.  Secondary hyperparathyroidism. PTH 199 during recent admission.Will continue to monitor bone minerals.     LOS: 0 Abbegayle Denault 8/18/20251:56 PM

## 2024-04-16 NOTE — Consult Note (Signed)
 [facility] Psychiatric Consult Initial  Patient Name: [name]  MRN: [id number]  DOB: 01-09-1966  Consult Order details:  Orders (From admission, onward)     Start     Ordered   04/15/24 1604  IP CONSULT TO PSYCHIATRY       Ordering Provider: Nicholaus Rolland BRAVO, MD  Provider:  (Not yet assigned)  Question Answer Comment  Place call to: [phone]   Reason for Consult Admit   Diagnosis/Clinical Info for Consult: Passive suicidal ideation, will be admitted to hospitalist      04/15/24 1604             Mode of Visit: In person    Psychiatry Consult Evaluation  Service Date: April 16, 2024 LOS:  LOS: 0 days  Chief Complaint depression and SI  Primary Psychiatric Diagnoses  Adjustment disorder with depressive mood   Assessment  Brady Edwards is a 58 y.o. male admitted: MedicallyKenneth A Edwards is a 58 y.o. male with medical history significant of newly ESRD recently started on HD, atrial fibrillation, anxiety and depression, morbid obesity who presented with complaints of weakness and falls.Pt was very frustrated with his medical and mental problems, and wanted us  to make him better.  Pt complained of fatigue, lack of energy, and weakness, causing to fall several times since he was discharged from hospital on 04/13/24.  No focal neuro deficits.  Pt said he has had long standing mental health issues and needs to see psych.  Psychiatry was consulted.  For further assessment and management  On assessment patient is noted to be endorsing depression in the context of multiple psychosocial stressors including deteriorating health, recent treatment and dialysis 3 times per week, living in a trailer has been going to the facility.  Patient is able to display future oriented and is not requesting help regarding placement, home health aide, transportation for his treatments.  Patient denies SI/HI/plan.  No indication of inpatient psychiatric admission at this time.  Patient is  psychiatrically cleared for discharge     Diagnoses:  Active Hospital problems: Principal Problem:   Weakness    Plan   ## Psychiatric Medication Recommendations:  Patient is declining any adjustment to his home medication. Home medication  ## Medical Decision Making Capacity: Not specifically addressed in this encounter  ## Further Work-up:   No additional workup required   ## Disposition:-- There are no psychiatric contraindications to discharge at this time  ## Behavioral / Environmental: -Utilize compassion and acknowledge the patient's experiences while setting clear and realistic expectations for care.    ## Safety and Observation Level:  - Based on my clinical evaluation, I estimate the patient to be at low risk of self harm in the current setting. - At this time, we recommend  routine. This decision is based on my review of the chart including patient's history and current presentation, interview of the patient, mental status examination, and consideration of suicide risk including evaluating suicidal ideation, plan, intent, suicidal or self-harm behaviors, risk factors, and protective factors. This judgment is based on our ability to directly address suicide risk, implement suicide prevention strategies, and develop a safety plan while the patient is in the clinical setting. Please contact our team if there is a concern that risk level has changed.  CSSR Risk Category:C-SSRS RISK CATEGORY: High Risk  Suicide Risk Assessment: Patient has following modifiable risk factors for suicide: lack of access to outpatient mental health resources, which we are addressing by requesting TOC  to provide him outpatient mental health resources including [facility]. Patient has following non-modifiable or demographic risk factors for suicide: male gender Patient has the following protective factors against suicide: Cultural, spiritual, or religious beliefs that discourage  suicide  Thank you for this consult request. Recommendations have been communicated to the primary team.  We will sign off at this time.   Shira Bobst, MD       History of Present Illness  Relevant Aspects of [facility]   Patient Report:  On interview patient reports that he is going through a lot of social stressors including losing his trailer, not able to work as he works for a long time.  He reports that people used to call him daily but he describes himself as lethargic with no motivation, does not improve.  He did acknowledge that he will be requested to talk to the psychiatrist.  He reports having depression for many years since his family was murdered that includes his mom consider antibiotic therapy with wife.  Brother-in-law is not himself and time 4 years ago.  He reports currently feeling hopeless and worthless and anhedonia at times due to his ongoing medical problems.  He reports poor appetite and.  Patient insist on multiple times that he is not suicidal and does not want hurt himself but reports that he is expressing his frustration as he needs help regarding his situation with his trailer.  He reports he cannot go back to the trailer with no power no water  and especially because he is dialysis requirement and ongoing medical problems he needs help with still living or SNF and is also requesting some home health aide and help with transportation.  He reports intermittent anxiety but denies having any panic attacks he denies any racing thoughts and current or recent episodes of mania/hypomania.  He denies nightmares or flashbacks.      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 the Legal Hx: Denies Living Situation: Lives in trailer by himself Spiritual Hx: Denies Access to weapons/lethal means: Denies  Substance History Alcohol: Sober for 10 years T Tobacco: Denies Illicit drugs: Denies Prescription drug abuse: Denies Rehab hx: Denies  Exam Findings  Physical Exam: Reviewed and agree with the physical exam findings conducted by the medical provider Vital Signs:  Temp:  [98.1 F (36.7 C)-98.9 F (37.2 C)] 98.1 F (36.7 C) (08/18 1436) Pulse Rate:  [75-120] 90 (08/18 1436) Resp:  [16-18] 18 (08/18 1436) BP: (93-119)/(39-80) 97/60 (08/18 1436) SpO2:  [95 %-99 %] 96 % (08/18 1436) Blood pressure 97/60, pulse 90, temperature 98.1 F (36.7 C), resp. rate 18, height 5' 8 (1.727 m), weight 133.4 kg, SpO2 96%. Body mass index is 44.7 kg/m.    Mental Status Exam: General Appearance: Casual  Orientation:  Full (Time, Place, and Person)  Memory:  Immediate;   Fair Recent;   Fair Remote;   Fair  Concentration:  Concentration: Fair and Attention Span: Fair  Recall:  Fair  Attention  Fair  Eye Contact:  Fair  Speech:  Clear and Coherent  Language:  Fair  Volume:  Normal  Mood: depressed  Affect:  Congruent  Thought Process:  Coherent  Thought Content:  Logical  Suicidal Thoughts:  No  Homicidal Thoughts:  No  Judgement:  Impaired  Insight:  Fair  Psychomotor Activity:  Normal  Akathisia:  No  Fund of Knowledge:  Fair      Assets:  Communication Skills  Cognition:  WNL  ADL's:  Intact  AIMS (if indicated):        Other History   These have been pulled in through the EMR, reviewed, and updated if appropriate.  Family History:  The patient's family history includes Cancer in his maternal grandfather, maternal  grandmother, mother, and paternal grandmother; Diabetes in his brother; Heart disease in his father.  Medical History: Past Medical History:  Diagnosis Date   Atrial fibrillation (HCC)    Depression    Hypertension     Surgical History: Past Surgical History:  Procedure Laterality Date   DIALYSIS/PERMA CATHETER INSERTION N/A 04/12/2024   Procedure: DIALYSIS/PERMA CATHETER INSERTION;  Surgeon: Marea Selinda RAMAN, MD;  Location: [facility] INVASIVE CV LAB;  Service: Cardiovascular;  Laterality: N/A;   TEMPORARY DIALYSIS CATHETER N/A 04/09/2024   Procedure: TEMPORARY DIALYSIS CATHETER;  Surgeon: Marea Selinda RAMAN, MD;  Location: [facility] INVASIVE CV LAB;  Service: Cardiovascular;  Laterality: N/A;     Medications:   Current Facility-Administered Medications:    acetaminophen  (TYLENOL ) tablet 1,000 mg, 1,000 mg, Oral, TID PRN, Awanda City, MD   buPROPion  (WELLBUTRIN  XL) 24 hr tablet 300 mg, 300 mg, Oral, Daily, Awanda City, MD, 300 mg at 04/16/24 0831   calcium  carbonate (OS-CAL - dosed in mg of elemental calcium ) tablet 1,250 mg, 1 tablet, Oral, TID WC, Awanda City, MD, 1,250 mg at 04/16/24 1218   Chlorhexidine  Gluconate Cloth 2 % PADS 6 each, 6 each, Topical, Daily, Awanda City, MD   heparin  injection 5,000 Units, 5,000 Units, Subcutaneous, Q8H, Awanda City, MD, 5,000 Units at 04/16/24 1304   midodrine  (PROAMATINE ) tablet 5 mg, 5 mg, Oral, TID WC, Awanda City, MD, 5 mg at 04/16/24 1159   ondansetron  (ZOFRAN ) injection 4 mg, 4 mg, Intravenous, Q6H PRN, Awanda City, MD   ondansetron  (ZOFRAN -ODT) disintegrating tablet 4 mg, 4 mg, Oral, Q8H PRN, Awanda City, MD, 4 mg at 04/16/24 1159   venlafaxine  XR (EFFEXOR -XR) 24 hr capsule 150 mg, 150 mg, Oral, Q breakfast, 150 mg at 04/16/24 0832 **AND** venlafaxine  XR (EFFEXOR -XR) 24 hr capsule 75 mg, 75 mg, Oral, Q breakfast, Awanda City, MD, 75 mg at 04/16/24 0831  Allergies: No Known Allergies  Christhoper Busbee, MD

## 2024-04-17 ENCOUNTER — Other Ambulatory Visit: Payer: Self-pay

## 2024-04-17 DIAGNOSIS — I482 Chronic atrial fibrillation, unspecified: Secondary | ICD-10-CM | POA: Diagnosis not present

## 2024-04-17 DIAGNOSIS — I10 Essential (primary) hypertension: Secondary | ICD-10-CM | POA: Diagnosis not present

## 2024-04-17 DIAGNOSIS — R0602 Shortness of breath: Secondary | ICD-10-CM | POA: Diagnosis not present

## 2024-04-17 DIAGNOSIS — N2581 Secondary hyperparathyroidism of renal origin: Secondary | ICD-10-CM | POA: Diagnosis not present

## 2024-04-17 DIAGNOSIS — R531 Weakness: Secondary | ICD-10-CM | POA: Diagnosis not present

## 2024-04-17 DIAGNOSIS — E8721 Acute metabolic acidosis: Secondary | ICD-10-CM | POA: Diagnosis not present

## 2024-04-17 DIAGNOSIS — D631 Anemia in chronic kidney disease: Secondary | ICD-10-CM | POA: Diagnosis not present

## 2024-04-17 DIAGNOSIS — N186 End stage renal disease: Secondary | ICD-10-CM | POA: Diagnosis not present

## 2024-04-17 LAB — CBC
HCT: 31 % — ABNORMAL LOW (ref 39.0–52.0)
Hemoglobin: 10.2 g/dL — ABNORMAL LOW (ref 13.0–17.0)
MCH: 31.1 pg (ref 26.0–34.0)
MCHC: 32.9 g/dL (ref 30.0–36.0)
MCV: 94.5 fL (ref 80.0–100.0)
Platelets: 227 K/uL (ref 150–400)
RBC: 3.28 MIL/uL — ABNORMAL LOW (ref 4.22–5.81)
RDW: 13 % (ref 11.5–15.5)
WBC: 9.6 K/uL (ref 4.0–10.5)
nRBC: 0 % (ref 0.0–0.2)

## 2024-04-17 LAB — RENAL FUNCTION PANEL
Albumin: 3.3 g/dL — ABNORMAL LOW (ref 3.5–5.0)
Anion gap: 15 (ref 5–15)
BUN: 71 mg/dL — ABNORMAL HIGH (ref 6–20)
CO2: 19 mmol/L — ABNORMAL LOW (ref 22–32)
Calcium: 9 mg/dL (ref 8.9–10.3)
Chloride: 103 mmol/L (ref 98–111)
Creatinine, Ser: 14.37 mg/dL — ABNORMAL HIGH (ref 0.61–1.24)
GFR, Estimated: 4 mL/min — ABNORMAL LOW (ref >60)
Glucose, Bld: 96 mg/dL (ref 70–99)
Phosphorus: 7.5 mg/dL — ABNORMAL HIGH (ref 2.5–4.6)
Potassium: 4 mmol/L (ref 3.5–5.1)
Sodium: 137 mmol/L (ref 135–145)

## 2024-04-17 MED ORDER — MIDODRINE HCL 5 MG PO TABS
10.0000 mg | ORAL_TABLET | Freq: Three times a day (TID) | ORAL | Status: DC
  Administered 2024-04-17 – 2024-05-28 (×93): 10 mg via ORAL
  Filled 2024-04-17 (×98): qty 2

## 2024-04-17 NOTE — TOC Initial Note (Signed)
 Transition of Care [facility]) - Initial/Assessment Note    Patient Details  Name: Brady Edwards MRN: [id number] Date of Birth: 1965-09-28  Transition of Care [facility]) CM/SW Contact:    Alvaro Louder, LCSW Phone Number: 04/17/2024, 4:29 PM  Clinical Narrative:     [name] Faxed out information to SNF's in [place]. LCSWA will present Facilities to patient at the bedside.             TOC to follow for Discharge      Patient Goals and CMS Choice            Expected Discharge Plan and Services                                              Prior Living Arrangements/Services                       Activities of Daily Living   ADL Screening (condition at time of admission) Independently performs ADLs?: Yes (appropriate for developmental age) Is the patient deaf or have difficulty hearing?: No Does the patient have difficulty seeing, even when wearing glasses/contacts?: No Does the patient have difficulty concentrating, remembering, or making decisions?: No  Permission Sought/Granted                  Emotional Assessment              Admission diagnosis:  Suicidal ideation [R45.851] Anasarca [R60.1] Weakness [R53.1] General weakness [R53.1] Frequent falls [R29.6] Renal failure, unspecified chronicity [N19] Patient Active Problem List   Diagnosis Date Noted   Weakness 04/15/2024   Encounter for dialysis and dialysis catheter care (HCC) 04/10/2024   Hyperkalemia 04/08/2024   Metabolic acidosis 04/08/2024   Hypocalcemia 04/08/2024   Renal failure (ARF), acute on chronic (HCC) 04/07/2024   Obesity, Class III, BMI 40-49.9 (morbid obesity) 04/07/2024   Depression 04/07/2024   Atrial fibrillation, chronic (HCC) 04/07/2024   Fatigue 04/07/2024   OSA (obstructive sleep apnea) 02/27/2024   BPH (benign prostatic hyperplasia) 10/06/2018   Hyperlipidemia 08/29/2018   Hypertension 08/29/2018   Chest pain at rest 01/16/2018   Tobacco abuse  06/13/2015   Stress at home 06/13/2015   Heart palpitations 05/16/2015   Panic disorder with agoraphobia 05/16/2015   PCP:  Louder Duwaine SQUIBB, DO Pharmacy:   [facility] - [place], [place] - [address] [place] [address] Phone: [phone] Fax: [phone]  [facility] [address] [place] [address] Phone: [phone] Fax: [phone]     Social Drivers of Health (SDOH) Social History: SDOH Screenings   Food Insecurity: Food Insecurity Present (04/16/2024)  Housing: High Risk (04/16/2024)  Transportation Needs: Unmet Transportation Needs (04/16/2024)  Utilities: At Risk (04/16/2024)  Depression (PHQ2-9): High Risk (02/27/2024)  Tobacco Use: High Risk (04/07/2024)   SDOH Interventions:     Readmission Risk Interventions     No data to display

## 2024-04-17 NOTE — [place] FL2 (Signed)
   MEDICAID [id number] LEVEL OF CARE FORM     IDENTIFICATION  Patient Name: Brady Edwards Birthdate: [birth year] Sex: male Admission Date (Current Location): 04/15/2024  [facility] and [place] Number:  Chiropodist and Address:  [facility], [address], [place], [place] [address]      Provider Number: [id number]  Attending Physician Name and Address:  Awanda City, MD  Relative Name and Phone Number:       Current Level of Care: Hospital Recommended Level of Care: Skilled Nursing Facility Prior Approval Number:    Date Approved/Denied:   PASRR Number:    Discharge Plan: SNF    Current Diagnoses: Patient Active Problem List   Diagnosis Date Noted   Weakness 04/15/2024   Encounter for dialysis and dialysis catheter care (HCC) 04/10/2024   Hyperkalemia 04/08/2024   Metabolic acidosis 04/08/2024   Hypocalcemia 04/08/2024   Renal failure (ARF), acute on chronic (HCC) 04/07/2024   Obesity, Class III, BMI 40-49.9 (morbid obesity) 04/07/2024   Depression 04/07/2024   Atrial fibrillation, chronic (HCC) 04/07/2024   Fatigue 04/07/2024   OSA (obstructive sleep apnea) 02/27/2024   BPH (benign prostatic hyperplasia) 10/06/2018   Hyperlipidemia 08/29/2018   Hypertension 08/29/2018   Chest pain at rest 01/16/2018   Tobacco abuse 06/13/2015   Stress at home 06/13/2015   Heart palpitations 05/16/2015   Panic disorder with agoraphobia 05/16/2015    Orientation RESPIRATION BLADDER Height & Weight     Self, Time, Situation, Place  Normal Continent Weight: 294 lb (133.4 kg) Height:  5' 8 (172.7 cm)  BEHAVIORAL SYMPTOMS/MOOD NEUROLOGICAL BOWEL NUTRITION STATUS      Continent Diet  AMBULATORY STATUS COMMUNICATION OF NEEDS Skin   Limited Assist Verbally Normal                       Personal Care Assistance Level of Assistance              Functional Limitations Info  Sight Sight Info: Impaired        SPECIAL CARE FACTORS  FREQUENCY  PT (By licensed PT), OT (By licensed OT)     PT Frequency: 5x/week OT Frequency: 5x/week            Contractures      Additional Factors Info  Code Status, Allergies Code Status Info: Full             Current Medications (04/17/2024):  This is the current hospital active medication list Current Facility-Administered Medications  Medication Dose Route Frequency Provider Last Rate Last Admin   acetaminophen  (TYLENOL ) tablet 1,000 mg  1,000 mg Oral TID PRN Awanda City, MD       alteplase  (CATHFLO ACTIVASE ) injection 2 mg  2 mg Intracatheter Once PRN Druscilla Bald, NP       buPROPion  (WELLBUTRIN  XL) 24 hr tablet 300 mg  300 mg Oral Daily Awanda City, MD   300 mg at 04/17/24 1021   calcium  carbonate (OS-CAL - dosed in mg of elemental calcium ) tablet 1,250 mg  1 tablet Oral TID WC Awanda City, MD   1,250 mg at 04/17/24 1157   Chlorhexidine  Gluconate Cloth 2 % PADS 6 each  6 each Topical Daily Awanda City, MD   6 each at 04/17/24 1022   heparin  injection 1,000 Units  1,000 Units Intracatheter PRN Druscilla Bald, NP       heparin  injection 5,000 Units  5,000 Units Subcutaneous Q8H Awanda City, MD  5,000 Units at 04/17/24 1528   midodrine  (PROAMATINE ) tablet 10 mg  10 mg Oral TID WC Awanda City, MD   10 mg at 04/17/24 1528   ondansetron  (ZOFRAN ) injection 4 mg  4 mg Intravenous Q6H PRN Awanda City, MD       ondansetron  (ZOFRAN -ODT) disintegrating tablet 4 mg  4 mg Oral Q8H PRN Awanda City, MD   4 mg at 04/16/24 1159   venlafaxine  XR (EFFEXOR -XR) 24 hr capsule 150 mg  150 mg Oral Q breakfast Awanda City, MD   150 mg at 04/17/24 9189   And   venlafaxine  XR (EFFEXOR -XR) 24 hr capsule 75 mg  75 mg Oral Q breakfast Awanda City, MD   75 mg at 04/17/24 9189     Discharge Medications: Please see discharge summary for a list of discharge medications.  Relevant Imaging Results:  Relevant Lab Results:   Additional Information SSN: [id number]  Martia Dalby  [place], LCSW

## 2024-04-17 NOTE — Progress Notes (Signed)
  PROGRESS NOTE    AVON MERGENTHALER  FMW:[id number] DOB: Jan 27, 1966 DOA: 04/15/2024 PCP: Vicci Bouchard P, DO  [id number]  LOS: 0 days   Brief hospital course:   Assessment & Plan: Brady Edwards is a 58 y.o. male with medical history significant of newly ESRD recently started on HD, atrial fibrillation, anxiety and depression, morbid obesity who presented with complaints of weakness and falls.    # Weakness and fall --Fatigue and weakness have been progressive, due to recent hospitalization and decline in health.  CT head wnl, no acute finding. --PT/OT   # ESRD on HD TTS --recently started on dialysis --iHD per nephro   # Hypotension --250 ml bolus given in the ED --Hold home Toprol  due to low BP --increase midodrine  to 10 mg TID (new)   # Sinus tachycardia --EKG on presentation showed sinus tach.  Repeat EKG today still showed sinus. --no need for intervention since sinus rhythm, and tachycardia likely due to demand and deconditioning.   # Hx of Afib --pt not taking anticoagulation PTA.  Currently in sinus. --hold home Toprol  due to low BP   # Depression and hx of panic disorder # Passive SI --pt desires psych eval --psych consulted --no need for SI precaution, per psych --cont home bupropion  and Effexor    # OSA --CPAP nightly   # Obesity, Class III, BMI 40-49.9 (morbid obesity)    DVT prophylaxis: Heparin  SQ Code Status: Full code  Family Communication:  Level of care: Med-Surg Dispo:   The patient is from: home Anticipated d/c is to: SNF rehab Anticipated d/c date is: whenever SNF accepts    Subjective and Interval History:  Pt continued to have tachycardia, asymptomatic, EKG showed normal sinus.  Pt reported feeling better today.   Objective: Vitals:   04/17/24 1931 04/17/24 2000 04/17/24 2021 04/17/24 2024  BP: 95/72 93/78 (!) 94/57 104/66  Pulse: (!) 108 (!) 107 (!) 102 89  Resp: 18 18 17 16   Temp:    98.6 F (37 C)  TempSrc:       SpO2: 94% 97% 96% 98%  Weight:    124 kg  Height:        Intake/Output Summary (Last 24 hours) at 04/17/2024 2142 Last data filed at 04/17/2024 2024 Gross per 24 hour  Intake 240 ml  Output 1500 ml  Net -1260 ml   Filed Weights   04/15/24 1514 04/17/24 1636 04/17/24 2024  Weight: 133.4 kg 125.5 kg 124 kg    Examination:   Constitutional: NAD, AAOx3 HEENT: conjunctivae and lids normal, EOMI CV: No cyanosis.   RESP: normal respiratory effort, on RA Neuro: II - XII grossly intact.   Psych: Normal mood and affect.  Appropriate judgement and reason   Data Reviewed: I have personally reviewed labs and imaging studies  Time spent: 35 minutes  Ellouise Haber, MD [facility] If 7PM-7AM, please contact night-coverage 04/17/2024, 9:42 PM

## 2024-04-17 NOTE — Plan of Care (Signed)

## 2024-04-17 NOTE — Progress Notes (Signed)
 Pt receives outpt HD at [facility] on TTS. Navigator following to assist with any HD needs.  Suzen Satchel Dialysis Navigator [phone]

## 2024-04-17 NOTE — Progress Notes (Signed)
 [facility]  ROUNDING NOTE   Subjective:   Brady Edwards is a 58 y.o. male with a PMHx of hypertension, atrial fibrillation, BPH, hyperlipidemia, anxiety, constipation, morbid obesity and recently diagnosed end stage renal disease on hemodialysis. He was recently discharged but has returned due to weakness and falls. He has been admitted for Suicidal ideation [R45.851] Anasarca [R60.1] Weakness [R53.1] General weakness [R53.1] Frequent falls [R29.6] Renal failure, unspecified chronicity [N19]  Patient is known to our practice and receives outpatient dialysis treatments at [facility] on a TTS schedule.  SABRA Update: Patient seen sitting up in chair Partially completed breakfast tray at bedside Room air Voices concerns that he is losing his house and not sure of his living situation after discharge.    Objective:  Vital signs in last 24 hours:  Temp:  [97.9 F (36.6 C)-98.1 F (36.7 C)] 97.9 F (36.6 C) (08/19 0747) Pulse Rate:  [76-90] 76 (08/19 0747) Resp:  [18-20] 20 (08/19 0747) BP: (90-117)/(57-62) 90/62 (08/19 0747) SpO2:  [96 %-100 %] 98 % (08/19 0747)  Weight change:  Filed Weights   04/15/24 1514  Weight: 133.4 kg    Intake/Output: No intake/output data recorded.   Intake/Output this shift:  No intake/output data recorded.  Physical Exam: General: NAD  Head: Normocephalic, atraumatic. Moist oral mucosal membranes  Eyes: Anicteric  Lungs:  Clear to auscultation, normal effort  Heart: Regular rate and rhythm  Abdomen:  Soft, nontender  Extremities:  No peripheral edema.  Neurologic: Awake, alert, conversant  Skin: Warm,dry, no rash  Access: Rt Permcath     Basic Metabolic Panel: Recent Labs  Lab 04/11/24 0427 04/13/24 0800 04/15/24 1529  NA 138 142 140  K 3.3* 3.8 4.1  CL 103 109 107  CO2 24 21* 16*  GLUCOSE 91 87 158*  BUN 57* 54* 54*  CREATININE 7.25* 8.19* 10.53*  CALCIUM  6.8* 8.2* 8.7*  MG  --   --  2.2  PHOS 5.8*  6.1*  --     Liver Function Tests: Recent Labs  Lab 04/11/24 0427 04/13/24 0800 04/15/24 1529  AST  --   --  27  ALT  --   --  26  ALKPHOS  --   --  80  BILITOT  --   --  0.9  PROT  --   --  6.4*  ALBUMIN  2.5* 3.0* 3.1*   No results for input(s): LIPASE, AMYLASE in the last 168 hours.  Recent Labs  Lab 04/15/24 1606  AMMONIA 20    CBC: Recent Labs  Lab 04/11/24 0427 04/13/24 0800 04/15/24 1529  WBC 6.0 8.5 9.9  NEUTROABS  --   --  6.8  HGB 9.4* 10.1* 10.6*  HCT 28.1* 31.7* 31.9*  MCV 91.5 95.8 93.0  PLT 173 192 223    Cardiac Enzymes: No results for input(s): CKTOTAL, CKMB, CKMBINDEX, TROPONINI in the last 168 hours.   BNP: Invalid input(s): POCBNP  CBG: No results for input(s): GLUCAP in the last 168 hours.  Microbiology: Results for orders placed or performed during the hospital encounter of 04/07/24  Resp panel by RT-PCR (RSV, Flu A&B, Covid) Anterior Nasal Swab     Status: None   Collection Time: 04/07/24  4:46 PM   Specimen: Anterior Nasal Swab  Result Value Ref Range Status   SARS Coronavirus 2 by RT PCR NEGATIVE NEGATIVE Final    Comment: (NOTE) SARS-CoV-2 target nucleic acids are NOT DETECTED.  The SARS-CoV-2 RNA is generally detectable  in upper respiratory specimens during the acute phase of infection. The lowest concentration of SARS-CoV-2 viral copies this assay can detect is 138 copies/mL. A negative result does not preclude SARS-Cov-2 infection and should not be used as the sole basis for treatment or other patient management decisions. A negative result may occur with  improper specimen collection/handling, submission of specimen other than nasopharyngeal swab, presence of viral mutation(s) within the areas targeted by this assay, and inadequate number of viral copies(<138 copies/mL). A negative result must be combined with clinical observations, patient history, and epidemiological information. The expected result is  Negative.  Fact Sheet for Patients:  BloggerCourse.com  Fact Sheet for Healthcare Providers:  SeriousBroker.it  This test is no t yet approved or cleared by the [place]  FDA and  has been authorized for detection and/or diagnosis of SARS-CoV-2 by FDA under an Emergency Use Authorization (EUA). This EUA will remain  in effect (meaning this test can be used) for the duration of the COVID-19 declaration under Section 564(b)(1) of the Act, 21 U.S.C.section 360bbb-3(b)(1), unless the authorization is terminated  or revoked sooner.       Influenza A by PCR NEGATIVE NEGATIVE Final   Influenza B by PCR NEGATIVE NEGATIVE Final    Comment: (NOTE) The Xpert Xpress SARS-CoV-2/FLU/RSV plus assay is intended as an aid in the diagnosis of influenza from Nasopharyngeal swab specimens and should not be used as a sole basis for treatment. Nasal washings and aspirates are unacceptable for Xpert Xpress SARS-CoV-2/FLU/RSV testing.  Fact Sheet for Patients: BloggerCourse.com  Fact Sheet for Healthcare Providers: SeriousBroker.it  This test is not yet approved or cleared by the [place]  FDA and has been authorized for detection and/or diagnosis of SARS-CoV-2 by FDA under an Emergency Use Authorization (EUA). This EUA will remain in effect (meaning this test can be used) for the duration of the COVID-19 declaration under Section 564(b)(1) of the Act, 21 U.S.C. section 360bbb-3(b)(1), unless the authorization is terminated or revoked.     Resp Syncytial Virus by PCR NEGATIVE NEGATIVE Final    Comment: (NOTE) Fact Sheet for Patients: BloggerCourse.com  Fact Sheet for Healthcare Providers: SeriousBroker.it  This test is not yet approved or cleared by the [place]  FDA and has been authorized for detection and/or diagnosis of  SARS-CoV-2 by FDA under an Emergency Use Authorization (EUA). This EUA will remain in effect (meaning this test can be used) for the duration of the COVID-19 declaration under Section 564(b)(1) of the Act, 21 U.S.C. section 360bbb-3(b)(1), unless the authorization is terminated or revoked.  Performed at [facility], [address] [place], [place] [address]     Coagulation Studies: Recent Labs    04/15/24 1529  LABPROT 14.7  INR 1.1    Urinalysis: No results for input(s): COLORURINE, LABSPEC, PHURINE, GLUCOSEU, HGBUR, BILIRUBINUR, KETONESUR, PROTEINUR, UROBILINOGEN, NITRITE, LEUKOCYTESUR in the last 72 hours.  Invalid input(s): APPERANCEUR    Imaging: DG Abd 1 View Result Date: 04/15/2024 CLINICAL DATA:  Constipation.  Abdominal pain. EXAM: ABDOMEN - 1 VIEW COMPARISON:  CT 04/08/2024 FINDINGS: No bowel dilatation or evidence of obstruction. Air within the colon without significant formed stool. The previous contrast in the colon has cleared. There is no evidence of free air on the supine views. No radiopaque calculi. The lung bases are clear. IMPRESSION: Nonobstructive bowel gas pattern. No significant formed stool. Electronically Signed   By: Andrea Gasman M.D.   On: 04/15/2024 18:52   CT Head Wo Contrast Result Date: 04/15/2024  CLINICAL DATA:  Clemens from standing weakness EXAM: CT HEAD WITHOUT CONTRAST TECHNIQUE: Contiguous axial images were obtained from the base of the skull through the vertex without intravenous contrast. RADIATION DOSE REDUCTION: This exam was performed according to the departmental dose-optimization program which includes automated exposure control, adjustment of the mA and/or kV according to patient size and/or use of iterative reconstruction technique. COMPARISON:  None Available. FINDINGS: Brain: No acute territorial infarction, hemorrhage or intracranial mass. The ventricles are nonenlarged. Vascular: No hyperdense  vessels.  No unexpected calcification Skull: Normal. Negative for fracture or focal lesion. Sinuses/Orbits: No acute finding. Other: None IMPRESSION: Negative non contrasted CT appearance of the brain. Electronically Signed   By: Luke Bun M.D.   On: 04/15/2024 17:09   DG Chest Port 1 View Result Date: 04/15/2024 CLINICAL DATA:  Chest pain. EXAM: PORTABLE CHEST 1 VIEW COMPARISON:  04/07/2024. FINDINGS: The heart size and mediastinal contours are within normal limits. No consolidation, effusion, or pneumothorax is seen. A right internal jugular central venous catheter terminates over the superior vena cava. No acute osseous abnormality. IMPRESSION: No active disease. Electronically Signed   By: Leita Birmingham M.D.   On: 04/15/2024 16:26     Medications:      buPROPion   300 mg Oral Daily   calcium  carbonate  1 tablet Oral TID WC   Chlorhexidine  Gluconate Cloth  6 each Topical Daily   heparin   5,000 Units Subcutaneous Q8H   midodrine   5 mg Oral TID WC   venlafaxine  XR  150 mg Oral Q breakfast   And   venlafaxine  XR  75 mg Oral Q breakfast   acetaminophen , alteplase , heparin , ondansetron  (ZOFRAN ) IV, ondansetron   Assessment/ Plan:  Mr. RAYBURN MUNDIS is a 58 y.o.  male with a PMHx of hypertension, atrial fibrillation, BPH, hyperlipidemia, anxiety, constipation, morbid obesity, and end stage renal disease on hemodialysis.   [facility]/TTS/Rt permcath  End stage renal disease on hemodialysis.Per outpatient schedule, patient is to receive a treatment today. However due to current schedule, compared to patient stability, he may be moved until tomorrow for treatment. Will continue to monitor schedule closely.   Lab Results  Component Value Date   CREATININE 10.53 (H) 04/15/2024   CREATININE 8.19 (H) 04/13/2024   CREATININE 7.25 (H) 04/11/2024   No intake or output data in the 24 hours ending 04/17/24 1236  2.  Acute metabolic acidosis.  Serum bicarbonate 16, will correct  with dialysis   3.  Anemia of chronic kidney disease.  Hemoglobin 10.6. No need for ESA at this time  4.  Secondary hyperparathyroidism. PTH 199 during recent admission. Calcium  stable     LOS: 0 Tyshauna Finkbiner 8/19/202512:36 PM

## 2024-04-18 DIAGNOSIS — R531 Weakness: Secondary | ICD-10-CM | POA: Diagnosis not present

## 2024-04-18 DIAGNOSIS — E8721 Acute metabolic acidosis: Secondary | ICD-10-CM | POA: Diagnosis not present

## 2024-04-18 DIAGNOSIS — N2581 Secondary hyperparathyroidism of renal origin: Secondary | ICD-10-CM | POA: Diagnosis not present

## 2024-04-18 DIAGNOSIS — D631 Anemia in chronic kidney disease: Secondary | ICD-10-CM | POA: Diagnosis not present

## 2024-04-18 DIAGNOSIS — I951 Orthostatic hypotension: Secondary | ICD-10-CM

## 2024-04-18 DIAGNOSIS — N186 End stage renal disease: Secondary | ICD-10-CM | POA: Diagnosis not present

## 2024-04-18 NOTE — Progress Notes (Signed)
 PT Cancellation Note  Patient Details Name: Brady Edwards MRN: [id number] DOB: [birth year]   Cancelled Treatment:    Reason Eval/Treat Not Completed: Other (comment) Pt laying in bed in NAD, reports he has been up and walking to the bathroom and with staff and doesn't feel like doing much more at this point.  Pt kindly refuses, will maintain on PT caseload and continue to see as appropriate.  Brady Edwards 04/18/2024, 4:29 PM

## 2024-04-18 NOTE — TOC CM/SW Note (Signed)
To Whom It May Concern:  Please be advised that the above-named patient will require a short-term nursing home stay - anticipated 30 days or less for rehabilitation and strengthening.  The plan is for return home.  

## 2024-04-18 NOTE — Progress Notes (Signed)
 [facility]  ROUNDING NOTE   Subjective:   Brady Edwards is a 58 y.o. male with a PMHx of hypertension, atrial fibrillation, BPH, hyperlipidemia, anxiety, constipation, morbid obesity and recently diagnosed end stage renal disease on hemodialysis. He was recently discharged but has returned due to weakness and falls. He has been admitted for Suicidal ideation [R45.851] Anasarca [R60.1] Weakness [R53.1] General weakness [R53.1] Frequent falls [R29.6] Renal failure, unspecified chronicity [N19]  Patient is known to our practice and receives outpatient dialysis treatments at [facility] on a TTS schedule.  SABRA Update: Patient seen sitting in chair Alert States he feels well   Objective:  Vital signs in last 24 hours:  Temp:  [97.4 F (36.3 C)-98.6 F (37 C)] 97.9 F (36.6 C) (08/20 0719) Pulse Rate:  [67-111] 84 (08/20 0719) Resp:  [12-24] 17 (08/20 0719) BP: (76-117)/(57-89) 92/61 (08/20 0719) SpO2:  [94 %-99 %] 95 % (08/20 0719) Weight:  [124 kg-125.5 kg] 124 kg (08/19 2024)  Weight change:  Filed Weights   04/15/24 1514 04/17/24 1636 04/17/24 2024  Weight: 133.4 kg 125.5 kg 124 kg    Intake/Output: I/O last 3 completed shifts: In: 340 [P.O.:340] Out: 1500 [Other:1500]   Intake/Output this shift:  No intake/output data recorded.  Physical Exam: General: NAD  Head: Normocephalic, atraumatic. Moist oral mucosal membranes  Eyes: Anicteric  Lungs:  Clear to auscultation, normal effort  Heart: Regular rate and rhythm  Abdomen:  Soft, nontender  Extremities:  No peripheral edema.  Neurologic: Awake, alert, conversant  Skin: Warm,dry, no rash  Access: Rt Permcath     Basic Metabolic Panel: Recent Labs  Lab 04/13/24 0800 04/15/24 1529 04/17/24 1722  NA 142 140 137  K 3.8 4.1 4.0  CL 109 107 103  CO2 21* 16* 19*  GLUCOSE 87 158* 96  BUN 54* 54* 71*  CREATININE 8.19* 10.53* 14.37*  CALCIUM  8.2* 8.7* 9.0  MG  --  2.2  --   PHOS 6.1*   --  7.5*    Liver Function Tests: Recent Labs  Lab 04/13/24 0800 04/15/24 1529 04/17/24 1722  AST  --  27  --   ALT  --  26  --   ALKPHOS  --  80  --   BILITOT  --  0.9  --   PROT  --  6.4*  --   ALBUMIN  3.0* 3.1* 3.3*   No results for input(s): LIPASE, AMYLASE in the last 168 hours.  Recent Labs  Lab 04/15/24 1606  AMMONIA 20    CBC: Recent Labs  Lab 04/13/24 0800 04/15/24 1529 04/17/24 1720  WBC 8.5 9.9 9.6  NEUTROABS  --  6.8  --   HGB 10.1* 10.6* 10.2*  HCT 31.7* 31.9* 31.0*  MCV 95.8 93.0 94.5  PLT 192 223 227    Cardiac Enzymes: No results for input(s): CKTOTAL, CKMB, CKMBINDEX, TROPONINI in the last 168 hours.   BNP: Invalid input(s): POCBNP  CBG: No results for input(s): GLUCAP in the last 168 hours.  Microbiology: Results for orders placed or performed during the hospital encounter of 04/07/24  Resp panel by RT-PCR (RSV, Flu A&B, Covid) Anterior Nasal Swab     Status: None   Collection Time: 04/07/24  4:46 PM   Specimen: Anterior Nasal Swab  Result Value Ref Range Status   SARS Coronavirus 2 by RT PCR NEGATIVE NEGATIVE Final    Comment: (NOTE) SARS-CoV-2 target nucleic acids are NOT DETECTED.  The SARS-CoV-2 RNA is  generally detectable in upper respiratory specimens during the acute phase of infection. The lowest concentration of SARS-CoV-2 viral copies this assay can detect is 138 copies/mL. A negative result does not preclude SARS-Cov-2 infection and should not be used as the sole basis for treatment or other patient management decisions. A negative result may occur with  improper specimen collection/handling, submission of specimen other than nasopharyngeal swab, presence of viral mutation(s) within the areas targeted by this assay, and inadequate number of viral copies(<138 copies/mL). A negative result must be combined with clinical observations, patient history, and epidemiological information. The expected result is  Negative.  Fact Sheet for Patients:  BloggerCourse.com  Fact Sheet for Healthcare Providers:  SeriousBroker.it  This test is no t yet approved or cleared by the [place]  FDA and  has been authorized for detection and/or diagnosis of SARS-CoV-2 by FDA under an Emergency Use Authorization (EUA). This EUA will remain  in effect (meaning this test can be used) for the duration of the COVID-19 declaration under Section 564(b)(1) of the Act, 21 U.S.C.section 360bbb-3(b)(1), unless the authorization is terminated  or revoked sooner.       Influenza A by PCR NEGATIVE NEGATIVE Final   Influenza B by PCR NEGATIVE NEGATIVE Final    Comment: (NOTE) The Xpert Xpress SARS-CoV-2/FLU/RSV plus assay is intended as an aid in the diagnosis of influenza from Nasopharyngeal swab specimens and should not be used as a sole basis for treatment. Nasal washings and aspirates are unacceptable for Xpert Xpress SARS-CoV-2/FLU/RSV testing.  Fact Sheet for Patients: BloggerCourse.com  Fact Sheet for Healthcare Providers: SeriousBroker.it  This test is not yet approved or cleared by the [place]  FDA and has been authorized for detection and/or diagnosis of SARS-CoV-2 by FDA under an Emergency Use Authorization (EUA). This EUA will remain in effect (meaning this test can be used) for the duration of the COVID-19 declaration under Section 564(b)(1) of the Act, 21 U.S.C. section 360bbb-3(b)(1), unless the authorization is terminated or revoked.     Resp Syncytial Virus by PCR NEGATIVE NEGATIVE Final    Comment: (NOTE) Fact Sheet for Patients: BloggerCourse.com  Fact Sheet for Healthcare Providers: SeriousBroker.it  This test is not yet approved or cleared by the [place]  FDA and has been authorized for detection and/or diagnosis of  SARS-CoV-2 by FDA under an Emergency Use Authorization (EUA). This EUA will remain in effect (meaning this test can be used) for the duration of the COVID-19 declaration under Section 564(b)(1) of the Act, 21 U.S.C. section 360bbb-3(b)(1), unless the authorization is terminated or revoked.  Performed at [facility], [address] [place], [place] [address]     Coagulation Studies: Recent Labs    04/15/24 1529  LABPROT 14.7  INR 1.1    Urinalysis: No results for input(s): COLORURINE, LABSPEC, PHURINE, GLUCOSEU, HGBUR, BILIRUBINUR, KETONESUR, PROTEINUR, UROBILINOGEN, NITRITE, LEUKOCYTESUR in the last 72 hours.  Invalid input(s): APPERANCEUR    Imaging: No results found.    Medications:      buPROPion   300 mg Oral Daily   calcium  carbonate  1 tablet Oral TID WC   Chlorhexidine  Gluconate Cloth  6 each Topical Daily   heparin   5,000 Units Subcutaneous Q8H   midodrine   10 mg Oral TID WC   venlafaxine  XR  150 mg Oral Q breakfast   And   venlafaxine  XR  75 mg Oral Q breakfast   acetaminophen , ondansetron  (ZOFRAN ) IV, ondansetron   Assessment/ Plan:  Mr. JALEAL SCHLIEP is a 58  y.o.  male with a PMHx of hypertension, atrial fibrillation, BPH, hyperlipidemia, anxiety, constipation, morbid obesity, and end stage renal disease on hemodialysis.   [facility] [place]/TTS/Rt permcath  End stage renal disease on hemodialysis.Patient was able to dialyze yesterday, per outpatient schedule. Next treatment scheduled for Thursday.  Lab Results  Component Value Date   CREATININE 14.37 (H) 04/17/2024   CREATININE 10.53 (H) 04/15/2024   CREATININE 8.19 (H) 04/13/2024    Intake/Output Summary (Last 24 hours) at 04/18/2024 1153 Last data filed at 04/18/2024 0900 Gross per 24 hour  Intake 340 ml  Output 1500 ml  Net -1160 ml    2.  Acute metabolic acidosis.  Serum bicarbonate 16 on admission. Corrected to 19 today.    3.  Anemia of  chronic kidney disease.  Hemoglobin 10.2. Will continue to monitor.   4.  Secondary hyperparathyroidism. PTH 199 during recent admission. Calcium  stable. Phos is increased.     LOS: 0 Harutyun Monteverde 8/20/202511:53 AM

## 2024-04-18 NOTE — Progress Notes (Signed)
  PROGRESS NOTE    Brady Edwards  FMW:[id number] DOB: 1966/04/21 DOA: 04/15/2024 PCP: Vicci Bouchard P, DO  147A/147A-AA  LOS: 0 days   Brief hospital course:   Assessment & Plan: Brady Edwards is a 58 y.o. male with medical history significant of recently diagnosed ESRD now on HD, atrial fibrillation, anxiety and depression, morbid obesity who presented with complaints of weakness and falls.    # Weakness and fall --Fatigue and weakness have been progressive, due to recent hospitalization and decline in health.  CT head wnl, no acute finding. Noted to have elevated BUN (unclear of time line regarding rise in BUN). Also noted to have significant orthostatic hypotension --PT/OT   # ESRD on HD TTS --recently started on dialysis --iHD per nephro   # Hypotension Persistent, symptomatic when going from seated to standing position. No concern for infection.  --Hold home Toprol  due to low BP --Continue midodrine  to 10 mg TID (new) --TED hose abd binder    # Sinus tachycardia Resolved   # Hx of Afib --pt not taking anticoagulation PTA.  Currently in sinus. --hold home Toprol  due to low BP   # Depression and hx of panic disorder # Passive SI --psych consulted --no need for SI precaution, per psych --cont home bupropion  and Effexor    # OSA --CPAP nightly   # Obesity, Class III, BMI 40-49.9 (morbid obesity)    DVT prophylaxis: Heparin  SQ Code Status: Full code  Family Communication:  Level of care: Med-Surg Dispo:   The patient is from: home Anticipated d/c is to: SNF rehab Anticipated d/c date is: whenever SNF accepts    Subjective and Interval History:  Patient states that his energy level is down today. No specific complaints.   Objective: Vitals:   04/18/24 0346 04/18/24 0559 04/18/24 0719 04/18/24 1503  BP: 90/71 (!) 76/63 92/61 (!) 83/55  Pulse: 100 91 84 79  Resp: 20  17 17   Temp: 98.3 F (36.8 C)  97.9 F (36.6 C) 98.1 F (36.7 C)  TempSrc: Oral   Oral Oral  SpO2: 99% 95% 95% 94%  Weight:      Height:        Intake/Output Summary (Last 24 hours) at 04/18/2024 1630 Last data filed at 04/18/2024 1300 Gross per 24 hour  Intake 220 ml  Output 1500 ml  Net -1280 ml   Filed Weights   04/15/24 1514 04/17/24 1636 04/17/24 2024  Weight: 133.4 kg 125.5 kg 124 kg    Examination:  Constitutional: In no distress.  Cardiovascular: Normal rate, regular rhythm. No lower extremity edema  Pulmonary: Non labored breathing on room air, no wheezing or rales.   Abdominal: Soft. Non distended and non tender Musculoskeletal: Normal range of motion.     Neurological: Alert and oriented to person, place, and time. Non focal  Skin: Skin is warm and dry.   Data Reviewed: I have personally reviewed labs and imaging studies  Time spent: 35 minutes  Alban Pepper, MD [facility] If 7PM-7AM, please contact night-coverage 04/18/2024, 4:30 PM

## 2024-04-18 NOTE — Progress Notes (Signed)
 Mobility Specialist - Progress Note   04/18/24 1206  Mobility  Activity Ambulated independently;Dangled on edge of bed;Stood at bedside  Level of Assistance Independent  Assistive Device None  Distance Ambulated (ft) 100 ft  Activity Response Tolerated well  Mobility visit 1 Mobility  Mobility Specialist Start Time (ACUTE ONLY) 1147  Mobility Specialist Stop Time (ACUTE ONLY) 1159  Mobility Specialist Time Calculation (min) (ACUTE ONLY) 12 min   Pt sitting [place] upon entry, utilizing RA with alarm off. Pt transfer to the EOB indep, requiring extra time fro standing once getting to the EOB d/t SOB-- Pt expressed baseline SOB with min activity. Pt STS from EOB and amb ~100 ft in the hallway indep. Pt expressed feeling stronger and stable this date, SOB subsiding during amb. Pt endorsed fatigue and SOB upon return EOB. O2 98% HR 87. Pt left semi fowler with needs within reach.  America Silvan Mobility Specialist 04/18/24 12:12 PM

## 2024-04-19 DIAGNOSIS — R45851 Suicidal ideations: Secondary | ICD-10-CM | POA: Diagnosis not present

## 2024-04-19 DIAGNOSIS — I959 Hypotension, unspecified: Secondary | ICD-10-CM | POA: Diagnosis not present

## 2024-04-19 DIAGNOSIS — N2581 Secondary hyperparathyroidism of renal origin: Secondary | ICD-10-CM | POA: Diagnosis not present

## 2024-04-19 DIAGNOSIS — Z8679 Personal history of other diseases of the circulatory system: Secondary | ICD-10-CM | POA: Diagnosis not present

## 2024-04-19 DIAGNOSIS — T8241XA Breakdown (mechanical) of vascular dialysis catheter, initial encounter: Secondary | ICD-10-CM | POA: Diagnosis not present

## 2024-04-19 DIAGNOSIS — F3289 Other specified depressive episodes: Secondary | ICD-10-CM | POA: Diagnosis not present

## 2024-04-19 DIAGNOSIS — D638 Anemia in other chronic diseases classified elsewhere: Secondary | ICD-10-CM | POA: Diagnosis not present

## 2024-04-19 DIAGNOSIS — R531 Weakness: Secondary | ICD-10-CM | POA: Diagnosis not present

## 2024-04-19 DIAGNOSIS — I951 Orthostatic hypotension: Secondary | ICD-10-CM | POA: Diagnosis not present

## 2024-04-19 DIAGNOSIS — T8249XA Other complication of vascular dialysis catheter, initial encounter: Secondary | ICD-10-CM | POA: Diagnosis not present

## 2024-04-19 DIAGNOSIS — Z9889 Other specified postprocedural states: Secondary | ICD-10-CM | POA: Diagnosis not present

## 2024-04-19 DIAGNOSIS — T8249XS Other complication of vascular dialysis catheter, sequela: Secondary | ICD-10-CM | POA: Diagnosis not present

## 2024-04-19 DIAGNOSIS — Z5912 Inadequate housing utilities: Secondary | ICD-10-CM | POA: Diagnosis not present

## 2024-04-19 DIAGNOSIS — Z1152 Encounter for screening for COVID-19: Secondary | ICD-10-CM | POA: Diagnosis not present

## 2024-04-19 DIAGNOSIS — N400 Enlarged prostate without lower urinary tract symptoms: Secondary | ICD-10-CM | POA: Diagnosis present

## 2024-04-19 DIAGNOSIS — Z833 Family history of diabetes mellitus: Secondary | ICD-10-CM | POA: Diagnosis not present

## 2024-04-19 DIAGNOSIS — E669 Obesity, unspecified: Secondary | ICD-10-CM | POA: Diagnosis not present

## 2024-04-19 DIAGNOSIS — Z419 Encounter for procedure for purposes other than remedying health state, unspecified: Secondary | ICD-10-CM | POA: Diagnosis not present

## 2024-04-19 DIAGNOSIS — D631 Anemia in chronic kidney disease: Secondary | ICD-10-CM | POA: Diagnosis not present

## 2024-04-19 DIAGNOSIS — F32A Depression, unspecified: Secondary | ICD-10-CM | POA: Diagnosis not present

## 2024-04-19 DIAGNOSIS — E785 Hyperlipidemia, unspecified: Secondary | ICD-10-CM | POA: Diagnosis present

## 2024-04-19 DIAGNOSIS — E876 Hypokalemia: Secondary | ICD-10-CM | POA: Diagnosis not present

## 2024-04-19 DIAGNOSIS — T829XXA Unspecified complication of cardiac and vascular prosthetic device, implant and graft, initial encounter: Secondary | ICD-10-CM | POA: Diagnosis not present

## 2024-04-19 DIAGNOSIS — Y712 Prosthetic and other implants, materials and accessory cardiovascular devices associated with adverse incidents: Secondary | ICD-10-CM | POA: Diagnosis not present

## 2024-04-19 DIAGNOSIS — E559 Vitamin D deficiency, unspecified: Secondary | ICD-10-CM | POA: Diagnosis not present

## 2024-04-19 DIAGNOSIS — G4733 Obstructive sleep apnea (adult) (pediatric): Secondary | ICD-10-CM | POA: Diagnosis present

## 2024-04-19 DIAGNOSIS — R296 Repeated falls: Secondary | ICD-10-CM | POA: Diagnosis present

## 2024-04-19 DIAGNOSIS — Z87891 Personal history of nicotine dependence: Secondary | ICD-10-CM | POA: Diagnosis not present

## 2024-04-19 DIAGNOSIS — I120 Hypertensive chronic kidney disease with stage 5 chronic kidney disease or end stage renal disease: Secondary | ICD-10-CM | POA: Diagnosis not present

## 2024-04-19 DIAGNOSIS — I480 Paroxysmal atrial fibrillation: Secondary | ICD-10-CM | POA: Diagnosis present

## 2024-04-19 DIAGNOSIS — Z79899 Other long term (current) drug therapy: Secondary | ICD-10-CM | POA: Diagnosis not present

## 2024-04-19 DIAGNOSIS — N186 End stage renal disease: Secondary | ICD-10-CM | POA: Diagnosis not present

## 2024-04-19 DIAGNOSIS — E8721 Acute metabolic acidosis: Secondary | ICD-10-CM | POA: Diagnosis not present

## 2024-04-19 DIAGNOSIS — Z992 Dependence on renal dialysis: Secondary | ICD-10-CM | POA: Diagnosis not present

## 2024-04-19 DIAGNOSIS — Z8249 Family history of ischemic heart disease and other diseases of the circulatory system: Secondary | ICD-10-CM | POA: Diagnosis not present

## 2024-04-19 DIAGNOSIS — E1122 Type 2 diabetes mellitus with diabetic chronic kidney disease: Secondary | ICD-10-CM | POA: Diagnosis not present

## 2024-04-19 LAB — LACTIC ACID, PLASMA
Lactic Acid, Venous: 1.9 mmol/L (ref 0.5–1.9)
Lactic Acid, Venous: 1.5 mmol/L (ref 0.5–1.9)

## 2024-04-19 LAB — BASIC METABOLIC PANEL WITH GFR
Anion gap: 19 — ABNORMAL HIGH (ref 5–15)
BUN: 47 mg/dL — ABNORMAL HIGH (ref 6–20)
CO2: 21 mmol/L — ABNORMAL LOW (ref 22–32)
Calcium: 9 mg/dL (ref 8.9–10.3)
Chloride: 99 mmol/L (ref 98–111)
Creatinine, Ser: 10.83 mg/dL — ABNORMAL HIGH (ref 0.61–1.24)
GFR, Estimated: 5 mL/min — ABNORMAL LOW (ref >60)
Glucose, Bld: 100 mg/dL — ABNORMAL HIGH (ref 70–99)
Potassium: 3.8 mmol/L (ref 3.5–5.1)
Sodium: 139 mmol/L (ref 135–145)

## 2024-04-19 LAB — CBC
HCT: 34.1 % — ABNORMAL LOW (ref 39.0–52.0)
Hemoglobin: 10.8 g/dL — ABNORMAL LOW (ref 13.0–17.0)
MCH: 30.4 pg (ref 26.0–34.0)
MCHC: 31.7 g/dL (ref 30.0–36.0)
MCV: 96.1 fL (ref 80.0–100.0)
Platelets: 245 K/uL (ref 150–400)
RBC: 3.55 MIL/uL — ABNORMAL LOW (ref 4.22–5.81)
RDW: 12.9 % (ref 11.5–15.5)
WBC: 8.9 K/uL (ref 4.0–10.5)
nRBC: 0 % (ref 0.0–0.2)

## 2024-04-19 MED ORDER — MIDODRINE HCL 5 MG PO TABS
10.0000 mg | ORAL_TABLET | Freq: Once | ORAL | Status: AC
  Administered 2024-04-19: 10 mg via ORAL

## 2024-04-19 MED ORDER — ALBUMIN HUMAN 25 % IV SOLN
INTRAVENOUS | Status: AC
Start: 2024-04-19 — End: 2024-04-19
  Filled 2024-04-19: qty 50

## 2024-04-19 MED ORDER — ALBUMIN HUMAN 25 % IV SOLN
25.0000 g | Freq: Once | INTRAVENOUS | Status: AC
  Administered 2024-04-19: 25 g via INTRAVENOUS
  Filled 2024-04-19: qty 100

## 2024-04-19 MED ORDER — CALCITRIOL 0.25 MCG PO CAPS
0.2500 ug | ORAL_CAPSULE | Freq: Every day | ORAL | Status: DC
  Administered 2024-04-20 – 2024-06-19 (×56): 0.25 ug via ORAL
  Filled 2024-04-19 (×56): qty 1

## 2024-04-19 MED ORDER — ALBUMIN HUMAN 25 % IV SOLN
INTRAVENOUS | Status: AC
  Filled 2024-04-19: qty 50

## 2024-04-19 MED ORDER — HEPARIN SODIUM (PORCINE) 1000 UNIT/ML IJ SOLN
4200.0000 [IU] | Freq: Once | INTRAMUSCULAR | Status: AC
  Administered 2024-04-19: 4200 [IU]

## 2024-04-19 MED ORDER — HEPARIN SODIUM (PORCINE) 1000 UNIT/ML IJ SOLN
INTRAMUSCULAR | Status: AC
Start: 2024-04-19 — End: 2024-04-19
  Filled 2024-04-19: qty 5

## 2024-04-19 NOTE — Plan of Care (Signed)
  Problem: Clinical Measurements: Goal: Will remain free from infection Outcome: Progressing   Problem: Nutrition: Goal: Adequate nutrition will be maintained Outcome: Progressing   Problem: Coping: Goal: Level of anxiety will decrease Outcome: Progressing   Problem: Elimination: Goal: Will not experience complications related to urinary retention Outcome: Progressing

## 2024-04-19 NOTE — Progress Notes (Signed)
  PROGRESS NOTE    DARYLL SPISAK  FMW:[id number] DOB: [birth year] DOA: 04/15/2024 PCP: Vicci Bouchard P, DO  147A/147A-AA  LOS: 0 days   Brief hospital course:   Assessment & Plan: Brady Edwards is a 58 y.o. male with medical history significant of recently diagnosed ESRD now on HD, atrial fibrillation, anxiety and depression, morbid obesity who presented with complaints of weakness and falls.    # Weakness and fall --Fatigue and weakness have been progressive, due to recent hospitalization and decline in health.  CT head wnl, no acute finding. Noted to have elevated BUN (unclear of time line regarding rise in BUN). Also noted to have significant orthostatic hypotension --PT/OT   # ESRD on HD TTS Secondary hyperparathyroidism Calcitriol  deficiency  --recently started on dialysis --iHD per nephro   # Hypotension Persistent, symptomatic when going from seated to standing position. No concern for infection. No known neurological conditions that could be contributing.  --Hold home Toprol  due to low BP --Continue midodrine  10 mg TID (new) --TED hose abd binder  --check lactate    #Normocytic anemia  Likely anemia of chronic disease  Stable.  -f/u iron panel  -ESA per nephrology    # Hx of Afib --pt not taking anticoagulation PTA.  Currently in sinus. --hold home Toprol  due to low BP   # Depression and hx of panic disorder # Passive SI --psych consulted --no need for SI precaution, per psych --cont home bupropion  and Effexor    # OSA --CPAP nightly   # Obesity, Class III, BMI 40-49.9 (morbid obesity)    DVT prophylaxis: Heparin  SQ Code Status: Full code  Family Communication:  Level of care: Med-Surg Dispo:   The patient is from: home Anticipated d/c is to: SNF rehab Anticipated d/c date is: whenever SNF accepts    Subjective and Interval History:  Some lightheadedness with standing. No other complaints.   Objective: Vitals:   04/19/24 1227 04/19/24  1229 04/19/24 1237 04/19/24 1549  BP: (!) 88/64 98/64  98/66  Pulse: 90 75  97  Resp: 19 18  16   Temp:  97.8 F (36.6 C)  98 F (36.7 C)  TempSrc:      SpO2: 100% 98%  96%  Weight:   121.5 kg   Height:        Intake/Output Summary (Last 24 hours) at 04/19/2024 1622 Last data filed at 04/19/2024 1300 Gross per 24 hour  Intake 480 ml  Output 1300 ml  Net -820 ml   Filed Weights   04/17/24 2024 04/19/24 0830 04/19/24 1237  Weight: 124 kg 122.7 kg 121.5 kg    Examination:  Physical Exam  Constitutional: In no distress.  Cardiovascular: Normal rate, regular rhythm. No lower extremity edema  Pulmonary: Non labored breathing on room air, no wheezing or rales.   Abdominal: Soft. Non distended and non tender Musculoskeletal: Normal range of motion.     Neurological: Alert and oriented to person, place, and time. Non focal  Skin: Skin is warm and dry.    Data Reviewed: I have personally reviewed labs and imaging studies  Time spent: 35 minutes  Alban Pepper, MD [facility] If 7PM-7AM, please contact night-coverage 04/19/2024, 4:22 PM

## 2024-04-19 NOTE — Progress Notes (Signed)
 Physical Therapy Treatment Patient Details Name: Brady Edwards MRN: [id number] DOB: 02-22-1966 Today's Date: 04/19/2024   History of Present Illness Brady Edwards is a 58 y.o. male with pmh of hypertension, atrial fibrillation on metoprolol  not on anticoagulation, BPH, hyperlipidemia, anxiety, constipation, morbid obesity recently admitted from 8/9-8/15/25 recently initiated on dialysis who presents from home with general weakness, frequent falls since discharge, shortness of breath and passive suicidal ideation.    PT Comments  Patient supine in bed on arrival and agreeable to PT treatment session after encouragement. Completed bed mobility and sit to stand independently. Ambulated 200' with supervision and no AD. No balance deficits noted. Will follow up for 1 more visit, but likely at baseline. Discharge plan updated based on current functional level.     If plan is discharge home, recommend the following: A little help with walking and/or transfers;Help with stairs or ramp for entrance;Direct supervision/assist for medications management;Direct supervision/assist for financial management   Can travel by private vehicle        Equipment Recommendations  None recommended by PT    Recommendations for Other Services       Precautions / Restrictions Precautions Precautions: Fall Recall of Precautions/Restrictions: Intact Restrictions Weight Bearing Restrictions Per Provider Order: No     Mobility  Bed Mobility Overal bed mobility: Independent                  Transfers Overall transfer level: Independent Equipment used: None                    Ambulation/Gait Ambulation/Gait assistance: Supervision Gait Distance (Feet): 200 Feet Assistive device: None Gait Pattern/deviations: Step-through pattern, Decreased stride length Gait velocity: decreased     General Gait Details: supervision for safety. No LOB or unsteadiness noted this session   Stairs              Wheelchair Mobility     Tilt Bed    Modified Rankin (Stroke Patients Only)       Balance Overall balance assessment: Mild deficits observed, not formally tested                                          Communication Communication Communication: No apparent difficulties  Cognition Arousal: Alert Behavior During Therapy: WFL for tasks assessed/performed   PT - Cognitive impairments: No apparent impairments                         Following commands: Intact      Cueing    Exercises      General Comments        Pertinent Vitals/Pain Pain Assessment Pain Assessment: No/denies pain    Home Living                          Prior Function            PT Goals (current goals can now be found in the care plan section) Acute Rehab PT Goals Patient Stated Goal: improve independence, find out his medical problems PT Goal Formulation: With patient Time For Goal Achievement: 04/30/24 Potential to Achieve Goals: Fair Progress towards PT goals: Progressing toward goals    Frequency    Min 1X/week      PT Plan      Co-evaluation  AM-PAC PT 6 Clicks Mobility   Outcome Measure  Help needed turning from your back to your side while in a flat bed without using bedrails?: None Help needed moving from lying on your back to sitting on the side of a flat bed without using bedrails?: None Help needed moving to and from a bed to a chair (including a wheelchair)?: None Help needed standing up from a chair using your arms (e.g., wheelchair or bedside chair)?: None Help needed to walk in hospital room?: A Little Help needed climbing 3-5 steps with a railing? : A Little 6 Click Score: 22    End of Session   Activity Tolerance: Patient tolerated treatment well;Patient limited by fatigue Patient left: in bed;with call bell/phone within reach Nurse Communication: Mobility status PT Visit Diagnosis:  Muscle weakness (generalized) (M62.81);History of falling (Z91.81)     Time: 8589-8580 PT Time Calculation (min) (ACUTE ONLY): 9 min  Charges:    $Therapeutic Activity: 8-22 mins PT General Charges $$ ACUTE PT VISIT: 1 Visit                     Maryanne Finder, PT, DPT Physical Therapist - [facility]  [facility]    Ami Mally A Marlowe Cinquemani 04/19/2024, 4:13 PM

## 2024-04-19 NOTE — Plan of Care (Signed)

## 2024-04-19 NOTE — Progress Notes (Signed)
   04/19/24 1229  Vitals  Temp 97.8 F (36.6 C)  BP 98/64  Pulse Rate 75  Resp 18  Type of Weight Post-Dialysis  Oxygen Therapy  SpO2 98 %  O2 Device Room Air  Patient Activity (if Appropriate) In bed  Pulse Oximetry Type Continuous  Oximetry Probe Site Changed No  Post Treatment  Dialyzer Clearance Lightly streaked  Liters Processed 84.6  Fluid Removed (mL) 1300 mL  Tolerated HD Treatment No (Comment) (blood pressure decreased duriing HD. Returned to baseline with decreased goal.. Post blood pressure is 98/64)  Post-Hemodialysis Comments Patient has complained of cramping post treatment. Cramping is slightly easing

## 2024-04-19 NOTE — Progress Notes (Signed)
 [facility]  ROUNDING NOTE   Subjective:   Brady Edwards is a 58 y.o. male with a PMHx of hypertension, atrial fibrillation, BPH, hyperlipidemia, anxiety, constipation, morbid obesity and recently diagnosed end stage renal disease on hemodialysis. He was recently discharged but has returned due to weakness and falls. He has been admitted for Suicidal ideation [R45.851] Anasarca [R60.1] Weakness [R53.1] General weakness [R53.1] Frequent falls [R29.6] Renal failure, unspecified chronicity [N19]  Patient is known to our practice and receives outpatient dialysis treatments at [facility] on a TTS schedule.  SABRA Update: Patient seen and evaluated during dialysis   HEMODIALYSIS FLOWSHEET:  Blood Flow Rate (mL/min): 300 mL/min Arterial Pressure (mmHg): -152.72 mmHg Venous Pressure (mmHg): 153.53 mmHg TMP (mmHg): 18.79 mmHg Ultrafiltration Rate (mL/min): 829 mL/min Dialysate Flow Rate (mL/min): 299 ml/min Dialysis Fluid Bolus: Albumin   No complaints to offer   Objective:  Vital signs in last 24 hours:  Temp:  [98.1 F (36.7 C)-98.5 F (36.9 C)] 98.2 F (36.8 C) (08/21 0752) Pulse Rate:  [57-104] 90 (08/21 0914) Resp:  [17-20] 20 (08/21 0845) BP: (80-102)/(55-66) 95/58 (08/21 0914) SpO2:  [91 %-100 %] 93 % (08/21 0914) Weight:  [122.7 kg] 122.7 kg (08/21 0830)  Weight change:  Filed Weights   04/17/24 1636 04/17/24 2024 04/19/24 0830  Weight: 125.5 kg 124 kg 122.7 kg    Intake/Output: I/O last 3 completed shifts: In: 460 [P.O.:460] Out: 1500 [Other:1500]   Intake/Output this shift:  No intake/output data recorded.  Physical Exam: General: NAD  Head: Normocephalic, atraumatic. Moist oral mucosal membranes  Eyes: Anicteric  Lungs:  Clear to auscultation, normal effort  Heart: Regular rate and rhythm  Abdomen:  Soft, nontender  Extremities:  No peripheral edema.  Neurologic: Awake, alert, conversant  Skin: Warm,dry, no rash  Access: Rt  Permcath     Basic Metabolic Panel: Recent Labs  Lab 04/13/24 0800 04/15/24 1529 04/17/24 1722  NA 142 140 137  K 3.8 4.1 4.0  CL 109 107 103  CO2 21* 16* 19*  GLUCOSE 87 158* 96  BUN 54* 54* 71*  CREATININE 8.19* 10.53* 14.37*  CALCIUM  8.2* 8.7* 9.0  MG  --  2.2  --   PHOS 6.1*  --  7.5*    Liver Function Tests: Recent Labs  Lab 04/13/24 0800 04/15/24 1529 04/17/24 1722  AST  --  27  --   ALT  --  26  --   ALKPHOS  --  80  --   BILITOT  --  0.9  --   PROT  --  6.4*  --   ALBUMIN  3.0* 3.1* 3.3*   No results for input(s): LIPASE, AMYLASE in the last 168 hours.  Recent Labs  Lab 04/15/24 1606  AMMONIA 20    CBC: Recent Labs  Lab 04/13/24 0800 04/15/24 1529 04/17/24 1720 04/19/24 0746  WBC 8.5 9.9 9.6 8.9  NEUTROABS  --  6.8  --   --   HGB 10.1* 10.6* 10.2* 10.8*  HCT 31.7* 31.9* 31.0* 34.1*  MCV 95.8 93.0 94.5 96.1  PLT 192 223 227 245    Cardiac Enzymes: No results for input(s): CKTOTAL, CKMB, CKMBINDEX, TROPONINI in the last 168 hours.   BNP: Invalid input(s): POCBNP  CBG: No results for input(s): GLUCAP in the last 168 hours.  Microbiology: Results for orders placed or performed during the hospital encounter of 04/07/24  Resp panel by RT-PCR (RSV, Flu A&B, Covid) Anterior Nasal Swab  Status: None   Collection Time: 04/07/24  4:46 PM   Specimen: Anterior Nasal Swab  Result Value Ref Range Status   SARS Coronavirus 2 by RT PCR NEGATIVE NEGATIVE Final    Comment: (NOTE) SARS-CoV-2 target nucleic acids are NOT DETECTED.  The SARS-CoV-2 RNA is generally detectable in upper respiratory specimens during the acute phase of infection. The lowest concentration of SARS-CoV-2 viral copies this assay can detect is 138 copies/mL. A negative result does not preclude SARS-Cov-2 infection and should not be used as the sole basis for treatment or other patient management decisions. A negative result may occur with  improper specimen  collection/handling, submission of specimen other than nasopharyngeal swab, presence of viral mutation(s) within the areas targeted by this assay, and inadequate number of viral copies(<138 copies/mL). A negative result must be combined with clinical observations, patient history, and epidemiological information. The expected result is Negative.  Fact Sheet for Patients:  BloggerCourse.com  Fact Sheet for Healthcare Providers:  SeriousBroker.it  This test is no t yet approved or cleared by the [place]  FDA and  has been authorized for detection and/or diagnosis of SARS-CoV-2 by FDA under an Emergency Use Authorization (EUA). This EUA will remain  in effect (meaning this test can be used) for the duration of the COVID-19 declaration under Section 564(b)(1) of the Act, 21 U.S.C.section 360bbb-3(b)(1), unless the authorization is terminated  or revoked sooner.       Influenza A by PCR NEGATIVE NEGATIVE Final   Influenza B by PCR NEGATIVE NEGATIVE Final    Comment: (NOTE) The Xpert Xpress SARS-CoV-2/FLU/RSV plus assay is intended as an aid in the diagnosis of influenza from Nasopharyngeal swab specimens and should not be used as a sole basis for treatment. Nasal washings and aspirates are unacceptable for Xpert Xpress SARS-CoV-2/FLU/RSV testing.  Fact Sheet for Patients: BloggerCourse.com  Fact Sheet for Healthcare Providers: SeriousBroker.it  This test is not yet approved or cleared by the [place]  FDA and has been authorized for detection and/or diagnosis of SARS-CoV-2 by FDA under an Emergency Use Authorization (EUA). This EUA will remain in effect (meaning this test can be used) for the duration of the COVID-19 declaration under Section 564(b)(1) of the Act, 21 U.S.C. section 360bbb-3(b)(1), unless the authorization is terminated or revoked.     Resp Syncytial  Virus by PCR NEGATIVE NEGATIVE Final    Comment: (NOTE) Fact Sheet for Patients: BloggerCourse.com  Fact Sheet for Healthcare Providers: SeriousBroker.it  This test is not yet approved or cleared by the [place]  FDA and has been authorized for detection and/or diagnosis of SARS-CoV-2 by FDA under an Emergency Use Authorization (EUA). This EUA will remain in effect (meaning this test can be used) for the duration of the COVID-19 declaration under Section 564(b)(1) of the Act, 21 U.S.C. section 360bbb-3(b)(1), unless the authorization is terminated or revoked.  Performed at [facility], [address] [place], [place] [address]     Coagulation Studies: No results for input(s): LABPROT, INR in the last 72 hours.   Urinalysis: No results for input(s): COLORURINE, LABSPEC, PHURINE, GLUCOSEU, HGBUR, BILIRUBINUR, KETONESUR, PROTEINUR, UROBILINOGEN, NITRITE, LEUKOCYTESUR in the last 72 hours.  Invalid input(s): APPERANCEUR    Imaging: No results found.    Medications:    albumin  human       buPROPion   300 mg Oral Daily   calcium  carbonate  1 tablet Oral TID WC   Chlorhexidine  Gluconate Cloth  6 each Topical Daily   heparin   5,000  Units Subcutaneous Q8H   midodrine   10 mg Oral TID WC   venlafaxine  XR  150 mg Oral Q breakfast   And   venlafaxine  XR  75 mg Oral Q breakfast   acetaminophen , ondansetron  (ZOFRAN ) IV, ondansetron   Assessment/ Plan:  [name] is a 58 y.o.  male with a PMHx of hypertension, atrial fibrillation, BPH, hyperlipidemia, anxiety, constipation, morbid obesity, and end stage renal disease on hemodialysis.   [facility] [place]/TTS/Rt permcath  End stage renal disease on hemodialysis.Receiving dialysis today, UF 2L as tolerated. Will order Albumin  25g during dialysis for blood pressure to optimize fluid removal. Next treatment scheduled for  Saturday.   Lab Results  Component Value Date   CREATININE 14.37 (H) 04/17/2024   CREATININE 10.53 (H) 04/15/2024   CREATININE 8.19 (H) 04/13/2024    Intake/Output Summary (Last 24 hours) at 04/19/2024 0920 Last data filed at 04/18/2024 1900 Gross per 24 hour  Intake 360 ml  Output --  Net 360 ml    2.  Acute metabolic acidosis.  Serum bicarbonate 16 on admission. Correcting with dialysis   3.  Anemia of chronic kidney disease.  Hemoglobin 10.8. Acceptable  4.  Secondary hyperparathyroidism. PTH 199 during recent admission. Calcium  stable. Phos is increased. Continue calcium  carbonate.      LOS: 0 Anamika Kueker 8/21/20259:20 AM

## 2024-04-19 NOTE — Progress Notes (Signed)
 MEWS Progress Note  Patient Details Name: Brady Edwards MRN: [id number] DOB: [birth year] Today's Date: 04/19/2024   MEWS Flowsheet Documentation:  Assess: MEWS Score Temp: 98.5 F (36.9 C) BP: (!) 80/57 MAP (mmHg): 65 Pulse Rate: 77 ECG Heart Rate: (!) 111 Resp: 17 Level of Consciousness: Alert SpO2: 96 % O2 Device: Room Air Patient Activity (if Appropriate): In bed Assess: MEWS Score MEWS Temp: 0 MEWS Systolic: 2 MEWS Pulse: 0 MEWS RR: 0 MEWS LOC: 0 MEWS Score: 2 MEWS Score Color: Yellow Assess: SIRS CRITERIA SIRS Temperature : 0 SIRS Respirations : 0 SIRS Pulse: 0 SIRS WBC: 0 SIRS Score Sum : 0 SIRS Temperature : 0 SIRS Pulse: 0 SIRS Respirations : 0 SIRS WBC: 0 SIRS Score Sum : 0 Provider Notification Provider Name/Title: Dr. Madison Peaches Date Provider Notified: 04/19/24 Time Provider Notified: 0505 Method of Notification: Page (Secure Chat) Notification Reason: Change in status Provider response: See new orders Date of Provider Response: 04/19/24 Time of Provider Response: 0510      Brady Edwards I Brady Edwards 04/19/2024, 5:16 AM

## 2024-04-20 DIAGNOSIS — N2581 Secondary hyperparathyroidism of renal origin: Secondary | ICD-10-CM | POA: Diagnosis not present

## 2024-04-20 DIAGNOSIS — I951 Orthostatic hypotension: Secondary | ICD-10-CM | POA: Diagnosis not present

## 2024-04-20 DIAGNOSIS — E8721 Acute metabolic acidosis: Secondary | ICD-10-CM | POA: Diagnosis not present

## 2024-04-20 DIAGNOSIS — D631 Anemia in chronic kidney disease: Secondary | ICD-10-CM | POA: Diagnosis not present

## 2024-04-20 DIAGNOSIS — R531 Weakness: Secondary | ICD-10-CM | POA: Diagnosis not present

## 2024-04-20 DIAGNOSIS — N186 End stage renal disease: Secondary | ICD-10-CM | POA: Diagnosis not present

## 2024-04-20 LAB — CBC
HCT: 36.1 % — ABNORMAL LOW (ref 39.0–52.0)
Hemoglobin: 11.8 g/dL — ABNORMAL LOW (ref 13.0–17.0)
MCH: 30.3 pg (ref 26.0–34.0)
MCHC: 32.7 g/dL (ref 30.0–36.0)
MCV: 92.8 fL (ref 80.0–100.0)
Platelets: 232 K/uL (ref 150–400)
RBC: 3.89 MIL/uL — ABNORMAL LOW (ref 4.22–5.81)
RDW: 12.8 % (ref 11.5–15.5)
WBC: 9.8 K/uL (ref 4.0–10.5)
nRBC: 0 % (ref 0.0–0.2)

## 2024-04-20 LAB — IRON AND TIBC
Iron: 78 ug/dL (ref 45–182)
Saturation Ratios: 26 % (ref 17.9–39.5)
TIBC: 304 ug/dL (ref 250–450)
UIBC: 226 ug/dL

## 2024-04-20 MED ORDER — FLUDROCORTISONE ACETATE 0.1 MG PO TABS
0.1000 mg | ORAL_TABLET | Freq: Every day | ORAL | Status: DC
  Administered 2024-04-20 – 2024-04-24 (×4): 0.1 mg via ORAL
  Filled 2024-04-20 (×5): qty 1

## 2024-04-20 MED ORDER — NEPRO/CARBSTEADY PO LIQD: 237.0000 mL | Freq: Two times a day (BID) | ORAL | Status: DC

## 2024-04-20 MED ORDER — ENSURE PLUS HIGH PROTEIN PO LIQD
237.0000 mL | Freq: Three times a day (TID) | ORAL | Status: DC
  Administered 2024-04-20 – 2024-04-24 (×2): 237 mL via ORAL

## 2024-04-20 NOTE — Plan of Care (Signed)
   Problem: Education: Goal: Knowledge of General Education information will improve Description Including pain rating scale, medication(s)/side effects and non-pharmacologic comfort measures Outcome: Progressing   Problem: Health Behavior/Discharge Planning: Goal: Ability to manage health-related needs will improve Outcome: Progressing

## 2024-04-20 NOTE — Progress Notes (Signed)
 PROGRESS NOTE    Brady Edwards  FMW:[id number] DOB: [birth year] DOA: 04/15/2024 PCP: Brady Duwaine SQUIBB, DO  [id number]-AA  LOS: 1 day   Brief hospital course:   Brady Edwards is a 58 y.o. male with medical history significant of newly ESRD recently started on HD, atrial fibrillation, anxiety and depression, morbid obesity who presented with complaints of weakness and falls.   Pt was very frustrated with his medical and mental problems, and wanted us  to make him better.  Pt complained of fatigue, lack of energy, and weakness, causing to fall several times since he was discharged from hospital on 04/13/24.  No focal neuro deficits.  Pt said he has had long standing mental health issues and needs to see psych.  Pt complained of constipation for the past 2 months that wasn't addressed during his last hospitalization.     Of note, pt was hospitalized from 04/07/24 to 04/13/24 during which he was started on dialysis, and was discharged after dialysis on 8/15 to continue outpatient dialysis TTS.   ED Course: initial vitals: afebrile, pulse 105, BP 86/65, RR 12, sating 99% on room air.  Labs consistent with ESRD.  CXR no acute finding.  EKG showed sinus tachycardia.  CT head no acute finding.  ED provider reported pt said he would be better off not living at this point but does not have a plan for suicide and is going to receive help.  Psych consult placed.  ED provider contacted oncall nephro, who plans dialysis for pt tomorrow.  Assessment & Plan:  Brady Edwards is a 58 y.o. male with medical history significant of recently diagnosed ESRD now on HD, atrial fibrillation, anxiety and depression, morbid obesity who presented with complaints of weakness and falls.    # Weakness and fall --Fatigue and weakness have been progressive, due to recent hospitalization and decline in health.  CT head wnl, no acute finding. Noted to have elevated BUN (unclear of time line regarding rise in BUN). Also noted  to have significant orthostatic hypotension --PT/OT   # ESRD on HD TTS Secondary hyperparathyroidism Calcitriol  deficiency  --recently started on dialysis --iHD per nephro   # Orthostatic Hypotension Persistent, symptomatic when going from seated to standing position. No concern for infection. No known neurological conditions that could be contributing.  --Hold home Toprol  due to low BP --Continue midodrine  10 mg TID --TED hose abd binder  --Will add florinef  0.1mg  every day discussed this with nephrology team   #Normocytic anemia, mild  Likely anemia of chronic disease  Stable.  -ESA per nephrology   # Hx of Afib --pt not taking anticoagulation PTA.  Currently in sinus. --hold home Toprol  due to low BP   # Depression and hx of panic disorder # Passive SI --psych consulted --no need for SI precaution, per psych --cont home bupropion  and Effexor    # OSA --CPAP nightly   # Obesity, Class III, BMI 40-49.9 (morbid obesity)    DVT prophylaxis: Heparin  SQ Code Status: Full code  Family Communication:  Level of care: Med-Surg Dispo:   The patient is from: home Anticipated d/c is to: SNF rehab Anticipated d/c date is: whenever SNF accepts    Subjective and Interval History:  Continues to have some lightheadedness. Poor appetite. He would like more chocolate ensures.   Objective: Vitals:   04/19/24 1935 04/20/24 0331 04/20/24 0804 04/20/24 1532  BP: (!) 108/59 (!) 94/59 92/70 (!) 82/64  Pulse: 80 98 89 93  Resp: 19  19 17 16   Temp: 98.3 F (36.8 C) 97.8 F (36.6 C) 98.4 F (36.9 C) 98.4 F (36.9 C)  TempSrc: Oral  Oral Oral  SpO2: 98% (!) 89% 95% 96%  Weight:      Height:        Intake/Output Summary (Last 24 hours) at 04/20/2024 1829 Last data filed at 04/20/2024 1300 Gross per 24 hour  Intake 0 ml  Output --  Net 0 ml   Filed Weights   04/17/24 2024 04/19/24 0830 04/19/24 1237  Weight: 124 kg 122.7 kg 121.5 kg    Examination:  Physical Exam   Constitutional: In no distress.  Poor dentition Cardiovascular: Normal rate, regular rhythm. No lower extremity edema  Pulmonary: Non labored breathing on room air, no wheezing or rales.   Abdominal: Soft. Normal bowel sounds. Non distended and non tender Musculoskeletal: Normal range of motion.     Neurological: Alert and oriented to person, place, and time. Non focal  Skin: Skin is warm and dry.     Data Reviewed: I have personally reviewed labs and imaging studies  Time spent: 35 minutes  Alban Pepper, MD [facility] If 7PM-7AM, please contact night-coverage 04/20/2024, 6:29 PM

## 2024-04-20 NOTE — Progress Notes (Signed)
 [facility]  ROUNDING NOTE   Subjective:   Brady Edwards is a 58 y.o. male with a PMHx of hypertension, atrial fibrillation, BPH, hyperlipidemia, anxiety, constipation, morbid obesity and recently diagnosed end stage renal disease on hemodialysis. He was recently discharged but has returned due to weakness and falls. He has been admitted for Suicidal ideation [R45.851] Anasarca [R60.1] Weakness [R53.1] General weakness [R53.1] Frequent falls [R29.6] Renal failure, unspecified chronicity [N19] Hypotension [I95.9]  Patient is known to our practice and receives outpatient dialysis treatments at [facility] on a TTS schedule.   Update:  Patient seen resting in bed Denies pain Does report weakness Room air   Objective:  Vital signs in last 24 hours:  Temp:  [97.8 F (36.6 C)-98.4 F (36.9 C)] 98.4 F (36.9 C) (08/22 0804) Pulse Rate:  [80-98] 89 (08/22 0804) Resp:  [16-19] 17 (08/22 0804) BP: (92-108)/(59-70) 92/70 (08/22 0804) SpO2:  [89 %-98 %] 95 % (08/22 0804)  Weight change:  Filed Weights   04/17/24 2024 04/19/24 0830 04/19/24 1237  Weight: 124 kg 122.7 kg 121.5 kg    Intake/Output: I/O last 3 completed shifts: In: 240 [P.O.:240] Out: 1300 [Other:1300]   Intake/Output this shift:  No intake/output data recorded.  Physical Exam: General: NAD  Head: Normocephalic, atraumatic. Moist oral mucosal membranes  Eyes: Anicteric  Lungs:  Clear to auscultation, normal effort  Heart: Regular rate and rhythm  Abdomen:  Soft, nontender  Extremities:  No peripheral edema.  Neurologic: Awake, alert, conversant  Skin: Warm,dry, no rash  Access: Rt Permcath     Basic Metabolic Panel: Recent Labs  Lab 04/15/24 1529 04/17/24 1722 04/19/24 0746  NA 140 137 139  K 4.1 4.0 3.8  CL 107 103 99  CO2 16* 19* 21*  GLUCOSE 158* 96 100*  BUN 54* 71* 47*  CREATININE 10.53* 14.37* 10.83*  CALCIUM  8.7* 9.0 9.0  MG 2.2  --   --   PHOS  --  7.5*  --      Liver Function Tests: Recent Labs  Lab 04/15/24 1529 04/17/24 1722  AST 27  --   ALT 26  --   ALKPHOS 80  --   BILITOT 0.9  --   PROT 6.4*  --   ALBUMIN  3.1* 3.3*   No results for input(s): LIPASE, AMYLASE in the last 168 hours.  Recent Labs  Lab 04/15/24 1606  AMMONIA 20    CBC: Recent Labs  Lab 04/15/24 1529 04/17/24 1720 04/19/24 0746 04/20/24 0513  WBC 9.9 9.6 8.9 9.8  NEUTROABS 6.8  --   --   --   HGB 10.6* 10.2* 10.8* 11.8*  HCT 31.9* 31.0* 34.1* 36.1*  MCV 93.0 94.5 96.1 92.8  PLT 223 227 245 232    Cardiac Enzymes: No results for input(s): CKTOTAL, CKMB, CKMBINDEX, TROPONINI in the last 168 hours.   BNP: Invalid input(s): POCBNP  CBG: No results for input(s): GLUCAP in the last 168 hours.  Microbiology: Results for orders placed or performed during the hospital encounter of 04/07/24  Resp panel by RT-PCR (RSV, Flu A&B, Covid) Anterior Nasal Swab     Status: None   Collection Time: 04/07/24  4:46 PM   Specimen: Anterior Nasal Swab  Result Value Ref Range Status   SARS Coronavirus 2 by RT PCR NEGATIVE NEGATIVE Final    Comment: (NOTE) SARS-CoV-2 target nucleic acids are NOT DETECTED.  The SARS-CoV-2 RNA is generally detectable in upper respiratory specimens during the acute phase  of infection. The lowest concentration of SARS-CoV-2 viral copies this assay can detect is 138 copies/mL. A negative result does not preclude SARS-Cov-2 infection and should not be used as the sole basis for treatment or other patient management decisions. A negative result may occur with  improper specimen collection/handling, submission of specimen other than nasopharyngeal swab, presence of viral mutation(s) within the areas targeted by this assay, and inadequate number of viral copies(<138 copies/mL). A negative result must be combined with clinical observations, patient history, and epidemiological information. The expected result is  Negative.  Fact Sheet for Patients:  BloggerCourse.com  Fact Sheet for Healthcare Providers:  SeriousBroker.it  This test is no t yet approved or cleared by the [place]  FDA and  has been authorized for detection and/or diagnosis of SARS-CoV-2 by FDA under an Emergency Use Authorization (EUA). This EUA will remain  in effect (meaning this test can be used) for the duration of the COVID-19 declaration under Section 564(b)(1) of the Act, 21 U.S.C.section 360bbb-3(b)(1), unless the authorization is terminated  or revoked sooner.       Influenza A by PCR NEGATIVE NEGATIVE Final   Influenza B by PCR NEGATIVE NEGATIVE Final    Comment: (NOTE) The Xpert Xpress SARS-CoV-2/FLU/RSV plus assay is intended as an aid in the diagnosis of influenza from Nasopharyngeal swab specimens and should not be used as a sole basis for treatment. Nasal washings and aspirates are unacceptable for Xpert Xpress SARS-CoV-2/FLU/RSV testing.  Fact Sheet for Patients: BloggerCourse.com  Fact Sheet for Healthcare Providers: SeriousBroker.it  This test is not yet approved or cleared by the [place]  FDA and has been authorized for detection and/or diagnosis of SARS-CoV-2 by FDA under an Emergency Use Authorization (EUA). This EUA will remain in effect (meaning this test can be used) for the duration of the COVID-19 declaration under Section 564(b)(1) of the Act, 21 U.S.C. section 360bbb-3(b)(1), unless the authorization is terminated or revoked.     Resp Syncytial Virus by PCR NEGATIVE NEGATIVE Final    Comment: (NOTE) Fact Sheet for Patients: BloggerCourse.com  Fact Sheet for Healthcare Providers: SeriousBroker.it  This test is not yet approved or cleared by the [place]  FDA and has been authorized for detection and/or diagnosis of  SARS-CoV-2 by FDA under an Emergency Use Authorization (EUA). This EUA will remain in effect (meaning this test can be used) for the duration of the COVID-19 declaration under Section 564(b)(1) of the Act, 21 U.S.C. section 360bbb-3(b)(1), unless the authorization is terminated or revoked.  Performed at [facility], [address] [place], [place] [address]     Coagulation Studies: No results for input(s): LABPROT, INR in the last 72 hours.   Urinalysis: No results for input(s): COLORURINE, LABSPEC, PHURINE, GLUCOSEU, HGBUR, BILIRUBINUR, KETONESUR, PROTEINUR, UROBILINOGEN, NITRITE, LEUKOCYTESUR in the last 72 hours.  Invalid input(s): APPERANCEUR    Imaging: No results found.    Medications:       buPROPion   300 mg Oral Daily   calcitRIOL   0.25 mcg Oral Daily   calcium  carbonate  1 tablet Oral TID WC   Chlorhexidine  Gluconate Cloth  6 each Topical Daily   feeding supplement (NEPRO CARB STEADY)  237 mL Oral BID BM   heparin   5,000 Units Subcutaneous Q8H   midodrine   10 mg Oral TID WC   venlafaxine  XR  150 mg Oral Q breakfast   And   venlafaxine  XR  75 mg Oral Q breakfast   acetaminophen , ondansetron  (ZOFRAN ) IV, ondansetron   Assessment/  Plan:  Brady Edwards is a 58 y.o.  male with a PMHx of hypertension, atrial fibrillation, BPH, hyperlipidemia, anxiety, constipation, morbid obesity, and end stage renal disease on hemodialysis.   [facility] [place]/TTS/Rt permcath  End stage renal disease on hemodialysis Dialysis received yesterday, UF 1.3L achieved. Next treatment scheduled for Saturday with low to no UF.   Lab Results  Component Value Date   CREATININE 10.83 (H) 04/19/2024   CREATININE 14.37 (H) 04/17/2024   CREATININE 10.53 (H) 04/15/2024    Intake/Output Summary (Last 24 hours) at 04/20/2024 1238 Last data filed at 04/19/2024 1300 Gross per 24 hour  Intake 240 ml  Output --  Net 240 ml    2.  Acute  metabolic acidosis.  Serum bicarbonate 16 on admission. Correcting with dialysis, 21 today   3.  Anemia of chronic kidney disease.  Hemoglobin 11.8. No need for ESA during this admission  4.  Secondary hyperparathyroidism. PTH 199 during recent admission. Continue calcium  carbonate.      LOS: 1 Rogina Schiano 8/22/202512:38 PM

## 2024-04-21 DIAGNOSIS — I951 Orthostatic hypotension: Secondary | ICD-10-CM | POA: Diagnosis not present

## 2024-04-21 DIAGNOSIS — R531 Weakness: Secondary | ICD-10-CM | POA: Diagnosis not present

## 2024-04-21 LAB — CBC
HCT: 37.6 % — ABNORMAL LOW (ref 39.0–52.0)
Hemoglobin: 12.4 g/dL — ABNORMAL LOW (ref 13.0–17.0)
MCH: 30.5 pg (ref 26.0–34.0)
MCHC: 33 g/dL (ref 30.0–36.0)
MCV: 92.6 fL (ref 80.0–100.0)
Platelets: 261 K/uL (ref 150–400)
RBC: 4.06 MIL/uL — ABNORMAL LOW (ref 4.22–5.81)
RDW: 12.7 % (ref 11.5–15.5)
WBC: 11.6 K/uL — ABNORMAL HIGH (ref 4.0–10.5)
nRBC: 0 % (ref 0.0–0.2)

## 2024-04-21 LAB — FERRITIN: Ferritin: 343 ng/mL — ABNORMAL HIGH (ref 24–336)

## 2024-04-21 LAB — RENAL FUNCTION PANEL
Albumin: 3.9 g/dL (ref 3.5–5.0)
Anion gap: 19 — ABNORMAL HIGH (ref 5–15)
BUN: 43 mg/dL — ABNORMAL HIGH (ref 6–20)
CO2: 21 mmol/L — ABNORMAL LOW (ref 22–32)
Calcium: 9 mg/dL (ref 8.9–10.3)
Chloride: 96 mmol/L — ABNORMAL LOW (ref 98–111)
Creatinine, Ser: 11.36 mg/dL — ABNORMAL HIGH (ref 0.61–1.24)
GFR, Estimated: 5 mL/min — ABNORMAL LOW (ref >60)
Glucose, Bld: 98 mg/dL (ref 70–99)
Phosphorus: 7.6 mg/dL — ABNORMAL HIGH (ref 2.5–4.6)
Potassium: 3.5 mmol/L (ref 3.5–5.1)
Sodium: 136 mmol/L (ref 135–145)

## 2024-04-21 MED ORDER — ALBUMIN HUMAN 25 % IV SOLN
INTRAVENOUS | Status: AC
Start: 2024-04-21 — End: 2024-04-21
  Filled 2024-04-21: qty 50

## 2024-04-21 MED ORDER — MIDODRINE HCL 5 MG PO TABS
ORAL_TABLET | ORAL | Status: AC
Start: 2024-04-21 — End: 2024-04-21
  Filled 2024-04-21: qty 2

## 2024-04-21 MED ORDER — ALBUMIN HUMAN 25 % IV SOLN
12.5000 g | Freq: Once | INTRAVENOUS | Status: AC
  Administered 2024-04-21: 12.5 g via INTRAVENOUS

## 2024-04-21 MED ORDER — HEPARIN SODIUM (PORCINE) 1000 UNIT/ML IJ SOLN
INTRAMUSCULAR | Status: AC
  Filled 2024-04-21: qty 5

## 2024-04-21 MED ORDER — SEVELAMER CARBONATE 800 MG PO TABS
1600.0000 mg | ORAL_TABLET | Freq: Three times a day (TID) | ORAL | Status: DC
  Administered 2024-04-21 – 2024-06-18 (×141): 1600 mg via ORAL
  Filled 2024-04-21 (×178): qty 2

## 2024-04-21 NOTE — Progress Notes (Signed)
   04/21/24 1128  Vitals  Temp 97.8 F (36.6 C)  BP 92/68  Pulse Rate 78  Resp 18  Weight 119 kg  Type of Weight Post-Dialysis  Oxygen Therapy  SpO2 96 %  O2 Device Room Air  Patient Activity (if Appropriate) In bed  Pulse Oximetry Type Continuous  Oximetry Probe Site Changed No  Post Treatment  Dialyzer Clearance Heavily streaked  Liters Processed 73.5  Fluid Removed (mL) 200 mL  Tolerated HD Treatment Yes  Post-Hemodialysis Comments Vs are stable. No verbalized concenrs. Catheter dsg has been changed. Patient is stable for discharge from HD

## 2024-04-21 NOTE — Progress Notes (Signed)
 [facility]  ROUNDING NOTE   Subjective:  Patient seen and evaluated while on dialysis. Reports food insecurity at home.  Hemodialysis dialysis treatment flowsheet  Blood flow rate (mL/min):349 Arterial pressures (mmHg):-175.95 Venous pressures (mmHg): 213.52 TMP (mmHg): 10.5 Ultrafiltration rate (mL/min): 271 Dialysate (mL/min): 299  Objective:  Vital signs in last 24 hours:  Temp:  [97.7 F (36.5 C)-98.4 F (36.9 C)] 97.7 F (36.5 C) (08/23 0720) Pulse Rate:  [77-100] 97 (08/23 1030) Resp:  [12-21] 21 (08/23 1030) BP: (82-103)/(51-78) 86/52 (08/23 1030) SpO2:  [96 %-99 %] 98 % (08/23 1000) Weight:  [120 kg] 120 kg (08/23 0720)  Weight change:  Filed Weights   04/19/24 0830 04/19/24 1237 04/21/24 0720  Weight: 122.7 kg 121.5 kg 120 kg    Intake/Output: No intake/output data recorded.   Intake/Output this shift:  No intake/output data recorded.  Physical Exam: General: NAD  Head: Normocephalic  Eyes: Anicteric  Neck: Supple  Lungs:  Diminished to auscultation  Heart: Regular rate and rhythm  Abdomen:  Soft, distended  Extremities:  trace peripheral edema.  Neurologic: alert  Skin: No lesions  Access: Rt permcath    Basic Metabolic Panel: Recent Labs  Lab 04/15/24 1529 04/17/24 1722 04/19/24 0746 04/21/24 0745  NA 140 137 139 136  K 4.1 4.0 3.8 3.5  CL 107 103 99 96*  CO2 16* 19* 21* 21*  GLUCOSE 158* 96 100* 98  BUN 54* 71* 47* 43*  CREATININE 10.53* 14.37* 10.83* 11.36*  CALCIUM  8.7* 9.0 9.0 9.0  MG 2.2  --   --   --   PHOS  --  7.5*  --  7.6*    Liver Function Tests: Recent Labs  Lab 04/15/24 1529 04/17/24 1722 04/21/24 0745  AST 27  --   --   ALT 26  --   --   ALKPHOS 80  --   --   BILITOT 0.9  --   --   PROT 6.4*  --   --   ALBUMIN  3.1* 3.3* 3.9   No results for input(s): LIPASE, AMYLASE in the last 168 hours. Recent Labs  Lab 04/15/24 1606  AMMONIA 20    CBC: Recent Labs  Lab 04/15/24 1529  04/17/24 1720 04/19/24 0746 04/20/24 0513 04/21/24 0414  WBC 9.9 9.6 8.9 9.8 11.6*  NEUTROABS 6.8  --   --   --   --   HGB 10.6* 10.2* 10.8* 11.8* 12.4*  HCT 31.9* 31.0* 34.1* 36.1* 37.6*  MCV 93.0 94.5 96.1 92.8 92.6  PLT 223 227 245 232 261    Cardiac Enzymes: No results for input(s): CKTOTAL, CKMB, CKMBINDEX, TROPONINI in the last 168 hours.  BNP: Invalid input(s): POCBNP  CBG: No results for input(s): GLUCAP in the last 168 hours.  Microbiology: Results for orders placed or performed during the hospital encounter of 04/07/24  Resp panel by RT-PCR (RSV, Flu A&B, Covid) Anterior Nasal Swab     Status: None   Collection Time: 04/07/24  4:46 PM   Specimen: Anterior Nasal Swab  Result Value Ref Range Status   SARS Coronavirus 2 by RT PCR NEGATIVE NEGATIVE Final    Comment: (NOTE) SARS-CoV-2 target nucleic acids are NOT DETECTED.  The SARS-CoV-2 RNA is generally detectable in upper respiratory specimens during the acute phase of infection. The lowest concentration of SARS-CoV-2 viral copies this assay can detect is 138 copies/mL. A negative result does not preclude SARS-Cov-2 infection and should not be used as the sole basis  for treatment or other patient management decisions. A negative result may occur with  improper specimen collection/handling, submission of specimen other than nasopharyngeal swab, presence of viral mutation(s) within the areas targeted by this assay, and inadequate number of viral copies(<138 copies/mL). A negative result must be combined with clinical observations, patient history, and epidemiological information. The expected result is Negative.  Fact Sheet for Patients:  BloggerCourse.com  Fact Sheet for Healthcare Providers:  SeriousBroker.it  This test is no t yet approved or cleared by the [place]  FDA and  has been authorized for detection and/or diagnosis of SARS-CoV-2  by FDA under an Emergency Use Authorization (EUA). This EUA will remain  in effect (meaning this test can be used) for the duration of the COVID-19 declaration under Section 564(b)(1) of the Act, 21 U.S.C.section 360bbb-3(b)(1), unless the authorization is terminated  or revoked sooner.       Influenza A by PCR NEGATIVE NEGATIVE Final   Influenza B by PCR NEGATIVE NEGATIVE Final    Comment: (NOTE) The Xpert Xpress SARS-CoV-2/FLU/RSV plus assay is intended as an aid in the diagnosis of influenza from Nasopharyngeal swab specimens and should not be used as a sole basis for treatment. Nasal washings and aspirates are unacceptable for Xpert Xpress SARS-CoV-2/FLU/RSV testing.  Fact Sheet for Patients: BloggerCourse.com  Fact Sheet for Healthcare Providers: SeriousBroker.it  This test is not yet approved or cleared by the [place]  FDA and has been authorized for detection and/or diagnosis of SARS-CoV-2 by FDA under an Emergency Use Authorization (EUA). This EUA will remain in effect (meaning this test can be used) for the duration of the COVID-19 declaration under Section 564(b)(1) of the Act, 21 U.S.C. section 360bbb-3(b)(1), unless the authorization is terminated or revoked.     Resp Syncytial Virus by PCR NEGATIVE NEGATIVE Final    Comment: (NOTE) Fact Sheet for Patients: BloggerCourse.com  Fact Sheet for Healthcare Providers: SeriousBroker.it  This test is not yet approved or cleared by the [place]  FDA and has been authorized for detection and/or diagnosis of SARS-CoV-2 by FDA under an Emergency Use Authorization (EUA). This EUA will remain in effect (meaning this test can be used) for the duration of the COVID-19 declaration under Section 564(b)(1) of the Act, 21 U.S.C. section 360bbb-3(b)(1), unless the authorization is terminated or revoked.  Performed at  [facility], [address] [place], [place] [address]     Coagulation Studies: No results for input(s): LABPROT, INR in the last 72 hours.  Urinalysis: No results for input(s): COLORURINE, LABSPEC, PHURINE, GLUCOSEU, HGBUR, BILIRUBINUR, KETONESUR, PROTEINUR, UROBILINOGEN, NITRITE, LEUKOCYTESUR in the last 72 hours.  Invalid input(s): APPERANCEUR    Imaging: No results found.   Medications:     buPROPion   300 mg Oral Daily   calcitRIOL   0.25 mcg Oral Daily   calcium  carbonate  1 tablet Oral TID WC   Chlorhexidine  Gluconate Cloth  6 each Topical Daily   feeding supplement  237 mL Oral TID BM   fludrocortisone   0.1 mg Oral Daily   heparin   5,000 Units Subcutaneous Q8H   midodrine   10 mg Oral TID WC   venlafaxine  XR  150 mg Oral Q breakfast   And   venlafaxine  XR  75 mg Oral Q breakfast   acetaminophen , ondansetron  (ZOFRAN ) IV, ondansetron   Assessment/ Plan:  Brady Edwards is a 58 y.o.  male with a PMHx of hypertension, atrial fibrillation, BPH, hyperlipidemia, anxiety, constipation, morbid obesity, and end stage renal disease on hemodialysis.  [facility] [place]/TTS/Rt permcath   End stage renal disease on hemodialysis   Patient seen and evaluated on dialysis. Tolerating well.    2.  Acute metabolic acidosis.  Serum bicarbonate 16 on admission. Correcting with dialysis, 21 today   3.  Anemia of chronic kidney disease.  Hemoglobin 12.4. No need for ESA during this admission   4.  Secondary hyperparathyroidism. PTH 199 during recent admission. Phos 7.6 Ca 9.0 Will start Renvela  2 tabs TID   LOS: 2 Orby Tangen P Alexsa Flaum 8/23/202510:54 AM

## 2024-04-21 NOTE — Progress Notes (Signed)
 PROGRESS NOTE    Brady Edwards  FMW:[id number] DOB: 03-09-1966 DOA: 04/15/2024 PCP: Vicci Duwaine SQUIBB, DO  147A/147A-AA  LOS: 2 days   Brief hospital course:   Brady Edwards is a 58 y.o. male with medical history significant of newly ESRD recently started on HD, atrial fibrillation, anxiety and depression, morbid obesity who presented with complaints of weakness and falls.   Pt was very frustrated with his medical and mental problems, and wanted us  to make him better.  Pt complained of fatigue, lack of energy, and weakness, causing to fall several times since he was discharged from hospital on 04/13/24.  No focal neuro deficits.  Pt said he has had long standing mental health issues and needs to see psych.  Pt complained of constipation for the past 2 months that wasn't addressed during his last hospitalization.     Of note, pt was hospitalized from 04/07/24 to 04/13/24 during which he was started on dialysis, and was discharged after dialysis on 8/15 to continue outpatient dialysis TTS.   ED Course: initial vitals: afebrile, pulse 105, BP 86/65, RR 12, sating 99% on room air.  Labs consistent with ESRD.  CXR no acute finding.  EKG showed sinus tachycardia.  CT head no acute finding.  ED provider reported pt said he would be better off not living at this point but does not have a plan for suicide and is going to receive help.  Psych consult placed.  ED provider contacted oncall nephro, who plans dialysis for pt tomorrow.  Assessment & Plan:  Brady Edwards is a 58 y.o. male with medical history significant of recently diagnosed ESRD now on HD, atrial fibrillation, anxiety and depression, morbid obesity who presented with complaints of weakness and falls.    # Weakness and fall --Fatigue and weakness have been progressive, due to recent hospitalization and decline in health.  CT head wnl, no acute finding. Noted to have elevated BUN (unclear of time line regarding rise in BUN). Also noted  to have significant orthostatic hypotension. Suspect this is likely cause of fall.  --PT/OT recommending SNF   # ESRD on HD TTS Secondary hyperparathyroidism Calcitriol  deficiency  --recently started on dialysis --iHD per nephro   # Orthostatic Hypotension Persistent, symptomatic when going from seated to standing position. No concern for infection. No known neurological conditions that could be contributing.  --Hold home Toprol  due to low BP --Continue midodrine  10 mg TID --TED/compression hose abd binder  -- Continue florinef  0.1mg  every day  #Normocytic anemia, mild  Likely anemia of chronic disease  Stable.  Iron panel within normal limits.  B12 within normal limits. -ESA per nephrology - Follow-up folate    Latest Ref Rng & Units 04/21/2024    4:14 AM 04/20/2024    5:13 AM 04/19/2024    7:46 AM  CBC  WBC 4.0 - 10.5 K/uL 11.6  9.8  8.9   Hemoglobin 13.0 - 17.0 g/dL 87.5  88.1  89.1   Hematocrit 39.0 - 52.0 % 37.6  36.1  34.1   Platelets 150 - 400 K/uL 261  232  245     # Hx of Afib --pt not taking anticoagulation PTA.  Currently in sinus. --hold home Toprol  due to low BP   # Depression and hx of panic disorder # Passive SI --psych consulted --no need for SI precaution, per psych --cont home bupropion  and Effexor    # OSA --CPAP nightly   # Obesity, Class III, BMI 40-49.9 (morbid  obesity)    DVT prophylaxis: Heparin  SQ Code Status: Full code  Family Communication:  Level of care: Med-Surg Dispo:   The patient is from: home Anticipated d/c is to: SNF rehab Anticipated d/c date is: whenever SNF accepts    Subjective and Interval History:  Continues to have some lightheadedness when standing.  Objective: Vitals:   04/21/24 1100 04/21/24 1118 04/21/24 1128 04/21/24 1551  BP: 94/60 (!) 86/64 92/68 (!) 104/53  Pulse: 92 88 78 83  Resp: 20  18 16   Temp:   97.8 F (36.6 C) 98.2 F (36.8 C)  TempSrc:    Oral  SpO2: 95%  96% 98%  Weight:   119 kg    Height:        Intake/Output Summary (Last 24 hours) at 04/21/2024 1556 Last data filed at 04/21/2024 1300 Gross per 24 hour  Intake 240 ml  Output 200 ml  Net 40 ml   Filed Weights   04/19/24 1237 04/21/24 0720 04/21/24 1128  Weight: 121.5 kg 120 kg 119 kg    Examination:   Constitutional: In no distress.  Poor dentition Cardiovascular: Normal rate, regular rhythm. No lower extremity edema  Pulmonary: Non labored breathing on room air, no wheezing or rales.   Abdominal: Soft. Obese. Non distended and non tender Musculoskeletal: Normal range of motion.     Neurological: Alert and oriented to person, place, and time. Non focal  Skin: Skin is warm and dry.   Data Reviewed: I have personally reviewed labs and imaging studies  Time spent: 35 minutes  Alban Pepper, MD [facility] If 7PM-7AM, please contact night-coverage 04/21/2024, 3:56 PM

## 2024-04-21 NOTE — Plan of Care (Signed)
   Problem: Education: Goal: Knowledge of General Education information will improve Description Including pain rating scale, medication(s)/side effects and non-pharmacologic comfort measures Outcome: Progressing   Problem: Health Behavior/Discharge Planning: Goal: Ability to manage health-related needs will improve Outcome: Progressing

## 2024-04-22 DIAGNOSIS — R531 Weakness: Secondary | ICD-10-CM | POA: Diagnosis not present

## 2024-04-22 DIAGNOSIS — I951 Orthostatic hypotension: Secondary | ICD-10-CM | POA: Diagnosis not present

## 2024-04-22 LAB — FOLATE: Folate: 18.3 ng/mL (ref >5.9)

## 2024-04-22 LAB — CBC
HCT: 35.3 % — ABNORMAL LOW (ref 39.0–52.0)
Hemoglobin: 11.6 g/dL — ABNORMAL LOW (ref 13.0–17.0)
MCH: 30.7 pg (ref 26.0–34.0)
MCHC: 32.9 g/dL (ref 30.0–36.0)
MCV: 93.4 fL (ref 80.0–100.0)
Platelets: 232 K/uL (ref 150–400)
RBC: 3.78 MIL/uL — ABNORMAL LOW (ref 4.22–5.81)
RDW: 12.8 % (ref 11.5–15.5)
WBC: 10.2 K/uL (ref 4.0–10.5)
nRBC: 0 % (ref 0.0–0.2)

## 2024-04-22 LAB — LACTIC ACID, PLASMA
Lactic Acid, Venous: 1.3 mmol/L (ref 0.5–1.9)
Lactic Acid, Venous: 1.4 mmol/L (ref 0.5–1.9)

## 2024-04-22 MED ORDER — HEPARIN SODIUM (PORCINE) 1000 UNIT/ML DIALYSIS: 1000.0000 [IU] | INTRAMUSCULAR | Status: DC | PRN

## 2024-04-22 MED ORDER — ALTEPLASE 2 MG IJ SOLR
2.0000 mg | Freq: Once | INTRAMUSCULAR | Status: AC | PRN
  Administered 2024-04-24: 2 mg

## 2024-04-22 MED ORDER — ALTEPLASE 2 MG IJ SOLR: 2.0000 mg | Freq: Once | INTRAMUSCULAR | Status: DC | PRN

## 2024-04-22 NOTE — Progress Notes (Signed)
 [facility]  ROUNDING NOTE   Subjective:  No acute events overnight. Patient seen and evaluated at bedside. Patient laying supine, on room. Denies diarrhea, denies constipation. Patient reports tolerating dialysis well yesterday but concerned for fluid, reports decrease in urine output. Will plan for gentle UF for next dialysis treatment.   Objective:  Vital signs in last 24 hours:  Temp:  [97.5 F (36.4 C)-98.2 F (36.8 C)] 97.5 F (36.4 C) (08/24 0839) Pulse Rate:  [82-98] 98 (08/24 0839) Resp:  [16-18] 17 (08/24 0839) BP: (72-113)/(53-66) 95/65 (08/24 0839) SpO2:  [94 %-98 %] 98 % (08/24 0839)  Weight change:  Filed Weights   04/19/24 1237 04/21/24 0720 04/21/24 1128  Weight: 121.5 kg 120 kg 119 kg    Intake/Output: I/O last 3 completed shifts: In: 480 [P.O.:480] Out: 200 [Other:200]   Intake/Output this shift:  No intake/output data recorded.  Physical Exam: General: NAD, missing teeth, large habitus  Head: Normocephalic  Eyes: Anicteric  Neck: Supple  Lungs:  Clear,m room air  Heart: Regular rate  Abdomen:  Soft, distended   Extremities:  None peripheral edema.  Neurologic: Alert, awake  Skin: No lesions  Access: Rt chest permcath    Basic Metabolic Panel: Recent Labs  Lab 04/15/24 1529 04/17/24 1722 04/19/24 0746 04/21/24 0745  NA 140 137 139 136  K 4.1 4.0 3.8 3.5  CL 107 103 99 96*  CO2 16* 19* 21* 21*  GLUCOSE 158* 96 100* 98  BUN 54* 71* 47* 43*  CREATININE 10.53* 14.37* 10.83* 11.36*  CALCIUM  8.7* 9.0 9.0 9.0  MG 2.2  --   --   --   PHOS  --  7.5*  --  7.6*    Liver Function Tests: Recent Labs  Lab 04/15/24 1529 04/17/24 1722 04/21/24 0745  AST 27  --   --   ALT 26  --   --   ALKPHOS 80  --   --   BILITOT 0.9  --   --   PROT 6.4*  --   --   ALBUMIN  3.1* 3.3* 3.9   No results for input(s): LIPASE, AMYLASE in the last 168 hours. Recent Labs  Lab 04/15/24 1606  AMMONIA 20    CBC: Recent Labs  Lab  04/15/24 1529 04/17/24 1720 04/19/24 0746 04/20/24 0513 04/21/24 0414 04/22/24 0521  WBC 9.9 9.6 8.9 9.8 11.6* 10.2  NEUTROABS 6.8  --   --   --   --   --   HGB 10.6* 10.2* 10.8* 11.8* 12.4* 11.6*  HCT 31.9* 31.0* 34.1* 36.1* 37.6* 35.3*  MCV 93.0 94.5 96.1 92.8 92.6 93.4  PLT 223 227 245 232 261 232    Cardiac Enzymes: No results for input(s): CKTOTAL, CKMB, CKMBINDEX, TROPONINI in the last 168 hours.  BNP: Invalid input(s): POCBNP  CBG: No results for input(s): GLUCAP in the last 168 hours.  Microbiology: Results for orders placed or performed during the hospital encounter of 04/07/24  Resp panel by RT-PCR (RSV, Flu A&B, Covid) Anterior Nasal Swab     Status: None   Collection Time: 04/07/24  4:46 PM   Specimen: Anterior Nasal Swab  Result Value Ref Range Status   SARS Coronavirus 2 by RT PCR NEGATIVE NEGATIVE Final    Comment: (NOTE) SARS-CoV-2 target nucleic acids are NOT DETECTED.  The SARS-CoV-2 RNA is generally detectable in upper respiratory specimens during the acute phase of infection. The lowest concentration of SARS-CoV-2 viral copies this assay can detect is  138 copies/mL. A negative result does not preclude SARS-Cov-2 infection and should not be used as the sole basis for treatment or other patient management decisions. A negative result may occur with  improper specimen collection/handling, submission of specimen other than nasopharyngeal swab, presence of viral mutation(s) within the areas targeted by this assay, and inadequate number of viral copies(<138 copies/mL). A negative result must be combined with clinical observations, patient history, and epidemiological information. The expected result is Negative.  Fact Sheet for Patients:  BloggerCourse.com  Fact Sheet for Healthcare Providers:  SeriousBroker.it  This test is no t yet approved or cleared by the [place]  FDA and  has  been authorized for detection and/or diagnosis of SARS-CoV-2 by FDA under an Emergency Use Authorization (EUA). This EUA will remain  in effect (meaning this test can be used) for the duration of the COVID-19 declaration under Section 564(b)(1) of the Act, 21 U.S.C.section 360bbb-3(b)(1), unless the authorization is terminated  or revoked sooner.       Influenza A by PCR NEGATIVE NEGATIVE Final   Influenza B by PCR NEGATIVE NEGATIVE Final    Comment: (NOTE) The Xpert Xpress SARS-CoV-2/FLU/RSV plus assay is intended as an aid in the diagnosis of influenza from Nasopharyngeal swab specimens and should not be used as a sole basis for treatment. Nasal washings and aspirates are unacceptable for Xpert Xpress SARS-CoV-2/FLU/RSV testing.  Fact Sheet for Patients: BloggerCourse.com  Fact Sheet for Healthcare Providers: SeriousBroker.it  This test is not yet approved or cleared by the [place]  FDA and has been authorized for detection and/or diagnosis of SARS-CoV-2 by FDA under an Emergency Use Authorization (EUA). This EUA will remain in effect (meaning this test can be used) for the duration of the COVID-19 declaration under Section 564(b)(1) of the Act, 21 U.S.C. section 360bbb-3(b)(1), unless the authorization is terminated or revoked.     Resp Syncytial Virus by PCR NEGATIVE NEGATIVE Final    Comment: (NOTE) Fact Sheet for Patients: BloggerCourse.com  Fact Sheet for Healthcare Providers: SeriousBroker.it  This test is not yet approved or cleared by the [place]  FDA and has been authorized for detection and/or diagnosis of SARS-CoV-2 by FDA under an Emergency Use Authorization (EUA). This EUA will remain in effect (meaning this test can be used) for the duration of the COVID-19 declaration under Section 564(b)(1) of the Act, 21 U.S.C. section 360bbb-3(b)(1), unless the  authorization is terminated or revoked.  Performed at [facility], [address] [place], [place] [address]     Coagulation Studies: No results for input(s): LABPROT, INR in the last 72 hours.  Urinalysis: No results for input(s): COLORURINE, LABSPEC, PHURINE, GLUCOSEU, HGBUR, BILIRUBINUR, KETONESUR, PROTEINUR, UROBILINOGEN, NITRITE, LEUKOCYTESUR in the last 72 hours.  Invalid input(s): APPERANCEUR    Imaging: No results found.   Medications:     buPROPion   300 mg Oral Daily   calcitRIOL   0.25 mcg Oral Daily   Chlorhexidine  Gluconate Cloth  6 each Topical Daily   feeding supplement  237 mL Oral TID BM   fludrocortisone   0.1 mg Oral Daily   heparin   5,000 Units Subcutaneous Q8H   midodrine   10 mg Oral TID WC   sevelamer  carbonate  1,600 mg Oral TID WC   venlafaxine  XR  150 mg Oral Q breakfast   And   venlafaxine  XR  75 mg Oral Q breakfast   acetaminophen , ondansetron  (ZOFRAN ) IV, ondansetron   Assessment/ Plan:  [name] is a 58 y.o.  male  with a PMHx of hypertension, atrial fibrillation, BPH, hyperlipidemia, anxiety, constipation, morbid obesity, and end stage renal disease on hemodialysis.    [facility] [place]/TTS/Rt permcath   End stage renal disease on hemodialysis              Patient had dialysis yesterday, tolerated well. 0UF due to soft pressures. Next treatment for Tuesday as scheduled, will increase UF as tolerated next treatment. Patient on midodrine  for three times daily.   2.  Acute metabolic acidosis.  Serum bicarbonate 16 on admission. Correcting with dialysis, currently 21   3.  Anemia of chronic kidney disease.  Hemoglobin 11.6 No need for ESA during this admission   4.  Secondary hyperparathyroidism. PTH 199 during recent admission. Phos 7.6 Ca 9.0 Continue Renvela  2 tabs TID     LOS: 3 Mikayla Chiusano P Odyn Turko 8/24/202511:47 AM

## 2024-04-22 NOTE — Plan of Care (Signed)

## 2024-04-22 NOTE — Progress Notes (Signed)
 PROGRESS NOTE    Brady Edwards  FMW:[id number] DOB: [birth year] DOA: 04/15/2024 PCP: Vicci Duwaine SQUIBB, DO  147A/147A-AA  LOS: 3 days   Brief hospital course:   Brady Edwards is a 58 y.o. male with medical history significant of newly ESRD recently started on HD, atrial fibrillation, anxiety and depression, morbid obesity who presented with complaints of weakness and falls.   Pt was very frustrated with his medical and mental problems, and wanted us  to make him better.  Pt complained of fatigue, lack of energy, and weakness, causing to fall several times since he was discharged from hospital on 04/13/24.  No focal neuro deficits.  Pt said he has had long standing mental health issues and needs to see psych.  Pt complained of constipation for the past 2 months that wasn't addressed during his last hospitalization.     Of note, pt was hospitalized from 04/07/24 to 04/13/24 during which he was started on dialysis, and was discharged after dialysis on 8/15 to continue outpatient dialysis TTS.   ED Course: initial vitals: afebrile, pulse 105, BP 86/65, RR 12, sating 99% on room air.  Labs consistent with ESRD.  CXR no acute finding.  EKG showed sinus tachycardia.  CT head no acute finding.  ED provider reported pt said he would be better off not living at this point but does not have a plan for suicide and is going to receive help.  Psych consult placed.  ED provider contacted oncall nephro, who plans dialysis for pt tomorrow.  Assessment & Plan:  Brady Edwards is a 58 y.o. male with medical history significant of recently diagnosed ESRD now on HD, atrial fibrillation, anxiety and depression, morbid obesity who presented with complaints of weakness and falls.    # Orthostatic Hypotension Persistent, symptomatic when going from seated to standing position. No concern for infection. No known neurological conditions that could be contributing.  -- Continue to hold home Toprol  due to low  BP --Continue midodrine  10 mg TID --TED/compression hose abd binder  -- Continue florinef  0.1mg  every day  # Weakness and fall improving --Fatigue and weakness have been progressive, due to recent hospitalization and decline in health.  CT head wnl, no acute finding. Noted to have elevated BUN (unclear of time line regarding rise in BUN). Also noted to have significant orthostatic hypotension. Suspect this is likely cause of fall.  --PT/OT recommending SNF   # ESRD on HD TTS Secondary hyperparathyroidism Calcitriol  deficiency  --recently started on dialysis --iHD per nephro   #Normocytic anemia, mild  Likely anemia of chronic disease  Stable.  Iron panel within normal limits.  B12 within normal limits. -ESA per nephrology - Follow-up folate    Latest Ref Rng & Units 04/22/2024    5:21 AM 04/21/2024    4:14 AM 04/20/2024    5:13 AM  CBC  WBC 4.0 - 10.5 K/uL 10.2  11.6  9.8   Hemoglobin 13.0 - 17.0 g/dL 88.3  87.5  88.1   Hematocrit 39.0 - 52.0 % 35.3  37.6  36.1   Platelets 150 - 400 K/uL 232  261  232     # Hx of Afib --pt not taking anticoagulation PTA.  Currently in sinus. --hold home Toprol  due to low BP   # Depression and hx of panic disorder # Passive SI --psych consulted --no need for SI precaution, per psych --cont home bupropion  and Effexor    # OSA --CPAP nightly   # Obesity, Class  III, BMI 40-49.9 (morbid obesity)    DVT prophylaxis: Heparin  SQ Code Status: Full code  Family Communication:  Level of care: Med-Surg Dispo:   The patient is from: home Anticipated d/c is to: SNF rehab Anticipated d/c date is: whenever SNF accepts    Subjective and Interval History:  No acute issues overnight.  Objective: Vitals:   04/22/24 0348 04/22/24 0549 04/22/24 0839 04/22/24 1232  BP: (!) 72/58 (!) 79/59 95/65 90/60   Pulse: 82 96 98 95  Resp: 17 18 17 17   Temp: 98.2 F (36.8 C) 98.1 F (36.7 C) (!) 97.5 F (36.4 C) 98.6 F (37 C)  TempSrc: Oral Oral  Oral Oral  SpO2: 97% 98% 98% 98%  Weight:      Height:        Intake/Output Summary (Last 24 hours) at 04/22/2024 1411 Last data filed at 04/22/2024 1300 Gross per 24 hour  Intake 240 ml  Output --  Net 240 ml   Filed Weights   04/19/24 1237 04/21/24 0720 04/21/24 1128  Weight: 121.5 kg 120 kg 119 kg    Examination:    Constitutional: In no distress. Resting in bed Cardiovascular: Normal rate, regular rhythm. No lower extremity edema  Pulmonary: Non labored breathing on room air, no wheezing or rales.   Abdominal: Soft. Non distended and non tender Musculoskeletal: Normal range of motion.     Skin: Skin is warm and dry.    Data Reviewed: I have personally reviewed labs and imaging studies  Time spent: 35 minutes  Alban Pepper, MD [facility] If 7PM-7AM, please contact night-coverage 04/22/2024, 2:11 PM

## 2024-04-23 DIAGNOSIS — R531 Weakness: Secondary | ICD-10-CM | POA: Diagnosis not present

## 2024-04-23 DIAGNOSIS — I951 Orthostatic hypotension: Secondary | ICD-10-CM | POA: Diagnosis not present

## 2024-04-23 LAB — CBC
HCT: 35.6 % — ABNORMAL LOW (ref 39.0–52.0)
Hemoglobin: 11.3 g/dL — ABNORMAL LOW (ref 13.0–17.0)
MCH: 30.4 pg (ref 26.0–34.0)
MCHC: 31.7 g/dL (ref 30.0–36.0)
MCV: 95.7 fL (ref 80.0–100.0)
Platelets: 236 K/uL (ref 150–400)
RBC: 3.72 MIL/uL — ABNORMAL LOW (ref 4.22–5.81)
RDW: 12.9 % (ref 11.5–15.5)
WBC: 10.1 K/uL (ref 4.0–10.5)
nRBC: 0 % (ref 0.0–0.2)

## 2024-04-23 LAB — RENAL FUNCTION PANEL
Albumin: 4.4 g/dL (ref 3.5–5.0)
Anion gap: 18 — ABNORMAL HIGH (ref 5–15)
BUN: 44 mg/dL — ABNORMAL HIGH (ref 6–20)
CO2: 25 mmol/L (ref 22–32)
Calcium: 10.1 mg/dL (ref 8.9–10.3)
Chloride: 92 mmol/L — ABNORMAL LOW (ref 98–111)
Creatinine, Ser: 11.74 mg/dL — ABNORMAL HIGH (ref 0.61–1.24)
GFR, Estimated: 5 mL/min — ABNORMAL LOW (ref >60)
Glucose, Bld: 106 mg/dL — ABNORMAL HIGH (ref 70–99)
Phosphorus: 7.4 mg/dL — ABNORMAL HIGH (ref 2.5–4.6)
Potassium: 3.6 mmol/L (ref 3.5–5.1)
Sodium: 135 mmol/L (ref 135–145)

## 2024-04-23 NOTE — Progress Notes (Signed)
 Mobility Specialist - Progress Note   04/23/24 1450  Mobility  Activity Refused and notified nurse if applicable   Pt expressed feeling tired and not wanting to get out of bed this date.  America Silvan Mobility Specialist 04/23/24 2:50 PM

## 2024-04-23 NOTE — Progress Notes (Signed)
 PROGRESS NOTE    Brady Edwards  FMW:[id number] DOB: 27-Sep-1965 DOA: 04/15/2024 PCP: Vicci Duwaine SQUIBB, DO  147A/[id number]-AA  LOS: 4 days   Brief hospital course:   Brady CERVENY is a 58 y.o. male with medical history significant of newly ESRD recently started on HD, atrial fibrillation, anxiety and depression, morbid obesity who presented with complaints of weakness and falls.   Pt was very frustrated with his medical and mental problems, and wanted us  to make him better.  Pt complained of fatigue, lack of energy, and weakness, causing to fall several times since he was discharged from hospital on 04/13/24.  No focal neuro deficits.  Pt said he has had long standing mental health issues and needs to see psych.  Pt complained of constipation for the past 2 months that wasn't addressed during his last hospitalization.     Of note, pt was hospitalized from 04/07/24 to 04/13/24 during which he was started on dialysis, and was discharged after dialysis on 8/15 to continue outpatient dialysis TTS.   ED Course: initial vitals: afebrile, pulse 105, BP 86/65, RR 12, sating 99% on room air.  Labs consistent with ESRD.  CXR no acute finding.  EKG showed sinus tachycardia.  CT head no acute finding.  ED provider reported pt said he would be better off not living at this point but does not have a plan for suicide and is going to receive help.  Psych consult placed.  ED provider contacted oncall nephro, who plans dialysis for pt tomorrow.  Assessment & Plan:  Brady Edwards is a 58 y.o. male with medical history significant of recently diagnosed ESRD now on HD, atrial fibrillation, anxiety and depression, morbid obesity who presented with complaints of weakness and falls.    # Orthostatic Hypotension Persistent, symptomatic when going from seated to standing position. No concern for infection. No known neurological conditions that could be contributing.  -- Continue to hold home Toprol  due to low  BP --Continue midodrine  10 mg TID --TED/compression hose abd binder  -- Continue florinef  0.1mg  every day --Fluid intake liberalized per nephro --F/u am cortisol   # Weakness and fall improving --Fatigue and weakness have been progressive, due to recent hospitalization and decline in health.  CT head wnl, no acute finding. Noted to have elevated BUN (unclear of time line regarding rise in BUN). Also noted to have significant orthostatic hypotension. Suspect this is likely cause of fall.  --PT/OT recommending SNF   # ESRD on HD TTS Secondary hyperparathyroidism Calcitriol  deficiency  --recently started on dialysis --iHD per nephro   #Normocytic anemia, mild  Likely anemia of chronic disease  Stable.  Iron panel within normal limits.  B12 within normal limits.  Folate within normal limits -ESA per nephrology     Latest Ref Rng & Units 04/23/2024    4:22 AM 04/22/2024    5:21 AM 04/21/2024    4:14 AM  CBC  WBC 4.0 - 10.5 K/uL 10.1  10.2  11.6   Hemoglobin 13.0 - 17.0 g/dL 88.6  88.3  87.5   Hematocrit 39.0 - 52.0 % 35.6  35.3  37.6   Platelets 150 - 400 K/uL 236  232  261     # Hx of Afib --pt not taking anticoagulation PTA.  Currently in sinus. --hold home Toprol  due to low BP   # Depression and hx of panic disorder # Passive SI --psych consulted --no need for SI precaution, per psych --cont home bupropion  and  Effexor    # OSA --CPAP nightly   # Obesity, Class III, BMI 40-49.9 (morbid obesity)    DVT prophylaxis: Heparin  SQ Code Status: Full code  Family Communication:  Level of care: Med-Surg Dispo:   The patient is from: home Anticipated d/c is to: SNF rehab Anticipated d/c date is: whenever SNF accepts    Subjective and Interval History:  No acute issues overnight.  Objective: Vitals:   04/23/24 0856 04/23/24 0939 04/23/24 1340 04/23/24 1711  BP: 98/60 (!) 96/53 105/64 (!) 102/59  Pulse: 70 70 86 71  Resp: 18 18 17 17   Temp: 97.7 F (36.5 C) 97.7 F  (36.5 C) 97.9 F (36.6 C) 98 F (36.7 C)  TempSrc: Oral Oral Oral Oral  SpO2: 96% 96% 98% 100%  Weight:      Height:        Intake/Output Summary (Last 24 hours) at 04/23/2024 1838 Last data filed at 04/23/2024 0900 Gross per 24 hour  Intake 240 ml  Output --  Net 240 ml   Filed Weights   04/19/24 1237 04/21/24 0720 04/21/24 1128  Weight: 121.5 kg 120 kg 119 kg    Examination:   Constitutional: In no distress.  Cardiovascular: Normal rate, regular rhythm. No lower extremity edema  Pulmonary: Non labored breathing on room air, no wheezing or rales.   Abdominal: Soft. Non distended and non tender Musculoskeletal: Normal range of motion.     Neurological: Alert and oriented to person, place, and time. Non focal  Skin: Skin is warm and dry.    Data Reviewed: I have personally reviewed labs and imaging studies  Time spent: 35 minutes  Alban Pepper, MD [facility] If 7PM-7AM, please contact night-coverage 04/23/2024, 6:38 PM

## 2024-04-23 NOTE — Plan of Care (Signed)

## 2024-04-23 NOTE — Progress Notes (Signed)
 The patient continues to be a yellow MEWS score. Charge, nurse is aware, MD is aware, Primary nurse and nurse tech. The pt's blood pressure continues to be orthostatic.   04/23/24 0939  Assess: MEWS Score  Temp 97.7 F (36.5 C)  BP (!) 96/53  Pulse Rate 70  Resp 18  Level of Consciousness Alert  SpO2 96 %  O2 Device Room Air  Patient Activity (if Appropriate) In bed  Assess: MEWS Score  MEWS Temp 0  MEWS Systolic 1  MEWS Pulse 0  MEWS RR 0  MEWS LOC 0  MEWS Score 1  MEWS Score Color Green  Assess: if the MEWS score is Yellow or Red  Were vital signs accurate and taken at a resting state? Yes  Does the patient meet 2 or more of the SIRS criteria? No  Does the patient have a confirmed or suspected source of infection? No  MEWS guidelines implemented  Yes, yellow  Treat  MEWS Interventions Considered administering scheduled or prn medications/treatments as ordered  Take Vital Signs  Increase Vital Sign Frequency  Yellow: Q2hr x1, continue Q4hrs until patient remains green for 12hrs  Escalate  MEWS: Escalate Yellow: Discuss with charge nurse and consider notifying provider and/or RRT  Notify: Charge Nurse/RN  Name of Charge Nurse/RN Notified Andril, RN  Provider Notification  Provider Name/Title Dr. Alban Pepper  Date Provider Notified 04/23/24  Time Provider Notified [phone]  Method of Notification Page (secure chat)  Notification Reason Other (Comment) (patient continues to be yellow MEWS score, orthostatic)  Provider response No new orders  Date of Provider Response 04/23/24  Time of Provider Response 0945  Assess: SIRS CRITERIA  SIRS Temperature  0  SIRS Respirations  0  SIRS Pulse 0  SIRS WBC 0  SIRS Score Sum  0

## 2024-04-23 NOTE — Progress Notes (Signed)
 [facility]  ROUNDING NOTE   Subjective:   Patient seen sitting at side of bed Nursing currently taking orthostatics,  patient symptomatic with dizziness   Objective:  Vital signs in last 24 hours:  Temp:  [97.7 F (36.5 C)-99.1 F (37.3 C)] 97.7 F (36.5 C) (08/25 0939) Pulse Rate:  [70-110] 70 (08/25 0939) Resp:  [16-18] 18 (08/25 0939) BP: (81-98)/(53-69) 96/53 (08/25 0939) SpO2:  [96 %-98 %] 96 % (08/25 0939)  Weight change:  Filed Weights   04/19/24 1237 04/21/24 0720 04/21/24 1128  Weight: 121.5 kg 120 kg 119 kg    Intake/Output: I/O last 3 completed shifts: In: 330 [P.O.:330] Out: -    Intake/Output this shift:  Total I/O In: 240 [P.O.:240] Out: -   Physical Exam: General: NAD, missing teeth, large habitus  Head: Normocephalic  Eyes: Anicteric  Lungs:  Clear,m room air  Heart: Regular rate  Abdomen:  Soft, distended   Extremities:  None peripheral edema.  Neurologic: Alert, awake  Skin: No lesions  Access: Rt chest permcath    Basic Metabolic Panel: Recent Labs  Lab 04/17/24 1722 04/19/24 0746 04/21/24 0745 04/23/24 0422  NA 137 139 136 135  K 4.0 3.8 3.5 3.6  CL 103 99 96* 92*  CO2 19* 21* 21* 25  GLUCOSE 96 100* 98 106*  BUN 71* 47* 43* 44*  CREATININE 14.37* 10.83* 11.36* 11.74*  CALCIUM  9.0 9.0 9.0 10.1  PHOS 7.5*  --  7.6* 7.4*    Liver Function Tests: Recent Labs  Lab 04/17/24 1722 04/21/24 0745 04/23/24 0422  ALBUMIN  3.3* 3.9 4.4   No results for input(s): LIPASE, AMYLASE in the last 168 hours. No results for input(s): AMMONIA in the last 168 hours.   CBC: Recent Labs  Lab 04/19/24 0746 04/20/24 0513 04/21/24 0414 04/22/24 0521 04/23/24 0422  WBC 8.9 9.8 11.6* 10.2 10.1  HGB 10.8* 11.8* 12.4* 11.6* 11.3*  HCT 34.1* 36.1* 37.6* 35.3* 35.6*  MCV 96.1 92.8 92.6 93.4 95.7  PLT 245 232 261 232 236    Cardiac Enzymes: No results for input(s): CKTOTAL, CKMB, CKMBINDEX, TROPONINI in the  last 168 hours.  BNP: Invalid input(s): POCBNP  CBG: No results for input(s): GLUCAP in the last 168 hours.  Microbiology: Results for orders placed or performed during the hospital encounter of 04/07/24  Resp panel by RT-PCR (RSV, Flu A&B, Covid) Anterior Nasal Swab     Status: None   Collection Time: 04/07/24  4:46 PM   Specimen: Anterior Nasal Swab  Result Value Ref Range Status   SARS Coronavirus 2 by RT PCR NEGATIVE NEGATIVE Final    Comment: (NOTE) SARS-CoV-2 target nucleic acids are NOT DETECTED.  The SARS-CoV-2 RNA is generally detectable in upper respiratory specimens during the acute phase of infection. The lowest concentration of SARS-CoV-2 viral copies this assay can detect is 138 copies/mL. A negative result does not preclude SARS-Cov-2 infection and should not be used as the sole basis for treatment or other patient management decisions. A negative result may occur with  improper specimen collection/handling, submission of specimen other than nasopharyngeal swab, presence of viral mutation(s) within the areas targeted by this assay, and inadequate number of viral copies(<138 copies/mL). A negative result must be combined with clinical observations, patient history, and epidemiological information. The expected result is Negative.  Fact Sheet for Patients:  BloggerCourse.com  Fact Sheet for Healthcare Providers:  SeriousBroker.it  This test is no t yet approved or cleared by the [place]   FDA and  has been authorized for detection and/or diagnosis of SARS-CoV-2 by FDA under an Emergency Use Authorization (EUA). This EUA will remain  in effect (meaning this test can be used) for the duration of the COVID-19 declaration under Section 564(b)(1) of the Act, 21 U.S.C.section 360bbb-3(b)(1), unless the authorization is terminated  or revoked sooner.       Influenza A by PCR NEGATIVE NEGATIVE Final    Influenza B by PCR NEGATIVE NEGATIVE Final    Comment: (NOTE) The Xpert Xpress SARS-CoV-2/FLU/RSV plus assay is intended as an aid in the diagnosis of influenza from Nasopharyngeal swab specimens and should not be used as a sole basis for treatment. Nasal washings and aspirates are unacceptable for Xpert Xpress SARS-CoV-2/FLU/RSV testing.  Fact Sheet for Patients: BloggerCourse.com  Fact Sheet for Healthcare Providers: SeriousBroker.it  This test is not yet approved or cleared by the [place]  FDA and has been authorized for detection and/or diagnosis of SARS-CoV-2 by FDA under an Emergency Use Authorization (EUA). This EUA will remain in effect (meaning this test can be used) for the duration of the COVID-19 declaration under Section 564(b)(1) of the Act, 21 U.S.C. section 360bbb-3(b)(1), unless the authorization is terminated or revoked.     Resp Syncytial Virus by PCR NEGATIVE NEGATIVE Final    Comment: (NOTE) Fact Sheet for Patients: BloggerCourse.com  Fact Sheet for Healthcare Providers: SeriousBroker.it  This test is not yet approved or cleared by the [place]  FDA and has been authorized for detection and/or diagnosis of SARS-CoV-2 by FDA under an Emergency Use Authorization (EUA). This EUA will remain in effect (meaning this test can be used) for the duration of the COVID-19 declaration under Section 564(b)(1) of the Act, 21 U.S.C. section 360bbb-3(b)(1), unless the authorization is terminated or revoked.  Performed at [facility], [address] [place], [place] [address]     Coagulation Studies: No results for input(s): LABPROT, INR in the last 72 hours.  Urinalysis: No results for input(s): COLORURINE, LABSPEC, PHURINE, GLUCOSEU, HGBUR, BILIRUBINUR, KETONESUR, PROTEINUR, UROBILINOGEN, NITRITE, LEUKOCYTESUR in the  last 72 hours.  Invalid input(s): APPERANCEUR    Imaging: No results found.   Medications:     buPROPion   300 mg Oral Daily   calcitRIOL   0.25 mcg Oral Daily   Chlorhexidine  Gluconate Cloth  6 each Topical Daily   feeding supplement  237 mL Oral TID BM   fludrocortisone   0.1 mg Oral Daily   heparin   5,000 Units Subcutaneous Q8H   midodrine   10 mg Oral TID WC   sevelamer  carbonate  1,600 mg Oral TID WC   venlafaxine  XR  150 mg Oral Q breakfast   And   venlafaxine  XR  75 mg Oral Q breakfast   acetaminophen , alteplase , heparin , heparin , ondansetron  (ZOFRAN ) IV, ondansetron   Assessment/ Plan:  Brady Edwards is a 58 y.o.  male  with a PMHx of hypertension, atrial fibrillation, BPH, hyperlipidemia, anxiety, constipation, morbid obesity, and end stage renal disease on hemodialysis.    [facility] [place]/TTS/Rt permcath   End stage renal disease on hemodialysis             Next treatment for Tuesday. UF limited due to hypotension.    2.  Acute metabolic acidosis.  Serum bicarbonate 16 on admission. Correcting with dialysis   3.  Anemia of chronic kidney disease.  Hemoglobin 11.3 No need for ESA during this admission   4.  Secondary hyperparathyroidism. PTH 199 during recent admission. Continue Renvela   2 tabs TID. Will continue to monitor     LOS: 4 Shuronda Santino 8/25/202511:52 AM

## 2024-04-23 NOTE — Progress Notes (Signed)
 Positive orthostatic vitals.   04/23/24 0939  Orthostatic Lying   BP- Lying 96/53  Pulse- Lying 68  Orthostatic Sitting  BP- Sitting (!) 72/57  Pulse- Sitting 121  Orthostatic Standing at 0 minutes  BP- Standing at 0 minutes (!) 64/44  Pulse- Standing at 0 minutes 52  Orthostatic Standing at 3 minutes  BP- Standing at 3 minutes (!) 65/37  Pulse- Standing at 3 minutes 125

## 2024-04-23 NOTE — Progress Notes (Signed)
 Nephrology service has been notified: the patient got an order for knee high 20-30 mmHg compression stocking and the order for [facility] was d/c. RN was not able to obtain this stockings, supplies was not able to find this specific stocking either. Nephrology is aware and regular stocking have been placed on the pt.

## 2024-04-24 LAB — CBC WITH DIFFERENTIAL/PLATELET
Abs Immature Granulocytes: 0.04 K/uL (ref 0.00–0.07)
Basophils Absolute: 0.1 K/uL (ref 0.0–0.1)
Basophils Relative: 1 %
Eosinophils Absolute: 0.2 K/uL (ref 0.0–0.5)
Eosinophils Relative: 3 %
HCT: 32.4 % — ABNORMAL LOW (ref 39.0–52.0)
Hemoglobin: 10.6 g/dL — ABNORMAL LOW (ref 13.0–17.0)
Immature Granulocytes: 1 %
Lymphocytes Relative: 42 %
Lymphs Abs: 2.3 K/uL (ref 0.7–4.0)
MCH: 30.9 pg (ref 26.0–34.0)
MCHC: 32.7 g/dL (ref 30.0–36.0)
MCV: 94.5 fL (ref 80.0–100.0)
Monocytes Absolute: 0.3 K/uL (ref 0.1–1.0)
Monocytes Relative: 6 %
Neutro Abs: 2.6 K/uL (ref 1.7–7.7)
Neutrophils Relative %: 47 %
Platelets: 196 K/uL (ref 150–400)
RBC: 3.43 MIL/uL — ABNORMAL LOW (ref 4.22–5.81)
RDW: 13.2 % (ref 11.5–15.5)
WBC: 5.5 K/uL (ref 4.0–10.5)
nRBC: 0 % (ref 0.0–0.2)

## 2024-04-24 LAB — RENAL FUNCTION PANEL
Albumin: 3.7 g/dL (ref 3.5–5.0)
Anion gap: 16 — ABNORMAL HIGH (ref 5–15)
BUN: 44 mg/dL — ABNORMAL HIGH (ref 6–20)
CO2: 24 mmol/L (ref 22–32)
Calcium: 9.3 mg/dL (ref 8.9–10.3)
Chloride: 95 mmol/L — ABNORMAL LOW (ref 98–111)
Creatinine, Ser: 11.49 mg/dL — ABNORMAL HIGH (ref 0.61–1.24)
GFR, Estimated: 5 mL/min — ABNORMAL LOW (ref >60)
Glucose, Bld: 91 mg/dL (ref 70–99)
Phosphorus: 5.8 mg/dL — ABNORMAL HIGH (ref 2.5–4.6)
Potassium: 3.7 mmol/L (ref 3.5–5.1)
Sodium: 135 mmol/L (ref 135–145)

## 2024-04-24 LAB — CORTISOL-AM, BLOOD: Cortisol - AM: 20.6 ug/dL (ref 6.7–22.6)

## 2024-04-24 LAB — TSH: TSH: 0.972 u[IU]/mL (ref 0.350–4.500)

## 2024-04-24 MED ORDER — ALTEPLASE 2 MG IJ SOLR
2.0000 mg | Freq: Once | INTRAMUSCULAR | Status: AC
  Administered 2024-04-24: 2 mg
  Filled 2024-04-24: qty 2

## 2024-04-24 MED ORDER — MIDODRINE HCL 5 MG PO TABS
ORAL_TABLET | ORAL | Status: AC
Start: 2024-04-24 — End: 2024-04-24
  Filled 2024-04-24: qty 2

## 2024-04-24 MED ORDER — ALTEPLASE 2 MG IJ SOLR
4.0000 mg | Freq: Once | INTRAMUSCULAR | Status: AC
  Administered 2024-04-24: 4 mg

## 2024-04-24 MED ORDER — ALTEPLASE 2 MG IJ SOLR
INTRAMUSCULAR | Status: AC
Start: 2024-04-24 — End: 2024-04-24
  Filled 2024-04-24: qty 2

## 2024-04-24 MED ORDER — ALTEPLASE 2 MG IJ SOLR
INTRAMUSCULAR | Status: AC
  Filled 2024-04-24: qty 4

## 2024-04-24 MED ORDER — FLUDROCORTISONE ACETATE 0.1 MG PO TABS
0.2000 mg | ORAL_TABLET | Freq: Every day | ORAL | Status: DC
  Administered 2024-04-25 – 2024-05-02 (×7): 0.2 mg via ORAL
  Filled 2024-04-24 (×9): qty 2

## 2024-04-24 MED ORDER — STERILE WATER FOR INJECTION IJ SOLN
INTRAMUSCULAR | Status: AC
  Filled 2024-04-24: qty 10

## 2024-04-24 NOTE — Progress Notes (Signed)
 [facility]  ROUNDING NOTE   Subjective:   Patient seen and evaluated during dialysis   HEMODIALYSIS FLOWSHEET:  Blood Flow Rate (mL/min): 229 mL/min Arterial Pressure (mmHg): -100.4 mmHg Venous Pressure (mmHg): 94.95 mmHg TMP (mmHg): 23.84 mmHg Ultrafiltration Rate (mL/min): 257 mL/min Dialysate Flow Rate (mL/min): 299 ml/min Dialysis Fluid Bolus: Albumin   Due to malfunctioning HD access, HD RN instilled cathflo and allowed dwell. Will resume treatment after dwell.    Objective:  Vital signs in last 24 hours:  Temp:  [97.6 F (36.4 C)-98 F (36.7 C)] 97.9 F (36.6 C) (08/26 0831) Pulse Rate:  [67-89] 68 (08/26 1000) Resp:  [16-20] 18 (08/26 1000) BP: (80-105)/(50-67) 87/51 (08/26 1000) SpO2:  [92 %-100 %] 96 % (08/26 1000) Weight:  [117.9 kg] 117.9 kg (08/26 0831)  Weight change:  Filed Weights   04/21/24 1128 04/24/24 0830 04/24/24 0831  Weight: 119 kg 117.9 kg 117.9 kg    Intake/Output: I/O last 3 completed shifts: In: 480 [P.O.:480] Out: 50 [Urine:50]   Intake/Output this shift:  No intake/output data recorded.  Physical Exam: General: NAD, missing teeth, large habitus  Head: Normocephalic  Eyes: Anicteric  Lungs:  Clear, room air  Heart: Regular rate  Abdomen:  Soft, distended   Extremities:  None peripheral edema.  Neurologic: Alert, awake  Skin: No lesions  Access: Rt chest permcath    Basic Metabolic Panel: Recent Labs  Lab 04/17/24 1722 04/19/24 0746 04/21/24 0745 04/23/24 0422 04/24/24 0915  NA 137 139 136 135 135  K 4.0 3.8 3.5 3.6 3.7  CL 103 99 96* 92* 95*  CO2 19* 21* 21* 25 24  GLUCOSE 96 100* 98 106* 91  BUN 71* 47* 43* 44* 44*  CREATININE 14.37* 10.83* 11.36* 11.74* 11.49*  CALCIUM  9.0 9.0 9.0 10.1 9.3  PHOS 7.5*  --  7.6* 7.4* 5.8*    Liver Function Tests: Recent Labs  Lab 04/17/24 1722 04/21/24 0745 04/23/24 0422 04/24/24 0915  ALBUMIN  3.3* 3.9 4.4 3.7   No results for input(s): LIPASE, AMYLASE  in the last 168 hours. No results for input(s): AMMONIA in the last 168 hours.   CBC: Recent Labs  Lab 04/20/24 0513 04/21/24 0414 04/22/24 0521 04/23/24 0422 04/24/24 0915  WBC 9.8 11.6* 10.2 10.1 5.5  NEUTROABS  --   --   --   --  2.6  HGB 11.8* 12.4* 11.6* 11.3* 10.6*  HCT 36.1* 37.6* 35.3* 35.6* 32.4*  MCV 92.8 92.6 93.4 95.7 94.5  PLT 232 261 232 236 196    Cardiac Enzymes: No results for input(s): CKTOTAL, CKMB, CKMBINDEX, TROPONINI in the last 168 hours.  BNP: Invalid input(s): POCBNP  CBG: No results for input(s): GLUCAP in the last 168 hours.  Microbiology: Results for orders placed or performed during the hospital encounter of 04/07/24  Resp panel by RT-PCR (RSV, Flu A&B, Covid) Anterior Nasal Swab     Status: None   Collection Time: 04/07/24  4:46 PM   Specimen: Anterior Nasal Swab  Result Value Ref Range Status   SARS Coronavirus 2 by RT PCR NEGATIVE NEGATIVE Final    Comment: (NOTE) SARS-CoV-2 target nucleic acids are NOT DETECTED.  The SARS-CoV-2 RNA is generally detectable in upper respiratory specimens during the acute phase of infection. The lowest concentration of SARS-CoV-2 viral copies this assay can detect is 138 copies/mL. A negative result does not preclude SARS-Cov-2 infection and should not be used as the sole basis for treatment or other patient management decisions. A  negative result may occur with  improper specimen collection/handling, submission of specimen other than nasopharyngeal swab, presence of viral mutation(s) within the areas targeted by this assay, and inadequate number of viral copies(<138 copies/mL). A negative result must be combined with clinical observations, patient history, and epidemiological information. The expected result is Negative.  Fact Sheet for Patients:  BloggerCourse.com  Fact Sheet for Healthcare Providers:  SeriousBroker.it  This test is  no t yet approved or cleared by the [place]  FDA and  has been authorized for detection and/or diagnosis of SARS-CoV-2 by FDA under an Emergency Use Authorization (EUA). This EUA will remain  in effect (meaning this test can be used) for the duration of the COVID-19 declaration under Section 564(b)(1) of the Act, 21 U.S.C.section 360bbb-3(b)(1), unless the authorization is terminated  or revoked sooner.       Influenza A by PCR NEGATIVE NEGATIVE Final   Influenza B by PCR NEGATIVE NEGATIVE Final    Comment: (NOTE) The Xpert Xpress SARS-CoV-2/FLU/RSV plus assay is intended as an aid in the diagnosis of influenza from Nasopharyngeal swab specimens and should not be used as a sole basis for treatment. Nasal washings and aspirates are unacceptable for Xpert Xpress SARS-CoV-2/FLU/RSV testing.  Fact Sheet for Patients: BloggerCourse.com  Fact Sheet for Healthcare Providers: SeriousBroker.it  This test is not yet approved or cleared by the [place]  FDA and has been authorized for detection and/or diagnosis of SARS-CoV-2 by FDA under an Emergency Use Authorization (EUA). This EUA will remain in effect (meaning this test can be used) for the duration of the COVID-19 declaration under Section 564(b)(1) of the Act, 21 U.S.C. section 360bbb-3(b)(1), unless the authorization is terminated or revoked.     Resp Syncytial Virus by PCR NEGATIVE NEGATIVE Final    Comment: (NOTE) Fact Sheet for Patients: BloggerCourse.com  Fact Sheet for Healthcare Providers: SeriousBroker.it  This test is not yet approved or cleared by the [place]  FDA and has been authorized for detection and/or diagnosis of SARS-CoV-2 by FDA under an Emergency Use Authorization (EUA). This EUA will remain in effect (meaning this test can be used) for the duration of the COVID-19 declaration under Section  564(b)(1) of the Act, 21 U.S.C. section 360bbb-3(b)(1), unless the authorization is terminated or revoked.  Performed at [facility], [address] [place], [place] [address]     Coagulation Studies: No results for input(s): LABPROT, INR in the last 72 hours.  Urinalysis: No results for input(s): COLORURINE, LABSPEC, PHURINE, GLUCOSEU, HGBUR, BILIRUBINUR, KETONESUR, PROTEINUR, UROBILINOGEN, NITRITE, LEUKOCYTESUR in the last 72 hours.  Invalid input(s): APPERANCEUR    Imaging: No results found.   Medications:     buPROPion   300 mg Oral Daily   calcitRIOL   0.25 mcg Oral Daily   Chlorhexidine  Gluconate Cloth  6 each Topical Daily   feeding supplement  237 mL Oral TID BM   fludrocortisone   0.1 mg Oral Daily   heparin   5,000 Units Subcutaneous Q8H   midodrine   10 mg Oral TID WC   sevelamer  carbonate  1,600 mg Oral TID WC   venlafaxine  XR  150 mg Oral Q breakfast   And   venlafaxine  XR  75 mg Oral Q breakfast   acetaminophen , heparin , ondansetron  (ZOFRAN ) IV, ondansetron   Assessment/ Plan:  Brady Edwards is a 58 y.o.  male  with a PMHx of hypertension, atrial fibrillation, BPH, hyperlipidemia, anxiety, constipation, morbid obesity, and end stage renal disease on hemodialysis.    [facility]  [place]/TTS/Rt permcath   End stage renal disease on hemodialysis             Receiving dialysis today. Required a cathflo dwell due to sluggish dialysis ports. Access functioning fair during treatment now. Next treatment scheduled for Thursday.     2.  Acute metabolic acidosis.  Serum bicarbonate 16 on admission. Correcting with dialysis   3.  Anemia of chronic kidney disease.  Hemoglobin 10.6. No need for ESA during this admission   4.  Secondary hyperparathyroidism. PTH 199 during recent admission. Continue Renvela  2 tabs TID. Will continue to monitor     LOS: 5 Huckleberry Martinson 8/26/202510:29 AM

## 2024-04-24 NOTE — Progress Notes (Signed)
 Hemodialysis catheter working poorly. Activase  instilled into each lumen per standing orders.

## 2024-04-24 NOTE — Progress Notes (Signed)
 Physical Therapy Treatment Patient Details Name: Brady Edwards MRN: [id number] DOB: 06/09/1966 Today's Date: 04/24/2024   History of Present Illness Brady Edwards is a 58 y.o. male with pmh of hypertension, atrial fibrillation on metoprolol  not on anticoagulation, BPH, hyperlipidemia, anxiety, constipation, morbid obesity recently admitted from 8/9-8/15/25 recently initiated on dialysis who presents from home with general weakness, frequent falls since discharge, shortness of breath and passive suicidal ideation.    PT Comments  Patient reclined in bed on arrival. Obtained orthostatic vitals with patient very symptomatic and deferred OOB mobility due to significantly low BP, see below. Otherwise able to complete bed mobility independently, limited by orthostatic vitals. RN notified and present at end of session.   Orthostatic BPs  Supine 83/62  Sitting 43/27 (MAP 33)  Trendelenburg 96/66  Reclined in bed ~45 degrees HOB  65/38 (47)  Reclined in bed HOB ~38 degrees  74/51 (57)  Reclined in bed HOB ~30 degrees  97/80 (88)      If plan is discharge home, recommend the following: A little help with walking and/or transfers;Help with stairs or ramp for entrance;Direct supervision/assist for medications management;Direct supervision/assist for financial management   Can travel by private vehicle        Equipment Recommendations  None recommended by PT    Recommendations for Other Services       Precautions / Restrictions Precautions Precautions: Fall Recall of Precautions/Restrictions: Intact Restrictions Weight Bearing Restrictions Per Provider Order: No     Mobility  Bed Mobility Overal bed mobility: Independent             General bed mobility comments: BP in sitting 43/27 (33)    Transfers                   General transfer comment: deferred    Ambulation/Gait                   Stairs             Wheelchair Mobility     Tilt  Bed    Modified Rankin (Stroke Patients Only)       Balance Overall balance assessment: Mild deficits observed, not formally tested                                          Communication Communication Communication: No apparent difficulties  Cognition Arousal: Alert Behavior During Therapy: WFL for tasks assessed/performed   PT - Cognitive impairments: No apparent impairments                         Following commands: Intact      Cueing    Exercises      General Comments        Pertinent Vitals/Pain Pain Assessment Pain Assessment: No/denies pain    Home Living                          Prior Function            PT Goals (current goals can now be found in the care plan section) Acute Rehab PT Goals PT Goal Formulation: With patient Time For Goal Achievement: 04/30/24 Potential to Achieve Goals: Fair Progress towards PT goals: Progressing toward goals    Frequency    Min 1X/week  PT Plan      Co-evaluation              AM-PAC PT 6 Clicks Mobility   Outcome Measure  Help needed turning from your back to your side while in a flat bed without using bedrails?: None Help needed moving from lying on your back to sitting on the side of a flat bed without using bedrails?: None Help needed moving to and from a bed to a chair (including a wheelchair)?: None Help needed standing up from a chair using your arms (e.g., wheelchair or bedside chair)?: None Help needed to walk in hospital room?: A Little Help needed climbing 3-5 steps with a railing? : A Little 6 Click Score: 22    End of Session   Activity Tolerance: Treatment limited secondary to medical complications (Comment) Patient left: in bed;with call bell/phone within reach Nurse Communication: Mobility status PT Visit Diagnosis: Muscle weakness (generalized) (M62.81);History of falling (Z91.81)     Time: 8496-8467 PT Time Calculation (min)  (ACUTE ONLY): 29 min  Charges:    $Therapeutic Activity: 23-37 mins PT General Charges $$ ACUTE PT VISIT: 1 Visit                     Maryanne Finder, PT, DPT Physical Therapist - [facility]  [facility]    Yehudit Fulginiti A Samah Lapiana 04/24/2024, 4:01 PM

## 2024-04-24 NOTE — Progress Notes (Signed)
   04/24/24 1231  Vitals  Temp 97.8 F (36.6 C)  BP 94/64  Pulse Rate 79  Resp 18  Type of Weight Post-Dialysis  Oxygen Therapy  SpO2 100 %  O2 Device Room Air  Patient Activity (if Appropriate) In bed  Pulse Oximetry Type Continuous  Oximetry Probe Site Changed No  Post Treatment  Dialyzer Clearance Lightly streaked  Fluid Removed (mL) 0 mL  Tolerated HD Treatment Yes  Post-Hemodialysis Comments Pt with poor functional catheter. Catheter unable to maintain prescribed blood flow. Cathflo to be instilled and removed next treatment for improved function

## 2024-04-24 NOTE — Progress Notes (Signed)
 PROGRESS NOTE    Brady Edwards  FMW:[id number] DOB: [birth year] DOA: 04/15/2024 PCP: Vicci Duwaine SQUIBB, DO  147A/147A-AA  LOS: 5 days   Brief hospital course:   Brady Edwards is a 58 y.o. male with medical history significant of newly ESRD recently started on HD, atrial fibrillation, anxiety and depression, morbid obesity who presented with complaints of weakness and falls.   Pt was very frustrated with his medical and mental problems, and wanted us  to make him better.  Pt complained of fatigue, lack of energy, and weakness, causing to fall several times since he was discharged from hospital on 04/13/24.  No focal neuro deficits.  Pt said he has had long standing mental health issues and needs to see psych.  Pt complained of constipation for the past 2 months that wasn't addressed during his last hospitalization.     Of note, pt was hospitalized from 04/07/24 to 04/13/24 during which he was started on dialysis, and was discharged after dialysis on 8/15 to continue outpatient dialysis TTS.   ED Course: initial vitals: afebrile, pulse 105, BP 86/65, RR 12, sating 99% on room air.  Labs consistent with ESRD.  CXR no acute finding.  EKG showed sinus tachycardia.  CT head no acute finding.  ED provider reported pt said he would be better off not living at this point but does not have a plan for suicide and is going to receive help.  Psych consult placed.  ED provider contacted oncall nephro, who plans dialysis for pt tomorrow.  Assessment & Plan:  Brady Edwards is a 58 y.o. male with medical history significant of recently diagnosed ESRD now on HD, atrial fibrillation, anxiety and depression, morbid obesity who presented with complaints of weakness and falls.    # Orthostatic Hypotension Persistent, symptomatic when going from seated to standing position. No concern for infection. No known neurological conditions that could be contributing. AM cortisol WNL though drawn at 0500.  -- Continue  to hold home Toprol  due to low BP --Continue midodrine  10 mg TID --TED/compression hose abd binder  -- Increase florinef  0.2mg  every day --Fluid intake liberalized per nephro -- Florinef  and midodrine  remain ineffective need to consider alternative agent  # Weakness and fall improving --Fatigue and weakness have been progressive, due to recent hospitalization and decline in health.  CT head wnl, no acute finding. Noted to have elevated BUN (unclear of time line regarding rise in BUN). Also noted to have significant orthostatic hypotension. Suspect this is likely cause of fall.  --PT/OT recommending SNF   # ESRD on HD TTS Secondary hyperparathyroidism Calcitriol  deficiency  --recently started on dialysis --iHD per nephro   #Normocytic anemia, mild  Likely anemia of chronic disease  Stable.  Iron panel within normal limits.  B12 within normal limits.  Folate within normal limits -ESA per nephrology     Latest Ref Rng & Units 04/24/2024    9:15 AM 04/23/2024    4:22 AM 04/22/2024    5:21 AM  CBC  WBC 4.0 - 10.5 K/uL 5.5  10.1  10.2   Hemoglobin 13.0 - 17.0 g/dL 89.3  88.6  88.3   Hematocrit 39.0 - 52.0 % 32.4  35.6  35.3   Platelets 150 - 400 K/uL 196  236  232     # Hx of Afib --pt not taking anticoagulation PTA.  Currently in sinus. --hold home Toprol  due to low BP   # Depression and hx of panic disorder # Passive  SI --psych consulted --no need for SI precaution, per psych --cont home bupropion  and Effexor    # OSA --CPAP nightly   # Obesity, Class III, BMI 40-49.9 (morbid obesity)    DVT prophylaxis: Heparin  SQ Code Status: Full code  Family Communication:  Level of care: Med-Surg Dispo:   The patient is from: home Anticipated d/c is to: SNF rehab, patient is currently unhomed as he was evicted recently. TOC consulted. Patient remains hospitalized given persistent severe orthostatic hypotension  Anticipated d/c date is: whenever SNF accepts    Subjective and  Interval History:  No issues overnight remains significantly orthostatic with standing.  Objective: Vitals:   04/24/24 1131 04/24/24 1231 04/24/24 1313 04/24/24 1530  BP: (!) 86/56 94/64 (!) 84/58 97/80  Pulse: 78 79 94 97  Resp: 18 18 20    Temp:  97.8 F (36.6 C) 98 F (36.7 C)   TempSrc:   Oral   SpO2:  100% 96%   Weight:      Height:        Intake/Output Summary (Last 24 hours) at 04/24/2024 1750 Last data filed at 04/24/2024 1231 Gross per 24 hour  Intake 240 ml  Output 50 ml  Net 190 ml   Filed Weights   04/21/24 1128 04/24/24 0830 04/24/24 0831  Weight: 119 kg 117.9 kg 117.9 kg    Examination:    Constitutional: In no distress.  Cardiovascular: Normal rate, regular rhythm. No lower extremity edema  Pulmonary: Non labored breathing on room air, no wheezing or rales.   Abdominal: Soft. Non distended and non tender Musculoskeletal: Normal range of motion.     Neurological: Alert and oriented to person, place, and time. Non focal  Skin: Skin is warm and dry.     Data Reviewed: I have personally reviewed labs and imaging studies    Time spent: 35 minutes  Alban Pepper, MD [facility] If 7PM-7AM, please contact night-coverage 04/24/2024, 5:50 PM

## 2024-04-25 ENCOUNTER — Telehealth: Payer: Self-pay | Admitting: *Deleted

## 2024-04-25 DIAGNOSIS — R531 Weakness: Secondary | ICD-10-CM | POA: Diagnosis not present

## 2024-04-25 LAB — VITAMIN D 25 HYDROXY (VIT D DEFICIENCY, FRACTURES): Vit D, 25-Hydroxy: 16.11 ng/mL — ABNORMAL LOW (ref 30–100)

## 2024-04-25 MED ORDER — HEPARIN SODIUM (PORCINE) 1000 UNIT/ML IJ SOLN
INTRAMUSCULAR | Status: AC
  Filled 2024-04-25: qty 5

## 2024-04-25 MED ORDER — MIDODRINE HCL 5 MG PO TABS
ORAL_TABLET | ORAL | Status: AC
  Filled 2024-04-25: qty 2

## 2024-04-25 MED ORDER — MIDODRINE HCL 5 MG PO TABS
10.0000 mg | ORAL_TABLET | Freq: Once | ORAL | Status: AC
  Administered 2024-04-25: 10 mg via ORAL

## 2024-04-25 NOTE — Progress Notes (Signed)
 [facility]  ROUNDING NOTE   Subjective:   Patient seen and evaluated during dialysis   HEMODIALYSIS FLOWSHEET:  Blood Flow Rate (mL/min): 400 mL/min Arterial Pressure (mmHg): 0 mmHg Venous Pressure (mmHg): 0 mmHg TMP (mmHg): -3.63 mmHg Ultrafiltration Rate (mL/min): 0 mL/min Dialysate Flow Rate (mL/min): 300 ml/min Dialysis Fluid Bolus: Albumin   Receiving short treatment today since access malfunctioned yesterday   Objective:  Vital signs in last 24 hours:  Temp:  [97.8 F (36.6 C)-98.7 F (37.1 C)] 97.9 F (36.6 C) (08/27 0922) Pulse Rate:  [68-97] 72 (08/27 0930) Resp:  [12-20] 18 (08/27 0930) BP: (74-106)/(51-80) 74/57 (08/27 0930) SpO2:  [96 %-100 %] 97 % (08/27 0930) Weight:  [120.2 kg] 120.2 kg (08/27 0922)  Weight change:  Filed Weights   04/24/24 0830 04/24/24 0831 04/25/24 0922  Weight: 117.9 kg 117.9 kg 120.2 kg    Intake/Output: I/O last 3 completed shifts: In: -  Out: 50 [Urine:50]   Intake/Output this shift:  No intake/output data recorded.  Physical Exam: General: NAD, missing teeth, large habitus  Head: Normocephalic  Eyes: Anicteric  Lungs:  Clear, room air  Heart: Regular rate  Abdomen:  Soft, distended   Extremities:  None peripheral edema.  Neurologic: Alert, awake  Skin: No lesions  Access: Rt chest permcath    Basic Metabolic Panel: Recent Labs  Lab 04/19/24 0746 04/21/24 0745 04/23/24 0422 04/24/24 0915  NA 139 136 135 135  K 3.8 3.5 3.6 3.7  CL 99 96* 92* 95*  CO2 21* 21* 25 24  GLUCOSE 100* 98 106* 91  BUN 47* 43* 44* 44*  CREATININE 10.83* 11.36* 11.74* 11.49*  CALCIUM  9.0 9.0 10.1 9.3  PHOS  --  7.6* 7.4* 5.8*    Liver Function Tests: Recent Labs  Lab 04/21/24 0745 04/23/24 0422 04/24/24 0915  ALBUMIN  3.9 4.4 3.7   No results for input(s): LIPASE, AMYLASE in the last 168 hours. No results for input(s): AMMONIA in the last 168 hours.   CBC: Recent Labs  Lab 04/20/24 0513  04/21/24 0414 04/22/24 0521 04/23/24 0422 04/24/24 0915  WBC 9.8 11.6* 10.2 10.1 5.5  NEUTROABS  --   --   --   --  2.6  HGB 11.8* 12.4* 11.6* 11.3* 10.6*  HCT 36.1* 37.6* 35.3* 35.6* 32.4*  MCV 92.8 92.6 93.4 95.7 94.5  PLT 232 261 232 236 196    Cardiac Enzymes: No results for input(s): CKTOTAL, CKMB, CKMBINDEX, TROPONINI in the last 168 hours.  BNP: Invalid input(s): POCBNP  CBG: No results for input(s): GLUCAP in the last 168 hours.  Microbiology: Results for orders placed or performed during the hospital encounter of 04/07/24  Resp panel by RT-PCR (RSV, Flu A&B, Covid) Anterior Nasal Swab     Status: None   Collection Time: 04/07/24  4:46 PM   Specimen: Anterior Nasal Swab  Result Value Ref Range Status   SARS Coronavirus 2 by RT PCR NEGATIVE NEGATIVE Final    Comment: (NOTE) SARS-CoV-2 target nucleic acids are NOT DETECTED.  The SARS-CoV-2 RNA is generally detectable in upper respiratory specimens during the acute phase of infection. The lowest concentration of SARS-CoV-2 viral copies this assay can detect is 138 copies/mL. A negative result does not preclude SARS-Cov-2 infection and should not be used as the sole basis for treatment or other patient management decisions. A negative result may occur with  improper specimen collection/handling, submission of specimen other than nasopharyngeal swab, presence of viral mutation(s) within the areas targeted  by this assay, and inadequate number of viral copies(<138 copies/mL). A negative result must be combined with clinical observations, patient history, and epidemiological information. The expected result is Negative.  Fact Sheet for Patients:  BloggerCourse.com  Fact Sheet for Healthcare Providers:  SeriousBroker.it  This test is no t yet approved or cleared by the [place]  FDA and  has been authorized for detection and/or diagnosis of SARS-CoV-2  by FDA under an Emergency Use Authorization (EUA). This EUA will remain  in effect (meaning this test can be used) for the duration of the COVID-19 declaration under Section 564(b)(1) of the Act, 21 U.S.C.section 360bbb-3(b)(1), unless the authorization is terminated  or revoked sooner.       Influenza A by PCR NEGATIVE NEGATIVE Final   Influenza B by PCR NEGATIVE NEGATIVE Final    Comment: (NOTE) The Xpert Xpress SARS-CoV-2/FLU/RSV plus assay is intended as an aid in the diagnosis of influenza from Nasopharyngeal swab specimens and should not be used as a sole basis for treatment. Nasal washings and aspirates are unacceptable for Xpert Xpress SARS-CoV-2/FLU/RSV testing.  Fact Sheet for Patients: BloggerCourse.com  Fact Sheet for Healthcare Providers: SeriousBroker.it  This test is not yet approved or cleared by the [place]  FDA and has been authorized for detection and/or diagnosis of SARS-CoV-2 by FDA under an Emergency Use Authorization (EUA). This EUA will remain in effect (meaning this test can be used) for the duration of the COVID-19 declaration under Section 564(b)(1) of the Act, 21 U.S.C. section 360bbb-3(b)(1), unless the authorization is terminated or revoked.     Resp Syncytial Virus by PCR NEGATIVE NEGATIVE Final    Comment: (NOTE) Fact Sheet for Patients: BloggerCourse.com  Fact Sheet for Healthcare Providers: SeriousBroker.it  This test is not yet approved or cleared by the [place]  FDA and has been authorized for detection and/or diagnosis of SARS-CoV-2 by FDA under an Emergency Use Authorization (EUA). This EUA will remain in effect (meaning this test can be used) for the duration of the COVID-19 declaration under Section 564(b)(1) of the Act, 21 U.S.C. section 360bbb-3(b)(1), unless the authorization is terminated or revoked.  Performed at  [facility], [address] [place], [place] [address]     Coagulation Studies: No results for input(s): LABPROT, INR in the last 72 hours.  Urinalysis: No results for input(s): COLORURINE, LABSPEC, PHURINE, GLUCOSEU, HGBUR, BILIRUBINUR, KETONESUR, PROTEINUR, UROBILINOGEN, NITRITE, LEUKOCYTESUR in the last 72 hours.  Invalid input(s): APPERANCEUR    Imaging: No results found.   Medications:     buPROPion   300 mg Oral Daily   calcitRIOL   0.25 mcg Oral Daily   Chlorhexidine  Gluconate Cloth  6 each Topical Daily   feeding supplement  237 mL Oral TID BM   fludrocortisone   0.2 mg Oral Daily   heparin   5,000 Units Subcutaneous Q8H   midodrine   10 mg Oral TID WC   sevelamer  carbonate  1,600 mg Oral TID WC   venlafaxine  XR  150 mg Oral Q breakfast   And   venlafaxine  XR  75 mg Oral Q breakfast   acetaminophen , ondansetron  (ZOFRAN ) IV, ondansetron   Assessment/ Plan:  Brady Edwards is a 58 y.o.  male  with a PMHx of hypertension, atrial fibrillation, BPH, hyperlipidemia, anxiety, constipation, morbid obesity, and end stage renal disease on hemodialysis.    [facility] [place]/TTS/Rt permcath   End stage renal disease on hemodialysis             Received short treatment  yesterday due to access malfunctions. Received cathflo overnight and will attempt short treatment today. IF access does not function well, will consult vascular for exchange.    2.  Acute metabolic acidosis.  Serum bicarbonate 16 on admission. Correcting with dialysis   3.  Anemia of chronic kidney disease.  Hemoglobin 10.6. No need for ESA during this admission   4.  Secondary hyperparathyroidism. PTH 199 during recent admission. Continue Renvela  2 tabs TID. Phos improving     LOS: 6 Davaris Youtsey 8/27/20259:50 AM

## 2024-04-25 NOTE — TOC Progression Note (Signed)
 Transition of Care [facility]) - Progression Note    Patient Details  Name: SERIGNE KUBICEK MRN: [id number] Date of Birth: 09/06/1965  [facility]) CM/SW Contact  Alvaro Louder, [place] Phone Number: 04/25/2024, 4:25 PM  Clinical Narrative:   LCSWA met with patient at bedside to discuss bed search. Bed search yielded no results as no SNF will accept the patient. Patient indicated that he had to sell his Mobile home to his landlord to pay off his missing Payments. LCSWA indicated that he will look at [facility] in the area around his Dialysis center. Patient was agreeable.   TOC to follow for discharge                       Expected Discharge Plan and Services                                               Social Drivers of Health (SDOH) Interventions SDOH Screenings   Food Insecurity: Food Insecurity Present (04/16/2024)  Housing: High Risk (04/16/2024)  Transportation Needs: Unmet Transportation Needs (04/16/2024)  Utilities: At Risk (04/16/2024)  Depression (PHQ2-9): High Risk (02/27/2024)  Tobacco Use: High Risk (04/07/2024)    Readmission Risk Interventions     No data to display

## 2024-04-25 NOTE — Progress Notes (Signed)
 PROGRESS NOTE    Brady Edwards  FMW:[id number] DOB: Jun 02, 1966 DOA: 04/15/2024 PCP: Vicci Duwaine SQUIBB, DO  147A/147A-AA  LOS: 6 days   Brief hospital course:   Brady Edwards is a 58 y.o. male with medical history significant of newly ESRD recently started on HD, atrial fibrillation, anxiety and depression, morbid obesity who presented with complaints of weakness and falls.   Pt was very frustrated with his medical and mental problems, and wanted us  to make him better.  Pt complained of fatigue, lack of energy, and weakness, causing to fall several times since he was discharged from hospital on 04/13/24.  No focal neuro deficits.  Pt said he has had long standing mental health issues and needs to see psych.  Pt complained of constipation for the past 2 months that wasn't addressed during his last hospitalization.     Of note, pt was hospitalized from 04/07/24 to 04/13/24 during which he was started on dialysis, and was discharged after dialysis on 8/15 to continue outpatient dialysis TTS.   ED Course: initial vitals: afebrile, pulse 105, BP 86/65, RR 12, sating 99% on room air.  Labs consistent with ESRD.  CXR no acute finding.  EKG showed sinus tachycardia.  CT head no acute finding.  ED provider reported pt said he would be better off not living at this point but does not have a plan for suicide and is going to receive help.  Psych consult placed.  ED provider contacted oncall nephro, who plans dialysis for pt tomorrow.  Assessment & Plan:  Brady Edwards is a 58 y.o. male with medical history significant of recently diagnosed ESRD now on HD, atrial fibrillation, anxiety and depression, morbid obesity who presented with complaints of weakness and falls.    # Orthostatic Hypotension Persistent, symptomatic when going from seated to standing position. No concern for infection. No known neurological conditions that could be contributing. AM cortisol WNL though drawn at 0500.  -- Continue  to hold home Toprol  due to low BP --Continue midodrine  10 mg TID --TED/compression hose abd binder  -- Increase florinef  0.2mg  every day --Fluid intake liberalized per nephro -- Florinef  and midodrine  remain ineffective need to consider alternative agent  # Weakness and fall improving --Fatigue and weakness have been progressive, due to recent hospitalization and decline in health.  CT head wnl, no acute finding. Noted to have elevated BUN (unclear of time line regarding rise in BUN). Also noted to have significant orthostatic hypotension. Suspect this is likely cause of fall.  --PT/OT recommending SNF 8/27 check autoantibodies to rule out any autoimmune disorder.   # ESRD on HD TTS Secondary hyperparathyroidism Calcitriol  deficiency  --recently started on dialysis --iHD per nephro   # Normocytic anemia, mild  Likely anemia of chronic disease  Stable.  Iron panel within normal limits.  B12 within normal limits.  Folate within normal limits -ESA per nephrology     Latest Ref Rng & Units 04/24/2024    9:15 AM 04/23/2024    4:22 AM 04/22/2024    5:21 AM  CBC  WBC 4.0 - 10.5 K/uL 5.5  10.1  10.2   Hemoglobin 13.0 - 17.0 g/dL 89.3  88.6  88.3   Hematocrit 39.0 - 52.0 % 32.4  35.6  35.3   Platelets 150 - 400 K/uL 196  236  232     # Hx of Afib --pt not taking anticoagulation PTA.  Currently in sinus. --hold home Toprol  due to low BP   #  Depression and hx of panic disorder # Passive SI --psych consulted --no need for SI precaution, per psych --cont home bupropion  and Effexor    # OSA --CPAP nightly   # Obesity, Class III, BMI 40-49.9 (morbid obesity)    DVT prophylaxis: Heparin  SQ Code Status: Full code  Family Communication:  Level of care: Med-Surg Dispo:   The patient is from: home Anticipated d/c is to: SNF rehab, patient is currently unhomed as he was evicted recently. TOC consulted. Patient remains hospitalized given persistent severe orthostatic hypotension   Anticipated d/c date is: whenever SNF accepts    Subjective and Interval History:  Patient was seen and examined at bedside today. No any active issues overnight.  As per patient he is fine while laying but he cannot get up and walk due to orthostatic hypotension.   Objective: Vitals:   04/25/24 1130 04/25/24 1200 04/25/24 1206 04/25/24 1451  BP: (!) 92/49 (!) 76/56 (!) 92/59 (!) 83/52  Pulse: 92 82 74 85  Resp: 11 14 14 17   Temp:   97.9 F (36.6 C) 98.5 F (36.9 C)  TempSrc:   Oral   SpO2: 100% 98% 97% 95%  Weight:   120.3 kg   Height:        Intake/Output Summary (Last 24 hours) at 04/25/2024 1543 Last data filed at 04/25/2024 1206 Gross per 24 hour  Intake --  Output 0 ml  Net 0 ml   Filed Weights   04/24/24 0831 04/25/24 0922 04/25/24 1206  Weight: 117.9 kg 120.2 kg 120.3 kg    Examination:    Constitutional: In no distress.  Cardiovascular: Normal rate, regular rhythm. No lower extremity edema  Pulmonary: Non labored breathing on room air, no wheezing or rales.   Abdominal: Soft. Non distended and non tender Musculoskeletal: Normal range of motion.     Neurological: Alert and oriented to person, place, and time. Non focal  Skin: Skin is warm and dry.     Data Reviewed: I have personally reviewed labs and imaging studies    Time spent: 35 minutes  Elvan Sor, MD [facility] If 7PM-7AM, please contact night-coverage 04/25/2024, 3:43 PM

## 2024-04-25 NOTE — Progress Notes (Signed)
 Hemodialysis Note:  Received patient in bed to unit. Alert and oriented. Informed consent singed and in chart.  Treatment initiated: 0930 Treatment completed: 1206  Access used: Right internal jugular catheter Access issues: None  300 ml saline bolus given due to hypotension. Transported back to room, alert without acute distress. Report given to patient's RN.  Total UF removed: 0 Medications given: Midodrine  10 mg Tablet  Post HD weight: 120.3 kg  [name]

## 2024-04-26 DIAGNOSIS — R531 Weakness: Secondary | ICD-10-CM | POA: Diagnosis not present

## 2024-04-26 LAB — CBC
HCT: 34.1 % — ABNORMAL LOW (ref 39.0–52.0)
Hemoglobin: 11.1 g/dL — ABNORMAL LOW (ref 13.0–17.0)
MCH: 31.3 pg (ref 26.0–34.0)
MCHC: 32.6 g/dL (ref 30.0–36.0)
MCV: 96.1 fL (ref 80.0–100.0)
Platelets: 200 K/uL (ref 150–400)
RBC: 3.55 MIL/uL — ABNORMAL LOW (ref 4.22–5.81)
RDW: 13.4 % (ref 11.5–15.5)
WBC: 9.8 K/uL (ref 4.0–10.5)
nRBC: 0 % (ref 0.0–0.2)

## 2024-04-26 LAB — BASIC METABOLIC PANEL WITH GFR
Anion gap: 12 (ref 5–15)
BUN: 22 mg/dL — ABNORMAL HIGH (ref 6–20)
CO2: 28 mmol/L (ref 22–32)
Calcium: 9.7 mg/dL (ref 8.9–10.3)
Chloride: 96 mmol/L — ABNORMAL LOW (ref 98–111)
Creatinine, Ser: 9.09 mg/dL — ABNORMAL HIGH (ref 0.61–1.24)
GFR, Estimated: 6 mL/min — ABNORMAL LOW (ref >60)
Glucose, Bld: 102 mg/dL — ABNORMAL HIGH (ref 70–99)
Potassium: 4 mmol/L (ref 3.5–5.1)
Sodium: 136 mmol/L (ref 135–145)

## 2024-04-26 LAB — GLUCOSE, CAPILLARY
Glucose-Capillary: 131 mg/dL — ABNORMAL HIGH (ref 70–99)
Glucose-Capillary: 148 mg/dL — ABNORMAL HIGH (ref 70–99)

## 2024-04-26 LAB — ACETYLCHOLINE RECEPTOR, BINDING: Acety choline binding ab: 0.14 nmol/L (ref 0.00–0.24)

## 2024-04-26 LAB — PHOSPHORUS: Phosphorus: 4 mg/dL (ref 2.5–4.6)

## 2024-04-26 LAB — MAGNESIUM: Magnesium: 2.3 mg/dL (ref 1.7–2.4)

## 2024-04-26 MED ORDER — MIDODRINE HCL 5 MG PO TABS
ORAL_TABLET | ORAL | Status: AC
Start: 2024-04-26 — End: 2024-04-26
  Filled 2024-04-26: qty 2

## 2024-04-26 MED ORDER — INSULIN ASPART 100 UNIT/ML IJ SOLN
0.0000 [IU] | Freq: Three times a day (TID) | INTRAMUSCULAR | Status: DC
  Administered 2024-04-26 – 2024-04-27 (×2): 2 [IU] via SUBCUTANEOUS
  Administered 2024-04-27 – 2024-05-03 (×3): 3 [IU] via SUBCUTANEOUS
  Administered 2024-05-10: 2 [IU] via SUBCUTANEOUS
  Filled 2024-04-26 (×7): qty 1

## 2024-04-26 MED ORDER — POLYETHYLENE GLYCOL 3350 17 G PO PACK
17.0000 g | PACK | Freq: Two times a day (BID) | ORAL | Status: DC
  Administered 2024-04-26 – 2024-05-20 (×10): 17 g via ORAL
  Filled 2024-04-26 (×33): qty 1

## 2024-04-26 MED ORDER — HEPARIN SODIUM (PORCINE) 1000 UNIT/ML IJ SOLN
INTRAMUSCULAR | Status: AC
  Filled 2024-04-26: qty 5

## 2024-04-26 MED ORDER — METHYLPREDNISOLONE SODIUM SUCC 125 MG IJ SOLR
125.0000 mg | Freq: Every day | INTRAMUSCULAR | Status: AC
  Administered 2024-04-26 – 2024-04-28 (×3): 125 mg via INTRAVENOUS
  Filled 2024-04-26 (×3): qty 2

## 2024-04-26 MED ORDER — PANTOPRAZOLE SODIUM 40 MG PO TBEC
40.0000 mg | DELAYED_RELEASE_TABLET | Freq: Every day | ORAL | Status: AC
  Administered 2024-04-26 – 2024-05-02 (×6): 40 mg via ORAL
  Filled 2024-04-26 (×6): qty 1

## 2024-04-26 MED ORDER — BISACODYL 5 MG PO TBEC
10.0000 mg | DELAYED_RELEASE_TABLET | Freq: Every day | ORAL | Status: DC
  Administered 2024-04-27 – 2024-06-18 (×46): 10 mg via ORAL
  Filled 2024-04-26 (×51): qty 2

## 2024-04-26 MED ORDER — BISACODYL 10 MG RE SUPP: 10.0000 mg | Freq: Every day | RECTAL | Status: DC | PRN

## 2024-04-26 MED ORDER — BISACODYL 5 MG PO TBEC
10.0000 mg | DELAYED_RELEASE_TABLET | Freq: Once | ORAL | Status: AC
  Administered 2024-04-26: 10 mg via ORAL
  Filled 2024-04-26: qty 2

## 2024-04-26 NOTE — Progress Notes (Signed)
 PT Cancellation Note  Patient Details Name: JANI PLOEGER MRN: [id number] DOB: September 15, 1965   Cancelled Treatment:    Reason Eval/Treat Not Completed: Patient at procedure or test/unavailable.  Pt currently off unit at dialysis.  Will re-attempt PT session at a later date/time  Damien Caulk, PT 04/26/24, 11:35 AM

## 2024-04-26 NOTE — Progress Notes (Addendum)
 PROGRESS NOTE    Brady Edwards  FMW:[id number] DOB: Jul 08, 1966 DOA: 04/15/2024 PCP: Vicci Duwaine SQUIBB, DO  [id number]/[id number]-AA  LOS: 7 days   Brief hospital course:   Brady Edwards is a 58 y.o. male with medical history significant of newly ESRD recently started on HD, atrial fibrillation, anxiety and depression, morbid obesity who presented with complaints of weakness and falls.   Pt was very frustrated with his medical and mental problems, and wanted us  to make him better.  Pt complained of fatigue, lack of energy, and weakness, causing to fall several times since he was discharged from hospital on 04/13/24.  No focal neuro deficits.  Pt said he has had long standing mental health issues and needs to see psych.  Pt complained of constipation for the past 2 months that wasn't addressed during his last hospitalization.     Of note, pt was hospitalized from 04/07/24 to 04/13/24 during which he was started on dialysis, and was discharged after dialysis on 8/15 to continue outpatient dialysis TTS.   ED Course: initial vitals: afebrile, pulse 105, BP 86/65, RR 12, sating 99% on room air.  Labs consistent with ESRD.  CXR no acute finding.  EKG showed sinus tachycardia.  CT head no acute finding.  ED provider reported pt said he would be better off not living at this point but does not have a plan for suicide and is going to receive help.  Psych consult placed.  ED provider contacted oncall nephro, who plans dialysis for pt tomorrow.  Assessment & Plan:  JATAVIUS Edwards is a 58 y.o. male with medical history significant of recently diagnosed ESRD now on HD, atrial fibrillation, anxiety and depression, morbid obesity who presented with complaints of weakness and falls.    # Orthostatic Hypotension Persistent, symptomatic when going from seated to standing position. No concern for infection. No known neurological conditions that could be contributing. AM cortisol WNL though drawn at 0500.  -- Continue  to hold home Toprol  due to low BP --Continue midodrine  10 mg TID --TED/compression hose abd binder  -- Increase florinef  0.2mg  every day --Fluid intake liberalized per nephro -- Florinef  and midodrine  remain ineffective need to consider alternative agent 8/28 stared steroids, Solu-Medrol  125 mg IV daily for 3 days, PPI for GI prophylaxis and insulin  sliding scale monitor CBG for hyperglycemia   # Weakness and fall  --Fatigue and weakness have been progressive, due to recent hospitalization and decline in health.  CT head wnl, no acute finding. Noted to have elevated BUN (unclear of time line regarding rise in BUN). Also noted to have significant orthostatic hypotension. Suspect this is likely cause of fall.  --PT/OT recommending SNF 8/27 check autoantibodies to rule out any autoimmune disorder.    # ESRD on HD TTS Secondary hyperparathyroidism Calcitriol  deficiency  --recently started on dialysis --HD per nephro   # Normocytic anemia, mild  Likely anemia of chronic disease  Stable.  Iron panel within normal limits.  B12 within normal limits.  Folate within normal limits -ESA per nephrology     Latest Ref Rng & Units 04/26/2024    3:53 AM 04/24/2024    9:15 AM 04/23/2024    4:22 AM  CBC  WBC 4.0 - 10.5 K/uL 9.8  5.5  10.1   Hemoglobin 13.0 - 17.0 g/dL 88.8  89.3  88.6   Hematocrit 39.0 - 52.0 % 34.1  32.4  35.6   Platelets 150 - 400 K/uL 200  196  236     #  Hx of Afib --pt not taking anticoagulation PTA.  Currently in sinus. --hold home Toprol  due to low BP   # Depression and hx of panic disorder # Passive SI --psych consulted --no need for SI precaution, per psych --cont home bupropion  and Effexor   # Vitamin D  deficiency: Continue Calcitrol    # OSA --CPAP nightly   # Obesity, Class III, BMI 40-49.9 (morbid obesity)    DVT prophylaxis: Heparin  SQ Code Status: Full code  Family Communication:  Level of care: Med-Surg Dispo:   The patient is from:  home Anticipated d/c is to: SNF rehab, patient is currently unhomed as he was evicted recently. TOC consulted. Patient remains hospitalized given persistent severe orthostatic hypotension  Anticipated d/c date is: whenever SNF accepts    Subjective and Interval History:  Patient was seen and examined at bedside today. No any active issues overnight.  Patient was seen after hemodialysis. As per patient she still feels dizzy and unable to sit at the bedside age and unable to walk. Patient cannot go to any shelter if it is not ambulatory.   Objective: Vitals:   04/26/24 1100 04/26/24 1132 04/26/24 1133 04/26/24 1147  BP: 91/65 (!) 98/58 (!) 102/58 (!) 101/47  Pulse: 92 80 81 85  Resp:      Temp:   (!) 97.5 F (36.4 C) 98.7 F (37.1 C)  TempSrc:    Oral  SpO2: 100% 100% 98% 100%  Weight:   118.9 kg   Height:        Intake/Output Summary (Last 24 hours) at 04/26/2024 1521 Last data filed at 04/26/2024 1300 Gross per 24 hour  Intake 360 ml  Output 0 ml  Net 360 ml   Filed Weights   04/25/24 1206 04/26/24 0801 04/26/24 1133  Weight: 120.3 kg 118.8 kg 118.9 kg    Examination:    Constitutional: In no distress.  Cardiovascular: Normal rate, regular rhythm. No lower extremity edema  Pulmonary: Non labored breathing on room air, no wheezing or rales.   Abdominal: Soft. Non distended and non tender Musculoskeletal: Normal range of motion.     Neurological: Alert and oriented to person, place, and time. Non focal  Skin: Skin is warm and dry.     Data Reviewed: I have personally reviewed labs and imaging studies    Time spent: 35 minutes  Elvan Sor, MD [facility] If 7PM-7AM, please contact night-coverage 04/26/2024, 3:21 PM

## 2024-04-26 NOTE — Progress Notes (Signed)
 Due to patients length of stay and not officially starting his outpt dialysis clinic. [facility] Admissions contacted today and stated they are unable to hold pts chair time.  Would need to resubmit referral for outpt clinic once we have a D/C date.  Suzen Satchel Dialysis Navigator [phone]

## 2024-04-26 NOTE — Progress Notes (Signed)
 Referral received from hospitalist. Introduced patient to role of nurse navigator. Intake questions completed.  Patient endorses being homeless. Patient states he was previously living in a trailer on a rented lot. Patient fell significantly behind on rental payments and was evicted from the lot. Patient decided to sell trailer to pay off money owed to landlord. Per patient zero balance now exists.   Patient would like assistance with completing housing authority applications--aware this is NOT an immediate fix for housing situation.   Patient interested in assistance navigating SSDI online environment--will provide.   Encouraged to call with questions or concerns.

## 2024-04-26 NOTE — Progress Notes (Signed)
   04/26/24 1147  Vitals  Temp 98.7 F (37.1 C)  Temp Source Oral  BP (!) 101/47  BP Location Right Arm  BP Method Automatic  Pulse Rate 85  ECG Heart Rate 86  Oxygen Therapy  SpO2 100 %  During Treatment Monitoring  Blood Flow Rate (mL/min) 0 mL/min  Arterial Pressure (mmHg) -2.63 mmHg  Venous Pressure (mmHg) -1.82 mmHg  TMP (mmHg) 14.54 mmHg  Ultrafiltration Rate (mL/min) 243 mL/min  Dialysate Flow Rate (mL/min) 300 ml/min  Duration of HD Treatment -hour(s) 3.25 hour(s)  Cumulative Fluid Removed (mL) per Treatment  0.04  Post Treatment  Dialyzer Clearance Clear  Liters Processed 67.8  Fluid Removed (mL) 0 mL  Tolerated HD Treatment Yes  Post-Hemodialysis Comments tx has been tolerated. Vs have remained stable. Patient has no complaints of hypotension. Patient is stable for discharge.

## 2024-04-26 NOTE — Progress Notes (Addendum)
 [facility]  ROUNDING NOTE   Subjective:   Patient seen and evaluated during dialysis   HEMODIALYSIS FLOWSHEET:  Blood Flow Rate (mL/min): 399 mL/min Arterial Pressure (mmHg): -164.03 mmHg Venous Pressure (mmHg): 158.38 mmHg TMP (mmHg): 4.44 mmHg Ultrafiltration Rate (mL/min): 246 mL/min Dialysate Flow Rate (mL/min): 299 ml/min Dialysis Fluid Bolus: Albumin  Bolus Amount (mL): 100 mL  Tolerating treatment well today   Objective:  Vital signs in last 24 hours:  Temp:  [97.6 F (36.4 C)-98.5 F (36.9 C)] 97.6 F (36.4 C) (08/28 0741) Pulse Rate:  [71-92] 79 (08/28 1000) Resp:  [11-22] 21 (08/28 1000) BP: (76-105)/(49-69) 99/69 (08/28 1000) SpO2:  [95 %-100 %] 100 % (08/28 1000) Weight:  [118.8 kg-120.3 kg] 118.8 kg (08/28 0801)  Weight change: 2.3 kg Filed Weights   04/25/24 0922 04/25/24 1206 04/26/24 0801  Weight: 120.2 kg 120.3 kg 118.8 kg    Intake/Output: I/O last 3 completed shifts: In: 240 [P.O.:240] Out: 0    Intake/Output this shift:  No intake/output data recorded.  Physical Exam: General: NAD, missing teeth, large habitus  Head: Normocephalic  Eyes: Anicteric  Lungs:  Clear, room air  Heart: Regular rate  Abdomen:  Soft, distended   Extremities:  None peripheral edema.  Neurologic: Alert, awake  Skin: No lesions  Access: Rt chest permcath    Basic Metabolic Panel: Recent Labs  Lab 04/21/24 0745 04/23/24 0422 04/24/24 0915 04/26/24 0353  NA 136 135 135 136  K 3.5 3.6 3.7 4.0  CL 96* 92* 95* 96*  CO2 21* 25 24 28   GLUCOSE 98 106* 91 102*  BUN 43* 44* 44* 22*  CREATININE 11.36* 11.74* 11.49* 9.09*  CALCIUM  9.0 10.1 9.3 9.7  MG  --   --   --  2.3  PHOS 7.6* 7.4* 5.8* 4.0    Liver Function Tests: Recent Labs  Lab 04/21/24 0745 04/23/24 0422 04/24/24 0915  ALBUMIN  3.9 4.4 3.7   No results for input(s): LIPASE, AMYLASE in the last 168 hours. No results for input(s): AMMONIA in the last 168  hours.   CBC: Recent Labs  Lab 04/21/24 0414 04/22/24 0521 04/23/24 0422 04/24/24 0915 04/26/24 0353  WBC 11.6* 10.2 10.1 5.5 9.8  NEUTROABS  --   --   --  2.6  --   HGB 12.4* 11.6* 11.3* 10.6* 11.1*  HCT 37.6* 35.3* 35.6* 32.4* 34.1*  MCV 92.6 93.4 95.7 94.5 96.1  PLT 261 232 236 196 200    Cardiac Enzymes: No results for input(s): CKTOTAL, CKMB, CKMBINDEX, TROPONINI in the last 168 hours.  BNP: Invalid input(s): POCBNP  CBG: No results for input(s): GLUCAP in the last 168 hours.  Microbiology: Results for orders placed or performed during the hospital encounter of 04/07/24  Resp panel by RT-PCR (RSV, Flu A&B, Covid) Anterior Nasal Swab     Status: None   Collection Time: 04/07/24  4:46 PM   Specimen: Anterior Nasal Swab  Result Value Ref Range Status   SARS Coronavirus 2 by RT PCR NEGATIVE NEGATIVE Final    Comment: (NOTE) SARS-CoV-2 target nucleic acids are NOT DETECTED.  The SARS-CoV-2 RNA is generally detectable in upper respiratory specimens during the acute phase of infection. The lowest concentration of SARS-CoV-2 viral copies this assay can detect is 138 copies/mL. A negative result does not preclude SARS-Cov-2 infection and should not be used as the sole basis for treatment or other patient management decisions. A negative result may occur with  improper specimen collection/handling, submission of  specimen other than nasopharyngeal swab, presence of viral mutation(s) within the areas targeted by this assay, and inadequate number of viral copies(<138 copies/mL). A negative result must be combined with clinical observations, patient history, and epidemiological information. The expected result is Negative.  Fact Sheet for Patients:  BloggerCourse.com  Fact Sheet for Healthcare Providers:  SeriousBroker.it  This test is no t yet approved or cleared by the [place]  FDA and  has been  authorized for detection and/or diagnosis of SARS-CoV-2 by FDA under an Emergency Use Authorization (EUA). This EUA will remain  in effect (meaning this test can be used) for the duration of the COVID-19 declaration under Section 564(b)(1) of the Act, 21 U.S.C.section 360bbb-3(b)(1), unless the authorization is terminated  or revoked sooner.       Influenza A by PCR NEGATIVE NEGATIVE Final   Influenza B by PCR NEGATIVE NEGATIVE Final    Comment: (NOTE) The Xpert Xpress SARS-CoV-2/FLU/RSV plus assay is intended as an aid in the diagnosis of influenza from Nasopharyngeal swab specimens and should not be used as a sole basis for treatment. Nasal washings and aspirates are unacceptable for Xpert Xpress SARS-CoV-2/FLU/RSV testing.  Fact Sheet for Patients: BloggerCourse.com  Fact Sheet for Healthcare Providers: SeriousBroker.it  This test is not yet approved or cleared by the [place]  FDA and has been authorized for detection and/or diagnosis of SARS-CoV-2 by FDA under an Emergency Use Authorization (EUA). This EUA will remain in effect (meaning this test can be used) for the duration of the COVID-19 declaration under Section 564(b)(1) of the Act, 21 U.S.C. section 360bbb-3(b)(1), unless the authorization is terminated or revoked.     Resp Syncytial Virus by PCR NEGATIVE NEGATIVE Final    Comment: (NOTE) Fact Sheet for Patients: BloggerCourse.com  Fact Sheet for Healthcare Providers: SeriousBroker.it  This test is not yet approved or cleared by the [place]  FDA and has been authorized for detection and/or diagnosis of SARS-CoV-2 by FDA under an Emergency Use Authorization (EUA). This EUA will remain in effect (meaning this test can be used) for the duration of the COVID-19 declaration under Section 564(b)(1) of the Act, 21 U.S.C. section 360bbb-3(b)(1), unless the  authorization is terminated or revoked.  Performed at [facility], [address] [place], [place] [address]     Coagulation Studies: No results for input(s): LABPROT, INR in the last 72 hours.  Urinalysis: No results for input(s): COLORURINE, LABSPEC, PHURINE, GLUCOSEU, HGBUR, BILIRUBINUR, KETONESUR, PROTEINUR, UROBILINOGEN, NITRITE, LEUKOCYTESUR in the last 72 hours.  Invalid input(s): APPERANCEUR    Imaging: No results found.   Medications:     buPROPion   300 mg Oral Daily   calcitRIOL   0.25 mcg Oral Daily   Chlorhexidine  Gluconate Cloth  6 each Topical Daily   feeding supplement  237 mL Oral TID BM   fludrocortisone   0.2 mg Oral Daily   heparin   5,000 Units Subcutaneous Q8H   midodrine   10 mg Oral TID WC   sevelamer  carbonate  1,600 mg Oral TID WC   venlafaxine  XR  150 mg Oral Q breakfast   And   venlafaxine  XR  75 mg Oral Q breakfast   acetaminophen , ondansetron  (ZOFRAN ) IV, ondansetron   Assessment/ Plan:  Brady Edwards is a 58 y.o.  male  with a PMHx of hypertension, atrial fibrillation, BPH, hyperlipidemia, anxiety, constipation, morbid obesity, and end stage renal disease on hemodialysis.    [facility]/TTS/Rt permcath   End stage renal disease on hemodialysis  Receiving treatment today, no UF due to hypotension. Volume status stable. Next treatment scheduled for Saturday.    2.  Acute metabolic acidosis.  Serum bicarbonate 16 on admission. Corrected with dialysis   3.  Anemia of chronic kidney disease.  Hemoglobin 11.1 No need for ESA during this admission   4.  Secondary hyperparathyroidism. PTH 199 during recent admission. Continue Renvela  2 tabs TID.   Calcium  and phos within optimal range.  5. Severe orthostatic hypotension Workup so far has included 2D echo from 04/11/2024 which shows LVEF 65 to 70%, normal LV systolic function, mild concentric LVH, normal diastolic parameters,  normal right ventricular systolic function.  A.m. cortisol level normal from 04/24/2024.  TSH normal.  Other differential includes amyloidosis.  Kidney biopsy or fat pad biopsy can be considered.     LOS: 7 Shantelle Breeze 8/28/202510:27 AM  Patient was seen and examined with Faith Harris, NP.  Plan of care was formulated for the problems addressed and discussed with NP.  I agree with the note as documented except as noted below.

## 2024-04-27 LAB — GLUCOSE, CAPILLARY
Glucose-Capillary: 119 mg/dL — ABNORMAL HIGH (ref 70–99)
Glucose-Capillary: 157 mg/dL — ABNORMAL HIGH (ref 70–99)
Glucose-Capillary: 148 mg/dL — ABNORMAL HIGH (ref 70–99)
Glucose-Capillary: 130 mg/dL — ABNORMAL HIGH (ref 70–99)

## 2024-04-27 NOTE — Plan of Care (Signed)

## 2024-04-27 NOTE — Progress Notes (Signed)
 [facility]  ROUNDING NOTE   Subjective:   Patient seen laying in bed Alert and oriented Denies pain Voices frustration with discharge planning  Seen later in morning making calls to his insurance company   Objective:  Vital signs in last 24 hours:  Temp:  [97.9 F (36.6 C)-98.7 F (37.1 C)] 98.2 F (36.8 C) (08/29 0800) Pulse Rate:  [77-86] 77 (08/29 0800) Resp:  [18-20] 20 (08/29 0800) BP: (90-101)/(47-64) 90/64 (08/29 0800) SpO2:  [94 %-100 %] 99 % (08/29 0800)  Weight change: -1.4 kg Filed Weights   04/25/24 1206 04/26/24 0801 04/26/24 1133  Weight: 120.3 kg 118.8 kg 118.9 kg    Intake/Output: I/O last 3 completed shifts: In: 120 [P.O.:120] Out: 300 [Urine:300]   Intake/Output this shift:  Total I/O In: 250 [P.O.:250] Out: -   Physical Exam: General: NAD, missing teeth, large habitus  Head: Normocephalic  Eyes: Anicteric  Lungs:  Clear, room air  Heart: Regular rate  Abdomen:  Soft, distended   Extremities:  None peripheral edema.  Neurologic: Alert, awake  Skin: No lesions  Access: Rt chest permcath    Basic Metabolic Panel: Recent Labs  Lab 04/21/24 0745 04/23/24 0422 04/24/24 0915 04/26/24 0353  NA 136 135 135 136  K 3.5 3.6 3.7 4.0  CL 96* 92* 95* 96*  CO2 21* 25 24 28   GLUCOSE 98 106* 91 102*  BUN 43* 44* 44* 22*  CREATININE 11.36* 11.74* 11.49* 9.09*  CALCIUM  9.0 10.1 9.3 9.7  MG  --   --   --  2.3  PHOS 7.6* 7.4* 5.8* 4.0    Liver Function Tests: Recent Labs  Lab 04/21/24 0745 04/23/24 0422 04/24/24 0915  ALBUMIN  3.9 4.4 3.7   No results for input(s): LIPASE, AMYLASE in the last 168 hours. No results for input(s): AMMONIA in the last 168 hours.   CBC: Recent Labs  Lab 04/21/24 0414 04/22/24 0521 04/23/24 0422 04/24/24 0915 04/26/24 0353  WBC 11.6* 10.2 10.1 5.5 9.8  NEUTROABS  --   --   --  2.6  --   HGB 12.4* 11.6* 11.3* 10.6* 11.1*  HCT 37.6* 35.3* 35.6* 32.4* 34.1*  MCV 92.6 93.4 95.7  94.5 96.1  PLT 261 232 236 196 200    Cardiac Enzymes: No results for input(s): CKTOTAL, CKMB, CKMBINDEX, TROPONINI in the last 168 hours.  BNP: Invalid input(s): POCBNP  CBG: Recent Labs  Lab 04/26/24 1656 04/26/24 2144 04/27/24 0837  GLUCAP 131* 148* 119*    Microbiology: Results for orders placed or performed during the hospital encounter of 04/07/24  Resp panel by RT-PCR (RSV, Flu A&B, Covid) Anterior Nasal Swab     Status: None   Collection Time: 04/07/24  4:46 PM   Specimen: Anterior Nasal Swab  Result Value Ref Range Status   SARS Coronavirus 2 by RT PCR NEGATIVE NEGATIVE Final    Comment: (NOTE) SARS-CoV-2 target nucleic acids are NOT DETECTED.  The SARS-CoV-2 RNA is generally detectable in upper respiratory specimens during the acute phase of infection. The lowest concentration of SARS-CoV-2 viral copies this assay can detect is 138 copies/mL. A negative result does not preclude SARS-Cov-2 infection and should not be used as the sole basis for treatment or other patient management decisions. A negative result may occur with  improper specimen collection/handling, submission of specimen other than nasopharyngeal swab, presence of viral mutation(s) within the areas targeted by this assay, and inadequate number of viral copies(<138 copies/mL). A negative result must be  combined with clinical observations, patient history, and epidemiological information. The expected result is Negative.  Fact Sheet for Patients:  BloggerCourse.com  Fact Sheet for Healthcare Providers:  SeriousBroker.it  This test is no t yet approved or cleared by the [place]  FDA and  has been authorized for detection and/or diagnosis of SARS-CoV-2 by FDA under an Emergency Use Authorization (EUA). This EUA will remain  in effect (meaning this test can be used) for the duration of the COVID-19 declaration under Section 564(b)(1)  of the Act, 21 U.S.C.section 360bbb-3(b)(1), unless the authorization is terminated  or revoked sooner.       Influenza A by PCR NEGATIVE NEGATIVE Final   Influenza B by PCR NEGATIVE NEGATIVE Final    Comment: (NOTE) The Xpert Xpress SARS-CoV-2/FLU/RSV plus assay is intended as an aid in the diagnosis of influenza from Nasopharyngeal swab specimens and should not be used as a sole basis for treatment. Nasal washings and aspirates are unacceptable for Xpert Xpress SARS-CoV-2/FLU/RSV testing.  Fact Sheet for Patients: BloggerCourse.com  Fact Sheet for Healthcare Providers: SeriousBroker.it  This test is not yet approved or cleared by the [place]  FDA and has been authorized for detection and/or diagnosis of SARS-CoV-2 by FDA under an Emergency Use Authorization (EUA). This EUA will remain in effect (meaning this test can be used) for the duration of the COVID-19 declaration under Section 564(b)(1) of the Act, 21 U.S.C. section 360bbb-3(b)(1), unless the authorization is terminated or revoked.     Resp Syncytial Virus by PCR NEGATIVE NEGATIVE Final    Comment: (NOTE) Fact Sheet for Patients: BloggerCourse.com  Fact Sheet for Healthcare Providers: SeriousBroker.it  This test is not yet approved or cleared by the [place]  FDA and has been authorized for detection and/or diagnosis of SARS-CoV-2 by FDA under an Emergency Use Authorization (EUA). This EUA will remain in effect (meaning this test can be used) for the duration of the COVID-19 declaration under Section 564(b)(1) of the Act, 21 U.S.C. section 360bbb-3(b)(1), unless the authorization is terminated or revoked.  Performed at [facility], [address] [place], [place] [address]     Coagulation Studies: No results for input(s): LABPROT, INR in the last 72 hours.  Urinalysis: No results  for input(s): COLORURINE, LABSPEC, PHURINE, GLUCOSEU, HGBUR, BILIRUBINUR, KETONESUR, PROTEINUR, UROBILINOGEN, NITRITE, LEUKOCYTESUR in the last 72 hours.  Invalid input(s): APPERANCEUR    Imaging: No results found.   Medications:     bisacodyl   10 mg Oral QHS   buPROPion   300 mg Oral Daily   calcitRIOL   0.25 mcg Oral Daily   Chlorhexidine  Gluconate Cloth  6 each Topical Daily   fludrocortisone   0.2 mg Oral Daily   heparin   5,000 Units Subcutaneous Q8H   insulin  aspart  0-15 Units Subcutaneous TID WC   methylPREDNISolone  (SOLU-MEDROL ) injection  125 mg Intravenous Daily   midodrine   10 mg Oral TID WC   pantoprazole   40 mg Oral Daily   polyethylene glycol  17 g Oral BID   sevelamer  carbonate  1,600 mg Oral TID WC   venlafaxine  XR  150 mg Oral Q breakfast   And   venlafaxine  XR  75 mg Oral Q breakfast   acetaminophen , bisacodyl , ondansetron  (ZOFRAN ) IV, ondansetron   Assessment/ Plan:  Mr. THAXTON PELLEY is a 58 y.o.  male  with a PMHx of hypertension, atrial fibrillation, BPH, hyperlipidemia, anxiety, constipation, morbid obesity, and end stage renal disease on hemodialysis.    [facility]- will need to  resubmit for chair at discharge.    End stage renal disease on hemodialysis          Reason for rapid lose of kidney function remains unknown. Would consider renal biopsy if patient remains inpatient next week. Next dialysis treatment scheduled for Saturday.    2.  Acute metabolic acidosis.  Serum bicarbonate 16 on admission. Corrected with dialysis   3.  Anemia of chronic kidney disease.  Hemoglobin 11.1 yesterday. Will continue to monitor with dialysis.    4.  Secondary hyperparathyroidism. PTH 199 during recent admission. Continue Renvela  2 tabs TID.   Will continue to monitor bone minerals during this admission.  5. Severe orthostatic hypotension Workup so far has included 2D echo from 04/11/2024 which shows LVEF 65 to 70%, normal LV  systolic function, mild concentric LVH, normal diastolic parameters, normal right ventricular systolic function.  A.m. cortisol level normal from 04/24/2024.  TSH normal.  Other differential includes amyloidosis.  Kidney biopsy or fat pad biopsy can be considered.     LOS: 8 Arsalan Brisbin 8/29/202511:37 AM

## 2024-04-27 NOTE — Progress Notes (Signed)
 PROGRESS NOTE    Brady Edwards  FMW:[id number] DOB: [birth year] DOA: 04/15/2024 PCP: Vicci Duwaine SQUIBB, DO  147A/147A-AA  LOS: 8 days   Brief hospital course:   Brady Edwards is a 58 y.o. male with medical history significant of newly ESRD recently started on HD, atrial fibrillation, anxiety and depression, morbid obesity who presented with complaints of weakness and falls.   Pt was very frustrated with his medical and mental problems, and wanted us  to make him better.  Pt complained of fatigue, lack of energy, and weakness, causing to fall several times since he was discharged from hospital on 04/13/24.  No focal neuro deficits.  Pt said he has had long standing mental health issues and needs to see psych.  Pt complained of constipation for the past 2 months that wasn't addressed during his last hospitalization.     Of note, pt was hospitalized from 04/07/24 to 04/13/24 during which he was started on dialysis, and was discharged after dialysis on 8/15 to continue outpatient dialysis TTS.   ED Course: initial vitals: afebrile, pulse 105, BP 86/65, RR 12, sating 99% on room air.  Labs consistent with ESRD.  CXR no acute finding.  EKG showed sinus tachycardia.  CT head no acute finding.  ED provider reported pt said he would be better off not living at this point but does not have a plan for suicide and is going to receive help.  Psych consult placed.  ED provider contacted oncall nephro, who plans dialysis for pt tomorrow.  Assessment & Plan:  Brady Edwards is a 58 y.o. male with medical history significant of recently diagnosed ESRD now on HD, atrial fibrillation, anxiety and depression, morbid obesity who presented with complaints of weakness and falls.    # Orthostatic Hypotension Persistent, symptomatic when going from seated to standing position. No concern for infection. No known neurological conditions that could be contributing. AM cortisol WNL though drawn at 0500.  -- Continue  to hold home Toprol  due to low BP --Continue midodrine  10 mg TID --TED/compression hose abd binder  -- Increase florinef  0.2mg  every day --Fluid intake liberalized per nephro -- Florinef  and midodrine  remain ineffective need to consider alternative agent 8/28 stared steroids, Solu-Medrol  125 mg IV daily for 3 days, PPI for GI prophylaxis and insulin  sliding scale monitor CBG for hyperglycemia   # Weakness and fall  --Fatigue and weakness have been progressive, due to recent hospitalization and decline in health.  CT head wnl, no acute finding. Noted to have elevated BUN (unclear of time line regarding rise in BUN). Also noted to have significant orthostatic hypotension. Suspect this is likely cause of fall.  --PT/OT recommending SNF 8/27 check autoantibodies to rule out any autoimmune disorder.    # ESRD on HD TTS Secondary hyperparathyroidism Calcitriol  deficiency  --recently started on dialysis --HD per nephro   # Normocytic anemia, mild  Likely anemia of chronic disease  Stable.  Iron panel within normal limits.  B12 within normal limits.  Folate within normal limits -ESA per nephrology     Latest Ref Rng & Units 04/26/2024    3:53 AM 04/24/2024    9:15 AM 04/23/2024    4:22 AM  CBC  WBC 4.0 - 10.5 K/uL 9.8  5.5  10.1   Hemoglobin 13.0 - 17.0 g/dL 88.8  89.3  88.6   Hematocrit 39.0 - 52.0 % 34.1  32.4  35.6   Platelets 150 - 400 K/uL 200  196  236     #  Hx of Afib --pt not taking anticoagulation PTA.  Currently in sinus. --hold home Toprol  due to low BP   # Depression and hx of panic disorder # Passive SI --psych consulted --no need for SI precaution, per psych --cont home bupropion  and Effexor   # Vitamin D  deficiency: Continue Calcitrol    # OSA --CPAP nightly   # Obesity, Class III, BMI 40-49.9 (morbid obesity)  Calorie restricted diet and daily exercise advised to lose body weight.  Lifestyle modification discussed.   DVT prophylaxis: Heparin  SQ Code  Status: Full code  Family Communication:  Level of care: Med-Surg Dispo:   The patient is from: home Anticipated d/c is to: SNF rehab, patient is currently unhomed as he was evicted recently. TOC consulted. Patient remains hospitalized given persistent severe orthostatic hypotension  Anticipated d/c date is: whenever SNF accepts    Subjective and Interval History:  Patient was seen and examined at bedside today. No any active issues overnight.  As per patient orthostatics were done, still blood pressure is low with dropping and feels mild lightheadedness but not as much.  Gradually getting better. Patient was advised to be upright and try to ambulate today with PT/OT  Objective: Vitals:   04/26/24 1147 04/26/24 1938 04/27/24 0406 04/27/24 0800  BP: (!) 101/47 97/61 94/60  90/64  Pulse: 85 82 86 77  Resp:  18 18 20   Temp: 98.7 F (37.1 C) 98.3 F (36.8 C) 97.9 F (36.6 C) 98.2 F (36.8 C)  TempSrc: Oral   Oral  SpO2: 100% 97% 94% 99%  Weight:      Height:        Intake/Output Summary (Last 24 hours) at 04/27/2024 1432 Last data filed at 04/27/2024 1414 Gross per 24 hour  Intake 325 ml  Output 300 ml  Net 25 ml   Filed Weights   04/25/24 1206 04/26/24 0801 04/26/24 1133  Weight: 120.3 kg 118.8 kg 118.9 kg    Examination:    Constitutional: In no distress.  Cardiovascular: Normal rate, regular rhythm. No lower extremity edema  Pulmonary: Non labored breathing on room air, no wheezing or rales.   Abdominal: Soft. Non distended and non tender Musculoskeletal: Normal range of motion.     Neurological: Alert and oriented to person, place, and time. Non focal  Skin: Skin is warm and dry.     Data Reviewed: I have personally reviewed labs and imaging studies   Time spent: 35 minutes  Elvan Sor, MD [facility] If 7PM-7AM, please contact night-coverage 04/27/2024, 2:32 PM

## 2024-04-27 NOTE — Plan of Care (Signed)
   Problem: Education: Goal: Knowledge of General Education information will improve Description Including pain rating scale, medication(s)/side effects and non-pharmacologic comfort measures Outcome: Progressing

## 2024-04-27 NOTE — Progress Notes (Signed)
 Physical Therapy Treatment Patient Details Name: Brady Edwards MRN: [id number] DOB: 12-Feb-1966 Today's Date: 04/27/2024   History of Present Illness Brady Edwards is a 58 y.o. male with pmh of hypertension, atrial fibrillation on metoprolol  not on anticoagulation, BPH, hyperlipidemia, anxiety, constipation, morbid obesity recently admitted from 8/9-8/15/25 recently initiated on dialysis who presents from home with general weakness, frequent falls since discharge, shortness of breath and passive suicidal ideation.    PT Comments  Pt resting in bed upon PT arrival; pt agreeable to therapy.  Pt reports staff tried to get orthostatics earlier today but he wasn't wearing his TED hose or abdominal binder and didn't get the 3 minute standing BP reading (d/t being symptomatic).  Therapist took orthostatics during session (with abdominal binder and TED hose on).  Supine BP 118/74 (with MAP 88) and HR 74 bpm; sitting BP 93/66 (MAP 76) with HR 86 bpm; standing (at 0 minutes) BP 94/59 (MAP 69) with HR 108 bpm; and standing (at 3 minutes) BP 74/59 (MAP 67) with HR 115 bpm.  Pt reporting mild symptoms (lightheaded) when initially sitting on EOB and also in standing.  Pt 's BP in sitting (after transferring to recliner) was 109/80 (MAP 90) with HR 85 bpm.  Pt requesting to walk.  1st trial ambulation (40 feet no AD use) pt limited by reports of lightheadedness and imbalance (post ambulation trial in sitting pt's BP 115/75 (MAP 88) and HR 87 bpm).  2nd trial ambulation (220 feet no AD use) pt reporting consistent light buzz feeling in head during ambulation (post ambulation in sitting pt's BP 108/80 with MAP 89 and HR 101 bpm).  Pt educated on more gradual transition changes for safety and d/t BP concerns (pt verbalizing understanding).  Will continue to focus on strengthening, balance, and progressive functional mobility during hospitalization.   If plan is discharge home, recommend the following: A little help  with walking and/or transfers;Help with stairs or ramp for entrance;Direct supervision/assist for medications management;A little help with bathing/dressing/bathroom   Can travel by private vehicle        Equipment Recommendations  None recommended by PT    Recommendations for Other Services       Precautions / Restrictions Precautions Precautions: Fall Recall of Precautions/Restrictions: Intact Precaution/Restrictions Comments: Monitor BP Restrictions Weight Bearing Restrictions Per Provider Order: No     Mobility  Bed Mobility Overal bed mobility: Independent             General bed mobility comments: Semi-supine to sitting without any noted difficulties    Transfers Overall transfer level: Needs assistance Equipment used: None Transfers: Sit to/from Stand Sit to Stand: Contact guard assist           General transfer comment: fairly strong stand from bed x3 trials and from recliner x1 trial; stand step turn bed to recliner (no AD use) CGA    Ambulation/Gait Ambulation/Gait assistance: Contact guard assist Gait Distance (Feet):  (40 feet; 220 feet) Assistive device: None Gait Pattern/deviations: Step-through pattern, Decreased stride length Gait velocity: decreased     General Gait Details: CGA for safety   Stairs             Wheelchair Mobility     Tilt Bed    Modified Rankin (Stroke Patients Only)       Balance Overall balance assessment: Needs assistance Sitting-balance support: No upper extremity supported, Feet supported Sitting balance-Leahy Scale: Normal Sitting balance - Comments: steady reaching outside BOS   Standing balance  support: No upper extremity supported Standing balance-Leahy Scale: Good Standing balance comment: steady reaching within BOS                            Communication Communication Communication: No apparent difficulties  Cognition Arousal: Alert Behavior During Therapy: WFL for tasks  assessed/performed, Impulsive (Pt tends to be quick with transitions/mobility (pt reports he is always this way))   PT - Cognitive impairments: No apparent impairments                         Following commands: Intact      Cueing Cueing Techniques: Verbal cues  Exercises      General Comments  Nursing cleared pt for participation in physical therapy.  Pt agreeable to PT session.      Pertinent Vitals/Pain Pain Assessment Pain Assessment: No/denies pain    Home Living                          Prior Function            PT Goals (current goals can now be found in the care plan section) Acute Rehab PT Goals Patient Stated Goal: improve independence; find out his medical problems PT Goal Formulation: With patient Time For Goal Achievement: 04/30/24 Potential to Achieve Goals: Fair Progress towards PT goals: Progressing toward goals    Frequency    Min 1X/week      PT Plan      Co-evaluation              AM-PAC PT 6 Clicks Mobility   Outcome Measure  Help needed turning from your back to your side while in a flat bed without using bedrails?: None Help needed moving from lying on your back to sitting on the side of a flat bed without using bedrails?: None Help needed moving to and from a bed to a chair (including a wheelchair)?: A Little Help needed standing up from a chair using your arms (e.g., wheelchair or bedside chair)?: A Little Help needed to walk in hospital room?: A Little Help needed climbing 3-5 steps with a railing? : A Little 6 Click Score: 20    End of Session Equipment Utilized During Treatment: Gait belt Activity Tolerance: Other (comment) (Limited at times d/t pt symptomatic with certain activities (low BP; increased HR)) Patient left: in chair;with call bell/phone within reach;with chair alarm set Nurse Communication: Mobility status;Precautions;Other (comment) (Pt's vitals and symptoms during session) PT Visit  Diagnosis: Muscle weakness (generalized) (M62.81);History of falling (Z91.81)     Time: 1120-1205 PT Time Calculation (min) (ACUTE ONLY): 45 min  Charges:    $Gait Training: 8-22 mins $Therapeutic Exercise: 8-22 mins $Therapeutic Activity: 8-22 mins PT General Charges $$ ACUTE PT VISIT: 1 Visit                     Damien Caulk, PT 04/27/24, 12:40 PM

## 2024-04-28 LAB — RENAL FUNCTION PANEL
Albumin: 3.6 g/dL (ref 3.5–5.0)
Anion gap: 16 — ABNORMAL HIGH (ref 5–15)
BUN: 33 mg/dL — ABNORMAL HIGH (ref 6–20)
CO2: 26 mmol/L (ref 22–32)
Calcium: 9.6 mg/dL (ref 8.9–10.3)
Chloride: 91 mmol/L — ABNORMAL LOW (ref 98–111)
Creatinine, Ser: 9.88 mg/dL — ABNORMAL HIGH (ref 0.61–1.24)
GFR, Estimated: 6 mL/min — ABNORMAL LOW (ref >60)
Glucose, Bld: 87 mg/dL (ref 70–99)
Phosphorus: 4.1 mg/dL (ref 2.5–4.6)
Potassium: 4.2 mmol/L (ref 3.5–5.1)
Sodium: 133 mmol/L — ABNORMAL LOW (ref 135–145)

## 2024-04-28 LAB — CBC
HCT: 28.3 % — ABNORMAL LOW (ref 39.0–52.0)
Hemoglobin: 9.2 g/dL — ABNORMAL LOW (ref 13.0–17.0)
MCH: 31.2 pg (ref 26.0–34.0)
MCHC: 32.5 g/dL (ref 30.0–36.0)
MCV: 95.9 fL (ref 80.0–100.0)
Platelets: 186 K/uL (ref 150–400)
RBC: 2.95 MIL/uL — ABNORMAL LOW (ref 4.22–5.81)
RDW: 13.4 % (ref 11.5–15.5)
WBC: 16.1 K/uL — ABNORMAL HIGH (ref 4.0–10.5)
nRBC: 0 % (ref 0.0–0.2)

## 2024-04-28 LAB — GLUCOSE, CAPILLARY
Glucose-Capillary: 67 mg/dL — ABNORMAL LOW (ref 70–99)
Glucose-Capillary: 160 mg/dL — ABNORMAL HIGH (ref 70–99)
Glucose-Capillary: 188 mg/dL — ABNORMAL HIGH (ref 70–99)
Glucose-Capillary: 156 mg/dL — ABNORMAL HIGH (ref 70–99)

## 2024-04-28 MED ORDER — HEPARIN SODIUM (PORCINE) 1000 UNIT/ML IJ SOLN
INTRAMUSCULAR | Status: AC
  Filled 2024-04-28: qty 5

## 2024-04-28 MED ORDER — MIDODRINE HCL 5 MG PO TABS
ORAL_TABLET | ORAL | Status: AC
  Filled 2024-04-28: qty 2

## 2024-04-28 MED ORDER — HYDROCORTISONE 10 MG PO TABS
20.0000 mg | ORAL_TABLET | Freq: Every day | ORAL | Status: AC
  Administered 2024-05-02: 20 mg via ORAL
  Filled 2024-04-28: qty 2

## 2024-04-28 MED ORDER — HYDROCORTISONE 10 MG PO TABS
20.0000 mg | ORAL_TABLET | Freq: Every evening | ORAL | Status: AC
  Administered 2024-04-30: 20 mg via ORAL
  Filled 2024-04-28: qty 2

## 2024-04-28 MED ORDER — HYDROCORTISONE 5 MG PO TABS
5.0000 mg | ORAL_TABLET | Freq: Every evening | ORAL | Status: AC
  Administered 2024-05-03: 5 mg via ORAL
  Filled 2024-04-28: qty 1

## 2024-04-28 MED ORDER — HYDROCORTISONE 10 MG PO TABS
10.0000 mg | ORAL_TABLET | Freq: Every evening | ORAL | Status: AC
  Administered 2024-05-02: 10 mg via ORAL
  Filled 2024-04-28: qty 1

## 2024-04-28 MED ORDER — HYDROCORTISONE 10 MG PO TABS
40.0000 mg | ORAL_TABLET | Freq: Every day | ORAL | Status: AC
  Administered 2024-04-30: 40 mg via ORAL
  Filled 2024-04-28: qty 4

## 2024-04-28 MED ORDER — HYDROCORTISONE 5 MG PO TABS
25.0000 mg | ORAL_TABLET | Freq: Every evening | ORAL | Status: AC
  Administered 2024-04-29: 25 mg via ORAL
  Filled 2024-04-28: qty 1

## 2024-04-28 MED ORDER — HYDROCORTISONE 10 MG PO TABS
10.0000 mg | ORAL_TABLET | Freq: Every day | ORAL | Status: AC
  Filled 2024-04-28: qty 1

## 2024-04-28 MED ORDER — HYDROCORTISONE 5 MG PO TABS
15.0000 mg | ORAL_TABLET | Freq: Every evening | ORAL | Status: AC
  Administered 2024-05-01: 15 mg via ORAL
  Filled 2024-04-28: qty 1

## 2024-04-28 MED ORDER — HYDROCORTISONE 10 MG PO TABS
30.0000 mg | ORAL_TABLET | Freq: Every day | ORAL | Status: AC
  Filled 2024-04-28: qty 3

## 2024-04-28 MED ORDER — HYDROCORTISONE 10 MG PO TABS
50.0000 mg | ORAL_TABLET | Freq: Every day | ORAL | Status: AC
  Administered 2024-04-29: 50 mg via ORAL
  Filled 2024-04-28: qty 5

## 2024-04-28 NOTE — Plan of Care (Signed)
  Problem: Education: Goal: Knowledge of General Education information will improve Description: Including pain rating scale, medication(s)/side effects and non-pharmacologic comfort measures Outcome: Progressing   Problem: Health Behavior/Discharge Planning: Goal: Ability to manage health-related needs will improve Outcome: Progressing   Problem: Activity: Goal: Risk for activity intolerance will decrease Outcome: Progressing   Problem: Nutrition: Goal: Adequate nutrition will be maintained Outcome: Progressing   Problem: Elimination: Goal: Will not experience complications related to bowel motility Outcome: Progressing   Problem: Safety: Goal: Ability to remain free from injury will improve Outcome: Progressing   Problem: Skin Integrity: Goal: Risk for impaired skin integrity will decrease Outcome: Progressing   Problem: Fluid Volume: Goal: Ability to maintain a balanced intake and output will improve Outcome: Progressing

## 2024-04-28 NOTE — Progress Notes (Signed)
 Hemodialysis Note:  Received patient in bed to unit. Alert and oriented. Informed consent singed and in chart.  Treatment initiated: 0740 Treatment completed: 1145  Access used: Right internal jugular catheter Access issues: None  Patient tolerated well. Transported back to room, alert without acute distress. Report given to patient's RN.  Total UF removed: 500 ml Medications given: Midodrine  10 mg Tablet  Post HD weight: 123.5 Kg  [name]

## 2024-04-28 NOTE — Plan of Care (Signed)

## 2024-04-28 NOTE — Progress Notes (Signed)
 PROGRESS NOTE    Brady Edwards  FMW:[id number] DOB: 1965-10-04 DOA: 04/15/2024 PCP: Vicci Duwaine SQUIBB, DO  147A/147A-AA  LOS: 9 days   Brief hospital course:   Brady Edwards is a 58 y.o. male with medical history significant of newly ESRD recently started on HD, atrial fibrillation, anxiety and depression, morbid obesity who presented with complaints of weakness and falls.   Pt was very frustrated with his medical and mental problems, and wanted us  to make him better.  Pt complained of fatigue, lack of energy, and weakness, causing to fall several times since he was discharged from hospital on 04/13/24.  No focal neuro deficits.  Pt said he has had long standing mental health issues and needs to see psych.  Pt complained of constipation for the past 2 months that wasn't addressed during his last hospitalization.     Of note, pt was hospitalized from 04/07/24 to 04/13/24 during which he was started on dialysis, and was discharged after dialysis on 8/15 to continue outpatient dialysis TTS.   ED Course: initial vitals: afebrile, pulse 105, BP 86/65, RR 12, sating 99% on room air.  Labs consistent with ESRD.  CXR no acute finding.  EKG showed sinus tachycardia.  CT head no acute finding.  ED provider reported pt said he would be better off not living at this point but does not have a plan for suicide and is going to receive help.  Psych consult placed.  ED provider contacted oncall nephro, who plans dialysis for pt tomorrow.  Assessment & Plan:  Brady Edwards is a 58 y.o. male with medical history significant of recently diagnosed ESRD now on HD, atrial fibrillation, anxiety and depression, morbid obesity who presented with complaints of weakness and falls.    # Orthostatic Hypotension Persistent, symptomatic when going from seated to standing position. No concern for infection. No known neurological conditions that could be contributing. AM cortisol WNL though drawn at 0500.  -- Continue  to hold home Toprol  due to low BP --Continue midodrine  10 mg TID --TED/compression hose abd binder  -- Increase florinef  0.2mg  every day --Fluid intake liberalized per nephro -- Florinef  and midodrine  remain ineffective need to consider alternative agent 8/28 stared steroids, Solu-Medrol  125 mg IV daily for 3 days, PPI for GI prophylaxis and insulin  sliding scale monitor CBG for hyperglycemia 8/30 patient felt improvement with the steroids, still has orthostasis, BP drops at standing for 3 minutes.  But no problem with ambulation. Started Cortef  50 mg in the morning and 25 mg in the evening, started tapering dose over 5 days. Hopefully patient will not need more steroids and blood pressure will improve in few days.  # Weakness and fall  --Fatigue and weakness have been progressive, due to recent hospitalization and decline in health.  CT head wnl, no acute finding. Noted to have elevated BUN (unclear of time line regarding rise in BUN). Also noted to have significant orthostatic hypotension. Suspect this is likely cause of fall.  --PT/OT recommending SNF 8/27 check autoantibodies to rule out any autoimmune disorder.    # ESRD on HD TTS Secondary hyperparathyroidism Calcitriol  deficiency  --recently started on dialysis --HD per nephro   # Normocytic anemia, mild  Likely anemia of chronic disease  Stable.  Iron panel within normal limits.  B12 within normal limits.  Folate within normal limits -ESA per nephrology     Latest Ref Rng & Units 04/28/2024    7:37 AM 04/26/2024    3:53  AM 04/24/2024    9:15 AM  CBC  WBC 4.0 - 10.5 K/uL 16.1  9.8  5.5   Hemoglobin 13.0 - 17.0 g/dL 9.2  88.8  89.3   Hematocrit 39.0 - 52.0 % 28.3  34.1  32.4   Platelets 150 - 400 K/uL 186  200  196     # Hx of Afib --pt not taking anticoagulation PTA.  Currently in sinus. --hold home Toprol  due to low BP   # Depression and hx of panic disorder # Passive SI --psych consulted --no need for SI  precaution, per psych --cont home bupropion  and Effexor   # Vitamin D  deficiency: Continue Calcitrol    # OSA --CPAP nightly   # Obesity, Class III, BMI 40-49.9 (morbid obesity)  Calorie restricted diet and daily exercise advised to lose body weight.  Lifestyle modification discussed.   DVT prophylaxis: Heparin  SQ Code Status: Full code  Family Communication:  Level of care: Med-Surg Dispo:   The patient is from: home Anticipated d/c is to: SNF rehab, patient is currently unhomed as he was evicted recently. TOC consulted. Patient remains hospitalized given persistent severe orthostatic hypotension  Anticipated d/c date is: whenever SNF accepts or shelter will be arranged by TOC   Subjective and Interval History:  Patient was seen and examined at bedside today. No any active issues overnight.  As per patient his blood pressure still drops while standing for 3 minutes and was feeling little dizzy but is not having problems with ambulation, overall he feels much more better.  Able to sit up longer now. Patient is still looking for a shelter, until then we need to keep the patient, not a safe discharge.   Objective: Vitals:   04/28/24 1145 04/28/24 1148 04/28/24 1152 04/28/24 1221  BP: 103/60  (!) 102/52 (!) 105/58  Pulse: 71 73 77 72  Resp: 20  14 16   Temp: 97.7 F (36.5 C)   98.7 F (37.1 C)  TempSrc: Oral     SpO2: 100%   98%  Weight:   123.5 kg   Height:        Intake/Output Summary (Last 24 hours) at 04/28/2024 1354 Last data filed at 04/28/2024 1152 Gross per 24 hour  Intake 75 ml  Output 500 ml  Net -425 ml   Filed Weights   04/26/24 1133 04/28/24 0733 04/28/24 1152  Weight: 118.9 kg 124 kg 123.5 kg    Examination:    Constitutional: In no distress.  Cardiovascular: Normal rate, regular rhythm. No lower extremity edema  Pulmonary: Non labored breathing on room air, no wheezing or rales.   Abdominal: Soft. Non distended and non tender Musculoskeletal:  Normal range of motion.     Neurological: Alert and oriented to person, place, and time. Non focal  Skin: Skin is warm and dry.     Data Reviewed: I have personally reviewed labs and imaging studies   Time spent: 35 minutes  Elvan Sor, MD [facility] If 7PM-7AM, please contact night-coverage 04/28/2024, 1:54 PM

## 2024-04-28 NOTE — Progress Notes (Signed)
 [facility]  ROUNDING NOTE   Subjective:   Patient seen and evaluated during dialysis.    HEMODIALYSIS FLOWSHEET:  Blood Flow Rate (mL/min): 0 mL/min Arterial Pressure (mmHg): 57.77 mmHg Venous Pressure (mmHg): 18.99 mmHg TMP (mmHg): -3.63 mmHg Ultrafiltration Rate (mL/min): 583 mL/min Dialysate Flow Rate (mL/min): 299 ml/min Dialysis Fluid Bolus: Albumin  Bolus Amount (mL): 100 mL  Tolerating treatment well   Objective:  Vital signs in last 24 hours:  Temp:  [97.5 F (36.4 C)-98.3 F (36.8 C)] 97.6 F (36.4 C) (08/30 0733) Pulse Rate:  [61-103] 77 (08/30 0915) Resp:  [13-20] 13 (08/30 0915) BP: (86-151)/(58-76) 94/63 (08/30 0915) SpO2:  [94 %-99 %] 97 % (08/30 0915) Weight:  [124 kg] 124 kg (08/30 0733)  Weight change:  Filed Weights   04/26/24 0801 04/26/24 1133 04/28/24 0733  Weight: 118.8 kg 118.9 kg 124 kg    Intake/Output: I/O last 3 completed shifts: In: 325 [P.O.:325] Out: 300 [Urine:300]   Intake/Output this shift:  No intake/output data recorded.  Physical Exam: General: NAD, missing teeth, large habitus  Head: Normocephalic  Eyes: Anicteric  Lungs:  Clear, room air  Heart: Regular rate  Abdomen:  Soft, distended   Extremities:  None peripheral edema.  Neurologic: Alert, awake  Skin: No lesions  Access: Rt chest permcath    Basic Metabolic Panel: Recent Labs  Lab 04/23/24 0422 04/24/24 0915 04/26/24 0353 04/28/24 0737  NA 135 135 136 133*  K 3.6 3.7 4.0 4.2  CL 92* 95* 96* 91*  CO2 25 24 28 26   GLUCOSE 106* 91 102* 87  BUN 44* 44* 22* 33*  CREATININE 11.74* 11.49* 9.09* 9.88*  CALCIUM  10.1 9.3 9.7 9.6  MG  --   --  2.3  --   PHOS 7.4* 5.8* 4.0 4.1    Liver Function Tests: Recent Labs  Lab 04/23/24 0422 04/24/24 0915 04/28/24 0737  ALBUMIN  4.4 3.7 3.6   No results for input(s): LIPASE, AMYLASE in the last 168 hours. No results for input(s): AMMONIA in the last 168 hours.   CBC: Recent Labs  Lab  04/22/24 0521 04/23/24 0422 04/24/24 0915 04/26/24 0353 04/28/24 0737  WBC 10.2 10.1 5.5 9.8 16.1*  NEUTROABS  --   --  2.6  --   --   HGB 11.6* 11.3* 10.6* 11.1* 9.2*  HCT 35.3* 35.6* 32.4* 34.1* 28.3*  MCV 93.4 95.7 94.5 96.1 95.9  PLT 232 236 196 200 186    Cardiac Enzymes: No results for input(s): CKTOTAL, CKMB, CKMBINDEX, TROPONINI in the last 168 hours.  BNP: Invalid input(s): POCBNP  CBG: Recent Labs  Lab 04/26/24 2144 04/27/24 0837 04/27/24 1142 04/27/24 1552 04/27/24 2045  GLUCAP 148* 119* 157* 148* 130*    Microbiology: Results for orders placed or performed during the hospital encounter of 04/07/24  Resp panel by RT-PCR (RSV, Flu Brady&B, Covid) Anterior Nasal Swab     Status: None   Collection Time: 04/07/24  4:46 PM   Specimen: Anterior Nasal Swab  Result Value Ref Range Status   SARS Coronavirus 2 by RT PCR NEGATIVE NEGATIVE Final    Comment: (NOTE) SARS-CoV-2 target nucleic acids are NOT DETECTED.  The SARS-CoV-2 RNA is generally detectable in upper respiratory specimens during the acute phase of infection. The lowest concentration of SARS-CoV-2 viral copies this assay can detect is 138 copies/mL. Brady negative result does not preclude SARS-Cov-2 infection and should not be used as the sole basis for treatment or other patient management decisions. Brady  negative result may occur with  improper specimen collection/handling, submission of specimen other than nasopharyngeal swab, presence of viral mutation(s) within the areas targeted by this assay, and inadequate number of viral copies(<138 copies/mL). Brady negative result must be combined with clinical observations, patient history, and epidemiological information. The expected result is Negative.  Fact Sheet for Patients:  BloggerCourse.com  Fact Sheet for Healthcare Providers:  SeriousBroker.it  This test is no t yet approved or cleared by the  [place]  FDA and  has been authorized for detection and/or diagnosis of SARS-CoV-2 by FDA under an Emergency Use Authorization (EUA). This EUA will remain  in effect (meaning this test can be used) for the duration of the COVID-19 declaration under Section 564(b)(1) of the Act, 21 U.S.C.section 360bbb-3(b)(1), unless the authorization is terminated  or revoked sooner.       Influenza Brady by PCR NEGATIVE NEGATIVE Final   Influenza B by PCR NEGATIVE NEGATIVE Final    Comment: (NOTE) The Xpert Xpress SARS-CoV-2/FLU/RSV plus assay is intended as an aid in the diagnosis of influenza from Nasopharyngeal swab specimens and should not be used as Brady sole basis for treatment. Nasal washings and aspirates are unacceptable for Xpert Xpress SARS-CoV-2/FLU/RSV testing.  Fact Sheet for Patients: BloggerCourse.com  Fact Sheet for Healthcare Providers: SeriousBroker.it  This test is not yet approved or cleared by the [place]  FDA and has been authorized for detection and/or diagnosis of SARS-CoV-2 by FDA under an Emergency Use Authorization (EUA). This EUA will remain in effect (meaning this test can be used) for the duration of the COVID-19 declaration under Section 564(b)(1) of the Act, 21 U.S.C. section 360bbb-3(b)(1), unless the authorization is terminated or revoked.     Resp Syncytial Virus by PCR NEGATIVE NEGATIVE Final    Comment: (NOTE) Fact Sheet for Patients: BloggerCourse.com  Fact Sheet for Healthcare Providers: SeriousBroker.it  This test is not yet approved or cleared by the [place]  FDA and has been authorized for detection and/or diagnosis of SARS-CoV-2 by FDA under an Emergency Use Authorization (EUA). This EUA will remain in effect (meaning this test can be used) for the duration of the COVID-19 declaration under Section 564(b)(1) of the Act, 21 U.S.C. section  360bbb-3(b)(1), unless the authorization is terminated or revoked.  Performed at [facility], [address] [place], [place] [address]     Coagulation Studies: No results for input(s): LABPROT, INR in the last 72 hours.  Urinalysis: No results for input(s): COLORURINE, LABSPEC, PHURINE, GLUCOSEU, HGBUR, BILIRUBINUR, KETONESUR, PROTEINUR, UROBILINOGEN, NITRITE, LEUKOCYTESUR in the last 72 hours.  Invalid input(s): APPERANCEUR    Imaging: No results found.   Medications:     bisacodyl   10 mg Oral QHS   buPROPion   300 mg Oral Daily   calcitRIOL   0.25 mcg Oral Daily   Chlorhexidine  Gluconate Cloth  6 each Topical Daily   fludrocortisone   0.2 mg Oral Daily   heparin   5,000 Units Subcutaneous Q8H   insulin  aspart  0-15 Units Subcutaneous TID WC   methylPREDNISolone  (SOLU-MEDROL ) injection  125 mg Intravenous Daily   midodrine   10 mg Oral TID WC   pantoprazole   40 mg Oral Daily   polyethylene glycol  17 g Oral BID   sevelamer  carbonate  1,600 mg Oral TID WC   venlafaxine  XR  150 mg Oral Q breakfast   And   venlafaxine  XR  75 mg Oral Q breakfast   acetaminophen , bisacodyl , ondansetron  (ZOFRAN ) IV, ondansetron   Assessment/ Plan:  [name]  Brady Edwards is Brady 58 y.o.  male  with Brady PMHx of hypertension, atrial fibrillation, BPH, hyperlipidemia, anxiety, constipation, morbid obesity, and end stage renal disease on hemodialysis.    [facility]- will need to resubmit for chair at discharge.    End stage renal disease on hemodialysis          Reason for rapid lose of kidney function remains unknown. ECHO completed during previous admission has EF 65-70%. Receiving dialysis today, UF 0.5L as tolerated. Next treatment scheduled for Tuesday.    2.  Acute metabolic acidosis.  Serum bicarbonate 16 on admission. Corrected and will continue to manage with dialysis.   3.  Anemia of chronic kidney disease.  Hemoglobin 9.2 today. Will monitor  need for ESA.     4.  Secondary hyperparathyroidism. PTH 199 during recent admission. Continue Renvela  2 tabs TID.   Calcium  9.6, phos 4.1. Monitoring bone minerals.   5. Severe orthostatic hypotension Workup so far has included 2D echo from 04/11/2024 which shows LVEF 65 to 70%, normal LV systolic function, mild concentric LVH, normal diastolic parameters, normal right ventricular systolic function.  Brady.m. cortisol level normal from 04/24/2024.  TSH normal.  Other differential includes amyloidosis.  Kidney biopsy or fat pad biopsy can be considered.     LOS: 9 Parrish Daddario 8/30/20259:28 AM

## 2024-04-29 LAB — GLUCOSE, CAPILLARY
Glucose-Capillary: 73 mg/dL (ref 70–99)
Glucose-Capillary: 85 mg/dL (ref 70–99)
Glucose-Capillary: 109 mg/dL — ABNORMAL HIGH (ref 70–99)
Glucose-Capillary: 112 mg/dL — ABNORMAL HIGH (ref 70–99)

## 2024-04-29 MED ORDER — EPOETIN ALFA-EPBX 10000 UNIT/ML IJ SOLN: 4000.0000 [IU] | INTRAMUSCULAR | Status: DC

## 2024-04-29 NOTE — Plan of Care (Signed)
  Problem: Education: Goal: Knowledge of General Education information will improve Description: Including pain rating scale, medication(s)/side effects and non-pharmacologic comfort measures Outcome: Progressing   Problem: Health Behavior/Discharge Planning: Goal: Ability to manage health-related needs will improve Outcome: Progressing   Problem: Activity: Goal: Risk for activity intolerance will decrease Outcome: Progressing   Problem: Nutrition: Goal: Adequate nutrition will be maintained Outcome: Progressing   Problem: Elimination: Goal: Will not experience complications related to bowel motility Outcome: Progressing Goal: Will not experience complications related to urinary retention Outcome: Progressing   Problem: Pain Managment: Goal: General experience of comfort will improve and/or be controlled Outcome: Progressing   Problem: Safety: Goal: Ability to remain free from injury will improve Outcome: Progressing   Problem: Education: Goal: Ability to describe self-care measures that may prevent or decrease complications (Diabetes Survival Skills Education) will improve Outcome: Progressing Goal: Individualized Educational Video(s) Outcome: Progressing

## 2024-04-29 NOTE — Progress Notes (Signed)
 [facility]  ROUNDING NOTE   Subjective:   Patient seen laying in bed States he feels well today Denies dizziness while laying States he sits at bedside for a few minutes before rising.    Objective:  Vital signs in last 24 hours:  Temp:  [97.6 F (36.4 C)-98.7 F (37.1 C)] 97.7 F (36.5 C) (08/31 0801) Pulse Rate:  [66-85] 67 (08/31 0801) Resp:  [14-21] 18 (08/31 0518) BP: (94-121)/(52-70) 97/63 (08/31 0801) SpO2:  [97 %-100 %] 100 % (08/31 0801) Weight:  [123.5 kg] 123.5 kg (08/30 1152)  Weight change:  Filed Weights   04/26/24 1133 04/28/24 0733 04/28/24 1152  Weight: 118.9 kg 124 kg 123.5 kg    Intake/Output: I/O last 3 completed shifts: In: 480 [P.O.:480] Out: 500 [Other:500]   Intake/Output this shift:  No intake/output data recorded.  Physical Exam: General: NAD, missing teeth, large habitus  Head: Normocephalic  Eyes: Anicteric  Lungs:  Clear, room air  Heart: Regular rate  Abdomen:  Soft, distended   Extremities:  No peripheral edema.  Neurologic: Alert, awake  Skin: No lesions  Access: Rt chest permcath    Basic Metabolic Panel: Recent Labs  Lab 04/23/24 0422 04/24/24 0915 04/26/24 0353 04/28/24 0737  NA 135 135 136 133*  K 3.6 3.7 4.0 4.2  CL 92* 95* 96* 91*  CO2 25 24 28 26   GLUCOSE 106* 91 102* 87  BUN 44* 44* 22* 33*  CREATININE 11.74* 11.49* 9.09* 9.88*  CALCIUM  10.1 9.3 9.7 9.6  MG  --   --  2.3  --   PHOS 7.4* 5.8* 4.0 4.1    Liver Function Tests: Recent Labs  Lab 04/23/24 0422 04/24/24 0915 04/28/24 0737  ALBUMIN  4.4 3.7 3.6   No results for input(s): LIPASE, AMYLASE in the last 168 hours. No results for input(s): AMMONIA in the last 168 hours.   CBC: Recent Labs  Lab 04/23/24 0422 04/24/24 0915 04/26/24 0353 04/28/24 0737  WBC 10.1 5.5 9.8 16.1*  NEUTROABS  --  2.6  --   --   HGB 11.3* 10.6* 11.1* 9.2*  HCT 35.6* 32.4* 34.1* 28.3*  MCV 95.7 94.5 96.1 95.9  PLT 236 196 200 186     Cardiac Enzymes: No results for input(s): CKTOTAL, CKMB, CKMBINDEX, TROPONINI in the last 168 hours.  BNP: Invalid input(s): POCBNP  CBG: Recent Labs  Lab 04/28/24 1225 04/28/24 1504 04/28/24 1726 04/28/24 2108 04/29/24 0801  GLUCAP 67* 160* 188* 156* 73    Microbiology: Results for orders placed or performed during the hospital encounter of 04/07/24  Resp panel by RT-PCR (RSV, Flu A&B, Covid) Anterior Nasal Swab     Status: None   Collection Time: 04/07/24  4:46 PM   Specimen: Anterior Nasal Swab  Result Value Ref Range Status   SARS Coronavirus 2 by RT PCR NEGATIVE NEGATIVE Final    Comment: (NOTE) SARS-CoV-2 target nucleic acids are NOT DETECTED.  The SARS-CoV-2 RNA is generally detectable in upper respiratory specimens during the acute phase of infection. The lowest concentration of SARS-CoV-2 viral copies this assay can detect is 138 copies/mL. A negative result does not preclude SARS-Cov-2 infection and should not be used as the sole basis for treatment or other patient management decisions. A negative result may occur with  improper specimen collection/handling, submission of specimen other than nasopharyngeal swab, presence of viral mutation(s) within the areas targeted by this assay, and inadequate number of viral copies(<138 copies/mL). A negative result must be combined  with clinical observations, patient history, and epidemiological information. The expected result is Negative.  Fact Sheet for Patients:  BloggerCourse.com  Fact Sheet for Healthcare Providers:  SeriousBroker.it  This test is no t yet approved or cleared by the [place]  FDA and  has been authorized for detection and/or diagnosis of SARS-CoV-2 by FDA under an Emergency Use Authorization (EUA). This EUA will remain  in effect (meaning this test can be used) for the duration of the COVID-19 declaration under Section  564(b)(1) of the Act, 21 U.S.C.section 360bbb-3(b)(1), unless the authorization is terminated  or revoked sooner.       Influenza A by PCR NEGATIVE NEGATIVE Final   Influenza B by PCR NEGATIVE NEGATIVE Final    Comment: (NOTE) The Xpert Xpress SARS-CoV-2/FLU/RSV plus assay is intended as an aid in the diagnosis of influenza from Nasopharyngeal swab specimens and should not be used as a sole basis for treatment. Nasal washings and aspirates are unacceptable for Xpert Xpress SARS-CoV-2/FLU/RSV testing.  Fact Sheet for Patients: BloggerCourse.com  Fact Sheet for Healthcare Providers: SeriousBroker.it  This test is not yet approved or cleared by the [place]  FDA and has been authorized for detection and/or diagnosis of SARS-CoV-2 by FDA under an Emergency Use Authorization (EUA). This EUA will remain in effect (meaning this test can be used) for the duration of the COVID-19 declaration under Section 564(b)(1) of the Act, 21 U.S.C. section 360bbb-3(b)(1), unless the authorization is terminated or revoked.     Resp Syncytial Virus by PCR NEGATIVE NEGATIVE Final    Comment: (NOTE) Fact Sheet for Patients: BloggerCourse.com  Fact Sheet for Healthcare Providers: SeriousBroker.it  This test is not yet approved or cleared by the [place]  FDA and has been authorized for detection and/or diagnosis of SARS-CoV-2 by FDA under an Emergency Use Authorization (EUA). This EUA will remain in effect (meaning this test can be used) for the duration of the COVID-19 declaration under Section 564(b)(1) of the Act, 21 U.S.C. section 360bbb-3(b)(1), unless the authorization is terminated or revoked.  Performed at [facility], [address] [place], [place] [address]     Coagulation Studies: No results for input(s): LABPROT, INR in the last 72 hours.  Urinalysis: No  results for input(s): COLORURINE, LABSPEC, PHURINE, GLUCOSEU, HGBUR, BILIRUBINUR, KETONESUR, PROTEINUR, UROBILINOGEN, NITRITE, LEUKOCYTESUR in the last 72 hours.  Invalid input(s): APPERANCEUR    Imaging: No results found.   Medications:     bisacodyl   10 mg Oral QHS   buPROPion   300 mg Oral Daily   calcitRIOL   0.25 mcg Oral Daily   Chlorhexidine  Gluconate Cloth  6 each Topical Daily   fludrocortisone   0.2 mg Oral Daily   heparin   5,000 Units Subcutaneous Q8H   hydrocortisone   50 mg Oral Daily   Followed by   NOREEN ON 04/30/2024] hydrocortisone   40 mg Oral Daily   Followed by   NOREEN ON 05/01/2024] hydrocortisone   30 mg Oral Daily   Followed by   NOREEN ON 05/02/2024] hydrocortisone   20 mg Oral Daily   Followed by   NOREEN ON 05/03/2024] hydrocortisone   10 mg Oral Daily   hydrocortisone   25 mg Oral QPM   Followed by   NOREEN ON 04/30/2024] hydrocortisone   20 mg Oral QPM   Followed by   NOREEN ON 05/01/2024] hydrocortisone   15 mg Oral QPM   Followed by   NOREEN ON 05/02/2024] hydrocortisone   10 mg Oral QPM   Followed by   NOREEN ON 05/03/2024] hydrocortisone   5  mg Oral QPM   insulin  aspart  0-15 Units Subcutaneous TID WC   midodrine   10 mg Oral TID WC   pantoprazole   40 mg Oral Daily   polyethylene glycol  17 g Oral BID   sevelamer  carbonate  1,600 mg Oral TID WC   venlafaxine  XR  150 mg Oral Q breakfast   And   venlafaxine  XR  75 mg Oral Q breakfast   acetaminophen , bisacodyl , ondansetron  (ZOFRAN ) IV, ondansetron   Assessment/ Plan:  [name] is a 58 y.o.  male  with a PMHx of hypertension, atrial fibrillation, BPH, hyperlipidemia, anxiety, constipation, morbid obesity, and end stage renal disease on hemodialysis.    [facility] will need to resubmit for chair at discharge.    End stage renal disease on hemodialysis         Received dialysis yesterday, UF achieved. Next treatment scheduled for Tuesday.    2.  Acute metabolic  acidosis.  Serum bicarbonate 16 on admission. Will continue to manage with dialysis.   3.  Anemia of chronic kidney disease.  Hemoglobin 9.2 with last dialysis. Will order low dose Retacrit  with dialysis.     4.  Secondary hyperparathyroidism. PTH 199 during recent admission. Continue Renvela  2 tabs TID.   Calcium  9.6, phos 4.1 at last check  5. Severe orthostatic hypotension Workup so far has included 2D echo from 04/11/2024 which shows LVEF 65 to 70%, normal LV systolic function, mild concentric LVH, normal diastolic parameters, normal right ventricular systolic function.  A.m. cortisol level normal from 04/24/2024.  TSH normal.  Other differential includes amyloidosis.  Kidney biopsy or fat pad biopsy can be considered. Given steroids      LOS: 10 Mariaguadalupe Fialkowski 8/31/20259:54 AM

## 2024-04-29 NOTE — Progress Notes (Signed)
 PROGRESS NOTE    SHEILA GERVASI  FMW:[id number] DOB: Dec 22, 1965 DOA: 04/15/2024 PCP: Vicci Duwaine SQUIBB, DO  147A/147A-AA  LOS: 10 days   Brief hospital course:   ILYAS LIPSITZ is a 58 y.o. male with medical history significant of newly ESRD recently started on HD, atrial fibrillation, anxiety and depression, morbid obesity who presented with complaints of weakness and falls.   Pt was very frustrated with his medical and mental problems, and wanted us  to make him better.  Pt complained of fatigue, lack of energy, and weakness, causing to fall several times since he was discharged from hospital on 04/13/24.  No focal neuro deficits.  Pt said he has had long standing mental health issues and needs to see psych.  Pt complained of constipation for the past 2 months that wasn't addressed during his last hospitalization.     Of note, pt was hospitalized from 04/07/24 to 04/13/24 during which he was started on dialysis, and was discharged after dialysis on 8/15 to continue outpatient dialysis TTS.   ED Course: initial vitals: afebrile, pulse 105, BP 86/65, RR 12, sating 99% on room air.  Labs consistent with ESRD.  CXR no acute finding.  EKG showed sinus tachycardia.  CT head no acute finding.  ED provider reported pt said he would be better off not living at this point but does not have a plan for suicide and is going to receive help.  Psych consult placed.  ED provider contacted oncall nephro, who plans dialysis for pt tomorrow.  Assessment & Plan:  CLAUDY ABDALLAH is a 58 y.o. male with medical history significant of recently diagnosed ESRD now on HD, atrial fibrillation, anxiety and depression, morbid obesity who presented with complaints of weakness and falls.    # Orthostatic Hypotension Persistent, symptomatic when going from seated to standing position. No concern for infection. No known neurological conditions that could be contributing. AM cortisol WNL though drawn at 0500.  -- Continue  to hold home Toprol  due to low BP --Continue midodrine  10 mg TID --TED/compression hose abd binder  -- Increase florinef  0.2mg  every day --Fluid intake liberalized per nephro -- Florinef  and midodrine  remain ineffective need to consider alternative agent 8/28 stared steroids, Solu-Medrol  125 mg IV daily for 3 days, PPI for GI prophylaxis and insulin  sliding scale monitor CBG for hyperglycemia 8/30 patient felt improvement with the steroids, still has orthostasis, BP drops at standing for 3 minutes.  But no problem with ambulation. Started Cortef  50 mg in the morning and 25 mg in the evening, started tapering dose over 5 days. Hopefully patient will not need more steroids and blood pressure will improve in few days.  # Weakness and fall  --Fatigue and weakness have been progressive, due to recent hospitalization and decline in health.  CT head wnl, no acute finding. Noted to have elevated BUN (unclear of time line regarding rise in BUN). Also noted to have significant orthostatic hypotension. Suspect this is likely cause of fall.  --PT/OT recommending SNF 8/27 check autoantibodies to rule out any autoimmune disorder.    # ESRD on HD TTS Secondary hyperparathyroidism Calcitriol  deficiency  --recently started on dialysis --HD per nephro   # Normocytic anemia, mild  Likely anemia of chronic disease  Stable.  Iron panel within normal limits.  B12 within normal limits.  Folate within normal limits -ESA per nephrology     Latest Ref Rng & Units 04/28/2024    7:37 AM 04/26/2024    3:53  AM 04/24/2024    9:15 AM  CBC  WBC 4.0 - 10.5 K/uL 16.1  9.8  5.5   Hemoglobin 13.0 - 17.0 g/dL 9.2  88.8  89.3   Hematocrit 39.0 - 52.0 % 28.3  34.1  32.4   Platelets 150 - 400 K/uL 186  200  196     # Hx of Afib --pt not taking anticoagulation PTA.  Currently in sinus. --hold home Toprol  due to low BP   # Depression and hx of panic disorder # Passive SI --psych consulted --no need for SI  precaution, per psych --cont home bupropion  and Effexor   # Vitamin D  deficiency: Continue Calcitrol    # OSA --CPAP nightly   # Obesity, Class III, BMI 40-49.9 (morbid obesity)  Calorie restricted diet and daily exercise advised to lose body weight.  Lifestyle modification discussed.   DVT prophylaxis: Heparin  SQ Code Status: Full code  Family Communication:  Level of care: Med-Surg Dispo:   The patient is from: home Anticipated d/c is to: SNF rehab, patient is currently unhomed as he was evicted recently. TOC consulted. Patient remains hospitalized given persistent severe orthostatic hypotension  Anticipated d/c date is: whenever SNF accepts or shelter will be arranged by TOC   Subjective and Interval History:  Patient was seen and examined at bedside today. No any active issues overnight.  Still blood pressure is dropping when patient stands up, symptoms are gradually getting better, feels little bit dizziness but able to manage.    Objective: Vitals:   04/28/24 1618 04/28/24 2110 04/29/24 0518 04/29/24 0801  BP: 94/69 121/64 104/62 97/63  Pulse: 74 68 72 67  Resp: 17 20 18    Temp: 97.9 F (36.6 C) 98.5 F (36.9 C) 97.6 F (36.4 C) 97.7 F (36.5 C)  TempSrc: Oral  Oral   SpO2: 98% 97% 99% 100%  Weight:      Height:        Intake/Output Summary (Last 24 hours) at 04/29/2024 1151 Last data filed at 04/29/2024 0900 Gross per 24 hour  Intake 720 ml  Output 500 ml  Net 220 ml   Filed Weights   04/26/24 1133 04/28/24 0733 04/28/24 1152  Weight: 118.9 kg 124 kg 123.5 kg    Examination:    Constitutional: In no distress.  Cardiovascular: Normal rate, regular rhythm. No lower extremity edema  Pulmonary: Non labored breathing on room air, no wheezing or rales.   Abdominal: Soft. Non distended and non tender Musculoskeletal: Normal range of motion.     Neurological: Alert and oriented to person, place, and time. Non focal  Skin: Skin is warm and dry.      Data Reviewed: I have personally reviewed labs and imaging studies   Time spent: 35 minutes  Elvan Sor, MD [facility] If 7PM-7AM, please contact night-coverage 04/29/2024, 11:51 AM

## 2024-04-30 DIAGNOSIS — R531 Weakness: Secondary | ICD-10-CM | POA: Diagnosis not present

## 2024-04-30 LAB — GLUCOSE, CAPILLARY
Glucose-Capillary: 69 mg/dL — ABNORMAL LOW (ref 70–99)
Glucose-Capillary: 79 mg/dL (ref 70–99)
Glucose-Capillary: 79 mg/dL (ref 70–99)
Glucose-Capillary: 128 mg/dL — ABNORMAL HIGH (ref 70–99)
Glucose-Capillary: 170 mg/dL — ABNORMAL HIGH (ref 70–99)

## 2024-04-30 NOTE — Progress Notes (Signed)
 PROGRESS NOTE    Brady Edwards  FMW:[id number] DOB: [birth year] DOA: 04/15/2024 PCP: Vicci Duwaine SQUIBB, DO  147A/147A-AA  LOS: 11 days   Brief hospital course:   Brady Edwards is a 58 y.o. male with medical history significant of newly ESRD recently started on HD, atrial fibrillation, anxiety and depression, morbid obesity who presented with complaints of weakness and falls.   Pt was very frustrated with his medical and mental problems, and wanted us  to make him better.  Pt complained of fatigue, lack of energy, and weakness, causing to fall several times since he was discharged from hospital on 04/13/24.  No focal neuro deficits.  Pt said he has had long standing mental health issues and needs to see psych.  Pt complained of constipation for the past 2 months that wasn't addressed during his last hospitalization.     Of note, pt was hospitalized from 04/07/24 to 04/13/24 during which he was started on dialysis, and was discharged after dialysis on 8/15 to continue outpatient dialysis TTS.   ED Course: initial vitals: afebrile, pulse 105, BP 86/65, RR 12, sating 99% on room air.  Labs consistent with ESRD.  CXR no acute finding.  EKG showed sinus tachycardia.  CT head no acute finding.  ED provider reported pt said he would be better off not living at this point but does not have a plan for suicide and is going to receive help.  Psych consult placed.  ED provider contacted oncall nephro, who plans dialysis for pt tomorrow.  Assessment & Plan:  Brady Edwards is a 58 y.o. male with medical history significant of recently diagnosed ESRD now on HD, atrial fibrillation, anxiety and depression, morbid obesity who presented with complaints of weakness and falls.    # Orthostatic Hypotension Persistent, symptomatic when going from seated to standing position. No concern for infection. No known neurological conditions that could be contributing. AM cortisol WNL though drawn at 0500.  -- Continue  to hold home Toprol  due to low BP --Continue midodrine  10 mg TID --TED/compression hose abd binder  -- Increase florinef  0.2mg  every day --Fluid intake liberalized per nephro -- Florinef  and midodrine  remain ineffective need to consider alternative agent 8/28 stared steroids, Solu-Medrol  125 mg IV daily for 3 days, PPI for GI prophylaxis and insulin  sliding scale monitor CBG for hyperglycemia 8/30 patient felt improvement with the steroids, still has orthostasis, BP drops at standing for 3 minutes.  But no problem with ambulation. Started Cortef  50 mg in the morning and 25 mg in the evening, started tapering dose over 5 days. Hopefully patient will not need more steroids and blood pressure will improve in few days.  # Weakness and fall  --Fatigue and weakness have been progressive, due to recent hospitalization and decline in health.  CT head wnl, no acute finding. Noted to have elevated BUN (unclear of time line regarding rise in BUN). Also noted to have significant orthostatic hypotension. Suspect this is likely cause of fall.  --PT/OT recommending SNF 8/27 check autoantibodies to rule out any autoimmune disorder.    # ESRD on HD TTS Secondary hyperparathyroidism Calcitriol  deficiency  --recently started on dialysis --HD per nephro   # Normocytic anemia, mild  Likely anemia of chronic disease  Stable.  Iron panel within normal limits.  B12 within normal limits.  Folate within normal limits -ESA per nephrology     Latest Ref Rng & Units 04/28/2024    7:37 AM 04/26/2024    3:53  AM 04/24/2024    9:15 AM  CBC  WBC 4.0 - 10.5 K/uL 16.1  9.8  5.5   Hemoglobin 13.0 - 17.0 g/dL 9.2  88.8  89.3   Hematocrit 39.0 - 52.0 % 28.3  34.1  32.4   Platelets 150 - 400 K/uL 186  200  196     # Hx of Afib --pt not taking anticoagulation PTA.  Currently in sinus. --hold home Toprol  due to low BP   # Depression and hx of panic disorder # Passive SI --psych consulted --no need for SI  precaution, per psych --cont home bupropion  and Effexor   # Vitamin D  deficiency: Continue Calcitrol    # OSA --CPAP nightly   # Obesity, Class III, BMI 40-49.9 (morbid obesity)  Calorie restricted diet and daily exercise advised to lose body weight.  Lifestyle modification discussed.   DVT prophylaxis: Heparin  SQ Code Status: Full code  Family Communication:  Level of care: Med-Surg Dispo:   The patient is from: home Anticipated d/c is to: SNF rehab, patient is currently unhomed as he was evicted recently. TOC consulted. Patient remains hospitalized given persistent severe orthostatic hypotension  Anticipated d/c date is: whenever SNF accepts or shelter will be arranged by TOC   Subjective and Interval History:  Patient was seen and examined at bedside today. No any active issues overnight.  Blood pressure is still fluctuating, patient feels dizziness while standing up. We will continue current treatment and follow along.    Objective: Vitals:   04/29/24 1530 04/29/24 2145 04/30/24 0351 04/30/24 0755  BP: 95/84 125/77 (!) 98/53 105/67  Pulse: 70 66 67 66  Resp: 18 16 16 18   Temp: 97.9 F (36.6 C) 98.4 F (36.9 C) (!) 97.5 F (36.4 C) 98 F (36.7 C)  TempSrc:   Oral Oral  SpO2: 97% 98% 98% 99%  Weight:      Height:        Intake/Output Summary (Last 24 hours) at 04/30/2024 1327 Last data filed at 04/30/2024 0900 Gross per 24 hour  Intake 720 ml  Output --  Net 720 ml   Filed Weights   04/26/24 1133 04/28/24 0733 04/28/24 1152  Weight: 118.9 kg 124 kg 123.5 kg    Examination:    Constitutional: In no distress.  Cardiovascular: Normal rate, regular rhythm. No lower extremity edema  Pulmonary: Non labored breathing on room air, no wheezing or rales.   Abdominal: Soft. Non distended and non tender Musculoskeletal: Normal range of motion.     Neurological: Alert and oriented to person, place, and time. Non focal  Skin: Skin is warm and dry.     Data  Reviewed: I have personally reviewed labs and imaging studies   Time spent: 35 minutes  Elvan Sor, MD [facility] If 7PM-7AM, please contact night-coverage 04/30/2024, 1:27 PM

## 2024-04-30 NOTE — Plan of Care (Signed)
   Problem: Education: Goal: Knowledge of General Education information will improve Description: Including pain rating scale, medication(s)/side effects and non-pharmacologic comfort measures Outcome: Progressing   Problem: Pain Managment: Goal: General experience of comfort will improve and/or be controlled Outcome: Progressing   Problem: Safety: Goal: Ability to remain free from injury will improve Outcome: Progressing

## 2024-04-30 NOTE — Progress Notes (Signed)
 9244 Informed by NT that BS is 69. Pt given OJ and is eating breakfast  0817 BS 79

## 2024-04-30 NOTE — Progress Notes (Signed)
 [facility]  ROUNDING NOTE   Subjective:   Patient seen laying in bed Alert Denies dizziness Was able to ambulate yesterday   Objective:  Vital signs in last 24 hours:  Temp:  [97.5 F (36.4 C)-98.4 F (36.9 C)] 98 F (36.7 C) (09/01 0755) Pulse Rate:  [66-70] 66 (09/01 0755) Resp:  [16-18] 18 (09/01 0755) BP: (95-125)/(53-84) 105/67 (09/01 0755) SpO2:  [97 %-99 %] 99 % (09/01 0755)  Weight change:  Filed Weights   04/26/24 1133 04/28/24 0733 04/28/24 1152  Weight: 118.9 kg 124 kg 123.5 kg    Intake/Output: I/O last 3 completed shifts: In: 600 [P.O.:600] Out: -    Intake/Output this shift:  Total I/O In: 480 [P.O.:480] Out: -   Physical Exam: General: NAD, missing teeth, large habitus  Head: Normocephalic  Eyes: Anicteric  Lungs:  Clear, room air  Heart: Regular rate  Abdomen:  Soft, distended   Extremities:  No peripheral edema.  Neurologic: Alert, awake  Skin: No lesions  Access: Rt chest permcath    Basic Metabolic Panel: Recent Labs  Lab 04/24/24 0915 04/26/24 0353 04/28/24 0737  NA 135 136 133*  K 3.7 4.0 4.2  CL 95* 96* 91*  CO2 24 28 26   GLUCOSE 91 102* 87  BUN 44* 22* 33*  CREATININE 11.49* 9.09* 9.88*  CALCIUM  9.3 9.7 9.6  MG  --  2.3  --   PHOS 5.8* 4.0 4.1    Liver Function Tests: Recent Labs  Lab 04/24/24 0915 04/28/24 0737  ALBUMIN  3.7 3.6   No results for input(s): LIPASE, AMYLASE in the last 168 hours. No results for input(s): AMMONIA in the last 168 hours.   CBC: Recent Labs  Lab 04/24/24 0915 04/26/24 0353 04/28/24 0737  WBC 5.5 9.8 16.1*  NEUTROABS 2.6  --   --   HGB 10.6* 11.1* 9.2*  HCT 32.4* 34.1* 28.3*  MCV 94.5 96.1 95.9  PLT 196 200 186    Cardiac Enzymes: No results for input(s): CKTOTAL, CKMB, CKMBINDEX, TROPONINI in the last 168 hours.  BNP: Invalid input(s): POCBNP  CBG: Recent Labs  Lab 04/29/24 1200 04/29/24 1625 04/29/24 2146 04/30/24 0754  04/30/24 0817  GLUCAP 85 109* 112* 69* 79    Microbiology: Results for orders placed or performed during the hospital encounter of 04/07/24  Resp panel by RT-PCR (RSV, Flu A&B, Covid) Anterior Nasal Swab     Status: None   Collection Time: 04/07/24  4:46 PM   Specimen: Anterior Nasal Swab  Result Value Ref Range Status   SARS Coronavirus 2 by RT PCR NEGATIVE NEGATIVE Final    Comment: (NOTE) SARS-CoV-2 target nucleic acids are NOT DETECTED.  The SARS-CoV-2 RNA is generally detectable in upper respiratory specimens during the acute phase of infection. The lowest concentration of SARS-CoV-2 viral copies this assay can detect is 138 copies/mL. A negative result does not preclude SARS-Cov-2 infection and should not be used as the sole basis for treatment or other patient management decisions. A negative result may occur with  improper specimen collection/handling, submission of specimen other than nasopharyngeal swab, presence of viral mutation(s) within the areas targeted by this assay, and inadequate number of viral copies(<138 copies/mL). A negative result must be combined with clinical observations, patient history, and epidemiological information. The expected result is Negative.  Fact Sheet for Patients:  BloggerCourse.com  Fact Sheet for Healthcare Providers:  SeriousBroker.it  This test is no t yet approved or cleared by the [place]  FDA and  has been authorized for detection and/or diagnosis of SARS-CoV-2 by FDA under an Emergency Use Authorization (EUA). This EUA will remain  in effect (meaning this test can be used) for the duration of the COVID-19 declaration under Section 564(b)(1) of the Act, 21 U.S.C.section 360bbb-3(b)(1), unless the authorization is terminated  or revoked sooner.       Influenza A by PCR NEGATIVE NEGATIVE Final   Influenza B by PCR NEGATIVE NEGATIVE Final    Comment: (NOTE) The Xpert  Xpress SARS-CoV-2/FLU/RSV plus assay is intended as an aid in the diagnosis of influenza from Nasopharyngeal swab specimens and should not be used as a sole basis for treatment. Nasal washings and aspirates are unacceptable for Xpert Xpress SARS-CoV-2/FLU/RSV testing.  Fact Sheet for Patients: BloggerCourse.com  Fact Sheet for Healthcare Providers: SeriousBroker.it  This test is not yet approved or cleared by the [place]  FDA and has been authorized for detection and/or diagnosis of SARS-CoV-2 by FDA under an Emergency Use Authorization (EUA). This EUA will remain in effect (meaning this test can be used) for the duration of the COVID-19 declaration under Section 564(b)(1) of the Act, 21 U.S.C. section 360bbb-3(b)(1), unless the authorization is terminated or revoked.     Resp Syncytial Virus by PCR NEGATIVE NEGATIVE Final    Comment: (NOTE) Fact Sheet for Patients: BloggerCourse.com  Fact Sheet for Healthcare Providers: SeriousBroker.it  This test is not yet approved or cleared by the [place]  FDA and has been authorized for detection and/or diagnosis of SARS-CoV-2 by FDA under an Emergency Use Authorization (EUA). This EUA will remain in effect (meaning this test can be used) for the duration of the COVID-19 declaration under Section 564(b)(1) of the Act, 21 U.S.C. section 360bbb-3(b)(1), unless the authorization is terminated or revoked.  Performed at [facility], [address] [place], [place] [address]     Coagulation Studies: No results for input(s): LABPROT, INR in the last 72 hours.  Urinalysis: No results for input(s): COLORURINE, LABSPEC, PHURINE, GLUCOSEU, HGBUR, BILIRUBINUR, KETONESUR, PROTEINUR, UROBILINOGEN, NITRITE, LEUKOCYTESUR in the last 72 hours.  Invalid input(s): APPERANCEUR    Imaging: No results  found.   Medications:     bisacodyl   10 mg Oral QHS   buPROPion   300 mg Oral Daily   calcitRIOL   0.25 mcg Oral Daily   Chlorhexidine  Gluconate Cloth  6 each Topical Daily   [START ON 05/01/2024] epoetin  alfa-epbx (RETACRIT ) injection  4,000 Units Intravenous Q T,Th,Sat-1800   fludrocortisone   0.2 mg Oral Daily   heparin   5,000 Units Subcutaneous Q8H   [START ON 05/01/2024] hydrocortisone   30 mg Oral Daily   Followed by   NOREEN ON 05/02/2024] hydrocortisone   20 mg Oral Daily   Followed by   NOREEN ON 05/03/2024] hydrocortisone   10 mg Oral Daily   hydrocortisone   20 mg Oral QPM   Followed by   NOREEN ON 05/01/2024] hydrocortisone   15 mg Oral QPM   Followed by   NOREEN ON 05/02/2024] hydrocortisone   10 mg Oral QPM   Followed by   NOREEN ON 05/03/2024] hydrocortisone   5 mg Oral QPM   insulin  aspart  0-15 Units Subcutaneous TID WC   midodrine   10 mg Oral TID WC   pantoprazole   40 mg Oral Daily   polyethylene glycol  17 g Oral BID   sevelamer  carbonate  1,600 mg Oral TID WC   venlafaxine  XR  150 mg Oral Q breakfast   And   venlafaxine  XR  75 mg Oral Q breakfast  acetaminophen , bisacodyl , ondansetron  (ZOFRAN ) IV, ondansetron   Assessment/ Plan:  Mr. ROMEO ZIELINSKI is a 58 y.o.  male  with a PMHx of hypertension, atrial fibrillation, BPH, hyperlipidemia, anxiety, constipation, morbid obesity, and end stage renal disease on hemodialysis.    [facility]- will need to resubmit for chair at discharge.    End stage renal disease on hemodialysis         Next treatment scheduled for Tuesday.    2.  Acute metabolic acidosis.  Serum bicarbonate 16 on admission. Managing with dialysis.   3.  Anemia of chronic kidney disease. Will check Hgb with dialysis tomorrow.     4.  Secondary hyperparathyroidism. PTH 199 during recent admission. Continue Renvela  2 tabs TID.   Calcium  9.6, phos 4.1 at last check. Will continue to monitor labs.   5. Severe orthostatic hypotension Workup so far  has included 2D echo from 04/11/2024 which shows LVEF 65 to 70%, normal LV systolic function, mild concentric LVH, normal diastolic parameters, normal right ventricular systolic function.  A.m. cortisol level normal from 04/24/2024.  TSH normal.  Other differential includes amyloidosis.  Kidney biopsy or fat pad biopsy can be considered. Given steroids. Continue TEDs, midodrine  and abd binders.      LOS: 11 Keani Gotcher 9/1/202510:43 AM

## 2024-04-30 NOTE — Plan of Care (Signed)

## 2024-05-01 LAB — CBC
HCT: 28.8 % — ABNORMAL LOW (ref 39.0–52.0)
Hemoglobin: 9.2 g/dL — ABNORMAL LOW (ref 13.0–17.0)
MCH: 31 pg (ref 26.0–34.0)
MCHC: 31.9 g/dL (ref 30.0–36.0)
MCV: 97 fL (ref 80.0–100.0)
Platelets: 229 K/uL (ref 150–400)
RBC: 2.97 MIL/uL — ABNORMAL LOW (ref 4.22–5.81)
RDW: 13.5 % (ref 11.5–15.5)
WBC: 8.4 K/uL (ref 4.0–10.5)
nRBC: 0 % (ref 0.0–0.2)

## 2024-05-01 LAB — RENAL FUNCTION PANEL
Albumin: 3.3 g/dL — ABNORMAL LOW (ref 3.5–5.0)
Anion gap: 12 (ref 5–15)
BUN: 46 mg/dL — ABNORMAL HIGH (ref 6–20)
CO2: 27 mmol/L (ref 22–32)
Calcium: 9 mg/dL (ref 8.9–10.3)
Chloride: 98 mmol/L (ref 98–111)
Creatinine, Ser: 11.02 mg/dL — ABNORMAL HIGH (ref 0.61–1.24)
GFR, Estimated: 5 mL/min — ABNORMAL LOW (ref >60)
Glucose, Bld: 77 mg/dL (ref 70–99)
Phosphorus: 2.6 mg/dL (ref 2.5–4.6)
Potassium: 4.4 mmol/L (ref 3.5–5.1)
Sodium: 137 mmol/L (ref 135–145)

## 2024-05-01 LAB — GLUCOSE, CAPILLARY
Glucose-Capillary: 95 mg/dL (ref 70–99)
Glucose-Capillary: 96 mg/dL (ref 70–99)
Glucose-Capillary: 159 mg/dL — ABNORMAL HIGH (ref 70–99)

## 2024-05-01 MED ORDER — HEPARIN SODIUM (PORCINE) 1000 UNIT/ML DIALYSIS: 1000.0000 [IU] | INTRAMUSCULAR | Status: DC | PRN

## 2024-05-01 MED ORDER — ALTEPLASE 2 MG IJ SOLR: 2.0000 mg | Freq: Once | INTRAMUSCULAR | Status: DC | PRN

## 2024-05-01 MED ORDER — EPOETIN ALFA-EPBX 4000 UNIT/ML IJ SOLN
4000.0000 [IU] | INTRAMUSCULAR | Status: DC
  Administered 2024-05-01 – 2024-05-29 (×15): 4000 [IU] via INTRAVENOUS
  Filled 2024-05-01 (×6): qty 1

## 2024-05-01 MED ORDER — EPOETIN ALFA-EPBX 4000 UNIT/ML IJ SOLN: 4000.0000 [IU] | INTRAMUSCULAR | Status: DC

## 2024-05-01 MED ORDER — HEPARIN SODIUM (PORCINE) 1000 UNIT/ML IJ SOLN
INTRAMUSCULAR | Status: AC
  Filled 2024-05-01: qty 2

## 2024-05-01 MED ORDER — EPOETIN ALFA-EPBX 4000 UNIT/ML IJ SOLN
INTRAMUSCULAR | Status: AC
  Filled 2024-05-01: qty 1

## 2024-05-01 MED ORDER — HEPARIN SODIUM (PORCINE) 1000 UNIT/ML DIALYSIS
1000.0000 [IU] | INTRAMUSCULAR | Status: DC | PRN
  Administered 2024-05-01: 1000 [IU]

## 2024-05-01 MED ORDER — HEPARIN SODIUM (PORCINE) 1000 UNIT/ML IJ SOLN
INTRAMUSCULAR | Status: AC
  Filled 2024-05-01: qty 5

## 2024-05-01 NOTE — Progress Notes (Signed)
  Received patient in bed to unit.   Informed consent signed and in chart.    TX duration:     Transported by  Hand-off given to patient's nurse.    Access used: Right internal jugular  Access issues: High venous pressures   Total UF removed: 1200 l Medication(s) given: Epo and Heparin  Post HD VS: wnl Post HD weight: 121.9 kg     N. Savahanna Almendariz LPN [facility]

## 2024-05-01 NOTE — Progress Notes (Signed)
 PROGRESS NOTE    GREGOIRE BENNIS  FMW:[id number] DOB: 1966-07-26 DOA: 04/15/2024 PCP: Vicci Duwaine SQUIBB, DO  147A/147A-AA  LOS: 12 days   Brief hospital course:   BARTHOLOMEW RAMESH is a 58 y.o. male with medical history significant of newly ESRD recently started on HD, atrial fibrillation, anxiety and depression, morbid obesity who presented with complaints of weakness and falls.   Pt was very frustrated with his medical and mental problems, and wanted us  to make him better.  Pt complained of fatigue, lack of energy, and weakness, causing to fall several times since he was discharged from hospital on 04/13/24.  No focal neuro deficits.  Pt said he has had long standing mental health issues and needs to see psych.  Pt complained of constipation for the past 2 months that wasn't addressed during his last hospitalization.     Of note, pt was hospitalized from 04/07/24 to 04/13/24 during which he was started on dialysis, and was discharged after dialysis on 8/15 to continue outpatient dialysis TTS.   ED Course: initial vitals: afebrile, pulse 105, BP 86/65, RR 12, sating 99% on room air.  Labs consistent with ESRD.  CXR no acute finding.  EKG showed sinus tachycardia.  CT head no acute finding.  ED provider reported pt said he would be better off not living at this point but does not have a plan for suicide and is going to receive help.  Psych consult placed.  ED provider contacted oncall nephro, who plans dialysis for pt tomorrow.  Assessment & Plan:  BRIXTON FRANKO is a 58 y.o. male with medical history significant of recently diagnosed ESRD now on HD, atrial fibrillation, anxiety and depression, morbid obesity who presented with complaints of weakness and falls.    # Orthostatic Hypotension Persistent, symptomatic when going from seated to standing position. No concern for infection. No known neurological conditions that could be contributing. AM cortisol WNL though drawn at 0500.  -- Continue  to hold home Toprol  due to low BP --Continue midodrine  10 mg TID --TED/compression hose abd binder  -- Increase florinef  0.2mg  every day --Fluid intake liberalized per nephro -- Florinef  and midodrine  remain ineffective need to consider alternative agent 8/28 stared steroids, Solu-Medrol  125 mg IV daily for 3 days, PPI for GI prophylaxis and insulin  sliding scale monitor CBG for hyperglycemia 8/30 patient felt improvement with the steroids, still has orthostasis, BP drops at standing for 3 minutes.  But no problem with ambulation. Started Cortef  50 mg in the morning and 25 mg in the evening, started tapering dose over 5 days. Hopefully patient will not need more steroids and blood pressure will improve in few days. 9/2 blood pressure is improving, RN was advised to hold off midodrine  and Florinef  if patient does not needed, follow parameters   # Weakness and fall  --Fatigue and weakness have been progressive, due to recent hospitalization and decline in health.  CT head wnl, no acute finding. Noted to have elevated BUN (unclear of time line regarding rise in BUN). Also noted to have significant orthostatic hypotension. Suspect this is likely cause of fall.  --PT/OT recommending SNF 8/27 acetylcholine antibodies negative 8/27 check autoantibodies to rule out any autoimmune disorder.    # ESRD on HD TTS Secondary hyperparathyroidism Calcitriol  deficiency  --recently started on dialysis --HD per nephro   # Normocytic anemia, mild  Likely anemia of chronic disease  Stable.  Iron panel within normal limits.  B12 within normal limits.  Folate  within normal limits -ESA per nephrology     Latest Ref Rng & Units 05/01/2024    8:14 AM 04/28/2024    7:37 AM 04/26/2024    3:53 AM  CBC  WBC 4.0 - 10.5 K/uL 8.4  16.1  9.8   Hemoglobin 13.0 - 17.0 g/dL 9.2  9.2  88.8   Hematocrit 39.0 - 52.0 % 28.8  28.3  34.1   Platelets 150 - 400 K/uL 229  186  200     # Hx of Afib --pt not taking  anticoagulation PTA.  Currently in sinus. --hold home Toprol  due to low BP   # Depression and hx of panic disorder # Passive SI --psych consulted --no need for SI precaution, per psych --cont home bupropion  and Effexor   # Vitamin D  deficiency: Continue Calcitrol    # OSA --CPAP nightly   # Obesity, Class III, BMI 40-49.9 (morbid obesity)  Calorie restricted diet and daily exercise advised to lose body weight.  Lifestyle modification discussed.   DVT prophylaxis: Heparin  SQ Code Status: Full code  Family Communication:  Level of care: Med-Surg Dispo:   The patient is from: home Anticipated d/c is to: SNF rehab, patient is currently unhomed as he was evicted recently. TOC consulted. Patient remains hospitalized given persistent severe orthostatic hypotension  Anticipated d/c date is: whenever TOC find a place for him   Subjective and Interval History:  Patient was seen and examined at bedside today. No any active issues overnight.  Blood pressure is improving, patient was seen during hemodialysis.    Objective: Vitals:   05/01/24 1226 05/01/24 1230 05/01/24 1250 05/01/24 1303  BP: (!) 103/53 (!) 103/53  120/80  Pulse: 77 77  73  Resp: 20 20  18   Temp: 97.8 F (36.6 C)   97.8 F (36.6 C)  TempSrc: Oral   Oral  SpO2: 99% 98%  100%  Weight:   121.9 kg   Height:        Intake/Output Summary (Last 24 hours) at 05/01/2024 1442 Last data filed at 05/01/2024 1230 Gross per 24 hour  Intake 120 ml  Output 1200 ml  Net -1080 ml   Filed Weights   04/28/24 1152 05/01/24 0735 05/01/24 1250  Weight: 123.5 kg 123.1 kg 121.9 kg    Examination:    Constitutional: In no distress.  Cardiovascular: Normal rate, regular rhythm. No lower extremity edema  Pulmonary: Non labored breathing on room air, no wheezing or rales.   Abdominal: Soft. Non distended and non tender Musculoskeletal: Normal range of motion.     Neurological: Alert and oriented to person, place, and time. Non  focal  Skin: Skin is warm and dry.     Data Reviewed: I have personally reviewed labs and imaging studies   Time spent: 35 minutes  Elvan Sor, MD [facility] If 7PM-7AM, please contact night-coverage 05/01/2024, 2:42 PM

## 2024-05-01 NOTE — Progress Notes (Signed)
 Mobility Specialist Progress Note:    05/01/24 1420  Mobility  Activity Dangled on edge of bed  Level of Assistance Independent  Assistive Device None  Range of Motion/Exercises Active;All extremities  Activity Response Tolerated well  Mobility visit 1 Mobility  Mobility Specialist Start Time (ACUTE ONLY) 1401  Mobility Specialist Stop Time (ACUTE ONLY) 1417  Mobility Specialist Time Calculation (min) (ACUTE ONLY) 16 min   Pt received in bed, agreeable to mobility. Independent with bed mobility. Upon sitting EOB abdominal binder was placed, HR 144 bpm. Pt denies dizziness and lightheadedness. C/o SOB, SpO2 99% on RA. Sat EOB for 3 minutes, HR still elevated at 130 bpm. Deferred ambulation and returned pt supine. Alarm on, notified RN, all needs met.   Sherrilee Ditty Mobility Specialist Please contact via Special educational needs teacher or  Rehab office at [phone]

## 2024-05-01 NOTE — Progress Notes (Signed)
   05/01/24 1303  Vitals  Temp 97.8 F (36.6 C)  Temp Source Oral  BP 120/80  MAP (mmHg) 92  BP Location Left Arm  BP Method Automatic  Patient Position (if appropriate) Lying  Pulse Rate 73  Pulse Rate Source Monitor  Resp 18  MEWS COLOR  MEWS Score Color Green  Oxygen Therapy  SpO2 100 %  O2 Device Room Air  MEWS Score  MEWS Temp 0  MEWS Systolic 0  MEWS Pulse 0  MEWS RR 0  MEWS LOC 0  MEWS Score 0   Patient back to room from Dialysis alert on room air.  Plan of care continues.

## 2024-05-01 NOTE — Progress Notes (Signed)
 [facility]  ROUNDING NOTE   Subjective:   Patient seen and evaluated during dialysis   HEMODIALYSIS FLOWSHEET:  Blood Flow Rate (mL/min): 249 mL/min Arterial Pressure (mmHg): -107.87 mmHg Venous Pressure (mmHg): 269.68 mmHg TMP (mmHg): 14.95 mmHg Ultrafiltration Rate (mL/min): 677 mL/min Dialysate Flow Rate (mL/min): 300 ml/min Dialysis Fluid Bolus: Albumin  Bolus Amount (mL): 100 mL  Tolerating treatment well seated in chair   Objective:  Vital signs in last 24 hours:  Temp:  [97.8 F (36.6 C)-98.9 F (37.2 C)] 97.8 F (36.6 C) (09/02 0743) Pulse Rate:  [58-85] 59 (09/02 1000) Resp:  [12-18] 16 (09/02 1000) BP: (83-126)/(45-84) 117/61 (09/02 1000) SpO2:  [93 %-100 %] 99 % (09/02 1000) Weight:  [123.1 kg] 123.1 kg (09/02 0735)  Weight change:  Filed Weights   04/28/24 0733 04/28/24 1152 05/01/24 0735  Weight: 124 kg 123.5 kg 123.1 kg    Intake/Output: I/O last 3 completed shifts: In: 600 [P.O.:600] Out: -    Intake/Output this shift:  No intake/output data recorded.  Physical Exam: General: NAD, missing teeth, large habitus  Head: Normocephalic  Eyes: Anicteric  Lungs:  Clear, room air  Heart: Regular rate  Abdomen:  Soft, distended   Extremities:  No peripheral edema.  Neurologic: Alert, awake  Skin: No lesions  Access: Rt chest permcath    Basic Metabolic Panel: Recent Labs  Lab 04/26/24 0353 04/28/24 0737 05/01/24 0814  NA 136 133* 137  K 4.0 4.2 4.4  CL 96* 91* 98  CO2 28 26 27   GLUCOSE 102* 87 77  BUN 22* 33* 46*  CREATININE 9.09* 9.88* 11.02*  CALCIUM  9.7 9.6 9.0  MG 2.3  --   --   PHOS 4.0 4.1 2.6    Liver Function Tests: Recent Labs  Lab 04/28/24 0737 05/01/24 0814  ALBUMIN  3.6 3.3*   No results for input(s): LIPASE, AMYLASE in the last 168 hours. No results for input(s): AMMONIA in the last 168 hours.   CBC: Recent Labs  Lab 04/26/24 0353 04/28/24 0737 05/01/24 0814  WBC 9.8 16.1* 8.4  HGB  11.1* 9.2* 9.2*  HCT 34.1* 28.3* 28.8*  MCV 96.1 95.9 97.0  PLT 200 186 229    Cardiac Enzymes: No results for input(s): CKTOTAL, CKMB, CKMBINDEX, TROPONINI in the last 168 hours.  BNP: Invalid input(s): POCBNP  CBG: Recent Labs  Lab 04/30/24 0754 04/30/24 0817 04/30/24 1201 04/30/24 1709 04/30/24 2129  GLUCAP 69* 79 79 128* 170*    Microbiology: Results for orders placed or performed during the hospital encounter of 04/07/24  Resp panel by RT-PCR (RSV, Flu A&B, Covid) Anterior Nasal Swab     Status: None   Collection Time: 04/07/24  4:46 PM   Specimen: Anterior Nasal Swab  Result Value Ref Range Status   SARS Coronavirus 2 by RT PCR NEGATIVE NEGATIVE Final    Comment: (NOTE) SARS-CoV-2 target nucleic acids are NOT DETECTED.  The SARS-CoV-2 RNA is generally detectable in upper respiratory specimens during the acute phase of infection. The lowest concentration of SARS-CoV-2 viral copies this assay can detect is 138 copies/mL. A negative result does not preclude SARS-Cov-2 infection and should not be used as the sole basis for treatment or other patient management decisions. A negative result may occur with  improper specimen collection/handling, submission of specimen other than nasopharyngeal swab, presence of viral mutation(s) within the areas targeted by this assay, and inadequate number of viral copies(<138 copies/mL). A negative result must be combined with clinical observations, patient  history, and epidemiological information. The expected result is Negative.  Fact Sheet for Patients:  BloggerCourse.com  Fact Sheet for Healthcare Providers:  SeriousBroker.it  This test is no t yet approved or cleared by the [place]  FDA and  has been authorized for detection and/or diagnosis of SARS-CoV-2 by FDA under an Emergency Use Authorization (EUA). This EUA will remain  in effect (meaning this test can  be used) for the duration of the COVID-19 declaration under Section 564(b)(1) of the Act, 21 U.S.C.section 360bbb-3(b)(1), unless the authorization is terminated  or revoked sooner.       Influenza A by PCR NEGATIVE NEGATIVE Final   Influenza B by PCR NEGATIVE NEGATIVE Final    Comment: (NOTE) The Xpert Xpress SARS-CoV-2/FLU/RSV plus assay is intended as an aid in the diagnosis of influenza from Nasopharyngeal swab specimens and should not be used as a sole basis for treatment. Nasal washings and aspirates are unacceptable for Xpert Xpress SARS-CoV-2/FLU/RSV testing.  Fact Sheet for Patients: BloggerCourse.com  Fact Sheet for Healthcare Providers: SeriousBroker.it  This test is not yet approved or cleared by the [place]  FDA and has been authorized for detection and/or diagnosis of SARS-CoV-2 by FDA under an Emergency Use Authorization (EUA). This EUA will remain in effect (meaning this test can be used) for the duration of the COVID-19 declaration under Section 564(b)(1) of the Act, 21 U.S.C. section 360bbb-3(b)(1), unless the authorization is terminated or revoked.     Resp Syncytial Virus by PCR NEGATIVE NEGATIVE Final    Comment: (NOTE) Fact Sheet for Patients: BloggerCourse.com  Fact Sheet for Healthcare Providers: SeriousBroker.it  This test is not yet approved or cleared by the [place]  FDA and has been authorized for detection and/or diagnosis of SARS-CoV-2 by FDA under an Emergency Use Authorization (EUA). This EUA will remain in effect (meaning this test can be used) for the duration of the COVID-19 declaration under Section 564(b)(1) of the Act, 21 U.S.C. section 360bbb-3(b)(1), unless the authorization is terminated or revoked.  Performed at [facility], [address] [place], [place] [address]     Coagulation Studies: No results for  input(s): LABPROT, INR in the last 72 hours.  Urinalysis: No results for input(s): COLORURINE, LABSPEC, PHURINE, GLUCOSEU, HGBUR, BILIRUBINUR, KETONESUR, PROTEINUR, UROBILINOGEN, NITRITE, LEUKOCYTESUR in the last 72 hours.  Invalid input(s): APPERANCEUR    Imaging: No results found.   Medications:     bisacodyl   10 mg Oral QHS   buPROPion   300 mg Oral Daily   calcitRIOL   0.25 mcg Oral Daily   Chlorhexidine  Gluconate Cloth  6 each Topical Daily   epoetin  alfa-epbx (RETACRIT ) injection  4,000 Units Intravenous Q T,Th,Sat-1800   fludrocortisone   0.2 mg Oral Daily   heparin   5,000 Units Subcutaneous Q8H   hydrocortisone   30 mg Oral Daily   Followed by   NOREEN ON 05/02/2024] hydrocortisone   20 mg Oral Daily   Followed by   NOREEN ON 05/03/2024] hydrocortisone   10 mg Oral Daily   hydrocortisone   15 mg Oral QPM   Followed by   NOREEN ON 05/02/2024] hydrocortisone   10 mg Oral QPM   Followed by   NOREEN ON 05/03/2024] hydrocortisone   5 mg Oral QPM   insulin  aspart  0-15 Units Subcutaneous TID WC   midodrine   10 mg Oral TID WC   pantoprazole   40 mg Oral Daily   polyethylene glycol  17 g Oral BID   sevelamer  carbonate  1,600 mg Oral TID WC   venlafaxine   XR  150 mg Oral Q breakfast   And   venlafaxine  XR  75 mg Oral Q breakfast   acetaminophen , bisacodyl , heparin , ondansetron  (ZOFRAN ) IV, ondansetron   Assessment/ Plan:  Brady Edwards is a 58 y.o.  male  with a PMHx of hypertension, atrial fibrillation, BPH, hyperlipidemia, anxiety, constipation, morbid obesity, and end stage renal disease on hemodialysis.    [facility]- will need to resubmit for chair at discharge.    End stage renal disease on hemodialysis         Receiving dialysis today, UF 0.5-1L as tolerated. Next treatment scheduled for Thursday.    2.  Acute metabolic acidosis.  Serum bicarbonate 16 on admission. Managing with dialysis.   3.  Anemia of chronic kidney disease. Hgb  9.2, will order retacrit  4000 units with dialysis.     4.  Secondary hyperparathyroidism. PTH 199 during recent admission. Continue Renvela  2 tabs TID.   Will continue to monitor labs.   5. Severe orthostatic hypotension Workup so far has included 2D echo from 04/11/2024 which shows LVEF 65 to 70%, normal LV systolic function, mild concentric LVH, normal diastolic parameters, normal right ventricular systolic function.  A.m. cortisol level normal from 04/24/2024.  TSH normal.  Other differential includes amyloidosis.  Kidney biopsy or fat pad biopsy can be considered. Given steroids. Continue TEDs, midodrine  and abd binders.      LOS: 12 Alexey Rhoads 9/2/202510:37 AM

## 2024-05-02 LAB — GLUCOSE, CAPILLARY
Glucose-Capillary: 80 mg/dL (ref 70–99)
Glucose-Capillary: 88 mg/dL (ref 70–99)
Glucose-Capillary: 94 mg/dL (ref 70–99)
Glucose-Capillary: 133 mg/dL — ABNORMAL HIGH (ref 70–99)

## 2024-05-02 NOTE — Progress Notes (Signed)
 Physical Therapy Treatment Patient Details Name: Brady Edwards MRN: [id number] DOB: 13-Jul-1966 Today's Date: 05/02/2024   History of Present Illness Brady Edwards is a 58 y.o. male with pmh of hypertension, atrial fibrillation on metoprolol  not on anticoagulation, BPH, hyperlipidemia, anxiety, constipation, morbid obesity recently admitted from 8/9-8/15/25 recently initiated on dialysis who presents from home with general weakness, frequent falls since discharge, shortness of breath and passive suicidal ideation.    PT Comments  Patient received in bed, he reports he is feeling good, got up to bathroom earlier. Assessed Orthostatics prior to session with significant orthostatic hypotension in standing and patient symptomatic. He does not have TED hose or abdominal binder on. Patient is unable to progress mobility at this time with standing BP of 55/21. He will continue to benefit from skilled PT to improve mobility and independence.      If plan is discharge home, recommend the following: A little help with walking and/or transfers;Help with stairs or ramp for entrance;Direct supervision/assist for medications management;A little help with bathing/dressing/bathroom   Can travel by private vehicle     Yes  Equipment Recommendations  None recommended by PT    Recommendations for Other Services       Precautions / Restrictions Precautions Precautions: Fall Recall of Precautions/Restrictions: Intact Precaution/Restrictions Comments: Monitor BP Restrictions Weight Bearing Restrictions Per Provider Order: No     Mobility  Bed Mobility Overal bed mobility: Independent                  Transfers Overall transfer level: Independent Equipment used: None Transfers: Sit to/from Stand Sit to Stand: Independent                Ambulation/Gait               General Gait Details: unable due to low BP   Stairs             Wheelchair Mobility     Tilt  Bed    Modified Rankin (Stroke Patients Only)       Balance Overall balance assessment: Independent Sitting-balance support: Feet supported Sitting balance-Leahy Scale: Normal     Standing balance support: No upper extremity supported Standing balance-Leahy Scale: Normal                              Communication Communication Communication: No apparent difficulties  Cognition Arousal: Alert Behavior During Therapy: WFL for tasks assessed/performed, Impulsive   PT - Cognitive impairments: No apparent impairments                         Following commands: Intact      Cueing Cueing Techniques: Verbal cues  Exercises Other Exercises Other Exercises: encouraging patient to perform LE exercises while in bed/seated.    General Comments        Pertinent Vitals/Pain Pain Assessment Pain Assessment: No/denies pain    Home Living                          Prior Function            PT Goals (current goals can now be found in the care plan section) Acute Rehab PT Goals Patient Stated Goal: improve independence; find out his medical problems PT Goal Formulation: With patient Time For Goal Achievement: 05/14/24 Potential to Achieve Goals: Fair Progress towards PT goals:  Not progressing toward goals - comment    Frequency    Min 1X/week      PT Plan      Co-evaluation              AM-PAC PT 6 Clicks Mobility   Outcome Measure  Help needed turning from your back to your side while in a flat bed without using bedrails?: None Help needed moving from lying on your back to sitting on the side of a flat bed without using bedrails?: None Help needed moving to and from a bed to a chair (including a wheelchair)?: A Little Help needed standing up from a chair using your arms (e.g., wheelchair or bedside chair)?: None Help needed to walk in hospital room?: A Little Help needed climbing 3-5 steps with a railing? : A Little 6  Click Score: 21    End of Session   Activity Tolerance: Treatment limited secondary to medical complications (Comment) (orthostatic) Patient left: in bed;with bed alarm set;with call bell/phone within reach Nurse Communication: Mobility status PT Visit Diagnosis: Difficulty in walking, not elsewhere classified (R26.2);Dizziness and giddiness (R42)     Time: 8996-8981 PT Time Calculation (min) (ACUTE ONLY): 15 min  Charges:    $Therapeutic Activity: 8-22 mins PT General Charges $$ ACUTE PT VISIT: 1 Visit                     Bernarr Longsworth, PT, GCS 05/02/24,11:13 AM

## 2024-05-02 NOTE — Progress Notes (Signed)
 PROGRESS NOTE    Brady Edwards  FMW:[id number] DOB: 04-07-1966 DOA: 04/15/2024 PCP: Vicci Duwaine SQUIBB, DO  [id number]/[id number]-AA  LOS: 13 days   Brief hospital course:   Brady Edwards is a 58 y.o. male with medical history significant of newly ESRD recently started on HD, atrial fibrillation, anxiety and depression, morbid obesity who presented with complaints of weakness and falls.   Pt was very frustrated with his medical and mental problems, and wanted us  to make him better.  Pt complained of fatigue, lack of energy, and weakness, causing to fall several times since he was discharged from hospital on 04/13/24.  No focal neuro deficits.  Pt said he has had long standing mental health issues and needs to see psych.  Pt complained of constipation for the past 2 months that wasn't addressed during his last hospitalization.     Of note, pt was hospitalized from 04/07/24 to 04/13/24 during which he was started on dialysis, and was discharged after dialysis on 8/15 to continue outpatient dialysis TTS.   ED Course: initial vitals: afebrile, pulse 105, BP 86/65, RR 12, sating 99% on room air.  Labs consistent with ESRD.  CXR no acute finding.  EKG showed sinus tachycardia.  CT head no acute finding.  ED provider reported pt said he would be better off not living at this point but does not have a plan for suicide and is going to receive help.  Psych consult placed.  ED provider contacted oncall nephro, who plans dialysis for pt tomorrow.  Assessment & Plan:  Brady Edwards is a 58 y.o. male with medical history significant of recently diagnosed ESRD now on HD, atrial fibrillation, anxiety and depression, morbid obesity who presented with complaints of weakness and falls.    # Orthostatic Hypotension Persistent, symptomatic when going from seated to standing position. No concern for infection. No known neurological conditions that could be contributing. AM cortisol WNL though drawn at 0500.  -- Continue  to hold home Toprol  due to low BP --Continue midodrine  10 mg TID --TED/compression hose abd binder  -- Increase florinef  0.2mg  every day --Fluid intake liberalized per nephro -- Florinef  and midodrine  remain ineffective need to consider alternative agent 8/28 stared steroids, Solu-Medrol  125 mg IV daily for 3 days, PPI for GI prophylaxis and insulin  sliding scale monitor CBG for hyperglycemia 8/30 patient felt improvement with the steroids, still has orthostasis, BP drops at standing for 3 minutes.  But no problem with ambulation. Started Cortef  50 mg in the morning and 25 mg in the evening, started tapering dose over 5 days. Hopefully patient will not need more steroids and blood pressure will improve in few days. 9/2 blood pressure is improving, RN was advised to hold off midodrine  and Florinef  if patient does not needed, follow parameters 9/3 BP is fluctuating and pt was unable to walk in the morning while working with PT   # Weakness and fall  --Fatigue and weakness have been progressive, due to recent hospitalization and decline in health.  CT head wnl, no acute finding. Noted to have elevated BUN (unclear of time line regarding rise in BUN). Also noted to have significant orthostatic hypotension. Suspect this is likely cause of fall.  --PT/OT recommending SNF 8/27 acetylcholine antibodies negative 8/27 check autoantibodies to rule out any autoimmune disorder.    # ESRD on HD TTS Secondary hyperparathyroidism Calcitriol  deficiency  --recently started on dialysis --HD per nephro   # Normocytic anemia, mild  Likely anemia of  chronic disease  Stable.  Iron panel within normal limits.  B12 within normal limits.  Folate within normal limits -ESA per nephrology     Latest Ref Rng & Units 05/01/2024    8:14 AM 04/28/2024    7:37 AM 04/26/2024    3:53 AM  CBC  WBC 4.0 - 10.5 K/uL 8.4  16.1  9.8   Hemoglobin 13.0 - 17.0 g/dL 9.2  9.2  88.8   Hematocrit 39.0 - 52.0 % 28.8  28.3  34.1    Platelets 150 - 400 K/uL 229  186  200     # Hx of Afib --pt not taking anticoagulation PTA.  Currently in sinus. --hold home Toprol  due to low BP   # Depression and hx of panic disorder # Passive SI --psych consulted --no need for SI precaution, per psych --cont home bupropion  and Effexor   # Vitamin D  deficiency: Continue Calcitrol    # OSA --CPAP nightly   # Obesity, Class III, BMI 40-49.9 (morbid obesity)  Calorie restricted diet and daily exercise advised to lose body weight.  Lifestyle modification discussed.   DVT prophylaxis: Heparin  SQ Code Status: Full code  Family Communication:  Level of care: Med-Surg Dispo:   The patient is from: home Anticipated d/c is to: SNF rehab, patient is currently unhomed as he was evicted recently. TOC consulted. Patient remains hospitalized given persistent severe orthostatic hypotension  Anticipated d/c date is: whenever TOC find a place for him   Subjective and Interval History:  Patient was seen and examined at bedside today. No any active issues overnight.  Blood pressure dropped today morning while working with physical therapy. patient had no symptoms while resting in the bed but was symptomatic while standing up. PT will check BP again later on   Objective: Vitals:   05/01/24 1931 05/02/24 0352 05/02/24 0735 05/02/24 1451  BP: 115/69 (!) 94/53 103/69 124/78  Pulse: 89 78 (!) 101 77  Resp: 16 18 18    Temp: 98.6 F (37 C) 98.5 F (36.9 C)  98.4 F (36.9 C)  TempSrc: Oral     SpO2: 97% 97% 98% 96%  Weight:      Height:       No intake or output data in the 24 hours ending 05/02/24 1624  Filed Weights   04/28/24 1152 05/01/24 0735 05/01/24 1250  Weight: 123.5 kg 123.1 kg 121.9 kg    Examination:    Constitutional: In no distress.  Cardiovascular: Normal rate, regular rhythm. No lower extremity edema  Pulmonary: Non labored breathing on room air, no wheezing or rales.   Abdominal: Soft. Non distended and  non tender Musculoskeletal: Normal range of motion.     Neurological: Alert and oriented to person, place, and time. Non focal  Skin: Skin is warm and dry.     Data Reviewed: I have personally reviewed labs and imaging studies   Time spent: 35 minutes  Elvan Sor, MD [facility] If 7PM-7AM, please contact night-coverage 05/02/2024, 4:24 PM

## 2024-05-02 NOTE — TOC Progression Note (Signed)
 Transition of Care [facility]) - Progression Note    Patient Details  Name: Brady Edwards MRN: [id number] Date of Birth: Jan 16, 1966  Transition of [facility]) CM/SW Contact  Alvaro Louder, [place] Phone Number: 05/02/2024, 10:10 AM  Clinical Narrative:   LCSWA met with patient at the bedside. Patient indicated that he is willing to go to a group home. LCSWA has reached out to local Group home coordinator [place] to start referral process. Awaiting response from [facility] to arrange placement after discharge.   TOC to follow for discharge                       Expected Discharge Plan and Services                                               Social Drivers of Health (SDOH) Interventions SDOH Screenings   Food Insecurity: Food Insecurity Present (04/16/2024)  Housing: High Risk (04/16/2024)  Transportation Needs: Unmet Transportation Needs (04/16/2024)  Utilities: At Risk (04/16/2024)  Depression (PHQ2-9): High Risk (02/27/2024)  Tobacco Use: High Risk (04/07/2024)    Readmission Risk Interventions     No data to display

## 2024-05-02 NOTE — Plan of Care (Signed)

## 2024-05-03 DIAGNOSIS — R531 Weakness: Secondary | ICD-10-CM | POA: Diagnosis not present

## 2024-05-03 LAB — RENAL FUNCTION PANEL
Albumin: 3.1 g/dL — ABNORMAL LOW (ref 3.5–5.0)
Anion gap: 16 — ABNORMAL HIGH (ref 5–15)
BUN: 36 mg/dL — ABNORMAL HIGH (ref 6–20)
CO2: 24 mmol/L (ref 22–32)
Calcium: 9.1 mg/dL (ref 8.9–10.3)
Chloride: 96 mmol/L — ABNORMAL LOW (ref 98–111)
Creatinine, Ser: 10.35 mg/dL — ABNORMAL HIGH (ref 0.61–1.24)
GFR, Estimated: 5 mL/min — ABNORMAL LOW (ref >60)
Glucose, Bld: 89 mg/dL (ref 70–99)
Phosphorus: 3.3 mg/dL (ref 2.5–4.6)
Potassium: 4.4 mmol/L (ref 3.5–5.1)
Sodium: 136 mmol/L (ref 135–145)

## 2024-05-03 LAB — CBC
HCT: 28.8 % — ABNORMAL LOW (ref 39.0–52.0)
Hemoglobin: 9.1 g/dL — ABNORMAL LOW (ref 13.0–17.0)
MCH: 30.8 pg (ref 26.0–34.0)
MCHC: 31.6 g/dL (ref 30.0–36.0)
MCV: 97.6 fL (ref 80.0–100.0)
Platelets: 175 K/uL (ref 150–400)
RBC: 2.95 MIL/uL — ABNORMAL LOW (ref 4.22–5.81)
RDW: 13.6 % (ref 11.5–15.5)
WBC: 8.6 K/uL (ref 4.0–10.5)
nRBC: 0 % (ref 0.0–0.2)

## 2024-05-03 LAB — GLUCOSE, CAPILLARY
Glucose-Capillary: 90 mg/dL (ref 70–99)
Glucose-Capillary: 163 mg/dL — ABNORMAL HIGH (ref 70–99)
Glucose-Capillary: 90 mg/dL (ref 70–99)

## 2024-05-03 MED ORDER — FLUDROCORTISONE ACETATE 0.1 MG PO TABS
0.4000 mg | ORAL_TABLET | Freq: Every day | ORAL | Status: DC
  Administered 2024-05-04 – 2024-05-24 (×17): 0.4 mg via ORAL
  Filled 2024-05-03 (×23): qty 4

## 2024-05-03 MED ORDER — MIDODRINE HCL 5 MG PO TABS
ORAL_TABLET | ORAL | Status: AC
Start: 2024-05-03 — End: 2024-05-03
  Filled 2024-05-03: qty 2

## 2024-05-03 MED ORDER — HEPARIN SODIUM (PORCINE) 1000 UNIT/ML IJ SOLN
INTRAMUSCULAR | Status: AC
  Filled 2024-05-03: qty 5

## 2024-05-03 MED ORDER — HEPARIN SODIUM (PORCINE) 1000 UNIT/ML DIALYSIS: 1000.0000 [IU] | INTRAMUSCULAR | Status: DC | PRN

## 2024-05-03 MED ORDER — EPOETIN ALFA-EPBX 4000 UNIT/ML IJ SOLN
INTRAMUSCULAR | Status: AC
  Filled 2024-05-03: qty 1

## 2024-05-03 MED ORDER — ALTEPLASE 2 MG IJ SOLR: 2.0000 mg | Freq: Once | INTRAMUSCULAR | Status: DC | PRN

## 2024-05-03 NOTE — Progress Notes (Signed)
 [facility]  ROUNDING NOTE   Subjective:   Patient seen and evaluated during dialysis   HEMODIALYSIS FLOWSHEET:  Blood Flow Rate (mL/min): 349 mL/min Arterial Pressure (mmHg): -209.28 mmHg Venous Pressure (mmHg): 78.98 mmHg TMP (mmHg): 6.87 mmHg Ultrafiltration Rate (mL/min): 675 mL/min Dialysate Flow Rate (mL/min): 300 ml/min Dialysis Fluid Bolus: Albumin  Bolus Amount (mL): 100 mL  States his blood pressure continues to fall when he stands. He is symptomatic at those times.    Objective:  Vital signs in last 24 hours:  Temp:  [97.7 F (36.5 C)-98.5 F (36.9 C)] 97.7 F (36.5 C) (09/04 0732) Pulse Rate:  [64-93] 87 (09/04 1000) Resp:  [13-24] 15 (09/04 1000) BP: (91-138)/(67-91) 117/79 (09/04 1000) SpO2:  [96 %-100 %] 98 % (09/04 1000) Weight:  [121.9 kg] 121.9 kg (09/04 0732)  Weight change:  Filed Weights   05/01/24 0735 05/01/24 1250 05/03/24 0732  Weight: 123.1 kg 121.9 kg 121.9 kg    Intake/Output: I/O last 3 completed shifts: In: 240 [P.O.:240] Out: -    Intake/Output this shift:  No intake/output data recorded.  Physical Exam: General: NAD, missing teeth, large habitus  Head: Normocephalic  Eyes: Anicteric  Lungs:  Clear, room air  Heart: Regular rate  Abdomen:  Soft, distended   Extremities:  No peripheral edema.  Neurologic: Alert, awake  Skin: No lesions  Access: Rt chest permcath    Basic Metabolic Panel: Recent Labs  Lab 04/28/24 0737 05/01/24 0814 05/03/24 0730  NA 133* 137 136  K 4.2 4.4 4.4  CL 91* 98 96*  CO2 26 27 24   GLUCOSE 87 77 89  BUN 33* 46* 36*  CREATININE 9.88* 11.02* 10.35*  CALCIUM  9.6 9.0 9.1  PHOS 4.1 2.6 3.3    Liver Function Tests: Recent Labs  Lab 04/28/24 0737 05/01/24 0814 05/03/24 0730  ALBUMIN  3.6 3.3* 3.1*   No results for input(s): LIPASE, AMYLASE in the last 168 hours. No results for input(s): AMMONIA in the last 168 hours.   CBC: Recent Labs  Lab 04/28/24 0737  05/01/24 0814 05/03/24 0730  WBC 16.1* 8.4 8.6  HGB 9.2* 9.2* 9.1*  HCT 28.3* 28.8* 28.8*  MCV 95.9 97.0 97.6  PLT 186 229 175    Cardiac Enzymes: No results for input(s): CKTOTAL, CKMB, CKMBINDEX, TROPONINI in the last 168 hours.  BNP: Invalid input(s): POCBNP  CBG: Recent Labs  Lab 05/01/24 2051 05/02/24 0736 05/02/24 1117 05/02/24 1653 05/02/24 2000  GLUCAP 159* 80 88 94 133*    Microbiology: Results for orders placed or performed during the hospital encounter of 04/07/24  Resp panel by RT-PCR (RSV, Flu A&B, Covid) Anterior Nasal Swab     Status: None   Collection Time: 04/07/24  4:46 PM   Specimen: Anterior Nasal Swab  Result Value Ref Range Status   SARS Coronavirus 2 by RT PCR NEGATIVE NEGATIVE Final    Comment: (NOTE) SARS-CoV-2 target nucleic acids are NOT DETECTED.  The SARS-CoV-2 RNA is generally detectable in upper respiratory specimens during the acute phase of infection. The lowest concentration of SARS-CoV-2 viral copies this assay can detect is 138 copies/mL. A negative result does not preclude SARS-Cov-2 infection and should not be used as the sole basis for treatment or other patient management decisions. A negative result may occur with  improper specimen collection/handling, submission of specimen other than nasopharyngeal swab, presence of viral mutation(s) within the areas targeted by this assay, and inadequate number of viral copies(<138 copies/mL). A negative result must be  combined with clinical observations, patient history, and epidemiological information. The expected result is Negative.  Fact Sheet for Patients:  BloggerCourse.com  Fact Sheet for Healthcare Providers:  SeriousBroker.it  This test is no t yet approved or cleared by the [place]  FDA and  has been authorized for detection and/or diagnosis of SARS-CoV-2 by FDA under an Emergency Use Authorization (EUA).  This EUA will remain  in effect (meaning this test can be used) for the duration of the COVID-19 declaration under Section 564(b)(1) of the Act, 21 U.S.C.section 360bbb-3(b)(1), unless the authorization is terminated  or revoked sooner.       Influenza A by PCR NEGATIVE NEGATIVE Final   Influenza B by PCR NEGATIVE NEGATIVE Final    Comment: (NOTE) The Xpert Xpress SARS-CoV-2/FLU/RSV plus assay is intended as an aid in the diagnosis of influenza from Nasopharyngeal swab specimens and should not be used as a sole basis for treatment. Nasal washings and aspirates are unacceptable for Xpert Xpress SARS-CoV-2/FLU/RSV testing.  Fact Sheet for Patients: BloggerCourse.com  Fact Sheet for Healthcare Providers: SeriousBroker.it  This test is not yet approved or cleared by the [place]  FDA and has been authorized for detection and/or diagnosis of SARS-CoV-2 by FDA under an Emergency Use Authorization (EUA). This EUA will remain in effect (meaning this test can be used) for the duration of the COVID-19 declaration under Section 564(b)(1) of the Act, 21 U.S.C. section 360bbb-3(b)(1), unless the authorization is terminated or revoked.     Resp Syncytial Virus by PCR NEGATIVE NEGATIVE Final    Comment: (NOTE) Fact Sheet for Patients: BloggerCourse.com  Fact Sheet for Healthcare Providers: SeriousBroker.it  This test is not yet approved or cleared by the [place]  FDA and has been authorized for detection and/or diagnosis of SARS-CoV-2 by FDA under an Emergency Use Authorization (EUA). This EUA will remain in effect (meaning this test can be used) for the duration of the COVID-19 declaration under Section 564(b)(1) of the Act, 21 U.S.C. section 360bbb-3(b)(1), unless the authorization is terminated or revoked.  Performed at [facility], [address]  [place], [place] [address]     Coagulation Studies: No results for input(s): LABPROT, INR in the last 72 hours.  Urinalysis: No results for input(s): COLORURINE, LABSPEC, PHURINE, GLUCOSEU, HGBUR, BILIRUBINUR, KETONESUR, PROTEINUR, UROBILINOGEN, NITRITE, LEUKOCYTESUR in the last 72 hours.  Invalid input(s): APPERANCEUR    Imaging: No results found.   Medications:     bisacodyl   10 mg Oral QHS   buPROPion   300 mg Oral Daily   calcitRIOL   0.25 mcg Oral Daily   Chlorhexidine  Gluconate Cloth  6 each Topical Daily   epoetin  alfa-epbx (RETACRIT ) injection  4,000 Units Intravenous Q T,Th,Sat-1800   fludrocortisone   0.4 mg Oral Daily   heparin   5,000 Units Subcutaneous Q8H   hydrocortisone   10 mg Oral Daily   hydrocortisone   5 mg Oral QPM   insulin  aspart  0-15 Units Subcutaneous TID WC   midodrine   10 mg Oral TID WC   polyethylene glycol  17 g Oral BID   sevelamer  carbonate  1,600 mg Oral TID WC   venlafaxine  XR  150 mg Oral Q breakfast   And   venlafaxine  XR  75 mg Oral Q breakfast   acetaminophen , alteplase , bisacodyl , heparin , ondansetron  (ZOFRAN ) IV, ondansetron   Assessment/ Plan:  [name] is a 58 y.o.  male  with a PMHx of hypertension, atrial fibrillation, BPH, hyperlipidemia, anxiety, constipation, morbid obesity, and end stage renal disease  on hemodialysis.    [facility]- will need to resubmit for chair at discharge.    End stage renal disease on hemodialysis         Receiving dialysis today, UF 0.5L as tolerated. Next treatment scheduled for Saturday. Will continue to see patient on dialysis days only, while stable, as primary team seeking appropriate discharge plan.    2.  Acute metabolic acidosis.  Serum bicarbonate 16 on admission. Managing with dialysis.   3.  Anemia of chronic kidney disease. Hgb 9.1, continue retacrit  4000 units with dialysis.     4.  Secondary hyperparathyroidism. PTH 199 during recent  admission. Continue Renvela  2 tabs TID.   Calcium  and phosphorus stable.   5. Severe orthostatic hypotension Workup so far has included 2D echo from 04/11/2024 which shows LVEF 65 to 70%, normal LV systolic function, mild concentric LVH, normal diastolic parameters, normal right ventricular systolic function.  A.m. cortisol level normal from 04/24/2024.  TSH normal.  Other differential includes amyloidosis.  Kidney biopsy or fat pad biopsy can be considered. Given steroids. Continue TEDs, midodrine  and abd binders. Will increase Florinef  to 0.4mg  daily for blood pressure support.      LOS: 14 Sasha Rueth 9/4/202510:26 AM

## 2024-05-03 NOTE — Progress Notes (Signed)
 Hemodialysis Note:  Received patient in bed to unit. Alert and oriented. Informed consent singed and in chart.  Treatment initiated: 0747 Treatment completed: 1125  Access used: Right internal jugular catheter Access issues: End treatment 1 minute early due to system clot and unable to return blood. HD NP made aware.  Patient tolerated well. Transported back to room, alert without acute distress. Report given to patient's RN.  Total UF removed: 1 Liter Medications given: Midodrine  10 mg tablet, Retacrit  4000 units IV  Post HD weight: 120.4 Kg  [facility]

## 2024-05-03 NOTE — Plan of Care (Signed)

## 2024-05-03 NOTE — Progress Notes (Signed)
 PROGRESS NOTE    Brady Edwards  FMW:[id number] DOB: 1966/04/18 DOA: 04/15/2024 PCP: Vicci Duwaine SQUIBB, DO  147A/147A-AA  LOS: 14 days   Brief hospital course:   Brady Edwards is a 58 y.o. male with medical history significant of newly ESRD recently started on HD, atrial fibrillation, anxiety and depression, morbid obesity who presented with complaints of weakness and falls.   Pt was very frustrated with his medical and mental problems, and wanted us  to make him better.  Pt complained of fatigue, lack of energy, and weakness, causing to fall several times since he was discharged from hospital on 04/13/24.  No focal neuro deficits.  Pt said he has had long standing mental health issues and needs to see psych.  Pt complained of constipation for the past 2 months that wasn't addressed during his last hospitalization.     Of note, pt was hospitalized from 04/07/24 to 04/13/24 during which he was started on dialysis, and was discharged after dialysis on 8/15 to continue outpatient dialysis TTS.   ED Course: initial vitals: afebrile, pulse 105, BP 86/65, RR 12, sating 99% on room air.  Labs consistent with ESRD.  CXR no acute finding.  EKG showed sinus tachycardia.  CT head no acute finding.  ED provider reported pt said he would be better off not living at this point but does not have a plan for suicide and is going to receive help.  Psych consult placed.  ED provider contacted oncall nephro, who plans dialysis for pt tomorrow.  Assessment & Plan:  Brady Edwards is a 58 y.o. male with medical history significant of recently diagnosed ESRD now on HD, atrial fibrillation, anxiety and depression, morbid obesity who presented with complaints of weakness and falls.    # Orthostatic Hypotension Persistent, symptomatic when going from seated to standing position. No concern for infection. No known neurological conditions that could be contributing. AM cortisol WNL though drawn at 0500.  -- Continue  to hold home Toprol  due to low BP --Continue midodrine  10 mg TID --TED/compression hose abd binder  -- Increase florinef  0.2mg  every day --Fluid intake liberalized per nephro -- Florinef  and midodrine  remain ineffective need to consider alternative agent 8/28 stared steroids, Solu-Medrol  125 mg IV daily for 3 days, PPI for GI prophylaxis and insulin  sliding scale monitor CBG for hyperglycemia 8/30 patient felt improvement with the steroids, still has orthostasis, BP drops at standing for 3 minutes.  But no problem with ambulation. Started Cortef  50 mg in the morning and 25 mg in the evening, started tapering dose over 5 days. Hopefully patient will not need more steroids and blood pressure will improve in few days. 9/2 blood pressure is improving, RN was advised to hold off midodrine  and Florinef  if patient does not needed, follow parameters 9/3 BP is fluctuating and pt was unable to walk in the morning while working with PT   # Weakness and fall  --Fatigue and weakness have been progressive, due to recent hospitalization and decline in health.  CT head wnl, no acute finding. Noted to have elevated BUN (unclear of time line regarding rise in BUN). Also noted to have significant orthostatic hypotension. Suspect this is likely cause of fall.  --PT/OT recommending SNF 8/27 acetylcholine antibodies negative 8/27 check autoantibodies to rule out any autoimmune disorder.    # ESRD on HD TTS Secondary hyperparathyroidism Calcitriol  deficiency  --recently started on dialysis --HD per nephro   # Normocytic anemia, mild  Likely anemia of  chronic disease  Stable.  Iron panel within normal limits.  B12 within normal limits.  Folate within normal limits -ESA per nephrology     Latest Ref Rng & Units 05/03/2024    7:30 AM 05/01/2024    8:14 AM 04/28/2024    7:37 AM  CBC  WBC 4.0 - 10.5 K/uL 8.6  8.4  16.1   Hemoglobin 13.0 - 17.0 g/dL 9.1  9.2  9.2   Hematocrit 39.0 - 52.0 % 28.8  28.8  28.3    Platelets 150 - 400 K/uL 175  229  186     # Hx of Afib --pt not taking anticoagulation PTA.  Currently in sinus. --hold home Toprol  due to low BP   # Depression and hx of panic disorder # Passive SI --psych consulted --no need for SI precaution, per psych --cont home bupropion  and Effexor   # Vitamin D  deficiency: Continue Calcitrol    # OSA --CPAP nightly   # Obesity, Class III, BMI 40-49.9 (morbid obesity)  Calorie restricted diet and daily exercise advised to lose body weight.  Lifestyle modification discussed.   DVT prophylaxis: Heparin  SQ Code Status: Full code  Family Communication:  Level of care: Med-Surg Dispo:   The patient is from: home Anticipated d/c is to: SNF rehab, patient is currently unhomed as he was evicted recently. TOC consulted. Patient remains hospitalized given persistent severe orthostatic hypotension  Anticipated d/c date is: whenever TOC find a place for him 9/4 awaiting for group home placement.  Subjective and Interval History:  Patient was seen and examined at bedside today. No any active issues overnight.  Patient was seen at the end of hemodialysis, orthostatics were checked, blood pressure did drop while standing and patient was wobbling while standing but no any other symptoms.   Objective: Vitals:   05/03/24 1125 05/03/24 1130 05/03/24 1147 05/03/24 1208  BP: 108/66 103/75 124/80 112/78  Pulse: 85 88  70  Resp: 18   19  Temp: 97.7 F (36.5 C)   (!) 97.5 F (36.4 C)  TempSrc: Oral   Oral  SpO2: 98% 99%  98%  Weight:  120.4 kg    Height:        Intake/Output Summary (Last 24 hours) at 05/03/2024 1436 Last data filed at 05/03/2024 1300 Gross per 24 hour  Intake 360 ml  Output 1000 ml  Net -640 ml    Filed Weights   05/01/24 1250 05/03/24 0732 05/03/24 1130  Weight: 121.9 kg 121.9 kg 120.4 kg    Examination:    Constitutional: In no distress.  Cardiovascular: Normal rate, regular rhythm. No lower extremity edema   Pulmonary: Non labored breathing on room air, no wheezing or rales.   Abdominal: Soft. Non distended and non tender Musculoskeletal: Normal range of motion.     Neurological: Alert and oriented to person, place, and time. Non focal  Skin: Skin is warm and dry.     Data Reviewed: I have personally reviewed labs and imaging studies   Time spent: 35 minutes  Elvan Sor, MD [facility] If 7PM-7AM, please contact night-coverage 05/03/2024, 2:36 PM

## 2024-05-03 NOTE — Progress Notes (Signed)
 PT Cancellation Note  Patient Details Name: Brady Edwards MRN: [id number] DOB: August 04, 1966   Cancelled Treatment:    Reason Eval/Treat Not Completed: Patient at procedure or test/unavailable Patient at HD, will re-attempt at later time/date  Abria Vannostrand 05/03/2024, 8:43 AM

## 2024-05-04 DIAGNOSIS — R531 Weakness: Secondary | ICD-10-CM | POA: Diagnosis not present

## 2024-05-04 LAB — GLUCOSE, CAPILLARY
Glucose-Capillary: 112 mg/dL — ABNORMAL HIGH (ref 70–99)
Glucose-Capillary: 102 mg/dL — ABNORMAL HIGH (ref 70–99)
Glucose-Capillary: 84 mg/dL (ref 70–99)
Glucose-Capillary: 114 mg/dL — ABNORMAL HIGH (ref 70–99)

## 2024-05-04 NOTE — Progress Notes (Signed)
 Progress Note   [name] FMW:[id number] DOB: 07-31-1966 DOA: 04/15/2024     15 DOS: the patient was seen and examined on 05/04/2024   Brief hospital course: 58 y.o. male with medical history significant of newly ESRD recently started on HD, atrial fibrillation, anxiety and depression, morbid obesity who presented with complaints of weakness and falls.   Pt was very frustrated with his medical and mental problems, and wanted us  to make him better.  Pt complained of fatigue, lack of energy, and weakness, causing to fall several times since he was discharged from hospital on 04/13/24.  No focal neuro deficits.  Pt said he has had long standing mental health issues and needs to see psych.  Pt complained of constipation for the past 2 months that wasn't addressed during his last hospitalization.     Of note, pt was hospitalized from 04/07/24 to 04/13/24 during which he was started on dialysis, and was discharged after dialysis on 8/15 to continue outpatient dialysis TTS.   ED Course: initial vitals: afebrile, pulse 105, BP 86/65, RR 12, sating 99% on room air.  Labs consistent with ESRD.  CXR no acute finding.  EKG showed sinus tachycardia.  CT head no acute finding.  ED provider reported pt said he would be better off not living at this point but does not have a plan for suicide and is going to receive help.  Psych consult placed.  ED provider contacted oncall nephro, who plans dialysis for pt tomorrow.    Assessment and Plan: Brady Edwards is a 58 y.o. male with medical history significant of recently diagnosed ESRD now on HD, atrial fibrillation, anxiety and depression, morbid obesity who presented with complaints of weakness and falls.      # Orthostatic Hypotension Persistent, symptomatic when going from seated to standing position. No concern for infection. No known neurological conditions that could be contributing. AM cortisol WNL though drawn at 0500.  -- Continue to hold  home Toprol  due to low BP --Continue midodrine  10 mg TID --TED/compression hose abd binder  -- Increase florinef  0.2mg  every day --Fluid intake liberalized per nephro -- Florinef  and midodrine  remain ineffective need to consider alternative agent 8/28 stared steroids, Solu-Medrol  125 mg IV daily for 3 days, PPI for GI prophylaxis and insulin  sliding scale monitor CBG for hyperglycemia 8/30 patient felt improvement with the steroids, still has orthostasis, BP drops at standing for 3 minutes.  But no problem with ambulation. Started Cortef  50 mg in the morning and 25 mg in the evening, started tapering dose over 5 days. 9/5 . BP drops with standing, he feels lightheaded.    # Weakness and fall  --Fatigue and weakness have been progressive, due to recent hospitalization and decline in health.  CT head wnl, no acute finding. Noted to have elevated BUN (unclear of time line regarding rise in BUN). Also noted to have significant orthostatic hypotension. Suspect this is likely cause of fall.  --PT/OT recommending SNF 8/27 acetylcholine antibodies negative 8/27 check autoantibodies to rule out any autoimmune disorder.     # ESRD on HD TTS Secondary hyperparathyroidism Calcitriol  deficiency  --recently started on dialysis --HD per nephro     # Normocytic anemia, mild  Likely anemia of chronic disease  Stable.  Iron panel within normal limits.  B12 within normal limits.  Folate within normal limits -ESA per nephrology       Subjective: No acute complaints. Continues to feel lightheaded with standing  Physical Exam:  Vitals:   05/03/24 1706 05/03/24 1953 05/04/24 0538 05/04/24 0721  BP: 114/79 111/72 95/60 109/85  Pulse: 92 84 94 92  Resp: 17 18 18 17   Temp: 98.3 F (36.8 C) 98.2 F (36.8 C) 97.9 F (36.6 C) 98.1 F (36.7 C)  TempSrc:   Oral   SpO2: 99% 98% 99% 92%  Weight:      Height:      Constitutional: In no distress.  Cardiovascular: Normal rate, regular rhythm. No lower  extremity edema  Pulmonary: Non labored breathing on room air, no wheezing or rales.   Abdominal: Soft. Non distended and non tender Musculoskeletal: Normal range of motion.     Neurological: Alert and oriented to person, place, and time. Non focal  Skin: Skin is warm and dry.     Data Reviewed:  There are no new results to review at this time.    Disposition: Status is: Inpatient   Planned Discharge Destination: Skilled nursing facility    Time spent: 35 minutes  Author: Landon FORBES Baller, MD 05/04/2024 2:34 PM  For on call review [email or web address].

## 2024-05-04 NOTE — Progress Notes (Signed)
 Physical Therapy Treatment Patient Details Name: Brady Edwards MRN: [id number] DOB: 1966/07/01 Today's Date: 05/04/2024   History of Present Illness Brady Edwards is a 58 y.o. male with pmh of hypertension, atrial fibrillation on metoprolol  not on anticoagulation, BPH, hyperlipidemia, anxiety, constipation, morbid obesity recently admitted from 8/9-8/15/25 recently initiated on dialysis who presents from home with general weakness, frequent falls since discharge, shortness of breath and passive suicidal ideation.    PT Comments  Limited session d/t low BP. Pt continues to demonstrate orthostatic hypotension despite use of TED hose and abdominal binder. BP were the following: supine 126/37mmHG (87bpm), sitting 97/71mmHg (124bpm), stand 75/35mmHg (123bpm), sitting 112/8mmHg (106bpm). Pt is symptomatic with all positional changes however demonstrates ability to safely transfer from bed to recliner with SBA for safety.    If plan is discharge home, recommend the following: A little help with walking and/or transfers;Help with stairs or ramp for entrance;Direct supervision/assist for medications management;A little help with bathing/dressing/bathroom   Can travel by private vehicle     Yes  Equipment Recommendations  None recommended by PT    Recommendations for Other Services       Precautions / Restrictions Precautions Precautions: Fall Recall of Precautions/Restrictions: Intact Precaution/Restrictions Comments: Monitor BP Restrictions Weight Bearing Restrictions Per Provider Order: No     Mobility  Bed Mobility Overal bed mobility: Independent             General bed mobility comments: Able to get to EOB independently    Transfers Overall transfer level: Independent Equipment used: None Transfers: Bed to chair/wheelchair/BSC, Sit to/from Stand Sit to Stand: Independent   Step pivot transfers: Supervision       General transfer comment: Supervision for safety.  Pt endorses dizziness with all positional changes.    Ambulation/Gait               General Gait Details: unable due to low BP   Stairs             Wheelchair Mobility     Tilt Bed    Modified Rankin (Stroke Patients Only)       Balance Overall balance assessment: Independent Sitting-balance support: Feet supported   Sitting balance - Comments: steady reaching outside BOS   Standing balance support: No upper extremity supported Standing balance-Leahy Scale: Normal Standing balance comment: No LOB, however pt reports dizziness with standing                            Communication Communication Communication: No apparent difficulties  Cognition Arousal: Alert Behavior During Therapy: WFL for tasks assessed/performed   PT - Cognitive impairments: No apparent impairments                       PT - Cognition Comments: Pt is agreeable to PT session. Following commands: Intact      Cueing Cueing Techniques: Verbal cues  Exercises Other Exercises Other Exercises: Monitoring BP throughout session. (see clinical impression for details) Other Exercises: Edu about safety with ambulation    General Comments        Pertinent Vitals/Pain      Home Living                          Prior Function            PT Goals (current goals can now be found in the care  plan section) Acute Rehab PT Goals Patient Stated Goal: improve independence; find out his medical problems PT Goal Formulation: With patient Time For Goal Achievement: 05/14/24 Potential to Achieve Goals: Fair Progress towards PT goals: Progressing toward goals    Frequency    Min 1X/week      PT Plan      Co-evaluation              AM-PAC PT 6 Clicks Mobility   Outcome Measure  Help needed turning from your back to your side while in a flat bed without using bedrails?: None Help needed moving from lying on your back to sitting on the side  of a flat bed without using bedrails?: None Help needed moving to and from a bed to a chair (including a wheelchair)?: A Little Help needed standing up from a chair using your arms (e.g., wheelchair or bedside chair)?: None Help needed to walk in hospital room?: A Little Help needed climbing 3-5 steps with a railing? : A Little 6 Click Score: 21    End of Session Equipment Utilized During Treatment: Other (comment) (TED hose, abdominal binder) Activity Tolerance: Treatment limited secondary to medical complications (Comment) (orthostatic) Patient left: in chair;with call bell/phone within reach Nurse Communication: Mobility status PT Visit Diagnosis: Difficulty in walking, not elsewhere classified (R26.2);Dizziness and giddiness (R42)     Time: 9078-9059 PT Time Calculation (min) (ACUTE ONLY): 19 min  Charges:                            Maggy Wyble, SPT    Ernestene Coover 05/04/2024, 10:13 AM

## 2024-05-04 NOTE — Plan of Care (Signed)

## 2024-05-04 NOTE — TOC Progression Note (Signed)
 Transition of Care [facility]) - Progression Note    Patient Details  Name: Brady Edwards MRN: [id number] Date of Birth: 03/15/1966  Transition of Care [facility]) CM/SW Contact  Hong Moring  [place], [place] Phone Number: 05/04/2024, 10:24 AM  Clinical Narrative:   LCSWA spoke with Group home Coordinator Rosana regarding placement for patient. Rosana indicated that he will come assess the patient for placement at one of his group homes either today or Monday and let LCSWA know if he can come to his group home or not. Rosana indicated that he will assess him for [facility] and let LCSWA know of decision.   TOC to follow for discharge                      Expected Discharge Plan and Services                                               Social Drivers of Health (SDOH) Interventions SDOH Screenings   Food Insecurity: Food Insecurity Present (04/16/2024)  Housing: High Risk (04/16/2024)  Transportation Needs: Unmet Transportation Needs (04/16/2024)  Utilities: At Risk (04/16/2024)  Depression (PHQ2-9): High Risk (02/27/2024)  Tobacco Use: High Risk (04/07/2024)    Readmission Risk Interventions     No data to display

## 2024-05-05 DIAGNOSIS — R531 Weakness: Secondary | ICD-10-CM | POA: Diagnosis not present

## 2024-05-05 LAB — CBC WITH DIFFERENTIAL/PLATELET
Abs Immature Granulocytes: 0.26 K/uL — ABNORMAL HIGH (ref 0.00–0.07)
Basophils Absolute: 0 K/uL (ref 0.0–0.1)
Basophils Relative: 0 %
Eosinophils Absolute: 0.2 K/uL (ref 0.0–0.5)
Eosinophils Relative: 3 %
HCT: 27.8 % — ABNORMAL LOW (ref 39.0–52.0)
Hemoglobin: 8.8 g/dL — ABNORMAL LOW (ref 13.0–17.0)
Immature Granulocytes: 3 %
Lymphocytes Relative: 33 %
Lymphs Abs: 3 K/uL (ref 0.7–4.0)
MCH: 31.1 pg (ref 26.0–34.0)
MCHC: 31.7 g/dL (ref 30.0–36.0)
MCV: 98.2 fL (ref 80.0–100.0)
Monocytes Absolute: 1 K/uL (ref 0.1–1.0)
Monocytes Relative: 11 %
Neutro Abs: 4.5 K/uL (ref 1.7–7.7)
Neutrophils Relative %: 50 %
Platelets: 198 K/uL (ref 150–400)
RBC: 2.83 MIL/uL — ABNORMAL LOW (ref 4.22–5.81)
RDW: 13.7 % (ref 11.5–15.5)
WBC: 8.9 K/uL (ref 4.0–10.5)
nRBC: 0 % (ref 0.0–0.2)

## 2024-05-05 LAB — RENAL FUNCTION PANEL
Albumin: 3.2 g/dL — ABNORMAL LOW (ref 3.5–5.0)
Anion gap: 10 (ref 5–15)
BUN: 34 mg/dL — ABNORMAL HIGH (ref 6–20)
CO2: 26 mmol/L (ref 22–32)
Calcium: 8.8 mg/dL — ABNORMAL LOW (ref 8.9–10.3)
Chloride: 99 mmol/L (ref 98–111)
Creatinine, Ser: 9.37 mg/dL — ABNORMAL HIGH (ref 0.61–1.24)
GFR, Estimated: 6 mL/min — ABNORMAL LOW (ref >60)
Glucose, Bld: 104 mg/dL — ABNORMAL HIGH (ref 70–99)
Phosphorus: 4 mg/dL (ref 2.5–4.6)
Potassium: 4.5 mmol/L (ref 3.5–5.1)
Sodium: 135 mmol/L (ref 135–145)

## 2024-05-05 LAB — GLUCOSE, CAPILLARY
Glucose-Capillary: 88 mg/dL (ref 70–99)
Glucose-Capillary: 73 mg/dL (ref 70–99)
Glucose-Capillary: 104 mg/dL — ABNORMAL HIGH (ref 70–99)

## 2024-05-05 LAB — MUSK ANTIBODIES: MuSK Antibodies: <1 U/mL

## 2024-05-05 MED ORDER — HEPARIN SODIUM (PORCINE) 1000 UNIT/ML DIALYSIS
25.0000 [IU]/kg | INTRAMUSCULAR | Status: DC | PRN
  Administered 2024-05-05: 3000 [IU] via INTRAVENOUS_CENTRAL

## 2024-05-05 MED ORDER — EPOETIN ALFA-EPBX 4000 UNIT/ML IJ SOLN
INTRAMUSCULAR | Status: AC
Start: 2024-05-05 — End: 2024-05-05
  Filled 2024-05-05: qty 1

## 2024-05-05 MED ORDER — HEPARIN SODIUM (PORCINE) 1000 UNIT/ML DIALYSIS
1000.0000 [IU] | INTRAMUSCULAR | Status: DC | PRN
  Administered 2024-05-05 (×3): 1000 [IU]

## 2024-05-05 MED ORDER — ALTEPLASE 2 MG IJ SOLR: 2.0000 mg | Freq: Once | INTRAMUSCULAR | Status: DC | PRN

## 2024-05-05 MED ORDER — MIDODRINE HCL 5 MG PO TABS
ORAL_TABLET | ORAL | Status: AC
  Filled 2024-05-05: qty 2

## 2024-05-05 MED ORDER — HEPARIN SODIUM (PORCINE) 1000 UNIT/ML IJ SOLN
INTRAMUSCULAR | Status: AC
  Filled 2024-05-05: qty 3

## 2024-05-05 NOTE — Progress Notes (Signed)
 [facility]  ROUNDING NOTE   Subjective:   Patient seen and evaluated during dialysis   HEMODIALYSIS FLOWSHEET:  Blood Flow Rate (mL/min): 350 mL/min Arterial Pressure (mmHg): -180.39 mmHg Venous Pressure (mmHg): 164.84 mmHg TMP (mmHg): 12.93 mmHg Ultrafiltration Rate (mL/min): 543 mL/min Dialysate Flow Rate (mL/min): 299 ml/min Dialysis Fluid Bolus: Albumin  Bolus Amount (mL): 100 mL  Patient resting quietly during treatment   Objective:  Vital signs in last 24 hours:  Temp:  [98.1 F (36.7 C)-98.2 F (36.8 C)] 98.2 F (36.8 C) (09/06 0721) Pulse Rate:  [62-89] 69 (09/06 0900) Resp:  [15-31] 18 (09/06 0900) BP: (54-129)/(43-78) 117/71 (09/06 0900) SpO2:  [98 %-100 %] 100 % (09/06 0900) Weight:  [122 kg] 122 kg (09/06 0721)  Weight change:  Filed Weights   05/03/24 0732 05/03/24 1130 05/05/24 0721  Weight: 121.9 kg 120.4 kg 122 kg    Intake/Output: I/O last 3 completed shifts: In: 480 [P.O.:480] Out: -    Intake/Output this shift:  No intake/output data recorded.  Physical Exam: General: NAD, missing teeth, large habitus  Head: Normocephalic  Eyes: Anicteric  Lungs:  Clear, room air  Heart: Regular rate  Abdomen:  Soft, distended   Extremities:  No peripheral edema.  Neurologic: Alert, awake  Skin: No lesions  Access: Rt chest permcath    Basic Metabolic Panel: Recent Labs  Lab 05/01/24 0814 05/03/24 0730 05/05/24 0740  NA 137 136 135  K 4.4 4.4 4.5  CL 98 96* 99  CO2 27 24 26   GLUCOSE 77 89 104*  BUN 46* 36* 34*  CREATININE 11.02* 10.35* 9.37*  CALCIUM  9.0 9.1 8.8*  PHOS 2.6 3.3 4.0    Liver Function Tests: Recent Labs  Lab 05/01/24 0814 05/03/24 0730 05/05/24 0740  ALBUMIN  3.3* 3.1* 3.2*   No results for input(s): LIPASE, AMYLASE in the last 168 hours. No results for input(s): AMMONIA in the last 168 hours.   CBC: Recent Labs  Lab 05/01/24 0814 05/03/24 0730 05/05/24 0740  WBC 8.4 8.6 8.9  NEUTROABS   --   --  4.5  HGB 9.2* 9.1* 8.8*  HCT 28.8* 28.8* 27.8*  MCV 97.0 97.6 98.2  PLT 229 175 198    Cardiac Enzymes: No results for input(s): CKTOTAL, CKMB, CKMBINDEX, TROPONINI in the last 168 hours.  BNP: Invalid input(s): POCBNP  CBG: Recent Labs  Lab 05/03/24 2139 05/04/24 0802 05/04/24 1154 05/04/24 1715 05/04/24 2058  GLUCAP 90 112* 102* 84 114*    Microbiology: Results for orders placed or performed during the hospital encounter of 04/07/24  Resp panel by RT-PCR (RSV, Flu A&B, Covid) Anterior Nasal Swab     Status: None   Collection Time: 04/07/24  4:46 PM   Specimen: Anterior Nasal Swab  Result Value Ref Range Status   SARS Coronavirus 2 by RT PCR NEGATIVE NEGATIVE Final    Comment: (NOTE) SARS-CoV-2 target nucleic acids are NOT DETECTED.  The SARS-CoV-2 RNA is generally detectable in upper respiratory specimens during the acute phase of infection. The lowest concentration of SARS-CoV-2 viral copies this assay can detect is 138 copies/mL. A negative result does not preclude SARS-Cov-2 infection and should not be used as the sole basis for treatment or other patient management decisions. A negative result may occur with  improper specimen collection/handling, submission of specimen other than nasopharyngeal swab, presence of viral mutation(s) within the areas targeted by this assay, and inadequate number of viral copies(<138 copies/mL). A negative result must be combined with clinical  observations, patient history, and epidemiological information. The expected result is Negative.  Fact Sheet for Patients:  BloggerCourse.com  Fact Sheet for Healthcare Providers:  SeriousBroker.it  This test is no t yet approved or cleared by the [place]  FDA and  has been authorized for detection and/or diagnosis of SARS-CoV-2 by FDA under an Emergency Use Authorization (EUA). This EUA will remain  in effect  (meaning this test can be used) for the duration of the COVID-19 declaration under Section 564(b)(1) of the Act, 21 U.S.C.section 360bbb-3(b)(1), unless the authorization is terminated  or revoked sooner.       Influenza A by PCR NEGATIVE NEGATIVE Final   Influenza B by PCR NEGATIVE NEGATIVE Final    Comment: (NOTE) The Xpert Xpress SARS-CoV-2/FLU/RSV plus assay is intended as an aid in the diagnosis of influenza from Nasopharyngeal swab specimens and should not be used as a sole basis for treatment. Nasal washings and aspirates are unacceptable for Xpert Xpress SARS-CoV-2/FLU/RSV testing.  Fact Sheet for Patients: BloggerCourse.com  Fact Sheet for Healthcare Providers: SeriousBroker.it  This test is not yet approved or cleared by the [place]  FDA and has been authorized for detection and/or diagnosis of SARS-CoV-2 by FDA under an Emergency Use Authorization (EUA). This EUA will remain in effect (meaning this test can be used) for the duration of the COVID-19 declaration under Section 564(b)(1) of the Act, 21 U.S.C. section 360bbb-3(b)(1), unless the authorization is terminated or revoked.     Resp Syncytial Virus by PCR NEGATIVE NEGATIVE Final    Comment: (NOTE) Fact Sheet for Patients: BloggerCourse.com  Fact Sheet for Healthcare Providers: SeriousBroker.it  This test is not yet approved or cleared by the [place]  FDA and has been authorized for detection and/or diagnosis of SARS-CoV-2 by FDA under an Emergency Use Authorization (EUA). This EUA will remain in effect (meaning this test can be used) for the duration of the COVID-19 declaration under Section 564(b)(1) of the Act, 21 U.S.C. section 360bbb-3(b)(1), unless the authorization is terminated or revoked.  Performed at [facility], [address] [place], [place] [address]     Coagulation  Studies: No results for input(s): LABPROT, INR in the last 72 hours.  Urinalysis: No results for input(s): COLORURINE, LABSPEC, PHURINE, GLUCOSEU, HGBUR, BILIRUBINUR, KETONESUR, PROTEINUR, UROBILINOGEN, NITRITE, LEUKOCYTESUR in the last 72 hours.  Invalid input(s): APPERANCEUR    Imaging: No results found.   Medications:     bisacodyl   10 mg Oral QHS   buPROPion   300 mg Oral Daily   calcitRIOL   0.25 mcg Oral Daily   Chlorhexidine  Gluconate Cloth  6 each Topical Daily   epoetin  alfa-epbx (RETACRIT ) injection  4,000 Units Intravenous Q T,Th,Sat-1800   fludrocortisone   0.4 mg Oral Daily   heparin   5,000 Units Subcutaneous Q8H   insulin  aspart  0-15 Units Subcutaneous TID WC   midodrine   10 mg Oral TID WC   polyethylene glycol  17 g Oral BID   sevelamer  carbonate  1,600 mg Oral TID WC   venlafaxine  XR  150 mg Oral Q breakfast   And   venlafaxine  XR  75 mg Oral Q breakfast   acetaminophen , alteplase , bisacodyl , heparin , heparin , ondansetron  (ZOFRAN ) IV, ondansetron   Assessment/ Plan:  [name] is a 58 y.o.  male  with a PMHx of hypertension, atrial fibrillation, BPH, hyperlipidemia, anxiety, constipation, morbid obesity, and end stage renal disease on hemodialysis.    [facility]- will need to resubmit for chair at discharge.  End stage renal disease on hemodialysis         Receiving dialysis today, UF 0.5-1L as tolerated. Next treatment scheduled for Tuesday. Will continue to see patient on dialysis days only, while stable, as primary team seeking appropriate discharge plan.    2.  Acute metabolic acidosis.  Serum bicarbonate 16 on admission. Managing with dialysis.   3.  Anemia of chronic kidney disease. Hgb 8.8, continue retacrit  4000 units with dialysis.     4.  Secondary hyperparathyroidism. PTH 199 during recent admission. Continue Renvela  2 tabs TID.   Bone minerals within optimal range for renal patient.   5.  Severe orthostatic hypotension Workup so far has included 2D echo from 04/11/2024 which shows LVEF 65 to 70%, normal LV systolic function, mild concentric LVH, normal diastolic parameters, normal right ventricular systolic function.  A.m. cortisol level normal from 04/24/2024.  TSH normal.  Other differential includes amyloidosis.  Kidney biopsy or fat pad biopsy can be considered. Given steroids. Continue TEDs, midodrine  and abd binders. Florinef  to 0.4mg  daily for blood pressure support.      LOS: 16 Brady Edwards 9/6/20259:18 AM

## 2024-05-05 NOTE — Progress Notes (Signed)
 Progress Note   [name] FMW:[id number] DOB: 1965-09-10 DOA: 04/15/2024     16 DOS: the patient was seen and examined on 05/05/2024   Brief hospital course: 58 y.o. male with medical history significant of newly ESRD recently started on HD, atrial fibrillation, anxiety and depression, morbid obesity who presented with complaints of weakness and falls.   Pt was very frustrated with his medical and mental problems, and wanted us  to make him better.  Pt complained of fatigue, lack of energy, and weakness, causing to fall several times since he was discharged from hospital on 04/13/24.  No focal neuro deficits.  Pt said he has had long standing mental health issues and needs to see psych.  Pt complained of constipation for the past 2 months that wasn't addressed during his last hospitalization.     Of note, pt was hospitalized from 04/07/24 to 04/13/24 during which he was started on dialysis, and was discharged after dialysis on 8/15 to continue outpatient dialysis TTS.   ED Course: initial vitals: afebrile, pulse 105, BP 86/65, RR 12, sating 99% on room air.  Labs consistent with ESRD.  CXR no acute finding.  EKG showed sinus tachycardia.  CT head no acute finding.  ED provider reported pt said he would be better off not living at this point but does not have a plan for suicide and is going to receive help.  Psych consult placed.  ED provider contacted oncall nephro, who plans dialysis for pt tomorrow.    Assessment and Plan: Brady Edwards is a 58 y.o. male with medical history significant of recently diagnosed ESRD now on HD, atrial fibrillation, anxiety and depression, morbid obesity who presented with complaints of weakness and falls.      # Orthostatic Hypotension Persistent, symptomatic when going from seated to standing position. No concern for infection. No known neurological conditions that could be contributing. AM cortisol WNL though drawn at 0500.  -- Continue to hold  home Toprol  due to low BP --Continue midodrine  10 mg TID --TED/compression hose abd binder  -- Increase florinef  0.2mg  every day --Fluid intake liberalized per nephro -- Florinef  and midodrine  remain ineffective need to consider alternative agent 8/28 stared steroids, Solu-Medrol  125 mg IV daily for 3 days, PPI for GI prophylaxis and insulin  sliding scale monitor CBG for hyperglycemia 8/30 patient felt improvement with the steroids, still has orthostasis, BP drops at standing for 3 minutes.  But no problem with ambulation. Started Cortef  50 mg in the morning and 25 mg in the evening, started tapering dose over 5 days. 9/5 . BP drops with standing, he feels lightheaded.    # Weakness and fall  --Fatigue and weakness have been progressive, due to recent hospitalization and decline in health.  CT head wnl, no acute finding. Noted to have elevated BUN (unclear of time line regarding rise in BUN). Also noted to have significant orthostatic hypotension. Suspect this is likely cause of fall.  --PT/OT recommending SNF 8/27 acetylcholine antibodies negative 8/27 check autoantibodies to rule out any autoimmune disorder.     # ESRD on HD TTS Secondary hyperparathyroidism Calcitriol  deficiency  --recently started on dialysis --HD per nephro     # Normocytic anemia, mild  Likely anemia of chronic disease  Stable.  Iron panel within normal limits.  B12 within normal limits.  Folate within normal limits -ESA per nephrology       Subjective: No acute complaints. Continues to feel lightheaded with standing  Physical Exam:  Vitals:   05/05/24 0800 05/05/24 0815 05/05/24 0830 05/05/24 0836  BP: (!) 118/57 103/67 (!) 54/43 98/74  Pulse: 85 66 80 66  Resp: 17 (!) 22 (!) 31 17  Temp:      TempSrc:      SpO2: 99% 98% 99% 98%  Weight:      Height:      Constitutional: In no distress.  Cardiovascular: Normal rate, regular rhythm. No lower extremity edema  Pulmonary: Non labored breathing on  room air, no wheezing or rales.   Abdominal: Soft. Non distended and non tender Musculoskeletal: Normal range of motion.     Neurological: Alert and oriented to person, place, and time. Non focal  Skin: Skin is warm and dry.     Data Reviewed:  There are no new results to review at this time.    Disposition: Status is: Inpatient   Planned Discharge Destination: Skilled nursing facility    Time spent: 35 minutes  Author: Landon FORBES Baller, MD 05/05/2024 9:01 AM  For on call review [email or web address].

## 2024-05-05 NOTE — Plan of Care (Signed)

## 2024-05-05 NOTE — Progress Notes (Signed)
  Received patient in bed to unit.   Informed consent signed and in chart.    TX duration: 3.5hrs     Transported back to floor  Hand-off given to patient's nurse. No c/o and no acute distress noted    Access used: R HD catheter  Access issues: none   Total UF removed: 1.0L Medication(s) given: retacrit  Post HD VS: wnl      Olivia Hurst LPN [facility]

## 2024-05-06 DIAGNOSIS — R531 Weakness: Secondary | ICD-10-CM | POA: Diagnosis not present

## 2024-05-06 LAB — GLUCOSE, CAPILLARY
Glucose-Capillary: 95 mg/dL (ref 70–99)
Glucose-Capillary: 96 mg/dL (ref 70–99)
Glucose-Capillary: 109 mg/dL — ABNORMAL HIGH (ref 70–99)
Glucose-Capillary: 84 mg/dL (ref 70–99)

## 2024-05-06 NOTE — Plan of Care (Signed)

## 2024-05-06 NOTE — Progress Notes (Signed)
 PROGRESS NOTE    Brady Edwards  FMW:[id number] DOB: [birth year] DOA: 04/15/2024 PCP: Vicci Duwaine SQUIBB, DO  [id number]/[id number]-AA  LOS: 17 days   Brief hospital course:   Brady Edwards is a 58 y.o. male with medical history significant of newly ESRD recently started on HD, atrial fibrillation, anxiety and depression, morbid obesity who presented with complaints of weakness and falls.   Pt was very frustrated with his medical and mental problems, and wanted us  to make him better.  Pt complained of fatigue, lack of energy, and weakness, causing to fall several times since he was discharged from hospital on 04/13/24.  No focal neuro deficits.  Pt said he has had long standing mental health issues and needs to see psych.  Pt complained of constipation for the past 2 months that wasn't addressed during his last hospitalization.     Of note, pt was hospitalized from 04/07/24 to 04/13/24 during which he was started on dialysis, and was discharged after dialysis on 8/15 to continue outpatient dialysis TTS.   ED Course: initial vitals: afebrile, pulse 105, BP 86/65, RR 12, sating 99% on room air.  Labs consistent with ESRD.  CXR no acute finding.  EKG showed sinus tachycardia.  CT head no acute finding.  ED provider reported pt said he would be better off not living at this point but does not have a plan for suicide and is going to receive help.  Psych consult placed.  ED provider contacted oncall nephro, who plans dialysis for pt tomorrow.  Assessment & Plan:  Brady Edwards is a 58 y.o. male with medical history significant of recently diagnosed ESRD now on HD, atrial fibrillation, anxiety and depression, morbid obesity who presented with complaints of weakness and falls.    # Orthostatic Hypotension Persistent, symptomatic when going from seated to standing position. No concern for infection. No known neurological conditions that could be contributing. AM cortisol WNL though drawn at 0500.  -- Continue  to hold home Toprol  due to low BP --Continue midodrine  10 mg TID --TED/compression hose abd binder  -- Increase florinef  0.2mg  every day --Fluid intake liberalized per nephro -- Florinef  and midodrine  remain ineffective need to consider alternative agent 8/28 stared steroids, Solu-Medrol  125 mg IV daily for 3 days, PPI for GI prophylaxis and insulin  sliding scale monitor CBG for hyperglycemia 8/30 patient felt improvement with the steroids, still has orthostasis, BP drops at standing for 3 minutes.  But no problem with ambulation. Started Cortef  50 mg in the morning and 25 mg in the evening, started tapering dose over 5 days. Hopefully patient will not need more steroids and blood pressure will improve in few days. 9/2 blood pressure is improving, RN was advised to hold off midodrine  and Florinef  if patient does not needed, follow parameters 9/3 BP is fluctuating and pt was unable to walk in the morning while working with PT   # Weakness and fall  --Fatigue and weakness have been progressive, due to recent hospitalization and decline in health.  CT head wnl, no acute finding. Noted to have elevated BUN (unclear of time line regarding rise in BUN). Also noted to have significant orthostatic hypotension. Suspect this is likely cause of fall.  --PT/OT recommending SNF 8/27 acetylcholine antibodies negative and MuSK antibodies negative, No autoimmune disorder.    # ESRD on HD TTS Secondary hyperparathyroidism Calcitriol  deficiency  --recently started on dialysis --HD per nephro   # Normocytic anemia, mild  Likely anemia of chronic disease  Stable.  Iron panel within normal limits.  B12 within normal limits.  Folate within normal limits -ESA per nephrology     Latest Ref Rng & Units 05/05/2024    7:40 AM 05/03/2024    7:30 AM 05/01/2024    8:14 AM  CBC  WBC 4.0 - 10.5 K/uL 8.9  8.6  8.4   Hemoglobin 13.0 - 17.0 g/dL 8.8  9.1  9.2   Hematocrit 39.0 - 52.0 % 27.8  28.8  28.8   Platelets  150 - 400 K/uL 198  175  229     # Hx of Afib --pt not taking anticoagulation PTA.  Currently in sinus. --hold home Toprol  due to low BP   # Depression and hx of panic disorder # Passive SI --psych consulted --no need for SI precaution, per psych --cont home bupropion  and Effexor   # Vitamin D  deficiency: Continue Calcitrol    # OSA --CPAP nightly   # Obesity, Class III, BMI 40-49.9 (morbid obesity)  Calorie restricted diet and daily exercise advised to lose body weight.  Lifestyle modification discussed.   DVT prophylaxis: Heparin  SQ Code Status: Full code  Family Communication:  Level of care: Med-Surg Dispo:   The patient is from: home Anticipated d/c is to: SNF rehab, patient is currently unhomed as he was evicted recently. TOC consulted. Patient remains hospitalized given persistent severe orthostatic hypotension  Anticipated d/c date is: whenever TOC find a place for him 9/7 awaiting for group home placement.  Subjective and Interval History:  Patient was seen and examined at bedside today. No any active issues overnight.  Still patient has orthostatic hypotension, feels little bit dizziness but able to manage. As per patient somebody will be coming tomorrow to meet him from group home.   Objective: Vitals:   05/05/24 2128 05/06/24 0607 05/06/24 0749 05/06/24 1523  BP: 131/79 106/75 106/71 113/80  Pulse: 83 83 90 67  Resp: 18 18 16 15   Temp: 98.7 F (37.1 C) 98 F (36.7 C) 97.7 F (36.5 C) 98.2 F (36.8 C)  TempSrc:   Oral Oral  SpO2: 97% 99% 99% 100%  Weight:      Height:        Intake/Output Summary (Last 24 hours) at 05/06/2024 1625 Last data filed at 05/06/2024 1300 Gross per 24 hour  Intake 360 ml  Output --  Net 360 ml    Filed Weights   05/03/24 0732 05/03/24 1130 05/05/24 0721  Weight: 121.9 kg 120.4 kg 122 kg    Examination:    Constitutional: In no distress.  Cardiovascular: Normal rate, regular rhythm. No lower extremity edema   Pulmonary: Non labored breathing on room air, no wheezing or rales.   Abdominal: Soft. Non distended and non tender Musculoskeletal: Normal range of motion.     Neurological: Alert and oriented to person, place, and time. Non focal  Skin: Skin is warm and dry.     Data Reviewed: I have personally reviewed labs and imaging studies   Time spent: 35 minutes  Elvan Sor, MD [facility] If 7PM-7AM, please contact night-coverage 05/06/2024, 4:25 PM

## 2024-05-07 DIAGNOSIS — R531 Weakness: Secondary | ICD-10-CM | POA: Diagnosis not present

## 2024-05-07 LAB — GLUCOSE, CAPILLARY
Glucose-Capillary: 100 mg/dL — ABNORMAL HIGH (ref 70–99)
Glucose-Capillary: 90 mg/dL (ref 70–99)
Glucose-Capillary: 74 mg/dL (ref 70–99)
Glucose-Capillary: 83 mg/dL (ref 70–99)

## 2024-05-07 NOTE — Progress Notes (Signed)
 Physical Therapy Treatment Patient Details Name: Brady Edwards MRN: [id number] DOB: 23-Nov-1965 Today's Date: 05/07/2024   History of Present Illness Brady Edwards is a 58 y.o. male with pmh of hypertension, atrial fibrillation on metoprolol  not on anticoagulation, BPH, hyperlipidemia, anxiety, constipation, morbid obesity recently admitted from 8/9-8/15/25 recently initiated on dialysis who presents from home with general weakness, frequent falls since discharge, shortness of breath and passive suicidal ideation.    PT Comments  Pt demonstrated improvement with activity tolerance and was able to ambulate 26ft with SBA for safety. Per secure chat with MD, ACE wraps donned on B lower extremities prior to mobility session. PT and SPT donned abdominal binder, TED hose, and ACE wraps. Pt endorsed mild dizziness with positional changes, however symptoms improved quicker than past sessions. BPs were as follows: sitting 129/52mmmHg (HR: 82bpm), stand: 101/35mmHg (HR:102bpm), 3 min stand: 103/27mmHg (HR:108bpm). Pt demonstrates all bed mobility/transfers/ambulation at baseline level. Pt does not require any further PT needs at this time. Pt will be dc in house and does not require follow up. RN aware. Will dc current orders.           If plan is discharge home, recommend the following: A little help with walking and/or transfers;Direct supervision/assist for medications management;A little help with bathing/dressing/bathroom   Can travel by private vehicle     Yes  Equipment Recommendations  None recommended by PT    Recommendations for Other Services       Precautions / Restrictions Precautions Precautions: Fall Recall of Precautions/Restrictions: Intact Precaution/Restrictions Comments: Monitor BP. Don abdominal binder, TED hose, and ACE wrapping on B LEs Restrictions Weight Bearing Restrictions Per Provider Order: No     Mobility  Bed Mobility Overal bed mobility: Independent              General bed mobility comments: Able to get to EOB independently    Transfers Overall transfer level: Independent Equipment used: None Transfers: Sit to/from Stand Sit to Stand: Independent   Step pivot transfers: Supervision       General transfer comment: Supervision for safety. Pt reports mild dizziness with STS however pt reports improvement of symptoms over time.    Ambulation/Gait Ambulation/Gait assistance: Supervision Gait Distance (Feet): 200 Feet Assistive device: None Gait Pattern/deviations: WFL(Within Functional Limits) Gait velocity: decreased     General Gait Details: No LOB noted with ambulation. Gait pattern is WNL.   Stairs             Wheelchair Mobility     Tilt Bed    Modified Rankin (Stroke Patients Only)       Balance Overall balance assessment: Independent Sitting-balance support: Feet supported Sitting balance-Leahy Scale: Normal Sitting balance - Comments: Able to don and doff sweatpants while sitting at EOB.   Standing balance support: No upper extremity supported, During functional activity Standing balance-Leahy Scale: Normal Standing balance comment: No LOB with standing however endorses mild dizziness that improved with prolonged standing                            Communication Communication Communication: No apparent difficulties  Cognition Arousal: Alert Behavior During Therapy: WFL for tasks assessed/performed   PT - Cognitive impairments: No apparent impairments                       PT - Cognition Comments: Pt is pleasant and agreeable to PT session. Following commands: Intact  Cueing Cueing Techniques: Verbal cues  Exercises Other Exercises Other Exercises: Monitoring BP throughout session    General Comments        Pertinent Vitals/Pain Pain Assessment Pain Assessment: No/denies pain    Home Living                          Prior Function             PT Goals (current goals can now be found in the care plan section) Acute Rehab PT Goals Patient Stated Goal: none stated PT Goal Formulation: With patient Time For Goal Achievement: 05/14/24 Potential to Achieve Goals: Fair Progress towards PT goals: Progressing toward goals    Frequency    Other (Comment) (dc in house from PT. Encouraged to work with mobility specialists)      PT Plan      Co-evaluation              AM-PAC PT 6 Clicks Mobility   Outcome Measure  Help needed turning from your back to your side while in a flat bed without using bedrails?: None Help needed moving from lying on your back to sitting on the side of a flat bed without using bedrails?: None Help needed moving to and from a bed to a chair (including a wheelchair)?: A Little Help needed standing up from a chair using your arms (e.g., wheelchair or bedside chair)?: None Help needed to walk in hospital room?: A Little Help needed climbing 3-5 steps with a railing? : A Little 6 Click Score: 21    End of Session Equipment Utilized During Treatment: Other (comment) (TED hose, abdominal binder, ACE wraps) Activity Tolerance: Patient tolerated treatment well Patient left: in bed;with call bell/phone within reach Nurse Communication: Mobility status PT Visit Diagnosis: Difficulty in walking, not elsewhere classified (R26.2);Dizziness and giddiness (R42)     Time: 8545-8481 PT Time Calculation (min) (ACUTE ONLY): 24 min  Charges:                            Cyruss Arata, SPT    Raylen Tangonan 05/07/2024, 3:42 PM

## 2024-05-07 NOTE — Plan of Care (Signed)
  Problem: Skin Integrity: Goal: Risk for impaired skin integrity will decrease Outcome: Progressing   Problem: Fluid Volume: Goal: Ability to maintain a balanced intake and output will improve Outcome: Progressing   Problem: Skin Integrity: Goal: Risk for impaired skin integrity will decrease Outcome: Progressing

## 2024-05-07 NOTE — Progress Notes (Signed)
 Mobility Specialist Progress Note:    05/07/24 0900  Orthostatic Lying   BP- Lying 131/79  Pulse- Lying 93  Orthostatic Sitting  BP- Sitting 118/76  Pulse- Sitting 134  Orthostatic Standing at 0 minutes  BP- Standing at 0 minutes (!) 88/53  Pulse- Standing at 0 minutes 146  Oxygen Therapy  SpO2 98 %  O2 Device Room Air  Mobility  Activity Stood at bedside;Dangled on edge of bed  Level of Assistance Independent  Assistive Device None  Range of Motion/Exercises Active;All extremities  Mobility visit 1 Mobility  Mobility Specialist Start Time (ACUTE ONLY) 0900  Mobility Specialist Stop Time (ACUTE ONLY) 0913  Mobility Specialist Time Calculation (min) (ACUTE ONLY) 13 min   Pt received in bed. Throughout orthostatic vitals pt symptomatic experiencing dizziness, palpitations, shakiness, and SOB. Deferred ambulation and returned pt supine. Nurse notified, all needs met.  Sherrilee Ditty Mobility Specialist Please contact via Special educational needs teacher or  Rehab office at [phone]

## 2024-05-07 NOTE — Progress Notes (Signed)
 [facility]  ROUNDING NOTE   Subjective:   Patient seen resting in bed Alert and oriented States he continues to have dizziness when he sits at bedside, but after it ends, he is able to ambulate without concern Also was able to sit in chair for 4 hours   Objective:  Vital signs in last 24 hours:  Temp:  [97.6 F (36.4 C)-98.4 F (36.9 C)] 98.1 F (36.7 C) (09/08 0809) Pulse Rate:  [67-85] 72 (09/08 0809) Resp:  [15-20] 18 (09/08 0809) BP: (112-125)/(69-91) 112/70 (09/08 0809) SpO2:  [96 %-100 %] 98 % (09/08 0900)  Weight change:  Filed Weights   05/03/24 0732 05/03/24 1130 05/05/24 0721  Weight: 121.9 kg 120.4 kg 122 kg    Intake/Output: I/O last 3 completed shifts: In: 360 [P.O.:360] Out: -    Intake/Output this shift:  No intake/output data recorded.  Physical Exam: General: NAD, missing teeth, large habitus  Head: Normocephalic  Eyes: Anicteric  Lungs:  Clear, room air  Heart: Regular rate  Abdomen:  Soft, distended   Extremities:  No peripheral edema.  Neurologic: Alert, awake  Skin: No lesions  Access: Rt chest permcath    Basic Metabolic Panel: Recent Labs  Lab 05/01/24 0814 05/03/24 0730 05/05/24 0740  NA 137 136 135  K 4.4 4.4 4.5  CL 98 96* 99  CO2 27 24 26   GLUCOSE 77 89 104*  BUN 46* 36* 34*  CREATININE 11.02* 10.35* 9.37*  CALCIUM  9.0 9.1 8.8*  PHOS 2.6 3.3 4.0    Liver Function Tests: Recent Labs  Lab 05/01/24 0814 05/03/24 0730 05/05/24 0740  ALBUMIN  3.3* 3.1* 3.2*   No results for input(s): LIPASE, AMYLASE in the last 168 hours. No results for input(s): AMMONIA in the last 168 hours.   CBC: Recent Labs  Lab 05/01/24 0814 05/03/24 0730 05/05/24 0740  WBC 8.4 8.6 8.9  NEUTROABS  --   --  4.5  HGB 9.2* 9.1* 8.8*  HCT 28.8* 28.8* 27.8*  MCV 97.0 97.6 98.2  PLT 229 175 198    Cardiac Enzymes: No results for input(s): CKTOTAL, CKMB, CKMBINDEX, TROPONINI in the last 168  hours.  BNP: Invalid input(s): POCBNP  CBG: Recent Labs  Lab 05/06/24 0811 05/06/24 1133 05/06/24 1716 05/06/24 2147 05/07/24 0806  GLUCAP 95 96 109* 84 100*    Microbiology: Results for orders placed or performed during the hospital encounter of 04/07/24  Resp panel by RT-PCR (RSV, Flu A&B, Covid) Anterior Nasal Swab     Status: None   Collection Time: 04/07/24  4:46 PM   Specimen: Anterior Nasal Swab  Result Value Ref Range Status   SARS Coronavirus 2 by RT PCR NEGATIVE NEGATIVE Final    Comment: (NOTE) SARS-CoV-2 target nucleic acids are NOT DETECTED.  The SARS-CoV-2 RNA is generally detectable in upper respiratory specimens during the acute phase of infection. The lowest concentration of SARS-CoV-2 viral copies this assay can detect is 138 copies/mL. A negative result does not preclude SARS-Cov-2 infection and should not be used as the sole basis for treatment or other patient management decisions. A negative result may occur with  improper specimen collection/handling, submission of specimen other than nasopharyngeal swab, presence of viral mutation(s) within the areas targeted by this assay, and inadequate number of viral copies(<138 copies/mL). A negative result must be combined with clinical observations, patient history, and epidemiological information. The expected result is Negative.  Fact Sheet for Patients:  BloggerCourse.com  Fact Sheet for Healthcare Providers:  SeriousBroker.it  This test is no t yet approved or cleared by the [place]  FDA and  has been authorized for detection and/or diagnosis of SARS-CoV-2 by FDA under an Emergency Use Authorization (EUA). This EUA will remain  in effect (meaning this test can be used) for the duration of the COVID-19 declaration under Section 564(b)(1) of the Act, 21 U.S.C.section 360bbb-3(b)(1), unless the authorization is terminated  or revoked sooner.        Influenza A by PCR NEGATIVE NEGATIVE Final   Influenza B by PCR NEGATIVE NEGATIVE Final    Comment: (NOTE) The Xpert Xpress SARS-CoV-2/FLU/RSV plus assay is intended as an aid in the diagnosis of influenza from Nasopharyngeal swab specimens and should not be used as a sole basis for treatment. Nasal washings and aspirates are unacceptable for Xpert Xpress SARS-CoV-2/FLU/RSV testing.  Fact Sheet for Patients: BloggerCourse.com  Fact Sheet for Healthcare Providers: SeriousBroker.it  This test is not yet approved or cleared by the [place]  FDA and has been authorized for detection and/or diagnosis of SARS-CoV-2 by FDA under an Emergency Use Authorization (EUA). This EUA will remain in effect (meaning this test can be used) for the duration of the COVID-19 declaration under Section 564(b)(1) of the Act, 21 U.S.C. section 360bbb-3(b)(1), unless the authorization is terminated or revoked.     Resp Syncytial Virus by PCR NEGATIVE NEGATIVE Final    Comment: (NOTE) Fact Sheet for Patients: BloggerCourse.com  Fact Sheet for Healthcare Providers: SeriousBroker.it  This test is not yet approved or cleared by the [place]  FDA and has been authorized for detection and/or diagnosis of SARS-CoV-2 by FDA under an Emergency Use Authorization (EUA). This EUA will remain in effect (meaning this test can be used) for the duration of the COVID-19 declaration under Section 564(b)(1) of the Act, 21 U.S.C. section 360bbb-3(b)(1), unless the authorization is terminated or revoked.  Performed at [facility], [address] [place], [place] [address]     Coagulation Studies: No results for input(s): LABPROT, INR in the last 72 hours.  Urinalysis: No results for input(s): COLORURINE, LABSPEC, PHURINE, GLUCOSEU, HGBUR, BILIRUBINUR, KETONESUR,  PROTEINUR, UROBILINOGEN, NITRITE, LEUKOCYTESUR in the last 72 hours.  Invalid input(s): APPERANCEUR    Imaging: No results found.   Medications:     bisacodyl   10 mg Oral QHS   buPROPion   300 mg Oral Daily   calcitRIOL   0.25 mcg Oral Daily   Chlorhexidine  Gluconate Cloth  6 each Topical Daily   epoetin  alfa-epbx (RETACRIT ) injection  4,000 Units Intravenous Q T,Th,Sat-1800   fludrocortisone   0.4 mg Oral Daily   heparin   5,000 Units Subcutaneous Q8H   insulin  aspart  0-15 Units Subcutaneous TID WC   midodrine   10 mg Oral TID WC   polyethylene glycol  17 g Oral BID   sevelamer  carbonate  1,600 mg Oral TID WC   venlafaxine  XR  150 mg Oral Q breakfast   And   venlafaxine  XR  75 mg Oral Q breakfast   acetaminophen , bisacodyl , ondansetron  (ZOFRAN ) IV, ondansetron   Assessment/ Plan:  Brady Edwards is a 58 y.o.  male  with a PMHx of hypertension, atrial fibrillation, BPH, hyperlipidemia, anxiety, constipation, morbid obesity, and end stage renal disease on hemodialysis.    [facility]- will need to resubmit for chair at discharge.    End stage renal disease on hemodialysis         Next treatment scheduled for Tuesday. Primary team seeking appropriate discharge plan.  2.  Acute metabolic acidosis.  Serum bicarbonate 16 on admission. Corrected and management continues with dialysis   3.  Anemia of chronic kidney disease. Hgb 8.8 on [place], continue retacrit  4000 units with dialysis.     4.  Secondary hyperparathyroidism. PTH 199 during recent admission. Continue Renvela  2 tabs TID.   Calcium  and phos within desired range for this patient.   5. Severe orthostatic hypotension Workup so far has included 2D echo from 04/11/2024 which shows LVEF 65 to 70%, normal LV systolic function, mild concentric LVH, normal diastolic parameters, normal right ventricular systolic function.  A.m. cortisol level normal from 04/24/2024.  TSH normal.  Other differential  includes amyloidosis.  Kidney biopsy or fat pad biopsy can be considered. Given steroids. Continue TEDs, midodrine  and abd binders. Florinef  to 0.4mg  daily for blood pressure support.      LOS: 18 Jasime Westergren 9/8/202510:11 AM

## 2024-05-07 NOTE — [place] Notes (Signed)
 PROGRESS NOTE    Brady Edwards  FMW:[id number] DOB: [birth year] DOA: 04/15/2024 PCP: Vicci Duwaine SQUIBB, DO  147A/[id number]-AA  LOS: 18 days   Brief hospital course:   Brady Edwards is a 58 y.o. male with medical history significant of newly ESRD recently started on HD, atrial fibrillation, anxiety and depression, morbid obesity who presented with complaints of weakness and falls.   Pt was very frustrated with his medical and mental problems, and wanted us  to make him better.  Pt complained of fatigue, lack of energy, and weakness, causing to fall several times since he was discharged from hospital on 04/13/24.  No focal neuro deficits.  Pt said he has had long standing mental health issues and needs to see psych.  Pt complained of constipation for the past 2 months that wasn't addressed during his last hospitalization.     Of note, pt was hospitalized from 04/07/24 to 04/13/24 during which he was started on dialysis, and was discharged after dialysis on 8/15 to continue outpatient dialysis TTS.   ED Course: initial vitals: afebrile, pulse 105, BP 86/65, RR 12, sating 99% on room air.  Labs consistent with ESRD.  CXR no acute finding.  EKG showed sinus tachycardia.  CT head no acute finding.  ED provider reported pt said he would be better off not living at this point but does not have a plan for suicide and is going to receive help.  Psych consult placed.  ED provider contacted oncall nephro, who plans dialysis for pt tomorrow.  Assessment & Plan:  Brady Edwards is a 58 y.o. male with medical history significant of recently diagnosed ESRD now on HD, atrial fibrillation, anxiety and depression, morbid obesity who presented with complaints of weakness and falls.    # Orthostatic Hypotension Persistent, symptomatic when going from seated to standing position. No concern for infection. No known neurological conditions that could be contributing. AM cortisol WNL though drawn at 0500.  -- Continue  to hold home Toprol  due to low BP --Continue midodrine  10 mg TID --TED/compression hose abd binder  -- Increase florinef  0.2mg  every day --Fluid intake liberalized per nephro -- Florinef  and midodrine  remain ineffective need to consider alternative agent 8/28 stared steroids, Solu-Medrol  125 mg IV daily for 3 days, PPI for GI prophylaxis and insulin  sliding scale monitor CBG for hyperglycemia 8/30 patient felt improvement with the steroids, still has orthostasis, BP drops at standing for 3 minutes.  But no problem with ambulation. Started Cortef  50 mg in the morning and 25 mg in the evening, started tapering dose over 5 days. Hopefully patient will not need more steroids and blood pressure will improve in few days. 9/2 blood pressure is improving, RN was advised to hold off midodrine  and Florinef  if patient does not needed, follow parameters 9/3 BP is fluctuating and pt was unable to walk in the morning while working with PT   # Weakness and fall  --Fatigue and weakness have been progressive, due to recent hospitalization and decline in health.  CT head wnl, no acute finding. Noted to have elevated BUN (unclear of time line regarding rise in BUN). Also noted to have significant orthostatic hypotension. Suspect this is likely cause of fall.  --PT/OT recommending SNF 8/27 acetylcholine antibodies negative and MuSK antibodies negative, No autoimmune disorder.    # ESRD on HD TTS Secondary hyperparathyroidism Calcitriol  deficiency  --recently started on dialysis --HD per nephro   # Normocytic anemia, mild  Likely anemia of chronic disease  Stable.  Iron panel within normal limits.  B12 within normal limits.  Folate within normal limits -ESA per nephrology     Latest Ref Rng & Units 05/05/2024    7:40 AM 05/03/2024    7:30 AM 05/01/2024    8:14 AM  CBC  WBC 4.0 - 10.5 K/uL 8.9  8.6  8.4   Hemoglobin 13.0 - 17.0 g/dL 8.8  9.1  9.2   Hematocrit 39.0 - 52.0 % 27.8  28.8  28.8   Platelets  150 - 400 K/uL 198  175  229     # Hx of Afib --pt not taking anticoagulation PTA.  Currently in sinus. --hold home Toprol  due to low BP   # Depression and hx of panic disorder # Passive SI --psych consulted --no need for SI precaution, per psych --cont home bupropion  and Effexor   # Vitamin D  deficiency: Continue Calcitrol    # OSA --CPAP nightly   # Obesity, Class III, BMI 40-49.9 (morbid obesity)  Calorie restricted diet and daily exercise advised to lose body weight.  Lifestyle modification discussed.   DVT prophylaxis: Heparin  SQ Code Status: Full code  Family Communication:  Level of care: Med-Surg Dispo:   The patient is from: home Anticipated d/c is to: SNF rehab, patient is currently unhomed as he was evicted recently. TOC consulted. Patient remains hospitalized given persistent severe orthostatic hypotension  Anticipated d/c date is: whenever TOC find a place for him 9/7 awaiting for group home placement.  Subjective and Interval History:  Patient was seen and examined at bedside today. No any active issues overnight.  Patient still has orthostatic hypotension and feels little bit dizziness while standing.  Overall feeling better. Patient is awaiting for group home evaluation.   Objective: Vitals:   05/07/24 0529 05/07/24 0809 05/07/24 0900 05/07/24 1311  BP: 125/86 112/70  114/77  Pulse: 85 72  95  Resp: 18 18    Temp: 97.6 F (36.4 C) 98.1 F (36.7 C)    TempSrc: Oral Oral    SpO2: 99% 96% 98% 99%  Weight:      Height:        Intake/Output Summary (Last 24 hours) at 05/07/2024 1549 Last data filed at 05/07/2024 1300 Gross per 24 hour  Intake 360 ml  Output --  Net 360 ml    Filed Weights   05/03/24 0732 05/03/24 1130 05/05/24 0721  Weight: 121.9 kg 120.4 kg 122 kg    Examination:    Constitutional: In no distress.  Cardiovascular: Normal rate, regular rhythm. No lower extremity edema  Pulmonary: Non labored breathing on room air, no  wheezing or rales.   Abdominal: Soft. Non distended and non tender Musculoskeletal: Normal range of motion.     Neurological: Alert and oriented to person, place, and time. Non focal  Skin: Skin is warm and dry.     Data Reviewed: I have personally reviewed labs and imaging studies   Time spent: 35 minutes  Elvan Sor, MD [facility] If 7PM-7AM, please contact night-coverage 05/07/2024, 3:49 PM

## 2024-05-07 NOTE — Plan of Care (Signed)

## 2024-05-08 DIAGNOSIS — R531 Weakness: Secondary | ICD-10-CM | POA: Diagnosis not present

## 2024-05-08 LAB — BASIC METABOLIC PANEL WITH GFR
Anion gap: 12 (ref 5–15)
BUN: 37 mg/dL — ABNORMAL HIGH (ref 6–20)
CO2: 24 mmol/L (ref 22–32)
Calcium: 8.5 mg/dL — ABNORMAL LOW (ref 8.9–10.3)
Chloride: 101 mmol/L (ref 98–111)
Creatinine, Ser: 10.26 mg/dL — ABNORMAL HIGH (ref 0.61–1.24)
GFR, Estimated: 5 mL/min — ABNORMAL LOW (ref >60)
Glucose, Bld: 78 mg/dL (ref 70–99)
Potassium: 3.8 mmol/L (ref 3.5–5.1)
Sodium: 137 mmol/L (ref 135–145)

## 2024-05-08 LAB — CBC
HCT: 27.7 % — ABNORMAL LOW (ref 39.0–52.0)
Hemoglobin: 8.8 g/dL — ABNORMAL LOW (ref 13.0–17.0)
MCH: 31.1 pg (ref 26.0–34.0)
MCHC: 31.8 g/dL (ref 30.0–36.0)
MCV: 97.9 fL (ref 80.0–100.0)
Platelets: 195 K/uL (ref 150–400)
RBC: 2.83 MIL/uL — ABNORMAL LOW (ref 4.22–5.81)
RDW: 13.9 % (ref 11.5–15.5)
WBC: 8.1 K/uL (ref 4.0–10.5)
nRBC: 0 % (ref 0.0–0.2)

## 2024-05-08 LAB — GLUCOSE, CAPILLARY
Glucose-Capillary: 69 mg/dL — ABNORMAL LOW (ref 70–99)
Glucose-Capillary: 109 mg/dL — ABNORMAL HIGH (ref 70–99)
Glucose-Capillary: 137 mg/dL — ABNORMAL HIGH (ref 70–99)

## 2024-05-08 LAB — PHOSPHORUS: Phosphorus: 4.2 mg/dL (ref 2.5–4.6)

## 2024-05-08 LAB — HEPATITIS B SURFACE ANTIGEN: Hepatitis B Surface Ag: NONREACTIVE

## 2024-05-08 LAB — MAGNESIUM: Magnesium: 2.6 mg/dL — ABNORMAL HIGH (ref 1.7–2.4)

## 2024-05-08 MED ORDER — EPOETIN ALFA-EPBX 4000 UNIT/ML IJ SOLN
INTRAMUSCULAR | Status: AC
  Filled 2024-05-08: qty 1

## 2024-05-08 MED ORDER — HEPARIN SODIUM (PORCINE) 1000 UNIT/ML IJ SOLN
INTRAMUSCULAR | Status: AC
  Filled 2024-05-08: qty 5

## 2024-05-08 MED ORDER — HEPARIN SODIUM (PORCINE) 1000 UNIT/ML IJ SOLN
4200.0000 [IU] | Freq: Once | INTRAMUSCULAR | Status: AC
  Administered 2024-05-22: 4200 [IU]

## 2024-05-08 NOTE — [place] Notes (Signed)
 [facility]  ROUNDING NOTE   Subjective:   Patient seen and evaluated during dialysis   HEMODIALYSIS FLOWSHEET:  Blood Flow Rate (mL/min): 399 mL/min Arterial Pressure (mmHg): -237.77 mmHg Venous Pressure (mmHg): 177.16 mmHg TMP (mmHg): 7.88 mmHg Ultrafiltration Rate (mL/min): 400 mL/min Dialysate Flow Rate (mL/min): 300 ml/min Dialysis Fluid Bolus: Albumin  Bolus Amount (mL): 100 mL  Denies pain Was able to ambulate yesterday with teds and abd binder  Objective:  Vital signs in last 24 hours:  Temp:  [98.2 F (36.8 C)-98.8 F (37.1 C)] 98.2 F (36.8 C) (09/09 0415) Pulse Rate:  [61-96] 66 (09/09 0930) Resp:  [16-24] 16 (09/09 0930) BP: (98-125)/(52-92) 125/92 (09/09 0930) SpO2:  [96 %-100 %] 99 % (09/09 0930)  Weight change:  Filed Weights   05/03/24 0732 05/03/24 1130 05/05/24 0721  Weight: 121.9 kg 120.4 kg 122 kg    Intake/Output: I/O last 3 completed shifts: In: 360 [P.O.:360] Out: -    Intake/Output this shift:  No intake/output data recorded.  Physical Exam: General: NAD, missing teeth, large habitus  Head: Normocephalic  Eyes: Anicteric  Lungs:  Clear, room air  Heart: Regular rate  Abdomen:  Soft, distended   Extremities:  No peripheral edema.  Neurologic: Alert, awake  Skin: No lesions  Access: Rt chest permcath    Basic Metabolic Panel: Recent Labs  Lab 05/03/24 0730 05/05/24 0740 05/08/24 0736 05/08/24 0830  NA 136 135  --  137  K 4.4 4.5  --  3.8  CL 96* 99  --  101  CO2 24 26  --  24  GLUCOSE 89 104*  --  78  BUN 36* 34*  --  37*  CREATININE 10.35* 9.37*  --  10.26*  CALCIUM  9.1 8.8*  --  8.5*  MG  --   --  2.6*  --   PHOS 3.3 4.0  --  4.2    Liver Function Tests: Recent Labs  Lab 05/03/24 0730 05/05/24 0740  ALBUMIN  3.1* 3.2*   No results for input(s): LIPASE, AMYLASE in the last 168 hours. No results for input(s): AMMONIA in the last 168 hours.   CBC: Recent Labs  Lab 05/03/24 0730  05/05/24 0740 05/08/24 0736  WBC 8.6 8.9 8.1  NEUTROABS  --  4.5  --   HGB 9.1* 8.8* 8.8*  HCT 28.8* 27.8* 27.7*  MCV 97.6 98.2 97.9  PLT 175 198 195    Cardiac Enzymes: No results for input(s): CKTOTAL, CKMB, CKMBINDEX, TROPONINI in the last 168 hours.  BNP: Invalid input(s): POCBNP  CBG: Recent Labs  Lab 05/06/24 2147 05/07/24 0806 05/07/24 1229 05/07/24 1711 05/07/24 2051  GLUCAP 84 100* 90 74 83    Microbiology: Results for orders placed or performed during the hospital encounter of 04/07/24  Resp panel by RT-PCR (RSV, Flu A&B, Covid) Anterior Nasal Swab     Status: None   Collection Time: 04/07/24  4:46 PM   Specimen: Anterior Nasal Swab  Result Value Ref Range Status   SARS Coronavirus 2 by RT PCR NEGATIVE NEGATIVE Final    Comment: (NOTE) SARS-CoV-2 target nucleic acids are NOT DETECTED.  The SARS-CoV-2 RNA is generally detectable in upper respiratory specimens during the acute phase of infection. The lowest concentration of SARS-CoV-2 viral copies this assay can detect is 138 copies/mL. A negative result does not preclude SARS-Cov-2 infection and should not be used as the sole basis for treatment or other patient management decisions. A negative result may occur with  improper specimen collection/handling, submission of specimen other than nasopharyngeal swab, presence of viral mutation(s) within the areas targeted by this assay, and inadequate number of viral copies(<138 copies/mL). A negative result must be combined with clinical observations, patient history, and epidemiological information. The expected result is Negative.  Fact Sheet for Patients:  BloggerCourse.com  Fact Sheet for Healthcare Providers:  SeriousBroker.it  This test is no t yet approved or cleared by the [place]  FDA and  has been authorized for detection and/or diagnosis of SARS-CoV-2 by FDA under an Emergency Use  Authorization (EUA). This EUA will remain  in effect (meaning this test can be used) for the duration of the COVID-19 declaration under Section 564(b)(1) of the Act, 21 U.S.C.section 360bbb-3(b)(1), unless the authorization is terminated  or revoked sooner.       Influenza A by PCR NEGATIVE NEGATIVE Final   Influenza B by PCR NEGATIVE NEGATIVE Final    Comment: (NOTE) The Xpert Xpress SARS-CoV-2/FLU/RSV plus assay is intended as an aid in the diagnosis of influenza from Nasopharyngeal swab specimens and should not be used as a sole basis for treatment. Nasal washings and aspirates are unacceptable for Xpert Xpress SARS-CoV-2/FLU/RSV testing.  Fact Sheet for Patients: BloggerCourse.com  Fact Sheet for Healthcare Providers: SeriousBroker.it  This test is not yet approved or cleared by the [place]  FDA and has been authorized for detection and/or diagnosis of SARS-CoV-2 by FDA under an Emergency Use Authorization (EUA). This EUA will remain in effect (meaning this test can be used) for the duration of the COVID-19 declaration under Section 564(b)(1) of the Act, 21 U.S.C. section 360bbb-3(b)(1), unless the authorization is terminated or revoked.     Resp Syncytial Virus by PCR NEGATIVE NEGATIVE Final    Comment: (NOTE) Fact Sheet for Patients: BloggerCourse.com  Fact Sheet for Healthcare Providers: SeriousBroker.it  This test is not yet approved or cleared by the [place]  FDA and has been authorized for detection and/or diagnosis of SARS-CoV-2 by FDA under an Emergency Use Authorization (EUA). This EUA will remain in effect (meaning this test can be used) for the duration of the COVID-19 declaration under Section 564(b)(1) of the Act, 21 U.S.C. section 360bbb-3(b)(1), unless the authorization is terminated or revoked.  Performed at [facility], [address] [place], [place] [address]     Coagulation Studies: No results for input(s): LABPROT, INR in the last 72 hours.  Urinalysis: No results for input(s): COLORURINE, LABSPEC, PHURINE, GLUCOSEU, HGBUR, BILIRUBINUR, KETONESUR, PROTEINUR, UROBILINOGEN, NITRITE, LEUKOCYTESUR in the last 72 hours.  Invalid input(s): APPERANCEUR    Imaging: No results found.   Medications:     bisacodyl   10 mg Oral QHS   buPROPion   300 mg Oral Daily   calcitRIOL   0.25 mcg Oral Daily   Chlorhexidine  Gluconate Cloth  6 each Topical Daily   epoetin  alfa-epbx (RETACRIT ) injection  4,000 Units Intravenous Q T,Th,Sat-1800   fludrocortisone   0.4 mg Oral Daily   heparin   5,000 Units Subcutaneous Q8H   insulin  aspart  0-15 Units Subcutaneous TID WC   midodrine   10 mg Oral TID WC   polyethylene glycol  17 g Oral BID   sevelamer  carbonate  1,600 mg Oral TID WC   venlafaxine  XR  150 mg Oral Q breakfast   And   venlafaxine  XR  75 mg Oral Q breakfast   acetaminophen , bisacodyl , ondansetron  (ZOFRAN ) IV, ondansetron   Assessment/ Plan:  [name] is a 58 y.o.  male  with a PMHx  of hypertension, atrial fibrillation, BPH, hyperlipidemia, anxiety, constipation, morbid obesity, and end stage renal disease on hemodialysis.    [facility]- will need to resubmit for chair at discharge.    End stage renal disease on hemodialysis        Receiving dialysis today, UF 0.5L as tolerated. Next treatment scheduled for Thursday.    2.  Acute metabolic acidosis.  Serum bicarbonate 16 on admission. Corrected and management continues with dialysis   3.  Anemia of chronic kidney disease. Hgb remains 8.8, continue retacrit  4000 units with dialysis.     4.  Secondary hyperparathyroidism. PTH 199 during recent admission. Continue Renvela  2 tabs TID.   Calcium  and phos within desired range for this patient.   5. Severe orthostatic hypotension Workup so far has included 2D echo  from 04/11/2024 which shows LVEF 65 to 70%, normal LV systolic function, mild concentric LVH, normal diastolic parameters, normal right ventricular systolic function.  A.m. cortisol level normal from 04/24/2024.  TSH normal.  Other differential includes amyloidosis.  Kidney biopsy or fat pad biopsy can be considered. Given steroids. Continue TEDs, midodrine  and abd binders. Florinef  to 0.4mg  daily for blood pressure support.      LOS: 19 Schneur Crowson 9/9/202511:09 AM

## 2024-05-08 NOTE — [place] Notes (Signed)
   05/08/24 1212  Vitals  BP 138/85  Pulse Rate 94  Resp 19  Weight 121 kg  Type of Weight Post-Dialysis  Oxygen Therapy  SpO2 100 %  O2 Device Nasal Cannula  Patient Activity (if Appropriate) In bed  Pulse Oximetry Type Continuous  Oximetry Probe Site Changed No  Post Treatment  Dialyzer Clearance Lightly streaked  Liters Processed 72.8  Fluid Removed (mL) 500 mL  Tolerated HD Treatment Yes  Post-Hemodialysis Comments Treatment has been tolerated. Goal has been met. Vs are stable.Blood pressure is greater than 120 systolic. No verbalized concerns post tx  Hemodialysis Catheter Right Internal jugular Double lumen Permanent (Tunneled)  Placement Date/Time: 04/12/24 1119   Serial / Lot #: [id number]  Expiration Date: 01/07/27  Time Out: Correct patient;Correct site;Correct procedure  Maximum sterile barrier precautions: Hand hygiene;Cap;Mask;Sterile gown;Sterile gloves;Large sterile ...  Site Condition No complications  Blue Lumen Status Flushed;Dead end cap in place;Heparin  locked  Red Lumen Status Flushed;Dead end cap in place;Heparin  locked  Purple Lumen Status N/A  Catheter fill solution Heparin  1000 units/ml  Catheter fill volume (Arterial) 2 cc  Catheter fill volume (Venous) 2.2  Dressing Type Transparent  Dressing Status Antimicrobial disc/dressing in place;Clean, Dry, Intact;Clean;Dry  Interventions New dressing;Dressing changed;Antimicrobial disc changed  Dressing Change Due 05/15/24  Post treatment catheter status Capped and Clamped

## 2024-05-08 NOTE — [place] Notes (Signed)
 [place]ESS NOTE    Brady Edwards  FMW:[id number] DOB: 15-Jul-1966 DOA: 04/15/2024 PCP: Vicci Duwaine SQUIBB, DO  147A/147A-AA  LOS: 19 days   Brief hospital course:   Brady Edwards is a 58 y.o. male with medical history significant of newly ESRD recently started on HD, atrial fibrillation, anxiety and depression, morbid obesity who presented with complaints of weakness and falls.   Pt was very frustrated with his medical and mental problems, and wanted us  to make him better.  Pt complained of fatigue, lack of energy, and weakness, causing to fall several times since he was discharged from hospital on 04/13/24.  No focal neuro deficits.  Pt said he has had long standing mental health issues and needs to see psych.  Pt complained of constipation for the past 2 months that wasn't addressed during his last hospitalization.     Of note, pt was hospitalized from 04/07/24 to 04/13/24 during which he was started on dialysis, and was discharged after dialysis on 8/15 to continue outpatient dialysis TTS.   ED Course: initial vitals: afebrile, pulse 105, BP 86/65, RR 12, sating 99% on room air.  Labs consistent with ESRD.  CXR no acute finding.  EKG showed sinus tachycardia.  CT head no acute finding.  ED provider reported pt said he would be better off not living at this point but does not have a plan for suicide and is going to receive help.  Psych consult placed.  ED provider contacted oncall nephro, who plans dialysis for pt tomorrow.  Assessment & Plan:  Brady Edwards is a 58 y.o. male with medical history significant of recently diagnosed ESRD now on HD, atrial fibrillation, anxiety and depression, morbid obesity who presented with complaints of weakness and falls.    # Orthostatic Hypotension Persistent, symptomatic when going from seated to standing position. No concern for infection. No known neurological conditions that could be contributing. AM cortisol WNL though drawn at 0500.  -- Continue  to hold home Toprol  due to low BP --Continue midodrine  10 mg TID --TED/compression hose abd binder  -- Increase florinef  0.2mg  every day --Fluid intake liberalized per nephro -- Florinef  and midodrine  remain ineffective need to consider alternative agent 8/28 stared steroids, Solu-Medrol  125 mg IV daily for 3 days, PPI for GI prophylaxis and insulin  sliding scale monitor CBG for hyperglycemia 8/30 patient felt improvement with the steroids, still has orthostasis, BP drops at standing for 3 minutes.  But no problem with ambulation. Started Cortef  50 mg in the morning and 25 mg in the evening, started tapering dose over 5 days. Hopefully patient will not need more steroids and blood pressure will improve in few days. 9/2 blood pressure is improving, RN was advised to hold off midodrine  and Florinef  if patient does not needed, follow parameters 9/3 BP is fluctuating and pt was unable to walk in the morning while working with PT   # Weakness and fall  --Fatigue and weakness have been progressive, due to recent hospitalization and decline in health.  CT head wnl, no acute finding. Noted to have elevated BUN (unclear of time line regarding rise in BUN). Also noted to have significant orthostatic hypotension. Suspect this is likely cause of fall.  --PT/OT recommending SNF 8/27 acetylcholine antibodies negative and MuSK antibodies negative, No autoimmune disorder.    # ESRD on HD TTS Secondary hyperparathyroidism Calcitriol  deficiency  --recently started on dialysis --HD per nephro   # Normocytic anemia, mild  Likely anemia of chronic disease  Stable.  Iron panel within normal limits.  B12 within normal limits.  Folate within normal limits -ESA per nephrology     Latest Ref Rng & Units 05/08/2024    7:36 AM 05/05/2024    7:40 AM 05/03/2024    7:30 AM  CBC  WBC 4.0 - 10.5 K/uL 8.1  8.9  8.6   Hemoglobin 13.0 - 17.0 g/dL 8.8  8.8  9.1   Hematocrit 39.0 - 52.0 % 27.7  27.8  28.8   Platelets  150 - 400 K/uL 195  198  175     # Hx of Afib --pt not taking anticoagulation PTA.  Currently in sinus. --hold home Toprol  due to low BP   # Depression and hx of panic disorder # Passive SI --psych consulted --no need for SI precaution, per psych --cont home bupropion  and Effexor   # Vitamin D  deficiency: Continue Calcitrol    # OSA --CPAP nightly   # Obesity, Class III, BMI 40-49.9 (morbid obesity)  Calorie restricted diet and daily exercise advised to lose body weight.  Lifestyle modification discussed.   DVT prophylaxis: Heparin  SQ Code Status: Full code  Family Communication:  Level of care: Med-Surg Dispo:   The patient is from: home Anticipated d/c is to: SNF rehab, patient is currently unhomed as he was evicted recently. TOC consulted. Patient remains hospitalized given persistent severe orthostatic hypotension  Anticipated d/c date is: whenever TOC find a place for him 9/7 awaiting for group home placement.  Subjective and Interval History:  Patient was seen and examined at bedside today. No any active issues overnight.  Patient was seen after hemodialysis, tolerated well.  In the morning time he was able to use the bathroom without any significant symptoms but after hemodialysis he has not got up and walk yet. Patient is still awaiting to be seen by group home.   Objective: Vitals:   05/08/24 1130 05/08/24 1200 05/08/24 1212 05/08/24 1253  BP: 132/87 136/78 138/85 135/86  Pulse: (!) 102 92 94 82  Resp:  18 19 17   Temp:    98 F (36.7 C)  TempSrc:      SpO2: 100% 100% 100% 97%  Weight:   121 kg   Height:        Intake/Output Summary (Last 24 hours) at 05/08/2024 1527 Last data filed at 05/08/2024 1212 Gross per 24 hour  Intake 120 ml  Output 500 ml  Net -380 ml    Filed Weights   05/03/24 1130 05/05/24 0721 05/08/24 1212  Weight: 120.4 kg 122 kg 121 kg    Examination:    Constitutional: In no distress.  Cardiovascular: Normal rate, regular  rhythm. No lower extremity edema  Pulmonary: Non labored breathing on room air, no wheezing or rales.   Abdominal: Soft. Non distended and non tender Musculoskeletal: Normal range of motion.     Neurological: Alert and oriented to person, place, and time. Non focal  Skin: Skin is warm and dry.     Data Reviewed: I have personally reviewed labs and imaging studies   Time spent: 35 minutes  Brady Sor, MD [facility] If 7PM-7AM, please contact night-coverage 05/08/2024, 3:27 PM

## 2024-05-08 NOTE — [place] Notes (Addendum)
 Mobility Specialist [place] Note:    05/08/24 1523  Orthostatic Lying   BP- Lying 116/81  Pulse- Lying 106  Orthostatic Sitting  BP- Sitting 105/73  Pulse- Sitting 130  Orthostatic Standing at 0 minutes  BP- Standing at 0 minutes (!) 84/54  Pulse- Standing at 0 minutes 150  Mobility  Activity Stood at bedside  Level of Assistance Independent  Assistive Device None  Range of Motion/Exercises Active;All extremities  Activity Response Tolerated well  Mobility visit 1 Mobility  Mobility Specialist Start Time (ACUTE ONLY) 1520  Mobility Specialist Stop Time (ACUTE ONLY) 1540  Mobility Specialist Time Calculation (min) (ACUTE ONLY) 20 min   Upon arrival, pt has ted hose and ace wraps already placed bilaterally. Abdominal binder was placed on pt for mobility session. Sitting EOB pt c/o dizziness that subsided after sitting for 2-4 minutes. HR recovered to 116 bpm as well. Upon standing, pt c/o more severe dizziness stating that he could not stand for long. Ambulation was deferred d/t pt being symptomatic throughout orthostatic vitals. Returned supine, HR recovered to 104 bpm after 2 minutes. Symptoms subsided. Will attempt further mobility at a later time when vitals are stable and pt is asx.   Sherrilee Ditty Mobility Specialist Please contact via Special educational needs teacher or  Rehab office at [phone]

## 2024-05-08 NOTE — Plan of Care (Signed)
  Problem: Education: Goal: Knowledge of General Education information will improve Description: Including pain rating scale, medication(s)/side effects and non-pharmacologic comfort measures Outcome: Progressing   Problem: Health Behavior/Discharge Planning: Goal: Ability to manage health-related needs will improve Outcome: Progressing   Problem: Clinical Measurements: Goal: Will remain free from infection Outcome: Progressing   Problem: Activity: Goal: Risk for activity intolerance will decrease Outcome: Progressing   Problem: Nutrition: Goal: Adequate nutrition will be maintained Outcome: Progressing   Problem: Elimination: Goal: Will not experience complications related to bowel motility Outcome: Progressing Goal: Will not experience complications related to urinary retention Outcome: Progressing   Problem: Pain Managment: Goal: General experience of comfort will improve and/or be controlled Outcome: Progressing   Problem: Skin Integrity: Goal: Risk for impaired skin integrity will decrease Outcome: Progressing

## 2024-05-09 DIAGNOSIS — R531 Weakness: Secondary | ICD-10-CM | POA: Diagnosis not present

## 2024-05-09 LAB — GLUCOSE, CAPILLARY
Glucose-Capillary: 92 mg/dL (ref 70–99)
Glucose-Capillary: 95 mg/dL (ref 70–99)
Glucose-Capillary: 94 mg/dL (ref 70–99)
Glucose-Capillary: 102 mg/dL — ABNORMAL HIGH (ref 70–99)

## 2024-05-09 NOTE — Progress Notes (Addendum)
 Mobility Specialist Progress Note:    05/09/24 0920  Orthostatic Lying   BP- Lying 112/60  Pulse- Lying 100  Orthostatic Sitting  BP- Sitting 113/73  Pulse- Sitting 126  Orthostatic Standing at 0 minutes  BP- Standing at 0 minutes (!) 89/64  Pulse- Standing at 0 minutes 135  Orthostatic Standing at 3 minutes  BP- Standing at 3 minutes (!) 88/54  Pulse- Standing at 3 minutes 138  Oxygen Therapy  SpO2 98 %  O2 Device Room Air  Mobility  Activity Ambulated with assistance  Level of Assistance Independent  Assistive Device None  Distance Ambulated (ft) 75 ft  Range of Motion/Exercises Active;All extremities  Activity Response Tolerated well  Mobility visit 1 Mobility  Mobility Specialist Start Time (ACUTE ONLY) [id number]  Mobility Specialist Stop Time (ACUTE ONLY) [id number]  Mobility Specialist Time Calculation (min) (ACUTE ONLY) 26 min   Pt received in bed, this mobility specialist placed ted hose, ace wraps, and abdominal binder before mobility session. Pt c/o mild lightheadedness upon standing. Returned pt sitting, BP 115/85 after seated rest break and 90/60 w/ HR 112 bpm after standing again. Pt denies dizziness and other symptoms at that time and felt comfortable to ambulate. Tolerated well, denies dizziness throughout ambulation. Pt only c/o SOB and fatigue after session. Returned supine, BP 122/77 and HR 115 bpm. SpO2 in the high 90's throughout session as well.  Sherrilee Ditty Mobility Specialist Please contact via Special educational needs teacher or  Rehab office at [phone]

## 2024-05-09 NOTE — TOC Progression Note (Signed)
 Transition of Care [facility]) - Progression Note    Patient Details  Name: Brady Edwards MRN: [id number] Date of Birth: 1966-05-17  Transition of [facility]) CM/SW Contact  Alvaro Louder, [place] Phone Number: 05/09/2024, 1:13 PM  Clinical Narrative:   LCSWA spoke to [facility] Group home coordinator. He indicated that he was going to come yesterday, but did not make it. LCSWA followed up with him today and Rosana indicated that he wou\ld be here at 1:00 PM to assess the patient. LCSWA to follow up with patient after meeting with Rosana SINNING to follow for discharge                    Expected Discharge Plan and Services                                               Social Drivers of Health (SDOH) Interventions SDOH Screenings   Food Insecurity: Food Insecurity Present (04/16/2024)  Housing: High Risk (04/16/2024)  Transportation Needs: Unmet Transportation Needs (04/16/2024)  Utilities: At Risk (04/16/2024)  Depression (PHQ2-9): High Risk (02/27/2024)  Tobacco Use: High Risk (04/07/2024)    Readmission Risk Interventions     No data to display

## 2024-05-09 NOTE — Plan of Care (Signed)

## 2024-05-09 NOTE — Progress Notes (Signed)
 PROGRESS NOTE    Brady Edwards  FMW:[id number] DOB: 1966-08-07 DOA: 04/15/2024 PCP: Vicci Duwaine SQUIBB, DO  147A/[id number]-AA  LOS: 20 days   Brief hospital course:   Brady Edwards is a 58 y.o. male with medical history significant of newly ESRD recently started on HD, atrial fibrillation, anxiety and depression, morbid obesity who presented with complaints of weakness and falls.   Pt was very frustrated with his medical and mental problems, and wanted us  to make him better.  Pt complained of fatigue, lack of energy, and weakness, causing to fall several times since he was discharged from hospital on 04/13/24.  No focal neuro deficits.  Pt said he has had long standing mental health issues and needs to see psych.  Pt complained of constipation for the past 2 months that wasn't addressed during his last hospitalization.     Of note, pt was hospitalized from 04/07/24 to 04/13/24 during which he was started on dialysis, and was discharged after dialysis on 8/15 to continue outpatient dialysis TTS.   ED Course: initial vitals: afebrile, pulse 105, BP 86/65, RR 12, sating 99% on room air.  Labs consistent with ESRD.  CXR no acute finding.  EKG showed sinus tachycardia.  CT head no acute finding.  ED provider reported pt said he would be better off not living at this point but does not have a plan for suicide and is going to receive help.  Psych consult placed.  ED provider contacted oncall nephro, who plans dialysis for pt tomorrow.  Assessment & Plan:  Brady Edwards is a 58 y.o. male with medical history significant of recently diagnosed ESRD now on HD, atrial fibrillation, anxiety and depression, morbid obesity who presented with complaints of weakness and falls.    # Orthostatic Hypotension Persistent, symptomatic when going from seated to standing position. No concern for infection. No known neurological conditions that could be contributing. AM cortisol WNL though drawn at 0500.  -- Continue  to hold home Toprol  due to low BP --Continue midodrine  10 mg TID --TED/compression hose abd binder  -- Increase florinef  0.2mg  every day --Fluid intake liberalized per nephro -- Florinef  and midodrine  remain ineffective need to consider alternative agent 8/28 stared steroids, Solu-Medrol  125 mg IV daily for 3 days, PPI for GI prophylaxis and insulin  sliding scale monitor CBG for hyperglycemia 8/30 patient felt improvement with the steroids, still has orthostasis, BP drops at standing for 3 minutes.  But no problem with ambulation. Started Cortef  50 mg in the morning and 25 mg in the evening, started tapering dose over 5 days. Hopefully patient will not need more steroids and blood pressure will improve in few days. 9/2 blood pressure is improving, RN was advised to hold off midodrine  and Florinef  if patient does not needed, follow parameters 9/3 BP is fluctuating and pt was unable to walk in the morning while working with PT   # Weakness and fall  --Fatigue and weakness have been progressive, due to recent hospitalization and decline in health.  CT head wnl, no acute finding. Noted to have elevated BUN (unclear of time line regarding rise in BUN). Also noted to have significant orthostatic hypotension. Suspect this is likely cause of fall.  --PT/OT recommending SNF 8/27 acetylcholine antibodies negative and MuSK antibodies negative, No autoimmune disorder.    # ESRD on HD TTS Secondary hyperparathyroidism Calcitriol  deficiency  --recently started on dialysis --HD per nephro   # Normocytic anemia, mild  Likely anemia of chronic disease  Stable.  Iron panel within normal limits.  B12 within normal limits.  Folate within normal limits -ESA per nephrology     Latest Ref Rng & Units 05/08/2024    7:36 AM 05/05/2024    7:40 AM 05/03/2024    7:30 AM  CBC  WBC 4.0 - 10.5 K/uL 8.1  8.9  8.6   Hemoglobin 13.0 - 17.0 g/dL 8.8  8.8  9.1   Hematocrit 39.0 - 52.0 % 27.7  27.8  28.8   Platelets  150 - 400 K/uL 195  198  175     # Hx of Afib --pt not taking anticoagulation PTA.  Currently in sinus. --hold home Toprol  due to low BP   # Depression and hx of panic disorder # Passive SI --psych consulted --no need for SI precaution, per psych --cont home bupropion  and Effexor   # Vitamin D  deficiency: Continue Calcitrol    # OSA --CPAP nightly   # Obesity, Class III, BMI 40-49.9 (morbid obesity)  Calorie restricted diet and daily exercise advised to lose body weight.  Lifestyle modification discussed.   DVT prophylaxis: Heparin  SQ Code Status: Full code  Family Communication:  Level of care: Med-Surg Dispo:   The patient is from: home Anticipated d/c is to: SNF rehab, patient is currently unhomed as he was evicted recently. TOC consulted. Patient remains hospitalized given persistent severe orthostatic hypotension  Anticipated d/c date is: whenever TOC find a place for him 9/7 awaiting for group home placement.  Subjective and Interval History:  Patient was seen and examined at bedside today. No any active issues overnight.  Patient still has orthostatics but able to manage.  Patient is trying to work with physical therapy few times in a day.   Objective: Vitals:   05/08/24 1253 05/08/24 1952 05/09/24 0810 05/09/24 0920  BP: 135/86 138/85 113/65   Pulse: 82 89 97   Resp: 17 16 17    Temp: 98 F (36.7 C) 98.6 F (37 C) 97.9 F (36.6 C)   TempSrc:  Oral    SpO2: 97% 97% 96% 98%  Weight:      Height:        Intake/Output Summary (Last 24 hours) at 05/09/2024 1318 Last data filed at 05/09/2024 0900 Gross per 24 hour  Intake 240 ml  Output --  Net 240 ml    Filed Weights   05/03/24 1130 05/05/24 0721 05/08/24 1212  Weight: 120.4 kg 122 kg 121 kg    Examination:    Constitutional: In no distress.  Cardiovascular: Normal rate, regular rhythm. No lower extremity edema  Pulmonary: Non labored breathing on room air, no wheezing or rales.   Abdominal:  Soft. Non distended and non tender Musculoskeletal: Normal range of motion.     Neurological: Alert and oriented to person, place, and time. Non focal  Skin: Skin is warm and dry.     Data Reviewed: I have personally reviewed labs and imaging studies   Time spent: 35 minutes  Elvan Sor, MD [facility] If 7PM-7AM, please contact night-coverage 05/09/2024, 1:18 PM

## 2024-05-09 NOTE — Progress Notes (Signed)
 [facility]  ROUNDING NOTE   Subjective:   Patient sitting at bedside States he had dizziness yesterday with ambulation    Objective:  Vital signs in last 24 hours:  Temp:  [97.9 F (36.6 C)-98.6 F (37 C)] 97.9 F (36.6 C) (09/10 0810) Pulse Rate:  [82-102] 97 (09/10 0810) Resp:  [16-19] 17 (09/10 0810) BP: (113-138)/(65-87) 113/65 (09/10 0810) SpO2:  [96 %-100 %] 98 % (09/10 0920) Weight:  [121 kg] 121 kg (09/09 1212)  Weight change:  Filed Weights   05/03/24 1130 05/05/24 0721 05/08/24 1212  Weight: 120.4 kg 122 kg 121 kg    Intake/Output: I/O last 3 completed shifts: In: -  Out: 500 [Other:500]   Intake/Output this shift:  Total I/O In: 240 [P.O.:240] Out: -   Physical Exam: General: NAD, missing teeth, large habitus  Head: Normocephalic  Eyes: Anicteric  Lungs:  Clear, room air  Heart: Regular rate  Abdomen:  Soft, distended   Extremities:  No peripheral edema.  Neurologic: Alert, awake  Skin: No lesions  Access: Rt chest permcath    Basic Metabolic Panel: Recent Labs  Lab 05/03/24 0730 05/05/24 0740 05/08/24 0736 05/08/24 0830  NA 136 135  --  137  K 4.4 4.5  --  3.8  CL 96* 99  --  101  CO2 24 26  --  24  GLUCOSE 89 104*  --  78  BUN 36* 34*  --  37*  CREATININE 10.35* 9.37*  --  10.26*  CALCIUM  9.1 8.8*  --  8.5*  MG  --   --  2.6*  --   PHOS 3.3 4.0  --  4.2    Liver Function Tests: Recent Labs  Lab 05/03/24 0730 05/05/24 0740  ALBUMIN  3.1* 3.2*   No results for input(s): LIPASE, AMYLASE in the last 168 hours. No results for input(s): AMMONIA in the last 168 hours.   CBC: Recent Labs  Lab 05/03/24 0730 05/05/24 0740 05/08/24 0736  WBC 8.6 8.9 8.1  NEUTROABS  --  4.5  --   HGB 9.1* 8.8* 8.8*  HCT 28.8* 27.8* 27.7*  MCV 97.6 98.2 97.9  PLT 175 198 195    Cardiac Enzymes: No results for input(s): CKTOTAL, CKMB, CKMBINDEX, TROPONINI in the last 168 hours.  BNP: Invalid input(s):  POCBNP  CBG: Recent Labs  Lab 05/07/24 2051 05/08/24 1254 05/08/24 1643 05/08/24 2203 05/09/24 0807  GLUCAP 83 69* 109* 137* 92    Microbiology: Results for orders placed or performed during the hospital encounter of 04/07/24  Resp panel by RT-PCR (RSV, Flu A&B, Covid) Anterior Nasal Swab     Status: None   Collection Time: 04/07/24  4:46 PM   Specimen: Anterior Nasal Swab  Result Value Ref Range Status   SARS Coronavirus 2 by RT PCR NEGATIVE NEGATIVE Final    Comment: (NOTE) SARS-CoV-2 target nucleic acids are NOT DETECTED.  The SARS-CoV-2 RNA is generally detectable in upper respiratory specimens during the acute phase of infection. The lowest concentration of SARS-CoV-2 viral copies this assay can detect is 138 copies/mL. A negative result does not preclude SARS-Cov-2 infection and should not be used as the sole basis for treatment or other patient management decisions. A negative result may occur with  improper specimen collection/handling, submission of specimen other than nasopharyngeal swab, presence of viral mutation(s) within the areas targeted by this assay, and inadequate number of viral copies(<138 copies/mL). A negative result must be combined with clinical observations, patient history,  and epidemiological information. The expected result is Negative.  Fact Sheet for Patients:  BloggerCourse.com  Fact Sheet for Healthcare Providers:  SeriousBroker.it  This test is no t yet approved or cleared by the [place]  FDA and  has been authorized for detection and/or diagnosis of SARS-CoV-2 by FDA under an Emergency Use Authorization (EUA). This EUA will remain  in effect (meaning this test can be used) for the duration of the COVID-19 declaration under Section 564(b)(1) of the Act, 21 U.S.C.section 360bbb-3(b)(1), unless the authorization is terminated  or revoked sooner.       Influenza A by PCR NEGATIVE  NEGATIVE Final   Influenza B by PCR NEGATIVE NEGATIVE Final    Comment: (NOTE) The Xpert Xpress SARS-CoV-2/FLU/RSV plus assay is intended as an aid in the diagnosis of influenza from Nasopharyngeal swab specimens and should not be used as a sole basis for treatment. Nasal washings and aspirates are unacceptable for Xpert Xpress SARS-CoV-2/FLU/RSV testing.  Fact Sheet for Patients: BloggerCourse.com  Fact Sheet for Healthcare Providers: SeriousBroker.it  This test is not yet approved or cleared by the [place]  FDA and has been authorized for detection and/or diagnosis of SARS-CoV-2 by FDA under an Emergency Use Authorization (EUA). This EUA will remain in effect (meaning this test can be used) for the duration of the COVID-19 declaration under Section 564(b)(1) of the Act, 21 U.S.C. section 360bbb-3(b)(1), unless the authorization is terminated or revoked.     Resp Syncytial Virus by PCR NEGATIVE NEGATIVE Final    Comment: (NOTE) Fact Sheet for Patients: BloggerCourse.com  Fact Sheet for Healthcare Providers: SeriousBroker.it  This test is not yet approved or cleared by the [place]  FDA and has been authorized for detection and/or diagnosis of SARS-CoV-2 by FDA under an Emergency Use Authorization (EUA). This EUA will remain in effect (meaning this test can be used) for the duration of the COVID-19 declaration under Section 564(b)(1) of the Act, 21 U.S.C. section 360bbb-3(b)(1), unless the authorization is terminated or revoked.  Performed at [facility], [address] [place], [place] [address]     Coagulation Studies: No results for input(s): LABPROT, INR in the last 72 hours.  Urinalysis: No results for input(s): COLORURINE, LABSPEC, PHURINE, GLUCOSEU, HGBUR, BILIRUBINUR, KETONESUR, PROTEINUR, UROBILINOGEN, NITRITE,  LEUKOCYTESUR in the last 72 hours.  Invalid input(s): APPERANCEUR    Imaging: No results found.   Medications:     bisacodyl   10 mg Oral QHS   buPROPion   300 mg Oral Daily   calcitRIOL   0.25 mcg Oral Daily   Chlorhexidine  Gluconate Cloth  6 each Topical Daily   epoetin  alfa-epbx (RETACRIT ) injection  4,000 Units Intravenous Q T,Th,Sat-1800   fludrocortisone   0.4 mg Oral Daily   heparin   5,000 Units Subcutaneous Q8H   heparin  sodium (porcine)  4,200 Units Intracatheter Once   insulin  aspart  0-15 Units Subcutaneous TID WC   midodrine   10 mg Oral TID WC   polyethylene glycol  17 g Oral BID   sevelamer  carbonate  1,600 mg Oral TID WC   venlafaxine  XR  150 mg Oral Q breakfast   And   venlafaxine  XR  75 mg Oral Q breakfast   acetaminophen , bisacodyl , ondansetron  (ZOFRAN ) IV, ondansetron   Assessment/ Plan:  Brady Edwards is a 58 y.o.  male  with a PMHx of hypertension, atrial fibrillation, BPH, hyperlipidemia, anxiety, constipation, morbid obesity, and end stage renal disease on hemodialysis.    [facility]- will need to resubmit for  chair at discharge.    End stage renal disease on hemodialysis        Received dialysis yesterday, UF 0.5L achieved. Next treatment scheduled for Thursday. Awaiting discharge plan.   2.  Acute metabolic acidosis.  Serum bicarbonate 16 on admission. Corrected and management continues with dialysis   3.  Anemia of chronic kidney disease. Hgb 8.8, continue retacrit  4000 units with dialysis.     4.  Secondary hyperparathyroidism. PTH 199 during recent admission. Continue Renvela  2 tabs TID.   Will continue to monitor bone minerals during this admission.   5. Severe orthostatic hypotension Workup so far has included 2D echo from 04/11/2024 which shows LVEF 65 to 70%, normal LV systolic function, mild concentric LVH, normal diastolic parameters, normal right ventricular systolic function.  A.m. cortisol level normal from 04/24/2024.   TSH normal.  Other differential includes amyloidosis.  Kidney biopsy or fat pad biopsy can be considered. Given steroids. Continue TEDs, midodrine  and abd binders. Florinef  to 0.4mg  daily for blood pressure support.      LOS: 20 Sterling Mondo 9/10/202510:59 AM

## 2024-05-10 ENCOUNTER — Encounter
Admission: EM | Disposition: A | Discharge status: Skilled Nursing Facility | Payer: Self-pay | Source: Home / Self Care | Attending: Student

## 2024-05-10 ENCOUNTER — Inpatient Hospital Stay: Admitting: Anesthesiology

## 2024-05-10 ENCOUNTER — Encounter: Payer: Self-pay | Admitting: Vascular Surgery

## 2024-05-10 DIAGNOSIS — Z992 Dependence on renal dialysis: Secondary | ICD-10-CM

## 2024-05-10 DIAGNOSIS — T8249XA Other complication of vascular dialysis catheter, initial encounter: Secondary | ICD-10-CM

## 2024-05-10 DIAGNOSIS — N186 End stage renal disease: Secondary | ICD-10-CM

## 2024-05-10 DIAGNOSIS — Z9889 Other specified postprocedural states: Secondary | ICD-10-CM

## 2024-05-10 HISTORY — DX: Other complication of vascular dialysis catheter, initial encounter: T82.49XA

## 2024-05-10 HISTORY — PX: DIALYSIS/PERMA CATHETER REPAIR: CATH118293

## 2024-05-10 LAB — CBC
HCT: 28 % — ABNORMAL LOW (ref 39.0–52.0)
Hemoglobin: 8.9 g/dL — ABNORMAL LOW (ref 13.0–17.0)
MCH: 31.3 pg (ref 26.0–34.0)
MCHC: 31.8 g/dL (ref 30.0–36.0)
MCV: 98.6 fL (ref 80.0–100.0)
Platelets: 197 K/uL (ref 150–400)
RBC: 2.84 MIL/uL — ABNORMAL LOW (ref 4.22–5.81)
RDW: 14.3 % (ref 11.5–15.5)
WBC: 7.1 K/uL (ref 4.0–10.5)
nRBC: 0 % (ref 0.0–0.2)

## 2024-05-10 LAB — GLUCOSE, CAPILLARY
Glucose-Capillary: 88 mg/dL (ref 70–99)
Glucose-Capillary: 77 mg/dL (ref 70–99)
Glucose-Capillary: 135 mg/dL — ABNORMAL HIGH (ref 70–99)
Glucose-Capillary: 102 mg/dL — ABNORMAL HIGH (ref 70–99)

## 2024-05-10 LAB — BASIC METABOLIC PANEL WITH GFR
Anion gap: 12 (ref 5–15)
BUN: 27 mg/dL — ABNORMAL HIGH (ref 6–20)
CO2: 27 mmol/L (ref 22–32)
Calcium: 9.1 mg/dL (ref 8.9–10.3)
Chloride: 98 mmol/L (ref 98–111)
Creatinine, Ser: 9.09 mg/dL — ABNORMAL HIGH (ref 0.61–1.24)
GFR, Estimated: 6 mL/min — ABNORMAL LOW (ref >60)
Glucose, Bld: 90 mg/dL (ref 70–99)
Potassium: 4.3 mmol/L (ref 3.5–5.1)
Sodium: 137 mmol/L (ref 135–145)

## 2024-05-10 LAB — PHOSPHORUS: Phosphorus: 4.8 mg/dL — ABNORMAL HIGH (ref 2.5–4.6)

## 2024-05-10 LAB — MAGNESIUM: Magnesium: 2.3 mg/dL (ref 1.7–2.4)

## 2024-05-10 SURGERY — DIALYSIS/PERMA CATHETER REPAIR
Anesthesia: Moderate Sedation | Laterality: Right

## 2024-05-10 MED ORDER — MIDAZOLAM HCL 2 MG/2ML IJ SOLN
INTRAMUSCULAR | Status: AC
  Filled 2024-05-10: qty 2

## 2024-05-10 MED ORDER — HEPARIN (PORCINE) IN NACL 1000-0.9 UT/500ML-% IV SOLN
INTRAVENOUS | Status: DC | PRN
  Administered 2024-05-10: 500 mL

## 2024-05-10 MED ORDER — LIDOCAINE-EPINEPHRINE (PF) 1 %-1:200000 IJ SOLN
INTRAMUSCULAR | Status: DC | PRN
  Administered 2024-05-10: 10 mL

## 2024-05-10 MED ORDER — DIPHENHYDRAMINE HCL 50 MG/ML IJ SOLN: 50.0000 mg | Freq: Once | INTRAMUSCULAR | Status: DC | PRN

## 2024-05-10 MED ORDER — EPOETIN ALFA-EPBX 4000 UNIT/ML IJ SOLN
INTRAMUSCULAR | Status: AC
  Filled 2024-05-10: qty 1

## 2024-05-10 MED ORDER — MIDAZOLAM HCL 2 MG/ML PO SYRP: 8.0000 mg | ORAL_SOLUTION | Freq: Once | ORAL | Status: DC | PRN

## 2024-05-10 MED ORDER — HEPARIN SODIUM (PORCINE) 1000 UNIT/ML DIALYSIS: 1000.0000 [IU] | INTRAMUSCULAR | Status: DC | PRN

## 2024-05-10 MED ORDER — ALTEPLASE 2 MG IJ SOLR: 2.0000 mg | Freq: Once | INTRAMUSCULAR | Status: DC | PRN

## 2024-05-10 MED ORDER — FAMOTIDINE 20 MG PO TABS: 40.0000 mg | ORAL_TABLET | Freq: Once | ORAL | Status: DC | PRN

## 2024-05-10 MED ORDER — CEFAZOLIN SODIUM-DEXTROSE 1-4 GM/50ML-% IV SOLN
INTRAVENOUS | Status: AC
  Filled 2024-05-10: qty 50

## 2024-05-10 MED ORDER — SODIUM CHLORIDE 0.9 % IV SOLN: INTRAVENOUS | Status: DC

## 2024-05-10 MED ORDER — CEFAZOLIN SODIUM-DEXTROSE 1-4 GM/50ML-% IV SOLN: 1.0000 g | INTRAVENOUS | Status: DC

## 2024-05-10 MED ORDER — MIDAZOLAM HCL 2 MG/2ML IJ SOLN
INTRAMUSCULAR | Status: DC | PRN
  Administered 2024-05-10: .5 mg via INTRAVENOUS
  Administered 2024-05-10: 2 mg via INTRAVENOUS

## 2024-05-10 MED ORDER — MIDODRINE HCL 5 MG PO TABS
ORAL_TABLET | ORAL | Status: AC
  Filled 2024-05-10: qty 2

## 2024-05-10 MED ORDER — ALTEPLASE 2 MG IJ SOLR
INTRAMUSCULAR | Status: AC
  Filled 2024-05-10: qty 4

## 2024-05-10 MED ORDER — METHYLPREDNISOLONE SODIUM SUCC 125 MG IJ SOLR: 125.0000 mg | Freq: Once | INTRAMUSCULAR | Status: DC | PRN

## 2024-05-10 MED ORDER — HEPARIN SODIUM (PORCINE) 10000 UNIT/ML IJ SOLN
INTRAMUSCULAR | Status: DC | PRN
  Administered 2024-05-10: 10000 [IU]

## 2024-05-10 MED ORDER — STERILE WATER FOR INJECTION IJ SOLN
INTRAMUSCULAR | Status: AC
  Filled 2024-05-10: qty 10

## 2024-05-10 MED ORDER — FENTANYL CITRATE (PF) 100 MCG/2ML IJ SOLN
INTRAMUSCULAR | Status: AC
  Filled 2024-05-10: qty 2

## 2024-05-10 MED ORDER — ALTEPLASE 2 MG IJ SOLR
2.0000 mg | Freq: Once | INTRAMUSCULAR | Status: AC | PRN
  Administered 2024-05-10: 2 mg

## 2024-05-10 MED ORDER — FENTANYL CITRATE (PF) 100 MCG/2ML IJ SOLN
INTRAMUSCULAR | Status: DC | PRN
  Administered 2024-05-10: 25 ug via INTRAVENOUS
  Administered 2024-05-10: 50 ug via INTRAVENOUS

## 2024-05-10 SURGICAL SUPPLY — 4 items
CATH PALINDROME-P 19CM W/VT (CATHETERS) IMPLANT
GUIDEWIRE SUPER STIFF .035X180 (WIRE) IMPLANT
SUT MNCRL AB 4-0 PS2 18 (SUTURE) IMPLANT
SUT PROLENE 0 CT 1 30 (SUTURE) IMPLANT

## 2024-05-10 NOTE — Progress Notes (Signed)
 [facility]  ROUNDING NOTE   Subjective:   Patient seen and evaluated during dialysis   HEMODIALYSIS FLOWSHEET:  Blood Flow Rate (mL/min): 0 mL/min Arterial Pressure (mmHg): 65.45 mmHg Venous Pressure (mmHg): 10.7 mmHg TMP (mmHg): -1.82 mmHg Ultrafiltration Rate (mL/min): 459 mL/min Dialysate Flow Rate (mL/min): 300 ml/min Dialysis Fluid Bolus: Albumin  Bolus Amount (mL): 100 mL  Permcath not functioning well, HD RN to cathflo  Objective:  Vital signs in last 24 hours:  Temp:  [97.7 F (36.5 C)-98.7 F (37.1 C)] 98 F (36.7 C) (09/11 0734) Pulse Rate:  [64-91] 91 (09/11 0900) Resp:  [15-20] 20 (09/11 0900) BP: (116-138)/(64-83) 116/78 (09/11 0900) SpO2:  [95 %-100 %] 99 % (09/11 0900) Weight:  [120.2 kg] 120.2 kg (09/11 0734)  Weight change:  Filed Weights   05/05/24 0721 05/08/24 1212 05/10/24 0734  Weight: 122 kg 121 kg 120.2 kg    Intake/Output: I/O last 3 completed shifts: In: 480 [P.O.:480] Out: -    Intake/Output this shift:  No intake/output data recorded.  Physical Exam: General: NAD, missing teeth, large habitus  Head: Normocephalic  Eyes: Anicteric  Lungs:  Clear, room air  Heart: Regular rate  Abdomen:  Soft, distended   Extremities:  No peripheral edema.  Neurologic: Alert, awake  Skin: No lesions  Access: Rt chest permcath    Basic Metabolic Panel: Recent Labs  Lab 05/05/24 0740 05/08/24 0736 05/08/24 0830 05/10/24 0758  NA 135  --  137 137  K 4.5  --  3.8 4.3  CL 99  --  101 98  CO2 26  --  24 27  GLUCOSE 104*  --  78 90  BUN 34*  --  37* 27*  CREATININE 9.37*  --  10.26* 9.09*  CALCIUM  8.8*  --  8.5* 9.1  MG  --  2.6*  --  2.3  PHOS 4.0  --  4.2 4.8*    Liver Function Tests: Recent Labs  Lab 05/05/24 0740  ALBUMIN  3.2*   No results for input(s): LIPASE, AMYLASE in the last 168 hours. No results for input(s): AMMONIA in the last 168 hours.   CBC: Recent Labs  Lab 05/05/24 0740 05/08/24 0736  05/10/24 0758  WBC 8.9 8.1 7.1  NEUTROABS 4.5  --   --   HGB 8.8* 8.8* 8.9*  HCT 27.8* 27.7* 28.0*  MCV 98.2 97.9 98.6  PLT 198 195 197    Cardiac Enzymes: No results for input(s): CKTOTAL, CKMB, CKMBINDEX, TROPONINI in the last 168 hours.  BNP: Invalid input(s): POCBNP  CBG: Recent Labs  Lab 05/08/24 2203 05/09/24 0807 05/09/24 1134 05/09/24 1657 05/09/24 1940  GLUCAP 137* 92 95 94 102*    Microbiology: Results for orders placed or performed during the hospital encounter of 04/07/24  Resp panel by RT-PCR (RSV, Flu A&B, Covid) Anterior Nasal Swab     Status: None   Collection Time: 04/07/24  4:46 PM   Specimen: Anterior Nasal Swab  Result Value Ref Range Status   SARS Coronavirus 2 by RT PCR NEGATIVE NEGATIVE Final    Comment: (NOTE) SARS-CoV-2 target nucleic acids are NOT DETECTED.  The SARS-CoV-2 RNA is generally detectable in upper respiratory specimens during the acute phase of infection. The lowest concentration of SARS-CoV-2 viral copies this assay can detect is 138 copies/mL. A negative result does not preclude SARS-Cov-2 infection and should not be used as the sole basis for treatment or other patient management decisions. A negative result may occur with  improper  specimen collection/handling, submission of specimen other than nasopharyngeal swab, presence of viral mutation(s) within the areas targeted by this assay, and inadequate number of viral copies(<138 copies/mL). A negative result must be combined with clinical observations, patient history, and epidemiological information. The expected result is Negative.  Fact Sheet for Patients:  BloggerCourse.com  Fact Sheet for Healthcare Providers:  SeriousBroker.it  This test is no t yet approved or cleared by the [place]  FDA and  has been authorized for detection and/or diagnosis of SARS-CoV-2 by FDA under an Emergency Use Authorization  (EUA). This EUA will remain  in effect (meaning this test can be used) for the duration of the COVID-19 declaration under Section 564(b)(1) of the Act, 21 U.S.C.section 360bbb-3(b)(1), unless the authorization is terminated  or revoked sooner.       Influenza A by PCR NEGATIVE NEGATIVE Final   Influenza B by PCR NEGATIVE NEGATIVE Final    Comment: (NOTE) The Xpert Xpress SARS-CoV-2/FLU/RSV plus assay is intended as an aid in the diagnosis of influenza from Nasopharyngeal swab specimens and should not be used as a sole basis for treatment. Nasal washings and aspirates are unacceptable for Xpert Xpress SARS-CoV-2/FLU/RSV testing.  Fact Sheet for Patients: BloggerCourse.com  Fact Sheet for Healthcare Providers: SeriousBroker.it  This test is not yet approved or cleared by the [place]  FDA and has been authorized for detection and/or diagnosis of SARS-CoV-2 by FDA under an Emergency Use Authorization (EUA). This EUA will remain in effect (meaning this test can be used) for the duration of the COVID-19 declaration under Section 564(b)(1) of the Act, 21 U.S.C. section 360bbb-3(b)(1), unless the authorization is terminated or revoked.     Resp Syncytial Virus by PCR NEGATIVE NEGATIVE Final    Comment: (NOTE) Fact Sheet for Patients: BloggerCourse.com  Fact Sheet for Healthcare Providers: SeriousBroker.it  This test is not yet approved or cleared by the [place]  FDA and has been authorized for detection and/or diagnosis of SARS-CoV-2 by FDA under an Emergency Use Authorization (EUA). This EUA will remain in effect (meaning this test can be used) for the duration of the COVID-19 declaration under Section 564(b)(1) of the Act, 21 U.S.C. section 360bbb-3(b)(1), unless the authorization is terminated or revoked.  Performed at [facility], [address]  [place], [place] [address]     Coagulation Studies: No results for input(s): LABPROT, INR in the last 72 hours.  Urinalysis: No results for input(s): COLORURINE, LABSPEC, PHURINE, GLUCOSEU, HGBUR, BILIRUBINUR, KETONESUR, PROTEINUR, UROBILINOGEN, NITRITE, LEUKOCYTESUR in the last 72 hours.  Invalid input(s): APPERANCEUR    Imaging: No results found.   Medications:     bisacodyl   10 mg Oral QHS   buPROPion   300 mg Oral Daily   calcitRIOL   0.25 mcg Oral Daily   Chlorhexidine  Gluconate Cloth  6 each Topical Daily   epoetin  alfa-epbx (RETACRIT ) injection  4,000 Units Intravenous Q T,Th,Sat-1800   fludrocortisone   0.4 mg Oral Daily   heparin   5,000 Units Subcutaneous Q8H   heparin  sodium (porcine)  4,200 Units Intracatheter Once   insulin  aspart  0-15 Units Subcutaneous TID WC   midodrine   10 mg Oral TID WC   polyethylene glycol  17 g Oral BID   sevelamer  carbonate  1,600 mg Oral TID WC   venlafaxine  XR  150 mg Oral Q breakfast   And   venlafaxine  XR  75 mg Oral Q breakfast   acetaminophen , alteplase , bisacodyl , heparin , heparin , ondansetron  (ZOFRAN ) IV, ondansetron   Assessment/ Plan:  Mr. Brady  A Edwards is a 58 y.o.  male  with a PMHx of hypertension, atrial fibrillation, BPH, hyperlipidemia, anxiety, constipation, morbid obesity, and end stage renal disease on hemodialysis.    [facility]- will need to resubmit for chair at discharge.    End stage renal disease on hemodialysis        Scheduled to receive dialysis today. Permcath not functioning well, HD RN cath flo'd access. Arterial port remains not functioning after cath flo dwell. Notified vascular surgery for permcath exchange. Will reattempt dialysis later today or tomorrow pending permcath exchange.     2.  Acute metabolic acidosis.  Serum bicarbonate 16 on admission. Corrected with dialysis   3.  Anemia of chronic kidney disease. Hgb remains 8.9, continue retacrit  4000 units with  dialysis.     4.  Secondary hyperparathyroidism. PTH 199 during recent admission. Continue Renvela  2 tabs TID.   Will continue to monitor bone minerals.   5. Severe orthostatic hypotension Workup so far has included 2D echo from 04/11/2024 which shows LVEF 65 to 70%, normal LV systolic function, mild concentric LVH, normal diastolic parameters, normal right ventricular systolic function.  A.m. cortisol level normal from 04/24/2024.  TSH normal.  Other differential includes amyloidosis.  Kidney biopsy or fat pad biopsy can be considered. Given steroids. Continue TEDs, midodrine  and abd binders. Florinef  to 0.4mg  daily for blood pressure support.      LOS: 21 Brady Edwards 9/11/20259:28 AM

## 2024-05-10 NOTE — H&P (View-Only) (Signed)
 Progress Note    05/10/2024 10:28 AM * No surgery found *  Subjective:  Brady Edwards is a 58 yo male who had a dialysis acces catheter placed on 04/12/24 by Dr Selinda Gu Md.   PROCEDURE: Ultrasound guidance for vascular access to the right internal jugular vein Fluoroscopic guidance for placement of catheter Placement of a 19 cm tip to cuff tunneled hemodialysis catheter via the right internal jugular vein  Patient resting comfortably in bed this morning. Catheter in his riight chest. No complications. No complaints. Vitals all remain stable.    Vitals:   05/10/24 0945 05/10/24 0954  BP: 112/74 135/76  Pulse: 80 66  Resp: 16 18  Temp:  98 F (36.7 C)  SpO2: 99% 100%   Physical Exam: Cardiac:  Irregular Rate and Rhythm history of atrial fibrillation.  Lungs:  Clear on auscultation with noted rales 1/3 up bilaterally.  Incisions:  Right groin with dressing in place. No complications  Extremities:  All extremities are with palpable pulses and warm to touch.  Abdomen:  Positive bowel sounds throughout, soft, non tender and non distended  Neurologic: AAOX3, answers all questions and follows commands appropriately.  CBC    Component Value Date/Time   WBC 7.1 05/10/2024 0758   RBC 2.84 (L) 05/10/2024 0758   HGB 8.9 (L) 05/10/2024 0758   HGB 16.3 02/12/2021 1604   HCT 28.0 (L) 05/10/2024 0758   HCT 47.9 02/12/2021 1604   PLT 197 05/10/2024 0758   PLT 252 02/12/2021 1604   MCV 98.6 05/10/2024 0758   MCV 92 02/12/2021 1604   MCH 31.3 05/10/2024 0758   MCHC 31.8 05/10/2024 0758   RDW 14.3 05/10/2024 0758   RDW 13.2 02/12/2021 1604   LYMPHSABS 3.0 05/05/2024 0740   LYMPHSABS 2.7 02/12/2021 1604   MONOABS 1.0 05/05/2024 0740   EOSABS 0.2 05/05/2024 0740   EOSABS 0.3 02/12/2021 1604   BASOSABS 0.0 05/05/2024 0740   BASOSABS 0.1 02/12/2021 1604    BMET    Component Value Date/Time   NA 137 05/10/2024 0758   NA 141 11/23/2022 1046   K 4.3 05/10/2024 0758   CL 98  05/10/2024 0758   CO2 27 05/10/2024 0758   GLUCOSE 90 05/10/2024 0758   BUN 27 (H) 05/10/2024 0758   BUN 33 (H) 11/23/2022 1046   CREATININE 9.09 (H) 05/10/2024 0758   CALCIUM  9.1 05/10/2024 0758   GFRNONAA 6 (L) 05/10/2024 0758   GFRAA 74 08/03/2019 0813    INR    Component Value Date/Time   INR 1.1 04/15/2024 1529     Intake/Output Summary (Last 24 hours) at 05/10/2024 1028 Last data filed at 05/09/2024 1900 Gross per 24 hour  Intake 240 ml  Output --  Net 240 ml     Assessment/Plan:  58 y.o. male is s/p SEE ABOVE  * No surgery found *   PLAN Vascular surgery was called by nephrology about the patient having difficulties with the catheter during hemodialysis.  This has occurred over the last week.  Today they could no longer complete any dialysis due to the catheter failure or malfunction.  Therefore vascular surgery will take the patient to the vascular lab for dialysis permacatheter exchange today on 05/11/2019  I discussed in detail with the patient the procedure, benefits, risk, complications.  Patient verbalizes understanding wishes to proceed to soon as possible.  Answered all his questions today.  Patient endorses he has been n.p.o. since midnight last night.  Patient's heparin  5000  units subcu every 8 hours was held this morning.   DVT prophylaxis: None today  I discussed the case in detail with Dr. Selinda Gu MD and he agrees with the plan.   [name] 05/10/2024 10:28 AM

## 2024-05-10 NOTE — Progress Notes (Addendum)
 Hemodialysis Note:  Received patient in bed to unit. Alert and oriented. Informed consent singed and in chart.  Treatment initiated: 0745 Treatment completed: Tx not completed  Access used: Right internal jugular catheter Access issues: Dialysis catheter not functioning well. Cathflo instilled for an hour but still not functioning. HD NP made aware  Transported back to room, alert without acute distress. Report given to patient's RN.  Medications given: Midodrine  10 mg tablet, Retacrit  4000 units IV  Post HD weight: 120.2 Kg  [name]

## 2024-05-10 NOTE — Progress Notes (Signed)
 PROGRESS NOTE    Brady Edwards  FMW:[id number] DOB: 22-Feb-1966 DOA: 04/15/2024 PCP: Vicci Duwaine SQUIBB, DO  [id number]/[id number]-AA  LOS: 21 days   Brief hospital course:   Brady Edwards is a 58 y.o. male with medical history significant of newly ESRD recently started on HD, atrial fibrillation, anxiety and depression, morbid obesity who presented with complaints of weakness and falls.   Pt was very frustrated with his medical and mental problems, and wanted us  to make him better.  Pt complained of fatigue, lack of energy, and weakness, causing to fall several times since he was discharged from hospital on 04/13/24.  No focal neuro deficits.  Pt said he has had long standing mental health issues and needs to see psych.  Pt complained of constipation for the past 2 months that wasn't addressed during his last hospitalization.     Of note, pt was hospitalized from 04/07/24 to 04/13/24 during which he was started on dialysis, and was discharged after dialysis on 8/15 to continue outpatient dialysis TTS.   ED Course: initial vitals: afebrile, pulse 105, BP 86/65, RR 12, sating 99% on room air.  Labs consistent with ESRD.  CXR no acute finding.  EKG showed sinus tachycardia.  CT head no acute finding.  ED provider reported pt said he would be better off not living at this point but does not have a plan for suicide and is going to receive help.  Psych consult placed.  ED provider contacted oncall nephro, who plans dialysis for pt tomorrow.  Assessment & Plan:  Brady Edwards is a 58 y.o. male with medical history significant of recently diagnosed ESRD now on HD, atrial fibrillation, anxiety and depression, morbid obesity who presented with complaints of weakness and falls.    # Orthostatic Hypotension Persistent, symptomatic when going from seated to standing position. No concern for infection. No known neurological conditions that could be contributing. AM cortisol WNL though drawn at 0500.  -- Continue  to hold home Toprol  due to low BP --Continue midodrine  10 mg TID --TED/compression hose abd binder  -- Increase florinef  0.2mg  every day --Fluid intake liberalized per nephro -- Florinef  and midodrine  remain ineffective need to consider alternative agent 8/28 stared steroids, Solu-Medrol  125 mg IV daily for 3 days, PPI for GI prophylaxis and insulin  sliding scale monitor CBG for hyperglycemia 8/30 patient felt improvement with the steroids, still has orthostasis, BP drops at standing for 3 minutes.  But no problem with ambulation. Started Cortef  50 mg in the morning and 25 mg in the evening, started tapering dose over 5 days. Hopefully patient will not need more steroids and blood pressure will improve in few days. 9/2 blood pressure is improving, RN was advised to hold off midodrine  and Florinef  if patient does not needed, follow parameters 9/3 BP is fluctuating and pt was unable to walk in the morning while working with PT   # Weakness and fall  --Fatigue and weakness have been progressive, due to recent hospitalization and decline in health.  CT head wnl, no acute finding. Noted to have elevated BUN (unclear of time line regarding rise in BUN). Also noted to have significant orthostatic hypotension. Suspect this is likely cause of fall.  --PT/OT recommending SNF 8/27 acetylcholine antibodies negative and MuSK antibodies negative, No autoimmune disorder.    # ESRD on HD TTS Secondary hyperparathyroidism Calcitriol  deficiency  --recently started on dialysis --HD per nephro   # Normocytic anemia, mild  Likely anemia of chronic disease  Stable.  Iron panel within normal limits.  B12 within normal limits.  Folate within normal limits -ESA per nephrology     Latest Ref Rng & Units 05/10/2024    7:58 AM 05/08/2024    7:36 AM 05/05/2024    7:40 AM  CBC  WBC 4.0 - 10.5 K/uL 7.1  8.1  8.9   Hemoglobin 13.0 - 17.0 g/dL 8.9  8.8  8.8   Hematocrit 39.0 - 52.0 % 28.0  27.7  27.8   Platelets  150 - 400 K/uL 197  195  198     # Hx of Afib --pt not taking anticoagulation PTA.  Currently in sinus. --hold home Toprol  due to low BP   # Depression and hx of panic disorder # Passive SI --psych consulted --no need for SI precaution, per psych --cont home bupropion  and Effexor   # Vitamin D  deficiency: Continue Calcitrol    # OSA --CPAP nightly   # Obesity, Class III, BMI 40-49.9 (morbid obesity)  Calorie restricted diet and daily exercise advised to lose body weight.  Lifestyle modification discussed.   Consults: Nephrology Vascular surgery  Procedures: 9/11 permacath exchange by vascular surgery   DVT prophylaxis: Heparin  SQ Code Status: Full code  Family Communication:  Level of care: Med-Surg Dispo:   The patient is from: home Anticipated d/c is to: SNF rehab, patient is currently unhomed as he was evicted recently. TOC consulted. Patient remains hospitalized given persistent severe orthostatic hypotension  Anticipated d/c date is: whenever TOC find a place for him 9/11 awaiting for group home placement.  Subjective and Interval History:  Patient was seen and examined at bedside today. No any active issues overnight.  As per patient he could not get hemodialysis done today because his dialysis catheter is not working, which needs to be exchanged. Vascular surgery was consulted to exchange HD permacath.   Objective: Vitals:   05/10/24 0930 05/10/24 0945 05/10/24 0954 05/10/24 1123  BP: 119/61 112/74 135/76 129/82  Pulse: 85 80 66 64  Resp: 15 16 18    Temp:   98 F (36.7 C)   TempSrc:      SpO2: 98% 99% 100% 99%  Weight: 120.2 kg     Height:        Intake/Output Summary (Last 24 hours) at 05/10/2024 1256 Last data filed at 05/09/2024 1900 Gross per 24 hour  Intake 240 ml  Output --  Net 240 ml    Filed Weights   05/08/24 1212 05/10/24 0734 05/10/24 0930  Weight: 121 kg 120.2 kg 120.2 kg    Examination:    Constitutional: In no distress.   Cardiovascular: Normal rate, regular rhythm. No lower extremity edema  Pulmonary: Non labored breathing on room air, no wheezing or rales.   Abdominal: Soft. Non distended and non tender Musculoskeletal: Normal range of motion.     Neurological: Alert and oriented to person, place, and time. Non focal  Skin: Skin is warm and dry.     Data Reviewed: I have personally reviewed labs and imaging studies   Time spent: 35 minutes  Elvan Sor, MD [facility] If 7PM-7AM, please contact night-coverage 05/10/2024, 12:56 PM

## 2024-05-10 NOTE — Plan of Care (Signed)

## 2024-05-10 NOTE — Interval H&P Note (Signed)
 History and Physical Interval Note:  05/10/2024 1:33 PM  Brady Edwards  has presented today for surgery, with the diagnosis of ESRD.  The various methods of treatment have been discussed with the patient and family. After consideration of risks, benefits and other options for treatment, the patient has consented to  Procedure(s): DIALYSIS/PERMA CATHETER REPAIR (Right) as a surgical intervention.  The patient's history has been reviewed, patient examined, no change in status, stable for surgery.  I have reviewed the patient's chart and labs.  Questions were answered to the patient's satisfaction.     Emalina Dubreuil

## 2024-05-10 NOTE — Op Note (Signed)
 OPERATIVE NOTE    PRE-OPERATIVE DIAGNOSIS: 1. ESRD 2. Non-functional permcath  POST-OPERATIVE DIAGNOSIS: same as above  PROCEDURE: Fluoroscopic guidance for placement of catheter Placement of a 19 cm tip to cuff tunneled hemodialysis catheter via the right internal jugular vein and removal of previous catheter  SURGEON: Selinda Gu, MD  ANESTHESIA:  Local with moderate conscious sedation for 16 minutes using 2.5 mg of Versed  and 75 mcg of Fentanyl   ESTIMATED BLOOD LOSS: 3 cc  FINDING(S): none  SPECIMEN(S):  None  INDICATIONS:   Patient is a 58 y.o.male who presents with non-functional dialysis catheter and ESRD.  The patient needs long term dialysis access for their ESRD, and a Permcath is necessary.  Risks and benefits are discussed and informed consent is obtained.    DESCRIPTION: After obtaining full informed written consent, the patient was brought back to the vascular suite. The patient received moderate conscious sedation during a face-to-face encounter with me present throughout the entire procedure and supervising the RN monitoring the vital signs, pulse oximetry, telemetry, and mental status throughout the entire procedure. The patient's existing catheter, right neck and chest were sterilely prepped and draped in a sterile surgical field was created.  The existing catheter was dissected free from the fibrous sheath securing the cuff with hemostats and blunt dissection.  A wire was placed. The existing catheter was then removed and the wire used to keep venous access. I selected a 19 cm tip to cuff tunneled dialysis catheter.  Using fluoroscopic guidance the catheter tips were parked in the right atrium. The appropriate distal connectors were placed. It withdrew blood well and flushed easily with heparinized saline and a concentrated heparin  solution was then placed. It was secured to the chest wall with 2 Prolene sutures. A 4-0 Monocryl pursestring suture was placed around the exit  site. Sterile dressings were placed. The patient tolerated the procedure well and was taken to the recovery room in stable condition.  COMPLICATIONS: None  CONDITION: Stable  Selinda Gu 05/10/2024 2:30 PM   This note was created with Dragon Medical transcription system. Any errors in dictation are purely unintentional.

## 2024-05-10 NOTE — Progress Notes (Signed)
 Progress Note    05/10/2024 10:28 AM * No surgery found *  Subjective:  Brady Edwards is a 58 yo male who had a dialysis acces catheter placed on 04/12/24 by Dr Selinda Gu Md.   PROCEDURE: Ultrasound guidance for vascular access to the right internal jugular vein Fluoroscopic guidance for placement of catheter Placement of a 19 cm tip to cuff tunneled hemodialysis catheter via the right internal jugular vein  Patient resting comfortably in bed this morning. Catheter in his riight chest. No complications. No complaints. Vitals all remain stable.    Vitals:   05/10/24 0945 05/10/24 0954  BP: 112/74 135/76  Pulse: 80 66  Resp: 16 18  Temp:  98 F (36.7 C)  SpO2: 99% 100%   Physical Exam: Cardiac:  Irregular Rate and Rhythm history of atrial fibrillation.  Lungs:  Clear on auscultation with noted rales 1/3 up bilaterally.  Incisions:  Right groin with dressing in place. No complications  Extremities:  All extremities are with palpable pulses and warm to touch.  Abdomen:  Positive bowel sounds throughout, soft, non tender and non distended  Neurologic: AAOX3, answers all questions and follows commands appropriately.  CBC    Component Value Date/Time   WBC 7.1 05/10/2024 0758   RBC 2.84 (L) 05/10/2024 0758   HGB 8.9 (L) 05/10/2024 0758   HGB 16.3 02/12/2021 1604   HCT 28.0 (L) 05/10/2024 0758   HCT 47.9 02/12/2021 1604   PLT 197 05/10/2024 0758   PLT 252 02/12/2021 1604   MCV 98.6 05/10/2024 0758   MCV 92 02/12/2021 1604   MCH 31.3 05/10/2024 0758   MCHC 31.8 05/10/2024 0758   RDW 14.3 05/10/2024 0758   RDW 13.2 02/12/2021 1604   LYMPHSABS 3.0 05/05/2024 0740   LYMPHSABS 2.7 02/12/2021 1604   MONOABS 1.0 05/05/2024 0740   EOSABS 0.2 05/05/2024 0740   EOSABS 0.3 02/12/2021 1604   BASOSABS 0.0 05/05/2024 0740   BASOSABS 0.1 02/12/2021 1604    BMET    Component Value Date/Time   NA 137 05/10/2024 0758   NA 141 11/23/2022 1046   K 4.3 05/10/2024 0758   CL 98  05/10/2024 0758   CO2 27 05/10/2024 0758   GLUCOSE 90 05/10/2024 0758   BUN 27 (H) 05/10/2024 0758   BUN 33 (H) 11/23/2022 1046   CREATININE 9.09 (H) 05/10/2024 0758   CALCIUM  9.1 05/10/2024 0758   GFRNONAA 6 (L) 05/10/2024 0758   GFRAA 74 08/03/2019 0813    INR    Component Value Date/Time   INR 1.1 04/15/2024 1529     Intake/Output Summary (Last 24 hours) at 05/10/2024 1028 Last data filed at 05/09/2024 1900 Gross per 24 hour  Intake 240 ml  Output --  Net 240 ml     Assessment/Plan:  58 y.o. male is s/p SEE ABOVE  * No surgery found *   PLAN Vascular surgery was called by nephrology about the patient having difficulties with the catheter during hemodialysis.  This has occurred over the last week.  Today they could no longer complete any dialysis due to the catheter failure or malfunction.  Therefore vascular surgery will take the patient to the vascular lab for dialysis permacatheter exchange today on 05/11/2019  I discussed in detail with the patient the procedure, benefits, risk, complications.  Patient verbalizes understanding wishes to proceed to soon as possible.  Answered all his questions today.  Patient endorses he has been n.p.o. since midnight last night.  Patient's heparin  5000  units subcu every 8 hours was held this morning.   DVT prophylaxis: None today  I discussed the case in detail with Dr. Selinda Gu MD and he agrees with the plan.   [name] 05/10/2024 10:28 AM

## 2024-05-11 DIAGNOSIS — R531 Weakness: Secondary | ICD-10-CM | POA: Diagnosis not present

## 2024-05-11 LAB — GLUCOSE, CAPILLARY
Glucose-Capillary: 84 mg/dL (ref 70–99)
Glucose-Capillary: 120 mg/dL — ABNORMAL HIGH (ref 70–99)
Glucose-Capillary: 76 mg/dL (ref 70–99)
Glucose-Capillary: 91 mg/dL (ref 70–99)

## 2024-05-11 NOTE — Plan of Care (Signed)

## 2024-05-11 NOTE — Progress Notes (Signed)
 [facility]  ROUNDING NOTE   Subjective:   Patient seen resting in bed Alert and oriented Room air   Objective:  Vital signs in last 24 hours:  Temp:  [97.3 F (36.3 C)-98.2 F (36.8 C)] 98.2 F (36.8 C) (09/12 0735) Pulse Rate:  [66-106] 77 (09/12 0735) Resp:  [10-23] 17 (09/12 0735) BP: (102-129)/(61-87) 129/83 (09/12 0735) SpO2:  [95 %-100 %] 99 % (09/12 0735)  Weight change:  Filed Weights   05/08/24 1212 05/10/24 0734 05/10/24 0930  Weight: 121 kg 120.2 kg 120.2 kg    Intake/Output: No intake/output data recorded.   Intake/Output this shift:  Total I/O In: 240 [P.O.:240] Out: -   Physical Exam: General: NAD, missing teeth, large habitus  Head: Normocephalic  Eyes: Anicteric  Lungs:  Clear, room air  Heart: Regular rate  Abdomen:  Soft, distended   Extremities:  No peripheral edema.  Neurologic: Alert, awake  Skin: No lesions  Access: Rt chest permcath exchanged on 9/11    Basic Metabolic Panel: Recent Labs  Lab 05/05/24 0740 05/08/24 0736 05/08/24 0830 05/10/24 0758  NA 135  --  137 137  K 4.5  --  3.8 4.3  CL 99  --  101 98  CO2 26  --  24 27  GLUCOSE 104*  --  78 90  BUN 34*  --  37* 27*  CREATININE 9.37*  --  10.26* 9.09*  CALCIUM  8.8*  --  8.5* 9.1  MG  --  2.6*  --  2.3  PHOS 4.0  --  4.2 4.8*    Liver Function Tests: Recent Labs  Lab 05/05/24 0740  ALBUMIN  3.2*   No results for input(s): LIPASE, AMYLASE in the last 168 hours. No results for input(s): AMMONIA in the last 168 hours.   CBC: Recent Labs  Lab 05/05/24 0740 05/08/24 0736 05/10/24 0758  WBC 8.9 8.1 7.1  NEUTROABS 4.5  --   --   HGB 8.8* 8.8* 8.9*  HCT 27.8* 27.7* 28.0*  MCV 98.2 97.9 98.6  PLT 198 195 197    Cardiac Enzymes: No results for input(s): CKTOTAL, CKMB, CKMBINDEX, TROPONINI in the last 168 hours.  BNP: Invalid input(s): POCBNP  CBG: Recent Labs  Lab 05/10/24 1229 05/10/24 1606 05/10/24 2108 05/11/24 0836  05/11/24 1200  GLUCAP 77 135* 102* 84 120*    Microbiology: Results for orders placed or performed during the hospital encounter of 04/07/24  Resp panel by RT-PCR (RSV, Flu A&B, Covid) Anterior Nasal Swab     Status: None   Collection Time: 04/07/24  4:46 PM   Specimen: Anterior Nasal Swab  Result Value Ref Range Status   SARS Coronavirus 2 by RT PCR NEGATIVE NEGATIVE Final    Comment: (NOTE) SARS-CoV-2 target nucleic acids are NOT DETECTED.  The SARS-CoV-2 RNA is generally detectable in upper respiratory specimens during the acute phase of infection. The lowest concentration of SARS-CoV-2 viral copies this assay can detect is 138 copies/mL. A negative result does not preclude SARS-Cov-2 infection and should not be used as the sole basis for treatment or other patient management decisions. A negative result may occur with  improper specimen collection/handling, submission of specimen other than nasopharyngeal swab, presence of viral mutation(s) within the areas targeted by this assay, and inadequate number of viral copies(<138 copies/mL). A negative result must be combined with clinical observations, patient history, and epidemiological information. The expected result is Negative.  Fact Sheet for Patients:  BloggerCourse.com  Fact Sheet for  Healthcare Providers:  SeriousBroker.it  This test is no t yet approved or cleared by the [place]  FDA and  has been authorized for detection and/or diagnosis of SARS-CoV-2 by FDA under an Emergency Use Authorization (EUA). This EUA will remain  in effect (meaning this test can be used) for the duration of the COVID-19 declaration under Section 564(b)(1) of the Act, 21 U.S.C.section 360bbb-3(b)(1), unless the authorization is terminated  or revoked sooner.       Influenza A by PCR NEGATIVE NEGATIVE Final   Influenza B by PCR NEGATIVE NEGATIVE Final    Comment: (NOTE) The Xpert  Xpress SARS-CoV-2/FLU/RSV plus assay is intended as an aid in the diagnosis of influenza from Nasopharyngeal swab specimens and should not be used as a sole basis for treatment. Nasal washings and aspirates are unacceptable for Xpert Xpress SARS-CoV-2/FLU/RSV testing.  Fact Sheet for Patients: BloggerCourse.com  Fact Sheet for Healthcare Providers: SeriousBroker.it  This test is not yet approved or cleared by the [place]  FDA and has been authorized for detection and/or diagnosis of SARS-CoV-2 by FDA under an Emergency Use Authorization (EUA). This EUA will remain in effect (meaning this test can be used) for the duration of the COVID-19 declaration under Section 564(b)(1) of the Act, 21 U.S.C. section 360bbb-3(b)(1), unless the authorization is terminated or revoked.     Resp Syncytial Virus by PCR NEGATIVE NEGATIVE Final    Comment: (NOTE) Fact Sheet for Patients: BloggerCourse.com  Fact Sheet for Healthcare Providers: SeriousBroker.it  This test is not yet approved or cleared by the [place]  FDA and has been authorized for detection and/or diagnosis of SARS-CoV-2 by FDA under an Emergency Use Authorization (EUA). This EUA will remain in effect (meaning this test can be used) for the duration of the COVID-19 declaration under Section 564(b)(1) of the Act, 21 U.S.C. section 360bbb-3(b)(1), unless the authorization is terminated or revoked.  Performed at [facility], [address] [place], [place] [address]     Coagulation Studies: No results for input(s): LABPROT, INR in the last 72 hours.  Urinalysis: No results for input(s): COLORURINE, LABSPEC, PHURINE, GLUCOSEU, HGBUR, BILIRUBINUR, KETONESUR, PROTEINUR, UROBILINOGEN, NITRITE, LEUKOCYTESUR in the last 72 hours.  Invalid input(s): APPERANCEUR    Imaging: PERIPHERAL  VASCULAR CATHETERIZATION Result Date: 05/10/2024 See surgical note for result.    Medications:     bisacodyl   10 mg Oral QHS   buPROPion   300 mg Oral Daily   calcitRIOL   0.25 mcg Oral Daily   Chlorhexidine  Gluconate Cloth  6 each Topical Daily   epoetin  alfa-epbx (RETACRIT ) injection  4,000 Units Intravenous Q T,Th,Sat-1800   fludrocortisone   0.4 mg Oral Daily   heparin   5,000 Units Subcutaneous Q8H   heparin  sodium (porcine)  4,200 Units Intracatheter Once   insulin  aspart  0-15 Units Subcutaneous TID WC   midodrine   10 mg Oral TID WC   polyethylene glycol  17 g Oral BID   sevelamer  carbonate  1,600 mg Oral TID WC   venlafaxine  XR  150 mg Oral Q breakfast   And   venlafaxine  XR  75 mg Oral Q breakfast   acetaminophen , bisacodyl , ondansetron  (ZOFRAN ) IV, ondansetron   Assessment/ Plan:  [name] is a 58 y.o.  male  with a PMHx of hypertension, atrial fibrillation, BPH, hyperlipidemia, anxiety, constipation, morbid obesity, and end stage renal disease on hemodialysis.    [facility]- will need to resubmit for chair at discharge.    End stage renal disease  on hemodialysis       Appreciate vascular performing permcath exchange on 9/11. Due to patient stability, will hold dialysis today and complete tomorrow per outpatient schedule. Patient is agreeable   2.  Acute metabolic acidosis.  Serum bicarbonate 16 on admission. Corrected with dialysis   3.  Anemia of chronic kidney disease. Will continue to monitor labs on dialysis days. Continue retacrit  4000 units with dialysis.     4.  Secondary hyperparathyroidism. PTH 199 during recent admission. Continue Renvela  2 tabs TID.   Will continue to monitor bone minerals.   5. Severe orthostatic hypotension Workup so far has included 2D echo from 04/11/2024 which shows LVEF 65 to 70%, normal LV systolic function, mild concentric LVH, normal diastolic parameters, normal right ventricular systolic function.  A.m.  cortisol level normal from 04/24/2024.  TSH normal.  Other differential includes amyloidosis.  Kidney biopsy or fat pad biopsy can be considered. Given steroids. Continue TEDs, midodrine  and abd binders. Florinef  to 0.4mg  daily for blood pressure support. Able to ambulate in room      LOS: 22 Estefany Goebel 9/12/20251:36 PM

## 2024-05-11 NOTE — Progress Notes (Signed)
 PROGRESS NOTE    Brady Edwards  FMW:[id number] DOB: June 17, 1966 DOA: 04/15/2024 PCP: Vicci Duwaine SQUIBB, DO  147A/147A-AA  LOS: 22 days   Brief hospital course:   Brady Edwards is a 58 y.o. male with medical history significant of newly ESRD recently started on HD, atrial fibrillation, anxiety and depression, morbid obesity who presented with complaints of weakness and falls.   Pt was very frustrated with his medical and mental problems, and wanted us  to make him better.  Pt complained of fatigue, lack of energy, and weakness, causing to fall several times since he was discharged from hospital on 04/13/24.  No focal neuro deficits.  Pt said he has had long standing mental health issues and needs to see psych.  Pt complained of constipation for the past 2 months that wasn't addressed during his last hospitalization.     Of note, pt was hospitalized from 04/07/24 to 04/13/24 during which he was started on dialysis, and was discharged after dialysis on 8/15 to continue outpatient dialysis TTS.   ED Course: initial vitals: afebrile, pulse 105, BP 86/65, RR 12, sating 99% on room air.  Labs consistent with ESRD.  CXR no acute finding.  EKG showed sinus tachycardia.  CT head no acute finding.  ED provider reported pt said he would be better off not living at this point but does not have a plan for suicide and is going to receive help.  Psych consult placed.  ED provider contacted oncall nephro, who plans dialysis for pt tomorrow.  Assessment & Plan:  Brady Edwards is a 58 y.o. male with medical history significant of recently diagnosed ESRD now on HD, atrial fibrillation, anxiety and depression, morbid obesity who presented with complaints of weakness and falls.    # Orthostatic Hypotension Persistent, symptomatic when going from seated to standing position. No concern for infection. No known neurological conditions that could be contributing. AM cortisol WNL though drawn at 0500.  -- Continue  to hold home Toprol  due to low BP --Continue midodrine  10 mg TID --TED/compression hose abd binder  -- Increase florinef  0.2mg  every day --Fluid intake liberalized per nephro -- Florinef  and midodrine  remain ineffective need to consider alternative agent 8/28 stared steroids, Solu-Medrol  125 mg IV daily for 3 days, PPI for GI prophylaxis and insulin  sliding scale monitor CBG for hyperglycemia 8/30 patient felt improvement with the steroids, still has orthostasis, BP drops at standing for 3 minutes.  But no problem with ambulation. Started Cortef  50 mg in the morning and 25 mg in the evening, started tapering dose over 5 days. Hopefully patient will not need more steroids and blood pressure will improve in few days. 9/2 blood pressure is improving, RN was advised to hold off midodrine  and Florinef  if patient does not needed, follow parameters 9/3 BP is fluctuating and pt was unable to walk in the morning while working with PT   # Weakness and fall  --Fatigue and weakness have been progressive, due to recent hospitalization and decline in health.  CT head wnl, no acute finding. Noted to have elevated BUN (unclear of time line regarding rise in BUN). Also noted to have significant orthostatic hypotension. Suspect this is likely cause of fall.  --PT/OT recommending SNF 8/27 acetylcholine antibodies negative and MuSK antibodies negative, No autoimmune disorder.    # ESRD on HD TTS Secondary hyperparathyroidism Calcitriol  deficiency  --recently started on dialysis --HD per nephro   # Normocytic anemia, mild  Likely anemia of chronic disease  Stable.  Iron panel within normal limits.  B12 within normal limits.  Folate within normal limits -ESA per nephrology     Latest Ref Rng & Units 05/10/2024    7:58 AM 05/08/2024    7:36 AM 05/05/2024    7:40 AM  CBC  WBC 4.0 - 10.5 K/uL 7.1  8.1  8.9   Hemoglobin 13.0 - 17.0 g/dL 8.9  8.8  8.8   Hematocrit 39.0 - 52.0 % 28.0  27.7  27.8   Platelets  150 - 400 K/uL 197  195  198     # Hx of Afib --pt not taking anticoagulation PTA.  Currently in sinus. --hold home Toprol  due to low BP   # Depression and hx of panic disorder # Passive SI --psych consulted --no need for SI precaution, per psych --cont home bupropion  and Effexor   # Vitamin D  deficiency: Continue Calcitrol    # OSA --CPAP nightly   # Obesity, Class III, BMI 40-49.9 (morbid obesity)  Calorie restricted diet and daily exercise advised to lose body weight.  Lifestyle modification discussed.   Consults: Nephrology Vascular surgery  Procedures: 9/11 permacath exchange by vascular surgery   DVT prophylaxis: Heparin  SQ Code Status: Full code  Family Communication:  Level of care: Med-Surg Dispo:   The patient is from: home Anticipated d/c is to: SNF rehab, patient is currently unhomed as he was evicted recently. TOC consulted. Patient remains hospitalized given persistent severe orthostatic hypotension  Anticipated d/c date is: whenever TOC find a place for him 9/12 awaiting for group home placement.  Subjective and Interval History:  Patient was seen and examined at bedside today. No any active issues overnight.  Orthostatic BPs improving, patient is able to ambulate without any difficulty, no dizziness. Hemodialysis catheter was replaced yesterday.  Patient could not get hemodialysis done yesterday.  Seen by nephrology, patient will get hemodialysis on Saturday.   Objective: Vitals:   05/10/24 2119 05/10/24 2123 05/11/24 0456 05/11/24 0735  BP: 102/79 108/74 104/61 129/83  Pulse:  84 66 77  Resp:   18 17  Temp:   (!) 97.4 F (36.3 C) 98.2 F (36.8 C)  TempSrc:    Oral  SpO2:  98% 100% 99%  Weight:      Height:        Intake/Output Summary (Last 24 hours) at 05/11/2024 1440 Last data filed at 05/11/2024 1434 Gross per 24 hour  Intake 360 ml  Output --  Net 360 ml    Filed Weights   05/08/24 1212 05/10/24 0734 05/10/24 0930  Weight: 121  kg 120.2 kg 120.2 kg    Examination:    Constitutional: In no distress.  Cardiovascular: Normal rate, regular rhythm. No lower extremity edema  Pulmonary: Non labored breathing on room air, no wheezing or rales.   Abdominal: Soft. Non distended and non tender Musculoskeletal: Normal range of motion.     Neurological: Alert and oriented to person, place, and time. Non focal  Skin: Skin is warm and dry.     Data Reviewed: I have personally reviewed labs and imaging studies   Time spent: 35 minutes  Elvan Sor, MD [facility] If 7PM-7AM, please contact night-coverage 05/11/2024, 2:40 PM

## 2024-05-11 NOTE — TOC Progression Note (Signed)
 Transition of Care [facility]) - Progression Note    Patient Details  Name: Brady Edwards MRN: [id number] Date of Birth: 06/29/1966  Transition of [facility]) CM/SW Contact  Alvaro Louder, [place] Phone Number: 05/11/2024, 4:49 PM  Clinical Narrative:   Rosana met with the patient and indicated that he was a good candidate for his group home. Rosana indicated that he would have to pay 3,300 upfront to pay for a month at the group home. Patient has no income at the moment. He is awaiting to hear back from disability to get funding. [facility] will continue to work on finding him placement.   TOC to follow for discharge                      Expected Discharge Plan and Services                                               Social Drivers of Health (SDOH) Interventions SDOH Screenings   Food Insecurity: Food Insecurity Present (04/16/2024)  Housing: High Risk (04/16/2024)  Transportation Needs: Unmet Transportation Needs (04/16/2024)  Utilities: At Risk (04/16/2024)  Depression (PHQ2-9): High Risk (02/27/2024)  Tobacco Use: High Risk (04/07/2024)    Readmission Risk Interventions     No data to display

## 2024-05-12 DIAGNOSIS — R531 Weakness: Secondary | ICD-10-CM | POA: Diagnosis not present

## 2024-05-12 LAB — CBC
HCT: 28.3 % — ABNORMAL LOW (ref 39.0–52.0)
Hemoglobin: 8.9 g/dL — ABNORMAL LOW (ref 13.0–17.0)
MCH: 31 pg (ref 26.0–34.0)
MCHC: 31.4 g/dL (ref 30.0–36.0)
MCV: 98.6 fL (ref 80.0–100.0)
Platelets: 231 K/uL (ref 150–400)
RBC: 2.87 MIL/uL — ABNORMAL LOW (ref 4.22–5.81)
RDW: 14.7 % (ref 11.5–15.5)
WBC: 7.8 K/uL (ref 4.0–10.5)
nRBC: 0 % (ref 0.0–0.2)

## 2024-05-12 LAB — GLUCOSE, CAPILLARY
Glucose-Capillary: 74 mg/dL (ref 70–99)
Glucose-Capillary: 95 mg/dL (ref 70–99)
Glucose-Capillary: 77 mg/dL (ref 70–99)

## 2024-05-12 LAB — MAGNESIUM: Magnesium: 2.3 mg/dL (ref 1.7–2.4)

## 2024-05-12 LAB — BASIC METABOLIC PANEL WITH GFR
Anion gap: 17 — ABNORMAL HIGH (ref 5–15)
BUN: 33 mg/dL — ABNORMAL HIGH (ref 6–20)
CO2: 22 mmol/L (ref 22–32)
Calcium: 9.2 mg/dL (ref 8.9–10.3)
Chloride: 98 mmol/L (ref 98–111)
Creatinine, Ser: 10.56 mg/dL — ABNORMAL HIGH (ref 0.61–1.24)
GFR, Estimated: 5 mL/min — ABNORMAL LOW (ref >60)
Glucose, Bld: 85 mg/dL (ref 70–99)
Potassium: 4.1 mmol/L (ref 3.5–5.1)
Sodium: 137 mmol/L (ref 135–145)

## 2024-05-12 LAB — PHOSPHORUS: Phosphorus: 5.5 mg/dL — ABNORMAL HIGH (ref 2.5–4.6)

## 2024-05-12 MED ORDER — HEPARIN SODIUM (PORCINE) 1000 UNIT/ML DIALYSIS
25.0000 [IU]/kg | INTRAMUSCULAR | Status: DC | PRN
  Administered 2024-05-12: 3000 [IU] via INTRAVENOUS_CENTRAL

## 2024-05-12 MED ORDER — HEPARIN SODIUM (PORCINE) 1000 UNIT/ML IJ SOLN
INTRAMUSCULAR | Status: AC
  Filled 2024-05-12: qty 3

## 2024-05-12 MED ORDER — EPOETIN ALFA-EPBX 4000 UNIT/ML IJ SOLN
INTRAMUSCULAR | Status: AC
  Filled 2024-05-12: qty 1

## 2024-05-12 MED ORDER — HEPARIN SODIUM (PORCINE) 1000 UNIT/ML IJ SOLN
INTRAMUSCULAR | Status: AC
  Filled 2024-05-12: qty 4

## 2024-05-12 NOTE — Progress Notes (Addendum)
 [facility]  ROUNDING NOTE   Subjective:   Patient seen and evaluated during dialysis   HEMODIALYSIS FLOWSHEET:  Blood Flow Rate (mL/min): 0 mL/min Arterial Pressure (mmHg): -84.04 mmHg Venous Pressure (mmHg): 45.86 mmHg TMP (mmHg): 16.16 mmHg Ultrafiltration Rate (mL/min): 467 mL/min Dialysate Flow Rate (mL/min): 300 ml/min Dialysis Fluid Bolus: Albumin  Bolus Amount (mL): 100 mL  Tolerating treatment well   Objective:  Vital signs in last 24 hours:  Temp:  [97.6 F (36.4 C)-98.2 F (36.8 C)] 97.9 F (36.6 C) (09/13 1110) Pulse Rate:  [70-97] 78 (09/13 1115) Resp:  [14-20] 14 (09/13 1115) BP: (92-143)/(66-95) 135/89 (09/13 1115) SpO2:  [95 %-100 %] 97 % (09/13 1115) Weight:  [120.5 kg] 120.5 kg (09/13 0743)  Weight change:  Filed Weights   05/10/24 0734 05/10/24 0930 05/12/24 0743  Weight: 120.2 kg 120.2 kg 120.5 kg    Intake/Output: I/O last 3 completed shifts: In: 360 [P.O.:360] Out: -    Intake/Output this shift:  No intake/output data recorded.  Physical Exam: General: NAD, missing teeth, large habitus  Head: Normocephalic  Eyes: Anicteric  Lungs:  Clear, room air  Heart: Regular rate  Abdomen:  Soft, distended   Extremities:  No peripheral edema.  Neurologic: Alert, awake  Skin: No lesions  Access: Rt chest permcath exchanged on 9/11    Basic Metabolic Panel: Recent Labs  Lab 05/08/24 0736 05/08/24 0830 05/10/24 0758 05/12/24 0800  NA  --  137 137 137  K  --  3.8 4.3 4.1  CL  --  101 98 98  CO2  --  24 27 22   GLUCOSE  --  78 90 85  BUN  --  37* 27* 33*  CREATININE  --  10.26* 9.09* 10.56*  CALCIUM   --  8.5* 9.1 9.2  MG 2.6*  --  2.3 2.3  PHOS  --  4.2 4.8* 5.5*    Liver Function Tests: No results for input(s): AST, ALT, ALKPHOS, BILITOT, PROT, ALBUMIN  in the last 168 hours.  No results for input(s): LIPASE, AMYLASE in the last 168 hours. No results for input(s): AMMONIA in the last 168  hours.   CBC: Recent Labs  Lab 05/08/24 0736 05/10/24 0758 05/12/24 0800  WBC 8.1 7.1 7.8  HGB 8.8* 8.9* 8.9*  HCT 27.7* 28.0* 28.3*  MCV 97.9 98.6 98.6  PLT 195 197 231    Cardiac Enzymes: No results for input(s): CKTOTAL, CKMB, CKMBINDEX, TROPONINI in the last 168 hours.  BNP: Invalid input(s): POCBNP  CBG: Recent Labs  Lab 05/11/24 0836 05/11/24 1200 05/11/24 1700 05/11/24 2109 05/12/24 1218  GLUCAP 84 120* 76 91 74    Microbiology: Results for orders placed or performed during the hospital encounter of 04/07/24  Resp panel by RT-PCR (RSV, Flu A&B, Covid) Anterior Nasal Swab     Status: None   Collection Time: 04/07/24  4:46 PM   Specimen: Anterior Nasal Swab  Result Value Ref Range Status   SARS Coronavirus 2 by RT PCR NEGATIVE NEGATIVE Final    Comment: (NOTE) SARS-CoV-2 target nucleic acids are NOT DETECTED.  The SARS-CoV-2 RNA is generally detectable in upper respiratory specimens during the acute phase of infection. The lowest concentration of SARS-CoV-2 viral copies this assay can detect is 138 copies/mL. A negative result does not preclude SARS-Cov-2 infection and should not be used as the sole basis for treatment or other patient management decisions. A negative result may occur with  improper specimen collection/handling, submission of specimen other than  nasopharyngeal swab, presence of viral mutation(s) within the areas targeted by this assay, and inadequate number of viral copies(<138 copies/mL). A negative result must be combined with clinical observations, patient history, and epidemiological information. The expected result is Negative.  Fact Sheet for Patients:  BloggerCourse.com  Fact Sheet for Healthcare Providers:  SeriousBroker.it  This test is no t yet approved or cleared by the [place]  FDA and  has been authorized for detection and/or diagnosis of SARS-CoV-2 by FDA  under an Emergency Use Authorization (EUA). This EUA will remain  in effect (meaning this test can be used) for the duration of the COVID-19 declaration under Section 564(b)(1) of the Act, 21 U.S.C.section 360bbb-3(b)(1), unless the authorization is terminated  or revoked sooner.       Influenza A by PCR NEGATIVE NEGATIVE Final   Influenza B by PCR NEGATIVE NEGATIVE Final    Comment: (NOTE) The Xpert Xpress SARS-CoV-2/FLU/RSV plus assay is intended as an aid in the diagnosis of influenza from Nasopharyngeal swab specimens and should not be used as a sole basis for treatment. Nasal washings and aspirates are unacceptable for Xpert Xpress SARS-CoV-2/FLU/RSV testing.  Fact Sheet for Patients: BloggerCourse.com  Fact Sheet for Healthcare Providers: SeriousBroker.it  This test is not yet approved or cleared by the [place]  FDA and has been authorized for detection and/or diagnosis of SARS-CoV-2 by FDA under an Emergency Use Authorization (EUA). This EUA will remain in effect (meaning this test can be used) for the duration of the COVID-19 declaration under Section 564(b)(1) of the Act, 21 U.S.C. section 360bbb-3(b)(1), unless the authorization is terminated or revoked.     Resp Syncytial Virus by PCR NEGATIVE NEGATIVE Final    Comment: (NOTE) Fact Sheet for Patients: BloggerCourse.com  Fact Sheet for Healthcare Providers: SeriousBroker.it  This test is not yet approved or cleared by the [place]  FDA and has been authorized for detection and/or diagnosis of SARS-CoV-2 by FDA under an Emergency Use Authorization (EUA). This EUA will remain in effect (meaning this test can be used) for the duration of the COVID-19 declaration under Section 564(b)(1) of the Act, 21 U.S.C. section 360bbb-3(b)(1), unless the authorization is terminated or revoked.  Performed at [facility], [address] [place], [place] [address]     Coagulation Studies: No results for input(s): LABPROT, INR in the last 72 hours.  Urinalysis: No results for input(s): COLORURINE, LABSPEC, PHURINE, GLUCOSEU, HGBUR, BILIRUBINUR, KETONESUR, PROTEINUR, UROBILINOGEN, NITRITE, LEUKOCYTESUR in the last 72 hours.  Invalid input(s): APPERANCEUR    Imaging: PERIPHERAL VASCULAR CATHETERIZATION Result Date: 05/10/2024 See surgical note for result.    Medications:     bisacodyl   10 mg Oral QHS   buPROPion   300 mg Oral Daily   calcitRIOL   0.25 mcg Oral Daily   Chlorhexidine  Gluconate Cloth  6 each Topical Daily   epoetin  alfa-epbx (RETACRIT ) injection  4,000 Units Intravenous Q T,Th,Sat-1800   fludrocortisone   0.4 mg Oral Daily   heparin   5,000 Units Subcutaneous Q8H   heparin  sodium (porcine)  4,200 Units Intracatheter Once   insulin  aspart  0-15 Units Subcutaneous TID WC   midodrine   10 mg Oral TID WC   polyethylene glycol  17 g Oral BID   sevelamer  carbonate  1,600 mg Oral TID WC   venlafaxine  XR  150 mg Oral Q breakfast   And   venlafaxine  XR  75 mg Oral Q breakfast   acetaminophen , bisacodyl , ondansetron  (ZOFRAN ) IV, ondansetron   Assessment/ Plan:  Mr. Brady Chamberlin  Edwards is a 58 y.o.  male  with a PMHx of hypertension, atrial fibrillation, BPH, hyperlipidemia, anxiety, constipation, morbid obesity, and end stage renal disease on hemodialysis.    [facility]- will need to resubmit for chair at discharge.    End stage renal disease on hemodialysis       Appreciate vascular performing permcath exchange on 9/11. Receiving dialysis today, no UF. Next treatment scheduled for Tuesday.    2.  Acute metabolic acidosis.  Serum bicarbonate 16 on admission. Managed with dialysis   3.  Anemia of chronic kidney disease. Will continue to monitor labs on dialysis days. Continue retacrit  4000 units with dialysis.     4.  Secondary  hyperparathyroidism. PTH 199 during recent admission. Continue Renvela  2 tabs TID.   Calcium  acceptable with borderline phos. WILl continue binders as above.   5. Severe orthostatic hypotension Workup so far has included 2D echo from 04/11/2024 which shows LVEF 65 to 70%, normal LV systolic function, mild concentric LVH, normal diastolic parameters, normal right ventricular systolic function.  A.m. cortisol level normal from 04/24/2024.  TSH normal.  Other differential includes amyloidosis.  Kidney biopsy or fat pad biopsy can be considered. Given steroids. Continue TEDs, midodrine  and abd binders. Florinef  to 0.4mg  daily for blood pressure support. Able to ambulate in room      LOS: 23 [place] Fant 9/13/202512:20 PM

## 2024-05-12 NOTE — Progress Notes (Signed)
 Mobility Specialist Progress Note:    05/12/24 1420  Orthostatic Lying   BP- Lying 129/85  Pulse- Lying 81  Orthostatic Sitting  BP- Sitting 106/59  Pulse- Sitting 125  Orthostatic Standing at 0 minutes  BP- Standing at 0 minutes 96/73  Pulse- Standing at 0 minutes 128  Orthostatic Standing at 3 minutes  BP- Standing at 3 minutes 116/82  Pulse- Standing at 3 minutes 124  Mobility  Activity Ambulated with assistance  Level of Assistance Standby assist, set-up cues, supervision of patient - no hands on  Assistive Device None  Distance Ambulated (ft) 200 ft  Range of Motion/Exercises Active;All extremities  Activity Response Tolerated well  Mobility visit 1 Mobility  Mobility Specialist Start Time (ACUTE ONLY) 1415  Mobility Specialist Stop Time (ACUTE ONLY) 1443  Mobility Specialist Time Calculation (min) (ACUTE ONLY) 28 min   Pt agreeable to mobility. Ted hose, ace wraps, and abdominal binder were placed by this mobility specialist before session. Required supervision to stand and ambulate with no AD. During session pt reports feeling well and determined to ambulate. HR up to 140 bpm during ambulation, recovered quickly to 116 bpm. Nurse notified. Returned to room, left supine. All needs met.  Sherrilee Ditty Mobility Specialist Please contact via Special educational needs teacher or  Rehab office at [phone]

## 2024-05-12 NOTE — Progress Notes (Signed)
 PROGRESS NOTE    Brady Edwards  FMW:[id number] DOB: 10/25/1965 DOA: 04/15/2024 PCP: Vicci Duwaine SQUIBB, DO  147A/[id number]-AA  LOS: 23 days   Brief hospital course:   Brady Edwards is a 58 y.o. male with medical history significant of newly ESRD recently started on HD, atrial fibrillation, anxiety and depression, morbid obesity who presented with complaints of weakness and falls.   Pt was very frustrated with his medical and mental problems, and wanted us  to make him better.  Pt complained of fatigue, lack of energy, and weakness, causing to fall several times since he was discharged from hospital on 04/13/24.  No focal neuro deficits.  Pt said he has had long standing mental health issues and needs to see psych.  Pt complained of constipation for the past 2 months that wasn't addressed during his last hospitalization.     Of note, pt was hospitalized from 04/07/24 to 04/13/24 during which he was started on dialysis, and was discharged after dialysis on 8/15 to continue outpatient dialysis TTS.   ED Course: initial vitals: afebrile, pulse 105, BP 86/65, RR 12, sating 99% on room air.  Labs consistent with ESRD.  CXR no acute finding.  EKG showed sinus tachycardia.  CT head no acute finding.  ED provider reported pt said he would be better off not living at this point but does not have a plan for suicide and is going to receive help.  Psych consult placed.  ED provider contacted oncall nephro, who plans dialysis for pt tomorrow.  Assessment & Plan:  Brady Edwards is a 58 y.o. male with medical history significant of recently diagnosed ESRD now on HD, atrial fibrillation, anxiety and depression, morbid obesity who presented with complaints of weakness and falls.    # Orthostatic Hypotension Persistent, symptomatic when going from seated to standing position. No concern for infection. No known neurological conditions that could be contributing. AM cortisol WNL though drawn at 0500.  -- Continue  to hold home Toprol  due to low BP --Continue midodrine  10 mg TID --TED/compression hose abd binder  -- Increase florinef  0.2mg  every day --Fluid intake liberalized per nephro -- Florinef  and midodrine  remain ineffective need to consider alternative agent 8/28 stared steroids, Solu-Medrol  125 mg IV daily for 3 days, PPI for GI prophylaxis and insulin  sliding scale monitor CBG for hyperglycemia 8/30 patient felt improvement with the steroids, still has orthostasis, BP drops at standing for 3 minutes.  But no problem with ambulation. Started Cortef  50 mg in the morning and 25 mg in the evening, started tapering dose over 5 days. Hopefully patient will not need more steroids and blood pressure will improve in few days. 9/2 blood pressure is improving, RN was advised to hold off midodrine  and Florinef  if patient does not needed, follow parameters 9/3 BP is fluctuating and pt was unable to walk in the morning while working with PT   # Weakness and fall  --Fatigue and weakness have been progressive, due to recent hospitalization and decline in health.  CT head wnl, no acute finding. Noted to have elevated BUN (unclear of time line regarding rise in BUN). Also noted to have significant orthostatic hypotension. Suspect this is likely cause of fall.  --PT/OT recommending SNF 8/27 acetylcholine antibodies negative and MuSK antibodies negative, No autoimmune disorder.    # ESRD on HD TTS Secondary hyperparathyroidism Calcitriol  deficiency  --recently started on dialysis --HD per nephro   # Normocytic anemia, mild  Likely anemia of chronic disease  Stable.  Iron panel within normal limits.  B12 within normal limits.  Folate within normal limits -ESA per nephrology     Latest Ref Rng & Units 05/12/2024    8:00 AM 05/10/2024    7:58 AM 05/08/2024    7:36 AM  CBC  WBC 4.0 - 10.5 K/uL 7.8  7.1  8.1   Hemoglobin 13.0 - 17.0 g/dL 8.9  8.9  8.8   Hematocrit 39.0 - 52.0 % 28.3  28.0  27.7   Platelets  150 - 400 K/uL 231  197  195     # Hx of Afib --pt not taking anticoagulation PTA.  Currently in sinus. --hold home Toprol  due to low BP   # Depression and hx of panic disorder # Passive SI --psych consulted --no need for SI precaution, per psych --cont home bupropion  and Effexor   # Vitamin D  deficiency: Continue Calcitrol    # OSA --CPAP nightly   # Obesity, Class III, BMI 40-49.9 (morbid obesity)  Calorie restricted diet and daily exercise advised to lose body weight.  Lifestyle modification discussed.   Consults: Nephrology Vascular surgery  Procedures: 9/11 permacath exchange by vascular surgery   DVT prophylaxis: Heparin  SQ Code Status: Full code  Family Communication:  Level of care: Med-Surg Dispo:   The patient is from: home Anticipated d/c is to: SNF rehab, patient is currently unhomed as he was evicted recently. TOC consulted. Patient remains hospitalized given persistent severe orthostatic hypotension  Anticipated d/c date is: whenever TOC find a place for him 9/13 awaiting for group home placement.  Subjective and Interval History:  Patient was seen and examined at bedside today. No any active issues overnight.  Patient was seen after hemodialysis, has not walked yet. Patient was advised to call RN to check orthostatics whenever they checked vital signs and keep ambulating with help.   Objective: Vitals:   05/12/24 1030 05/12/24 1100 05/12/24 1110 05/12/24 1115  BP: (!) 140/79 117/77 123/85 135/89  Pulse: 75 97 72 78  Resp: 18 19 15 14   Temp:   97.9 F (36.6 C)   TempSrc:   Oral   SpO2: 98% 98% 98% 97%  Weight:      Height:        Intake/Output Summary (Last 24 hours) at 05/12/2024 1245 Last data filed at 05/12/2024 1115 Gross per 24 hour  Intake 120 ml  Output 0 ml  Net 120 ml    Filed Weights   05/10/24 0734 05/10/24 0930 05/12/24 0743  Weight: 120.2 kg 120.2 kg 120.5 kg    Examination:    Constitutional: In no distress.   Cardiovascular: Normal rate, regular rhythm. No lower extremity edema  Pulmonary: Non labored breathing on room air, no wheezing or rales.   Abdominal: Soft. Non distended and non tender Musculoskeletal: Normal range of motion.     Neurological: Alert and oriented to person, place, and time. Non focal  Skin: Skin is warm and dry.     Data Reviewed: I have personally reviewed labs and imaging studies   Time spent: 35 minutes  Elvan Sor, MD [facility] If 7PM-7AM, please contact night-coverage 05/12/2024, 12:45 PM

## 2024-05-12 NOTE — Progress Notes (Signed)
 Hemodialysis Note:  Received patient in bed to unit. Alert and oriented. Informed consent singed and in chart.  Treatment initiated: 0758 Treatment completed: 1110  Access used: Right internal jugular catheter Access issues: None  Patient tolerated well. Transported back to room, alert without acute distress. Report given to patient's RN.  Total UF removed: 0 Medications given: Retacrit  4000 units IV  Post HD weight: 120.5 Kg  [name]

## 2024-05-13 DIAGNOSIS — R531 Weakness: Secondary | ICD-10-CM | POA: Diagnosis not present

## 2024-05-13 LAB — GLUCOSE, CAPILLARY
Glucose-Capillary: 81 mg/dL (ref 70–99)
Glucose-Capillary: 115 mg/dL — ABNORMAL HIGH (ref 70–99)
Glucose-Capillary: 72 mg/dL (ref 70–99)
Glucose-Capillary: 94 mg/dL (ref 70–99)

## 2024-05-13 MED ORDER — BUTALBITAL-APAP-CAFFEINE 50-325-40 MG PO TABS
1.0000 | ORAL_TABLET | Freq: Four times a day (QID) | ORAL | Status: DC | PRN
  Administered 2024-05-13: 1 via ORAL
  Filled 2024-05-13: qty 1

## 2024-05-13 MED ORDER — ACETAMINOPHEN 325 MG PO TABS: 650.0000 mg | ORAL_TABLET | Freq: Three times a day (TID) | ORAL | Status: DC | PRN

## 2024-05-13 NOTE — Progress Notes (Signed)
 PROGRESS NOTE    Brady Edwards  FMW:[id number] DOB: [birth year] DOA: 04/15/2024 PCP: Vicci Duwaine SQUIBB, DO  147A/147A-AA  LOS: 24 days   Brief hospital course:   Brady Edwards is a 58 y.o. male with medical history significant of newly ESRD recently started on HD, atrial fibrillation, anxiety and depression, morbid obesity who presented with complaints of weakness and falls.   Pt was very frustrated with his medical and mental problems, and wanted us  to make him better.  Pt complained of fatigue, lack of energy, and weakness, causing to fall several times since he was discharged from hospital on 04/13/24.  No focal neuro deficits.  Pt said he has had long standing mental health issues and needs to see psych.  Pt complained of constipation for the past 2 months that wasn't addressed during his last hospitalization.     Of note, pt was hospitalized from 04/07/24 to 04/13/24 during which he was started on dialysis, and was discharged after dialysis on 8/15 to continue outpatient dialysis TTS.   ED Course: initial vitals: afebrile, pulse 105, BP 86/65, RR 12, sating 99% on room air.  Labs consistent with ESRD.  CXR no acute finding.  EKG showed sinus tachycardia.  CT head no acute finding.  ED provider reported pt said he would be better off not living at this point but does not have a plan for suicide and is going to receive help.  Psych consult placed.  ED provider contacted oncall nephro, who plans dialysis for pt tomorrow.  Assessment & Plan:  Brady Edwards is a 58 y.o. male with medical history significant of recently diagnosed ESRD now on HD, atrial fibrillation, anxiety and depression, morbid obesity who presented with complaints of weakness and falls.    # Orthostatic Hypotension Persistent, symptomatic when going from seated to standing position. No concern for infection. No known neurological conditions that could be contributing. AM cortisol WNL though drawn at 0500.  -- Continue  to hold home Toprol  due to low BP --Continue midodrine  10 mg TID --TED/compression hose abd binder  -- Increase florinef  0.2mg  every day --Fluid intake liberalized per nephro -- Florinef  and midodrine  remain ineffective need to consider alternative agent 8/28 stared steroids, Solu-Medrol  125 mg IV daily for 3 days, PPI for GI prophylaxis and insulin  sliding scale monitor CBG for hyperglycemia 8/30 patient felt improvement with the steroids, still has orthostasis, BP drops at standing for 3 minutes.  But no problem with ambulation. Started Cortef  50 mg in the morning and 25 mg in the evening, started tapering dose over 5 days. Hopefully patient will not need more steroids and blood pressure will improve in few days. 9/2 blood pressure is improving, RN was advised to hold off midodrine  and Florinef  if patient does not needed, follow parameters 9/3 BP is fluctuating and pt was unable to walk in the morning while working with PT  9/14 still positive orthostatics, symptoms are much better now.  Patient is able to walk gradually without any risk of fall   # Weakness and fall  --Fatigue and weakness have been progressive, due to recent hospitalization and decline in health.  CT head wnl, no acute finding. Noted to have elevated BUN (unclear of time line regarding rise in BUN). Also noted to have significant orthostatic hypotension. Suspect this is likely cause of fall.  --PT/OT recommending SNF 8/27 acetylcholine antibodies negative and MuSK antibodies negative, No autoimmune disorder.    # ESRD on HD TTS Secondary hyperparathyroidism  Calcitriol  deficiency  --recently started on dialysis --HD per nephro   # Normocytic anemia, mild  Likely anemia of chronic disease  Stable.  Iron panel within normal limits.  B12 within normal limits.  Folate within normal limits -ESA per nephrology     Latest Ref Rng & Units 05/12/2024    8:00 AM 05/10/2024    7:58 AM 05/08/2024    7:36 AM  CBC  WBC 4.0 -  10.5 K/uL 7.8  7.1  8.1   Hemoglobin 13.0 - 17.0 g/dL 8.9  8.9  8.8   Hematocrit 39.0 - 52.0 % 28.3  28.0  27.7   Platelets 150 - 400 K/uL 231  197  195     # Hx of Afib --pt not taking anticoagulation PTA.  Currently in sinus. --hold home Toprol  due to low BP   # Depression and hx of panic disorder # Passive SI --psych consulted --no need for SI precaution, per psych --cont home bupropion  and Effexor   # Vitamin D  deficiency: Continue Calcitrol    # OSA --CPAP nightly   # Obesity, Class III, BMI 40-49.9 (morbid obesity)  Calorie restricted diet and daily exercise advised to lose body weight.  Lifestyle modification discussed.   Consults: Nephrology Vascular surgery  Procedures: 9/11 permacath exchange by vascular surgery   DVT prophylaxis: Heparin  SQ Code Status: Full code  Family Communication:  Level of care: Med-Surg Dispo:   The patient is from: home Anticipated d/c is to: SNF rehab, patient is currently unhomed as he was evicted recently. TOC consulted. Patient remains hospitalized given persistent severe orthostatic hypotension  Anticipated d/c date is: whenever TOC find a place for him 9/13 awaiting for group home placement.  Subjective and Interval History:  Patient was seen and examined at bedside today. No any active issues overnight.  Patient was getting orthostatic done in the morning time.  Denied any active symptoms while sitting.  Objective: Vitals:   05/13/24 0447 05/13/24 0450 05/13/24 0757 05/13/24 1104  BP: 120/82 98/63 135/83 127/80  Pulse: 93 86 88 85  Resp: 16 14 16 17   Temp: 97.7 F (36.5 C) 97.7 F (36.5 C) 98.1 F (36.7 C) 98.7 F (37.1 C)  TempSrc:   Oral Oral  SpO2: 100% 100% 100% 95%  Weight:      Height:        Intake/Output Summary (Last 24 hours) at 05/13/2024 1512 Last data filed at 05/13/2024 1300 Gross per 24 hour  Intake 480 ml  Output --  Net 480 ml    Filed Weights   05/10/24 0734 05/10/24 0930 05/12/24 0743   Weight: 120.2 kg 120.2 kg 120.5 kg    Examination:    Constitutional: In no distress.  Cardiovascular: Normal rate, regular rhythm. No lower extremity edema  Pulmonary: Non labored breathing on room air, no wheezing or rales.   Abdominal: Soft. Non distended and non tender Musculoskeletal: Normal range of motion.     Neurological: Alert and oriented to person, place, and time. Non focal  Skin: Skin is warm and dry.     Data Reviewed: I have personally reviewed labs and imaging studies   Time spent: 35 minutes  Elvan Sor, MD [facility] If 7PM-7AM, please contact night-coverage 05/13/2024, 3:12 PM

## 2024-05-13 NOTE — Progress Notes (Signed)
 Mobility Specialist Progress Note:    05/13/24 1034  Orthostatic Lying   BP- Lying 129/78  Pulse- Lying 101  Orthostatic Sitting  BP- Sitting 112/74  Pulse- Sitting 120  Orthostatic Standing at 0 minutes  BP- Standing at 0 minutes 93/55  Pulse- Standing at 0 minutes 125  Orthostatic Standing at 3 minutes  BP- Standing at 3 minutes 92/74  Pulse- Standing at 3 minutes 138  Mobility  Activity Ambulated with assistance  Level of Assistance Standby assist, set-up cues, supervision of patient - no hands on  Assistive Device None  Distance Ambulated (ft) 200 ft  Range of Motion/Exercises Active;All extremities  Activity Response Tolerated well  Mobility visit 1 Mobility  Mobility Specialist Start Time (ACUTE ONLY) 1030  Mobility Specialist Stop Time (ACUTE ONLY) 1055  Mobility Specialist Time Calculation (min) (ACUTE ONLY) 25 min   Pt has mild dizziness during standing that subsides quickly. HR up to 150 bpm during ambulation but recovers during rest breaks. Returned to room, HR 109 bpm after sitting, pt c/o fatigue. Left supine, all needs met.   Sherrilee Ditty Mobility Specialist Please contact via Special educational needs teacher or  Rehab office at [phone]

## 2024-05-14 DIAGNOSIS — R531 Weakness: Secondary | ICD-10-CM | POA: Diagnosis not present

## 2024-05-14 LAB — GLUCOSE, CAPILLARY
Glucose-Capillary: 81 mg/dL (ref 70–99)
Glucose-Capillary: 109 mg/dL — ABNORMAL HIGH (ref 70–99)
Glucose-Capillary: 116 mg/dL — ABNORMAL HIGH (ref 70–99)
Glucose-Capillary: 141 mg/dL — ABNORMAL HIGH (ref 70–99)

## 2024-05-14 NOTE — Plan of Care (Signed)
   Problem: Activity: Goal: Risk for activity intolerance will decrease Outcome: Progressing   Problem: Nutrition: Goal: Adequate nutrition will be maintained Outcome: Progressing   Problem: Coping: Goal: Level of anxiety will decrease Outcome: Progressing   Problem: Safety: Goal: Ability to remain free from injury will improve Outcome: Progressing

## 2024-05-14 NOTE — Progress Notes (Signed)
 PROGRESS NOTE    Brady Edwards  FMW:[id number] DOB: 1966-07-04 DOA: 04/15/2024 PCP: Vicci Duwaine SQUIBB, DO  147A/147A-AA  LOS: 25 days   Brief hospital course:   Brady Edwards is a 58 y.o. male with medical history significant of newly ESRD recently started on HD, atrial fibrillation, anxiety and depression, morbid obesity who presented with complaints of weakness and falls.   Pt was very frustrated with his medical and mental problems, and wanted us  to make him better.  Pt complained of fatigue, lack of energy, and weakness, causing to fall several times since he was discharged from hospital on 04/13/24.  No focal neuro deficits.  Pt said he has had long standing mental health issues and needs to see psych.  Pt complained of constipation for the past 2 months that wasn't addressed during his last hospitalization.     Of note, pt was hospitalized from 04/07/24 to 04/13/24 during which he was started on dialysis, and was discharged after dialysis on 8/15 to continue outpatient dialysis TTS.   ED Course: initial vitals: afebrile, pulse 105, BP 86/65, RR 12, sating 99% on room air.  Labs consistent with ESRD.  CXR no acute finding.  EKG showed sinus tachycardia.  CT head no acute finding.  ED provider reported pt said he would be better off not living at this point but does not have a plan for suicide and is going to receive help.  Psych consult placed.  ED provider contacted oncall nephro, who plans dialysis for pt tomorrow.  Assessment & Plan:  Brady Edwards is a 58 y.o. male with medical history significant of recently diagnosed ESRD now on HD, atrial fibrillation, anxiety and depression, morbid obesity who presented with complaints of weakness and falls.    # Orthostatic Hypotension Persistent, symptomatic when going from seated to standing position. No concern for infection. No known neurological conditions that could be contributing. AM cortisol WNL though drawn at 0500.  -- Continue  to hold home Toprol  due to low BP --Continue midodrine  10 mg TID --TED/compression hose abd binder  -- Increase florinef  0.2mg  every day --Fluid intake liberalized per nephro -- Florinef  and midodrine  remain ineffective need to consider alternative agent 8/28 stared steroids, Solu-Medrol  125 mg IV daily for 3 days, PPI for GI prophylaxis and insulin  sliding scale monitor CBG for hyperglycemia 8/30 patient felt improvement with the steroids, still has orthostasis, BP drops at standing for 3 minutes.  But no problem with ambulation. Started Cortef  50 mg in the morning and 25 mg in the evening, started tapering dose over 5 days. Hopefully patient will not need more steroids and blood pressure will improve in few days. 9/2 blood pressure is improving, RN was advised to hold off midodrine  and Florinef  if patient does not needed, follow parameters 9/3 BP is fluctuating and pt was unable to walk in the morning while working with PT  9/15 orthostatic BPs improving, still blood pressure drops but patient is not symptomatic.  Able to ambulate, without any risk of fall.   # Weakness and fall  --Fatigue and weakness have been progressive, due to recent hospitalization and decline in health.  CT head wnl, no acute finding. Noted to have elevated BUN (unclear of time line regarding rise in BUN). Also noted to have significant orthostatic hypotension. Suspect this is likely cause of fall.  --PT/OT recommending SNF 8/27 acetylcholine antibodies negative and MuSK antibodies negative, No autoimmune disorder.    # ESRD on HD TTS Secondary  hyperparathyroidism Calcitriol  deficiency  --recently started on dialysis --HD per nephro   # Normocytic anemia, mild  Likely anemia of chronic disease  Stable.  Iron panel within normal limits.  B12 within normal limits.  Folate within normal limits -ESA per nephrology     Latest Ref Rng & Units 05/12/2024    8:00 AM 05/10/2024    7:58 AM 05/08/2024    7:36 AM  CBC   WBC 4.0 - 10.5 K/uL 7.8  7.1  8.1   Hemoglobin 13.0 - 17.0 g/dL 8.9  8.9  8.8   Hematocrit 39.0 - 52.0 % 28.3  28.0  27.7   Platelets 150 - 400 K/uL 231  197  195     # Hx of Afib --pt not taking anticoagulation PTA.  Currently in sinus. --hold home Toprol  due to low BP   # Depression and hx of panic disorder # Passive SI --psych consulted --no need for SI precaution, per psych --cont home bupropion  and Effexor   # Vitamin D  deficiency: Continue Calcitrol    # OSA --CPAP nightly   # Obesity, Class III, BMI 40-49.9 (morbid obesity)  Calorie restricted diet and daily exercise advised to lose body weight.  Lifestyle modification discussed.   Consults: Nephrology Vascular surgery  Procedures: 9/11 permacath exchange by vascular surgery   DVT prophylaxis: Heparin  SQ Code Status: Full code  Family Communication:  Level of care: Med-Surg Dispo:   The patient is from: home Anticipated d/c is to: SNF rehab, patient is currently unhomed as he was evicted recently. TOC consulted. Patient remains hospitalized given persistent severe orthostatic hypotension  Anticipated d/c date is: whenever TOC find a place for him 9/15 awaiting for group home placement.  Subjective and Interval History:  Patient was seen and examined at bedside today. No any active issues overnight.  Still patient is having orthostatics, able to ambulate without risk of fall.  Still patient is not allowed to get out of the bed by himself, he is calling for help.    Objective: Vitals:   05/14/24 0800 05/14/24 0827 05/14/24 1222 05/14/24 1454  BP: 118/60  (!) 141/95 112/86  Pulse: 71 84 98 83  Resp:    17  Temp:  98.1 F (36.7 C)  98.1 F (36.7 C)  TempSrc:  Oral    SpO2:  95%  96%  Weight:      Height:        Intake/Output Summary (Last 24 hours) at 05/14/2024 1505 Last data filed at 05/13/2024 1900 Gross per 24 hour  Intake 120 ml  Output --  Net 120 ml    Filed Weights   05/10/24 0734  05/10/24 0930 05/12/24 0743  Weight: 120.2 kg 120.2 kg 120.5 kg    Examination:    Constitutional: In no distress.  Cardiovascular: Normal rate, regular rhythm. No lower extremity edema  Pulmonary: Non labored breathing on room air, no wheezing or rales.   Abdominal: Soft. Non distended and non tender Musculoskeletal: Normal range of motion.     Neurological: Alert and oriented to person, place, and time. Non focal  Skin: Skin is warm and dry.     Data Reviewed: I have personally reviewed labs and imaging studies   Time spent: 35 minutes  Elvan Sor, MD [facility] If 7PM-7AM, please contact night-coverage 05/14/2024, 3:05 PM

## 2024-05-14 NOTE — Progress Notes (Signed)
 Mobility Specialist Progress Note:    05/14/24 1222  Therapy Vitals  Pulse Rate 98  BP (!) 141/95  Patient Position (if appropriate) Lying  Orthostatic Lying   BP- Lying (!) 141/95  Pulse- Lying 98  Orthostatic Sitting  BP- Sitting (!) 119/97  Pulse- Sitting 100  Orthostatic Standing at 0 minutes  BP- Standing at 0 minutes 121/80  Pulse- Standing at 0 minutes 106  Mobility  Activity Ambulated with assistance  Level of Assistance Modified independent, requires aide device or extra time  Assistive Device None  Distance Ambulated (ft) 450 ft  Range of Motion/Exercises Active;All extremities  Activity Response Tolerated well  Mobility visit 1 Mobility  Mobility Specialist Start Time (ACUTE ONLY) 1220  Mobility Specialist Stop Time (ACUTE ONLY) 1243  Mobility Specialist Time Calculation (min) (ACUTE ONLY) 23 min   Ted hose, ace wraps, and abdominal binder placed on pt before mobility. Modi to stand and ambulate with no AD. Tolerated well, pt denies dizziness and all symptoms today. HR 113 bpm and BP 146/88 after ambulation. Left pt supine, all needs met.  Sherrilee Ditty Mobility Specialist Please contact via Special educational needs teacher or  Rehab office at [phone]

## 2024-05-14 NOTE — Plan of Care (Signed)
  Problem: Clinical Measurements: Goal: Ability to maintain clinical measurements within normal limits will improve Outcome: Progressing   Problem: Safety: Goal: Ability to remain free from injury will improve Outcome: Progressing   Problem: Pain Managment: Goal: General experience of comfort will improve and/or be controlled Outcome: Progressing

## 2024-05-15 DIAGNOSIS — R531 Weakness: Secondary | ICD-10-CM | POA: Diagnosis not present

## 2024-05-15 LAB — CBC
HCT: 28.1 % — ABNORMAL LOW (ref 39.0–52.0)
Hemoglobin: 8.8 g/dL — ABNORMAL LOW (ref 13.0–17.0)
MCH: 30.7 pg (ref 26.0–34.0)
MCHC: 31.3 g/dL (ref 30.0–36.0)
MCV: 97.9 fL (ref 80.0–100.0)
Platelets: 261 K/uL (ref 150–400)
RBC: 2.87 MIL/uL — ABNORMAL LOW (ref 4.22–5.81)
RDW: 14.6 % (ref 11.5–15.5)
WBC: 5.3 K/uL (ref 4.0–10.5)
nRBC: 0 % (ref 0.0–0.2)

## 2024-05-15 LAB — BASIC METABOLIC PANEL WITH GFR
Anion gap: 14 (ref 5–15)
BUN: 21 mg/dL — ABNORMAL HIGH (ref 6–20)
CO2: 24 mmol/L (ref 22–32)
Calcium: 9 mg/dL (ref 8.9–10.3)
Chloride: 98 mmol/L (ref 98–111)
Creatinine, Ser: 9.87 mg/dL — ABNORMAL HIGH (ref 0.61–1.24)
GFR, Estimated: 6 mL/min — ABNORMAL LOW (ref >60)
Glucose, Bld: 87 mg/dL (ref 70–99)
Potassium: 3.6 mmol/L (ref 3.5–5.1)
Sodium: 136 mmol/L (ref 135–145)

## 2024-05-15 LAB — GLUCOSE, CAPILLARY
Glucose-Capillary: 90 mg/dL (ref 70–99)
Glucose-Capillary: 87 mg/dL (ref 70–99)
Glucose-Capillary: 92 mg/dL (ref 70–99)

## 2024-05-15 LAB — PHOSPHORUS: Phosphorus: 5.8 mg/dL — ABNORMAL HIGH (ref 2.5–4.6)

## 2024-05-15 LAB — MAGNESIUM: Magnesium: 2.3 mg/dL (ref 1.7–2.4)

## 2024-05-15 MED ORDER — HEPARIN SODIUM (PORCINE) 1000 UNIT/ML DIALYSIS
25.0000 [IU]/kg | INTRAMUSCULAR | Status: DC | PRN
  Administered 2024-05-15: 3000 [IU] via INTRAVENOUS_CENTRAL

## 2024-05-15 MED ORDER — HEPARIN SODIUM (PORCINE) 1000 UNIT/ML IJ SOLN
INTRAMUSCULAR | Status: AC
Start: 2024-05-15 — End: 2024-05-15
  Filled 2024-05-15: qty 3

## 2024-05-15 MED ORDER — MIDODRINE HCL 5 MG PO TABS
ORAL_TABLET | ORAL | Status: AC
  Filled 2024-05-15: qty 2

## 2024-05-15 MED ORDER — ALTEPLASE 2 MG IJ SOLR: 2.0000 mg | Freq: Once | INTRAMUSCULAR | Status: DC | PRN

## 2024-05-15 MED ORDER — HEPARIN SODIUM (PORCINE) 1000 UNIT/ML DIALYSIS: 1000.0000 [IU] | INTRAMUSCULAR | Status: DC | PRN

## 2024-05-15 MED ORDER — HEPARIN SODIUM (PORCINE) 1000 UNIT/ML IJ SOLN
INTRAMUSCULAR | Status: AC
  Filled 2024-05-15: qty 4

## 2024-05-15 MED ORDER — EPOETIN ALFA-EPBX 4000 UNIT/ML IJ SOLN
INTRAMUSCULAR | Status: AC
Start: 2024-05-15 — End: 2024-05-15
  Filled 2024-05-15: qty 1

## 2024-05-15 NOTE — Plan of Care (Signed)

## 2024-05-15 NOTE — Progress Notes (Signed)
 PROGRESS NOTE    ALDOUS HOUSEL  FMW:[id number] DOB: Jun 25, 1966 DOA: 04/15/2024 PCP: Vicci Duwaine SQUIBB, DO  147A/147A-AA  LOS: 26 days   Brief hospital course:   ABB GOBERT is a 58 y.o. male with medical history significant of newly ESRD recently started on HD, atrial fibrillation, anxiety and depression, morbid obesity who presented with complaints of weakness and falls.   Pt was very frustrated with his medical and mental problems, and wanted us  to make him better.  Pt complained of fatigue, lack of energy, and weakness, causing to fall several times since he was discharged from hospital on 04/13/24.  No focal neuro deficits.  Pt said he has had long standing mental health issues and needs to see psych.  Pt complained of constipation for the past 2 months that wasn't addressed during his last hospitalization.     Of note, pt was hospitalized from 04/07/24 to 04/13/24 during which he was started on dialysis, and was discharged after dialysis on 8/15 to continue outpatient dialysis TTS.   ED Course: initial vitals: afebrile, pulse 105, BP 86/65, RR 12, sating 99% on room air.  Labs consistent with ESRD.  CXR no acute finding.  EKG showed sinus tachycardia.  CT head no acute finding.  ED provider reported pt said he would be better off not living at this point but does not have a plan for suicide and is going to receive help.  Psych consult placed.  ED provider contacted oncall nephro, who plans dialysis for pt tomorrow.  Assessment & Plan:  CALAN DOREN is a 58 y.o. male with medical history significant of recently diagnosed ESRD now on HD, atrial fibrillation, anxiety and depression, morbid obesity who presented with complaints of weakness and falls.    # Orthostatic Hypotension Persistent, symptomatic when going from seated to standing position. No concern for infection. No known neurological conditions that could be contributing. AM cortisol WNL though drawn at 0500.  -- Continue  to hold home Toprol  due to low BP --Continue midodrine  10 mg TID --TED/compression hose abd binder  -- Increase florinef  0.2mg  every day --Fluid intake liberalized per nephro -- Florinef  and midodrine  remain ineffective need to consider alternative agent 8/28 stared steroids, Solu-Medrol  125 mg IV daily for 3 days, PPI for GI prophylaxis and insulin  sliding scale monitor CBG for hyperglycemia 8/30 patient felt improvement with the steroids, still has orthostasis, BP drops at standing for 3 minutes.  But no problem with ambulation. Started Cortef  50 mg in the morning and 25 mg in the evening, started tapering dose over 5 days. Hopefully patient will not need more steroids and blood pressure will improve in few days. 9/2 blood pressure is improving, RN was advised to hold off midodrine  and Florinef  if patient does not needed, follow parameters 9/3 BP is fluctuating and pt was unable to walk in the morning while working with PT  9/16 orthostatic BPs improving, still blood pressure drops but patient is not symptomatic.  Able to ambulate, without any risk of fall.   # Weakness and fall  --Fatigue and weakness have been progressive, due to recent hospitalization and decline in health.  CT head wnl, no acute finding. Noted to have elevated BUN (unclear of time line regarding rise in BUN). Also noted to have significant orthostatic hypotension. Suspect this is likely cause of fall.  --PT/OT recommending SNF 8/27 acetylcholine antibodies negative and MuSK antibodies negative, No autoimmune disorder.    # ESRD on HD TTS Secondary  hyperparathyroidism Calcitriol  deficiency  --recently started on dialysis --HD per nephro   # Normocytic anemia, mild  Likely anemia of chronic disease  Stable.  Iron panel within normal limits.  B12 within normal limits.  Folate within normal limits -ESA per nephrology     Latest Ref Rng & Units 05/15/2024    8:00 AM 05/12/2024    8:00 AM 05/10/2024    7:58 AM  CBC   WBC 4.0 - 10.5 K/uL 5.3  7.8  7.1   Hemoglobin 13.0 - 17.0 g/dL 8.8  8.9  8.9   Hematocrit 39.0 - 52.0 % 28.1  28.3  28.0   Platelets 150 - 400 K/uL 261  231  197     # Hx of Afib --pt not taking anticoagulation PTA.  Currently in sinus. --hold home Toprol  due to low BP   # Depression and hx of panic disorder # Passive SI --psych consulted --no need for SI precaution, per psych --cont home bupropion  and Effexor   # Vitamin D  deficiency: Continue Calcitrol    # OSA --CPAP nightly   # Obesity, Class III, BMI 40-49.9 (morbid obesity)  Calorie restricted diet and daily exercise advised to lose body weight.  Lifestyle modification discussed.   Consults: Nephrology Vascular surgery  Procedures: 9/11 permacath exchange by vascular surgery   DVT prophylaxis: Heparin  SQ Code Status: Full code  Family Communication:  Level of care: Med-Surg Dispo:   The patient is from: home Anticipated d/c is to: SNF rehab, patient is currently unhomed as he was evicted recently. TOC consulted. Patient remains hospitalized given persistent severe orthostatic hypotension  Anticipated d/c date is: whenever TOC find a place for him 9/16 awaiting for group home placement.  Subjective and Interval History:  Patient was seen and examined at bedside today. No any active issues overnight.  Still patient is having orthostatics, able to ambulate without risk of fall.  Still patient is not allowed to get out of the bed by himself, he is calling for help.    Objective: Vitals:   05/15/24 1140 05/15/24 1145 05/15/24 1207 05/15/24 1439  BP:  110/74 128/84 117/87  Pulse: 90 96 79 96  Resp: 15 16 15 18   Temp: 97.7 F (36.5 C)  98.3 F (36.8 C) 99 F (37.2 C)  TempSrc: Oral     SpO2: 100% 99% 100% 95%  Weight: 119.1 kg     Height:        Intake/Output Summary (Last 24 hours) at 05/15/2024 1505 Last data filed at 05/15/2024 1140 Gross per 24 hour  Intake 240 ml  Output 500 ml  Net -260 ml     Filed Weights   05/12/24 0743 05/15/24 0748 05/15/24 1140  Weight: 120.5 kg 119.8 kg 119.1 kg    Examination:    Constitutional: In no distress.  Cardiovascular: Normal rate, regular rhythm. No lower extremity edema  Pulmonary: Non labored breathing on room air, no wheezing or rales.   Abdominal: Soft. Non distended and non tender Musculoskeletal: Normal range of motion.     Neurological: Alert and oriented to person, place, and time. Non focal  Skin: Skin is warm and dry.     Data Reviewed: I have personally reviewed labs and imaging studies   Time spent: 35 minutes  Elvan Sor, MD [facility] If 7PM-7AM, please contact night-coverage 05/15/2024, 3:05 PM

## 2024-05-15 NOTE — Progress Notes (Signed)
 Hemodialysis Note:  Received patient in bed to unit. Alert and oriented. Informed consent singed and in chart.  Treatment initiated: 0800 Treatment completed: 1140  Access used: Right internal jugular catheter Access issues: None  Patient tolerated well. Transported back to room, alert without acute distress. Report given to patient's RN.  Total UF removed: 500 ml Medications given: Midodrine  10 mg Tablet, Retacrit  4000 uits IV  Post HD weight: 119.1 Kg  [name]

## 2024-05-15 NOTE — Progress Notes (Signed)
 [facility]  ROUNDING NOTE   Subjective:   Patient seen and evaluated during dialysis   HEMODIALYSIS FLOWSHEET:  Blood Flow Rate (mL/min): 399 mL/min Arterial Pressure (mmHg): -167.26 mmHg Venous Pressure (mmHg): 125.45 mmHg TMP (mmHg): 7.68 mmHg Ultrafiltration Rate (mL/min): 578 mL/min Dialysate Flow Rate (mL/min): 299 ml/min Dialysis Fluid Bolus: Albumin  Bolus Amount (mL): 100 mL  Tolerating treatment well   Objective:  Vital signs in last 24 hours:  Temp:  [97.9 F (36.6 C)-98.3 F (36.8 C)] 97.9 F (36.6 C) (09/16 0748) Pulse Rate:  [75-98] 75 (09/16 1030) Resp:  [14-19] 19 (09/16 1030) BP: (101-142)/(63-95) 121/73 (09/16 1030) SpO2:  [93 %-100 %] 96 % (09/16 1030) Weight:  [119.8 kg] 119.8 kg (09/16 0748)  Weight change:  Filed Weights   05/10/24 0930 05/12/24 0743 05/15/24 0748  Weight: 120.2 kg 120.5 kg 119.8 kg    Intake/Output: I/O last 3 completed shifts: In: 360 [P.O.:360] Out: -    Intake/Output this shift:  No intake/output data recorded.  Physical Exam: General: NAD, missing teeth, large habitus  Head: Normocephalic  Eyes: Anicteric  Lungs:  Clear, room air  Heart: Regular rate  Abdomen:  Soft, distended   Extremities:  No peripheral edema.  Neurologic: Alert, awake  Skin: No lesions  Access: Rt chest permcath exchanged on 9/11    Basic Metabolic Panel: Recent Labs  Lab 05/10/24 0758 05/12/24 0800 05/15/24 0800  NA 137 137 136  K 4.3 4.1 3.6  CL 98 98 98  CO2 27 22 24   GLUCOSE 90 85 87  BUN 27* 33* 21*  CREATININE 9.09* 10.56* 9.87*  CALCIUM  9.1 9.2 9.0  MG 2.3 2.3 2.3  PHOS 4.8* 5.5* 5.8*    Liver Function Tests: No results for input(s): AST, ALT, ALKPHOS, BILITOT, PROT, ALBUMIN  in the last 168 hours.  No results for input(s): LIPASE, AMYLASE in the last 168 hours. No results for input(s): AMMONIA in the last 168 hours.   CBC: Recent Labs  Lab 05/10/24 0758 05/12/24 0800  05/15/24 0800  WBC 7.1 7.8 5.3  HGB 8.9* 8.9* 8.8*  HCT 28.0* 28.3* 28.1*  MCV 98.6 98.6 97.9  PLT 197 231 261    Cardiac Enzymes: No results for input(s): CKTOTAL, CKMB, CKMBINDEX, TROPONINI in the last 168 hours.  BNP: Invalid input(s): POCBNP  CBG: Recent Labs  Lab 05/13/24 2110 05/14/24 0737 05/14/24 1150 05/14/24 1728 05/14/24 1959  GLUCAP 94 81 109* 116* 141*    Microbiology: Results for orders placed or performed during the hospital encounter of 04/07/24  Resp panel by RT-PCR (RSV, Flu A&B, Covid) Anterior Nasal Swab     Status: None   Collection Time: 04/07/24  4:46 PM   Specimen: Anterior Nasal Swab  Result Value Ref Range Status   SARS Coronavirus 2 by RT PCR NEGATIVE NEGATIVE Final    Comment: (NOTE) SARS-CoV-2 target nucleic acids are NOT DETECTED.  The SARS-CoV-2 RNA is generally detectable in upper respiratory specimens during the acute phase of infection. The lowest concentration of SARS-CoV-2 viral copies this assay can detect is 138 copies/mL. A negative result does not preclude SARS-Cov-2 infection and should not be used as the sole basis for treatment or other patient management decisions. A negative result may occur with  improper specimen collection/handling, submission of specimen other than nasopharyngeal swab, presence of viral mutation(s) within the areas targeted by this assay, and inadequate number of viral copies(<138 copies/mL). A negative result must be combined with clinical observations, patient history, and  epidemiological information. The expected result is Negative.  Fact Sheet for Patients:  BloggerCourse.com  Fact Sheet for Healthcare Providers:  SeriousBroker.it  This test is no t yet approved or cleared by the [place]  FDA and  has been authorized for detection and/or diagnosis of SARS-CoV-2 by FDA under an Emergency Use Authorization (EUA). This EUA will  remain  in effect (meaning this test can be used) for the duration of the COVID-19 declaration under Section 564(b)(1) of the Act, 21 U.S.C.section 360bbb-3(b)(1), unless the authorization is terminated  or revoked sooner.       Influenza A by PCR NEGATIVE NEGATIVE Final   Influenza B by PCR NEGATIVE NEGATIVE Final    Comment: (NOTE) The Xpert Xpress SARS-CoV-2/FLU/RSV plus assay is intended as an aid in the diagnosis of influenza from Nasopharyngeal swab specimens and should not be used as a sole basis for treatment. Nasal washings and aspirates are unacceptable for Xpert Xpress SARS-CoV-2/FLU/RSV testing.  Fact Sheet for Patients: BloggerCourse.com  Fact Sheet for Healthcare Providers: SeriousBroker.it  This test is not yet approved or cleared by the [place]  FDA and has been authorized for detection and/or diagnosis of SARS-CoV-2 by FDA under an Emergency Use Authorization (EUA). This EUA will remain in effect (meaning this test can be used) for the duration of the COVID-19 declaration under Section 564(b)(1) of the Act, 21 U.S.C. section 360bbb-3(b)(1), unless the authorization is terminated or revoked.     Resp Syncytial Virus by PCR NEGATIVE NEGATIVE Final    Comment: (NOTE) Fact Sheet for Patients: BloggerCourse.com  Fact Sheet for Healthcare Providers: SeriousBroker.it  This test is not yet approved or cleared by the [place]  FDA and has been authorized for detection and/or diagnosis of SARS-CoV-2 by FDA under an Emergency Use Authorization (EUA). This EUA will remain in effect (meaning this test can be used) for the duration of the COVID-19 declaration under Section 564(b)(1) of the Act, 21 U.S.C. section 360bbb-3(b)(1), unless the authorization is terminated or revoked.  Performed at [facility], [address] [place], [place] [address]      Coagulation Studies: No results for input(s): LABPROT, INR in the last 72 hours.  Urinalysis: No results for input(s): COLORURINE, LABSPEC, PHURINE, GLUCOSEU, HGBUR, BILIRUBINUR, KETONESUR, PROTEINUR, UROBILINOGEN, NITRITE, LEUKOCYTESUR in the last 72 hours.  Invalid input(s): APPERANCEUR    Imaging: No results found.    Medications:     bisacodyl   10 mg Oral QHS   buPROPion   300 mg Oral Daily   calcitRIOL   0.25 mcg Oral Daily   Chlorhexidine  Gluconate Cloth  6 each Topical Daily   epoetin  alfa-epbx (RETACRIT ) injection  4,000 Units Intravenous Q T,Th,Sat-1800   fludrocortisone   0.4 mg Oral Daily   heparin   5,000 Units Subcutaneous Q8H   heparin  sodium (porcine)  4,200 Units Intracatheter Once   insulin  aspart  0-15 Units Subcutaneous TID WC   midodrine   10 mg Oral TID WC   polyethylene glycol  17 g Oral BID   sevelamer  carbonate  1,600 mg Oral TID WC   venlafaxine  XR  150 mg Oral Q breakfast   And   venlafaxine  XR  75 mg Oral Q breakfast   acetaminophen , alteplase , bisacodyl , butalbital -acetaminophen -caffeine , heparin , heparin , ondansetron  (ZOFRAN ) IV, ondansetron   Assessment/ Plan:  [name] is a 58 y.o.  male  with a PMHx of hypertension, atrial fibrillation, BPH, hyperlipidemia, anxiety, constipation, morbid obesity, and end stage renal disease on hemodialysis.    [facility]- will  need to resubmit for chair at discharge.    End stage renal disease on hemodialysis       Appreciate vascular performing permcath exchange on 9/11. Receiving dialysis today, UF 0.5-1L as tolerated. Next treatment scheduled for Thursday.    2.  Acute metabolic acidosis.  Serum bicarbonate 16 on admission. Managed with dialysis   3.  Anemia of chronic kidney disease. Hgb 8.8. Continue retacrit  4000 units with dialysis.     4.  Secondary hyperparathyroidism. PTH 199 during recent admission. Continue Renvela  2 tabs TID.   Will continue  to monitor bone minerals during this admission.  5. Severe orthostatic hypotension Workup so far has included 2D echo from 04/11/2024 which shows LVEF 65 to 70%, normal LV systolic function, mild concentric LVH, normal diastolic parameters, normal right ventricular systolic function.  A.m. cortisol level normal from 04/24/2024.  TSH normal.  Other differential includes amyloidosis.  Kidney biopsy or fat pad biopsy can be considered. Given steroids. Continue TEDs, midodrine  and abd binders. Florinef  to 0.4mg  daily for blood pressure support. Symptoms have improved, able to ambulate in room      LOS: 26 Demere Dotzler 9/16/202510:54 AM

## 2024-05-15 NOTE — Progress Notes (Signed)
 [id number] Pt being transported to dialysis at this time

## 2024-05-16 LAB — GLUCOSE, CAPILLARY: Glucose-Capillary: 81 mg/dL (ref 70–99)

## 2024-05-16 NOTE — TOC Progression Note (Signed)
 Transition of Care [facility]) - Progression Note    Patient Details  Name: Brady Edwards MRN: [id number] Date of Birth: 02-12-1966  Transition of Care [facility]) CM/SW Contact  Alvaro Louder, [place] Phone Number: 05/16/2024, 4:39 PM  Clinical Narrative:   [name] Sermon to patient at the bedside. Patient indicated that he has been in contact with the [place]. He indicated that he has to call [employer] [facility] [place] to find out information related to obtaining income. The patient indicated that they will pay 5 months of his rent if he is able to obtain this funding. LCSWA also spoke to group home coordinator Nicki and she indicated that she cannot place him unless he has funding.   TOC to continue to follow for discharge                       Expected Discharge Plan and Services                                               Social Drivers of Health (SDOH) Interventions SDOH Screenings   Food Insecurity: Food Insecurity Present (04/16/2024)  Housing: High Risk (04/16/2024)  Transportation Needs: Unmet Transportation Needs (04/16/2024)  Utilities: At Risk (04/16/2024)  Depression (PHQ2-9): High Risk (02/27/2024)  Tobacco Use: High Risk (04/07/2024)    Readmission Risk Interventions     No data to display

## 2024-05-16 NOTE — Plan of Care (Signed)

## 2024-05-16 NOTE — Progress Notes (Signed)
 Mobility Specialist Progress Note:    05/16/24 1557  Orthostatic Lying   BP- Lying (!) 134/95  Pulse- Lying 81  Orthostatic Sitting  BP- Sitting (!) 131/95  Pulse- Sitting 105  Orthostatic Standing at 0 minutes  BP- Standing at 0 minutes 105/80  Pulse- Standing at 0 minutes 100  Orthostatic Standing at 3 minutes  BP- Standing at 3 minutes (!) 127/93  Pulse- Standing at 3 minutes 106  Mobility  Activity Ambulated with assistance  Level of Assistance Modified independent, requires aide device or extra time  Assistive Device None  Distance Ambulated (ft) 500 ft  Range of Motion/Exercises Active;All extremities  Activity Response Tolerated well  Mobility visit 1 Mobility  Mobility Specialist Start Time (ACUTE ONLY) 1554  Mobility Specialist Stop Time (ACUTE ONLY) 1619  Mobility Specialist Time Calculation (min) (ACUTE ONLY) 25 min   Ted hose, ace wraps, and abdominal binder were placed prior to mobility. Pt denies dizziness throughout this session. Able to ambulate more and only c/o fatigue after ambulation. HR 114 bpm after ambulation and 84 bpm after 2 minutes of rest. Left pt supine, all needs met.   Sherrilee Ditty Mobility Specialist Please contact via Special educational needs teacher or  Rehab office at [phone]

## 2024-05-16 NOTE — Progress Notes (Signed)
 PROGRESS NOTE    Brady Edwards  FMW:[id number] DOB: 1965/10/19 DOA: 04/15/2024 PCP: Vicci Duwaine SQUIBB, DO  147A/147A-AA  LOS: 27 days   Brief hospital course:   Brady Edwards is a 58 y.o. male with medical history significant of newly ESRD recently started on HD, atrial fibrillation, anxiety and depression, morbid obesity who presented with complaints of weakness and falls.   Pt was very frustrated with his medical and mental problems, and wanted us  to make him better.  Pt complained of fatigue, lack of energy, and weakness, causing to fall several times since he was discharged from hospital on 04/13/24.  No focal neuro deficits.  Pt said he has had long standing mental health issues and needs to see psych.  Pt complained of constipation for the past 2 months that wasn't addressed during his last hospitalization.     Of note, pt was hospitalized from 04/07/24 to 04/13/24 during which he was started on dialysis, and was discharged after dialysis on 8/15 to continue outpatient dialysis TTS.   ED Course: initial vitals: afebrile, pulse 105, BP 86/65, RR 12, sating 99% on room air.  Labs consistent with ESRD.  CXR no acute finding.  EKG showed sinus tachycardia.  CT head no acute finding.  ED provider reported pt said he would be better off not living at this point but does not have a plan for suicide and is going to receive help.  Psych consult placed.  ED provider contacted oncall nephro, who plans dialysis for pt tomorrow.  Assessment & Plan:  Brady Edwards is a 58 y.o. male with medical history significant of recently diagnosed ESRD now on HD, atrial fibrillation, anxiety and depression, morbid obesity who presented with complaints of weakness and falls.    # Orthostatic Hypotension Persistent, symptomatic when going from seated to standing position. No concern for infection. No known neurological conditions that could be contributing. AM cortisol WNL though drawn at 0500.  -- Continue  to hold home Toprol  due to low BP --Continue midodrine  10 mg TID --TED/compression hose abd binder  -- Increase florinef  0.2mg  every day --Fluid intake liberalized per nephro -- Florinef  and midodrine  remain ineffective need to consider alternative agent 8/28 stared steroids, Solu-Medrol  125 mg IV daily for 3 days, PPI for GI prophylaxis and insulin  sliding scale monitor CBG for hyperglycemia 8/30 patient felt improvement with the steroids, still has orthostasis, BP drops at standing for 3 minutes.  But no problem with ambulation. Started Cortef  50 mg in the morning and 25 mg in the evening, started tapering dose over 5 days. Hopefully patient will not need more steroids and blood pressure will improve in few days. 9/2 blood pressure is improving, RN was advised to hold off midodrine  and Florinef  if patient does not needed, follow parameters 9/3 BP is fluctuating and pt was unable to walk in the morning while working with PT  9/16 orthostatic BPs improving, still blood pressure drops but patient is not symptomatic.  Able to ambulate, without any risk of fall.   # Weakness and fall  --Fatigue and weakness have been progressive, due to recent hospitalization and decline in health.  CT head wnl, no acute finding. Noted to have elevated BUN (unclear of time line regarding rise in BUN). Also noted to have significant orthostatic hypotension. Suspect this is likely cause of fall.  --PT/OT recommending SNF 8/27 acetylcholine antibodies negative and MuSK antibodies negative, No autoimmune disorder.    # ESRD on HD TTS Secondary  hyperparathyroidism Calcitriol  deficiency  --recently started on dialysis --HD per nephro   # Normocytic anemia, mild  Likely anemia of chronic disease  Stable.  Iron panel within normal limits.  B12 within normal limits.  Folate within normal limits -ESA per nephrology     Latest Ref Rng & Units 05/15/2024    8:00 AM 05/12/2024    8:00 AM 05/10/2024    7:58 AM  CBC   WBC 4.0 - 10.5 K/uL 5.3  7.8  7.1   Hemoglobin 13.0 - 17.0 g/dL 8.8  8.9  8.9   Hematocrit 39.0 - 52.0 % 28.1  28.3  28.0   Platelets 150 - 400 K/uL 261  231  197     # Hx of Afib --pt not taking anticoagulation PTA.  Currently in sinus. --hold home Toprol  due to low BP   # Depression and hx of panic disorder # Passive SI --psych consulted --no need for SI precaution, per psych --cont home bupropion  and Effexor   # Vitamin D  deficiency: Continue Calcitrol    # OSA --CPAP nightly   # Obesity, Class III, BMI 40-49.9 (morbid obesity)  Calorie restricted diet and daily exercise advised to lose body weight.  Lifestyle modification discussed.   Consults: Nephrology Vascular surgery  Procedures: 9/11 permacath exchange by vascular surgery   DVT prophylaxis: Heparin  SQ Code Status: Full code  Family Communication:  Level of care: Med-Surg Dispo:   The patient is from: home Anticipated d/c is to: SNF rehab, patient is currently unhomed as he was evicted recently. TOC consulted. Patient remains hospitalized given persistent severe orthostatic hypotension  Anticipated d/c date is: whenever TOC find a place for him 9/17 awaiting for group home placement. Applied for disability, and awaiting for financial support as patient has no income.   Subjective and Interval History:  Patient was seen and examined at bedside today. No any active issues overnight.  Orthostatic blood pressure is improving, patient is not dizzy as he used to be.      Objective: Vitals:   05/15/24 2052 05/16/24 0353 05/16/24 0726 05/16/24 1557  BP: 132/87 129/88 (!) 146/86 (!) 134/95  Pulse: 76 91 80 82  Resp: 18 16 18 18   Temp: 98.4 F (36.9 C) 97.6 F (36.4 C) 97.7 F (36.5 C) 98.1 F (36.7 C)  TempSrc:  Oral    SpO2: 98% 96% 97% 99%  Weight:      Height:        Intake/Output Summary (Last 24 hours) at 05/16/2024 1621 Last data filed at 05/16/2024 0924 Gross per 24 hour  Intake 360 ml   Output --  Net 360 ml    Filed Weights   05/12/24 0743 05/15/24 0748 05/15/24 1140  Weight: 120.5 kg 119.8 kg 119.1 kg    Examination:    Constitutional: In no distress.  Cardiovascular: Normal rate, regular rhythm. No lower extremity edema  Pulmonary: Non labored breathing on room air, no wheezing or rales.   Abdominal: Soft. Non distended and non tender Musculoskeletal: Normal range of motion.     Neurological: Alert and oriented to person, place, and time. Non focal  Skin: Skin is warm and dry.     Data Reviewed: I have personally reviewed labs and imaging studies   Time spent: 35 minutes  Elvan Sor, MD [facility] If 7PM-7AM, please contact night-coverage 05/16/2024, 4:21 PM

## 2024-05-17 DIAGNOSIS — R531 Weakness: Secondary | ICD-10-CM | POA: Diagnosis not present

## 2024-05-17 LAB — RENAL FUNCTION PANEL
Albumin: 3.2 g/dL — ABNORMAL LOW (ref 3.5–5.0)
Anion gap: 11 (ref 5–15)
BUN: 15 mg/dL (ref 6–20)
CO2: 28 mmol/L (ref 22–32)
Calcium: 8.9 mg/dL (ref 8.9–10.3)
Chloride: 95 mmol/L — ABNORMAL LOW (ref 98–111)
Creatinine, Ser: 8.06 mg/dL — ABNORMAL HIGH (ref 0.61–1.24)
GFR, Estimated: 7 mL/min — ABNORMAL LOW (ref >60)
Glucose, Bld: 87 mg/dL (ref 70–99)
Phosphorus: 4.7 mg/dL — ABNORMAL HIGH (ref 2.5–4.6)
Potassium: 3.5 mmol/L (ref 3.5–5.1)
Sodium: 134 mmol/L — ABNORMAL LOW (ref 135–145)

## 2024-05-17 LAB — CBC
HCT: 29.3 % — ABNORMAL LOW (ref 39.0–52.0)
Hemoglobin: 9.6 g/dL — ABNORMAL LOW (ref 13.0–17.0)
MCH: 31.9 pg (ref 26.0–34.0)
MCHC: 32.8 g/dL (ref 30.0–36.0)
MCV: 97.3 fL (ref 80.0–100.0)
Platelets: 316 K/uL (ref 150–400)
RBC: 3.01 MIL/uL — ABNORMAL LOW (ref 4.22–5.81)
RDW: 14.6 % (ref 11.5–15.5)
WBC: 5.8 K/uL (ref 4.0–10.5)
nRBC: 0 % (ref 0.0–0.2)

## 2024-05-17 LAB — PHOSPHORUS: Phosphorus: 5 mg/dL — ABNORMAL HIGH (ref 2.5–4.6)

## 2024-05-17 LAB — MAGNESIUM: Magnesium: 2.1 mg/dL (ref 1.7–2.4)

## 2024-05-17 MED ORDER — EPOETIN ALFA-EPBX 4000 UNIT/ML IJ SOLN
INTRAMUSCULAR | Status: AC
  Filled 2024-05-17: qty 1

## 2024-05-17 MED ORDER — HEPARIN SODIUM (PORCINE) 1000 UNIT/ML DIALYSIS
1000.0000 [IU] | INTRAMUSCULAR | Status: DC | PRN
  Administered 2024-05-17: 1000 [IU]

## 2024-05-17 MED ORDER — ALTEPLASE 2 MG IJ SOLR: 2.0000 mg | Freq: Once | INTRAMUSCULAR | Status: DC | PRN

## 2024-05-17 MED ORDER — HEPARIN SODIUM (PORCINE) 1000 UNIT/ML IJ SOLN
INTRAMUSCULAR | Status: AC
  Filled 2024-05-17: qty 4

## 2024-05-17 NOTE — Progress Notes (Signed)
 PROGRESS NOTE    Brady Edwards  FMW:[id number] DOB: [birth year] DOA: 04/15/2024 PCP: Vicci Duwaine SQUIBB, DO  147A/147A-AA  LOS: 28 days   Brief hospital course:   Brady Edwards is a 58 y.o. male with medical history significant of newly ESRD recently started on HD, atrial fibrillation, anxiety and depression, morbid obesity who presented with complaints of weakness and falls.   Pt was very frustrated with his medical and mental problems, and wanted us  to make him better.  Pt complained of fatigue, lack of energy, and weakness, causing to fall several times since he was discharged from hospital on 04/13/24.  No focal neuro deficits.  Pt said he has had long standing mental health issues and needs to see psych.  Pt complained of constipation for the past 2 months that wasn't addressed during his last hospitalization.     Of note, pt was hospitalized from 04/07/24 to 04/13/24 during which he was started on dialysis, and was discharged after dialysis on 8/15 to continue outpatient dialysis TTS.   ED Course: initial vitals: afebrile, pulse 105, BP 86/65, RR 12, sating 99% on room air.  Labs consistent with ESRD.  CXR no acute finding.  EKG showed sinus tachycardia.  CT head no acute finding.  ED provider reported pt said he would be better off not living at this point but does not have a plan for suicide and is going to receive help.  Psych consult placed.  ED provider contacted oncall nephro, who plans dialysis for pt tomorrow.  Assessment & Plan:  Brady Edwards is a 58 y.o. male with medical history significant of recently diagnosed ESRD now on HD, atrial fibrillation, anxiety and depression, morbid obesity who presented with complaints of weakness and falls.    # Orthostatic Hypotension Persistent, symptomatic when going from seated to standing position. No concern for infection. No known neurological conditions that could be contributing. AM cortisol WNL though drawn at 0500.  -- Continue  to hold home Toprol  due to low BP --Continue midodrine  10 mg TID --TED/compression hose abd binder  -- Increase florinef  0.2mg  every day --Fluid intake liberalized per nephro -- Florinef  and midodrine  remain ineffective need to consider alternative agent 8/28 stared steroids, Solu-Medrol  125 mg IV daily for 3 days, PPI for GI prophylaxis and insulin  sliding scale monitor CBG for hyperglycemia 8/30 patient felt improvement with the steroids, still has orthostasis, BP drops at standing for 3 minutes.  But no problem with ambulation. Started Cortef  50 mg in the morning and 25 mg in the evening, started tapering dose over 5 days. Hopefully patient will not need more steroids and blood pressure will improve in few days. 9/2 blood pressure is improving, RN was advised to hold off midodrine  and Florinef  if patient does not needed, follow parameters 9/3 BP is fluctuating and pt was unable to walk in the morning while working with PT  9/16 orthostatic BPs improving, still blood pressure drops but patient is not symptomatic.  Able to ambulate, without any risk of fall.   # Weakness and fall  --Fatigue and weakness have been progressive, due to recent hospitalization and decline in health.  CT head wnl, no acute finding. Noted to have elevated BUN (unclear of time line regarding rise in BUN). Also noted to have significant orthostatic hypotension. Suspect this is likely cause of fall.  --PT/OT recommending SNF 8/27 acetylcholine antibodies negative and MuSK antibodies negative, No autoimmune disorder.    # ESRD on HD TTS Secondary  hyperparathyroidism Calcitriol  deficiency  --recently started on dialysis --HD per nephro   # Normocytic anemia, mild  Likely anemia of chronic disease  Stable.  Iron panel within normal limits.  B12 within normal limits.  Folate within normal limits -ESA per nephrology     Latest Ref Rng & Units 05/17/2024    8:30 AM 05/15/2024    8:00 AM 05/12/2024    8:00 AM  CBC   WBC 4.0 - 10.5 K/uL 5.8  5.3  7.8   Hemoglobin 13.0 - 17.0 g/dL 9.6  8.8  8.9   Hematocrit 39.0 - 52.0 % 29.3  28.1  28.3   Platelets 150 - 400 K/uL 316  261  231    # Hx of Afib --pt not taking anticoagulation PTA.  Currently in sinus. --hold home Toprol  due to low BP   # Depression and hx of panic disorder # Passive SI --psych consulted --no need for SI precaution, per psych --cont home bupropion  and Effexor   # Vitamin D  deficiency: Continue Calcitrol    # OSA --CPAP nightly   # Obesity, Class III, BMI 40-49.9 (morbid obesity)  Calorie restricted diet and daily exercise advised to lose body weight.  Lifestyle modification discussed.   Consults: Nephrology Vascular surgery  Procedures: 9/11 permacath exchange by vascular surgery   DVT prophylaxis: Heparin  SQ Code Status: Full code  Family Communication:  Level of care: Med-Surg Dispo:   The patient is from: home Anticipated d/c is to: SNF rehab, patient is currently unhomed as he was evicted recently. TOC consulted. Patient remains hospitalized given persistent severe orthostatic hypotension  Anticipated d/c date is: whenever TOC find a place for him 9/17 awaiting for group home placement. Applied for disability, and awaiting for financial support as patient has no income.   Subjective and Interval History:  No issues overnight.      Objective: Vitals:   05/17/24 1230 05/17/24 1300 05/17/24 1347 05/17/24 1454  BP:  (!) 127/90  123/85  Pulse:  80  (!) 106  Resp: (!) 21 18  18   Temp:  98.5 F (36.9 C)  98.4 F (36.9 C)  TempSrc:  Oral    SpO2: 99%   90%  Weight:   117.3 kg   Height:        Intake/Output Summary (Last 24 hours) at 05/17/2024 1830 Last data filed at 05/17/2024 1330 Gross per 24 hour  Intake --  Output 400 ml  Net -400 ml    Filed Weights   05/15/24 1140 05/17/24 0800 05/17/24 1347  Weight: 119.1 kg 118 kg 117.3 kg    Examination:    Physical Exam  Constitutional: In no  distress.  Cardiovascular: Normal rate, regular rhythm. No lower extremity edema  Pulmonary: Non labored breathing on room air, no wheezing or rales.   Abdominal: Soft. Non distended and non tender Musculoskeletal: Normal range of motion.     Neurological: Alert and oriented to person, place, and time. Non focal  Skin: Skin is warm and dry.      Data Reviewed: I have personally reviewed labs and imaging studies   Time spent: 25 minutes  Alban Pepper, MD [facility] If 7PM-7AM, please contact night-coverage 05/17/2024, 6:30 PM

## 2024-05-17 NOTE — Plan of Care (Signed)

## 2024-05-17 NOTE — Progress Notes (Addendum)
 [facility]  ROUNDING NOTE   Subjective:   Patient seen and evaluated during dialysis   HEMODIALYSIS FLOWSHEET:  Blood Flow Rate (mL/min): 399 mL/min Arterial Pressure (mmHg): -201.2 mmHg Venous Pressure (mmHg): 154.54 mmHg TMP (mmHg): -3.23 mmHg Ultrafiltration Rate (mL/min): 394 mL/min Dialysate Flow Rate (mL/min): 300 ml/min Dialysis Fluid Bolus: Normal Saline Bolus Amount (mL): 100 mL  Resting comfortably during treatment   Objective:  Vital signs in last 24 hours:  Temp:  [98 F (36.7 C)-98.2 F (36.8 C)] 98.2 F (36.8 C) (09/18 0803) Pulse Rate:  [68-91] 91 (09/18 1030) Resp:  [17-29] 19 (09/18 1030) BP: (107-170)/(75-99) 170/93 (09/18 1030) SpO2:  [87 %-99 %] 99 % (09/18 1030) Weight:  [881 kg] 118 kg (09/18 0800)  Weight change:  Filed Weights   05/15/24 0748 05/15/24 1140 05/17/24 0800  Weight: 119.8 kg 119.1 kg 118 kg    Intake/Output: I/O last 3 completed shifts: In: 240 [P.O.:240] Out: -    Intake/Output this shift:  No intake/output data recorded.  Physical Exam: General: NAD, missing teeth, large habitus  Head: Normocephalic  Eyes: Anicteric  Lungs:  Clear, room air  Heart: Regular rate  Abdomen:  Soft, distended   Extremities:  No peripheral edema.  Neurologic: Alert, awake  Skin: No lesions  Access: Rt chest permcath exchanged on 9/11    Basic Metabolic Panel: Recent Labs  Lab 05/12/24 0800 05/15/24 0800 05/17/24 0830  NA 137 136 134*  K 4.1 3.6 3.5  CL 98 98 95*  CO2 22 24 28   GLUCOSE 85 87 87  BUN 33* 21* 15  CREATININE 10.56* 9.87* 8.06*  CALCIUM  9.2 9.0 8.9  MG 2.3 2.3 2.1  PHOS 5.5* 5.8* 4.7*  5.0*    Liver Function Tests: Recent Labs  Lab 05/17/24 0830  ALBUMIN  3.2*    No results for input(s): LIPASE, AMYLASE in the last 168 hours. No results for input(s): AMMONIA in the last 168 hours.   CBC: Recent Labs  Lab 05/12/24 0800 05/15/24 0800 05/17/24 0830  WBC 7.8 5.3 5.8  HGB 8.9*  8.8* 9.6*  HCT 28.3* 28.1* 29.3*  MCV 98.6 97.9 97.3  PLT 231 261 316    Cardiac Enzymes: No results for input(s): CKTOTAL, CKMB, CKMBINDEX, TROPONINI in the last 168 hours.  BNP: Invalid input(s): POCBNP  CBG: Recent Labs  Lab 05/14/24 1959 05/15/24 1226 05/15/24 1633 05/15/24 2123 05/16/24 0759  GLUCAP 141* 90 87 92 81    Microbiology: Results for orders placed or performed during the hospital encounter of 04/07/24  Resp panel by RT-PCR (RSV, Flu A&B, Covid) Anterior Nasal Swab     Status: None   Collection Time: 04/07/24  4:46 PM   Specimen: Anterior Nasal Swab  Result Value Ref Range Status   SARS Coronavirus 2 by RT PCR NEGATIVE NEGATIVE Final    Comment: (NOTE) SARS-CoV-2 target nucleic acids are NOT DETECTED.  The SARS-CoV-2 RNA is generally detectable in upper respiratory specimens during the acute phase of infection. The lowest concentration of SARS-CoV-2 viral copies this assay can detect is 138 copies/mL. A negative result does not preclude SARS-Cov-2 infection and should not be used as the sole basis for treatment or other patient management decisions. A negative result may occur with  improper specimen collection/handling, submission of specimen other than nasopharyngeal swab, presence of viral mutation(s) within the areas targeted by this assay, and inadequate number of viral copies(<138 copies/mL). A negative result must be combined with clinical observations, patient history, and  epidemiological information. The expected result is Negative.  Fact Sheet for Patients:  BloggerCourse.com  Fact Sheet for Healthcare Providers:  SeriousBroker.it  This test is no t yet approved or cleared by the [place]  FDA and  has been authorized for detection and/or diagnosis of SARS-CoV-2 by FDA under an Emergency Use Authorization (EUA). This EUA will remain  in effect (meaning this test can be used)  for the duration of the COVID-19 declaration under Section 564(b)(1) of the Act, 21 U.S.C.section 360bbb-3(b)(1), unless the authorization is terminated  or revoked sooner.       Influenza A by PCR NEGATIVE NEGATIVE Final   Influenza B by PCR NEGATIVE NEGATIVE Final    Comment: (NOTE) The Xpert Xpress SARS-CoV-2/FLU/RSV plus assay is intended as an aid in the diagnosis of influenza from Nasopharyngeal swab specimens and should not be used as a sole basis for treatment. Nasal washings and aspirates are unacceptable for Xpert Xpress SARS-CoV-2/FLU/RSV testing.  Fact Sheet for Patients: BloggerCourse.com  Fact Sheet for Healthcare Providers: SeriousBroker.it  This test is not yet approved or cleared by the [place]  FDA and has been authorized for detection and/or diagnosis of SARS-CoV-2 by FDA under an Emergency Use Authorization (EUA). This EUA will remain in effect (meaning this test can be used) for the duration of the COVID-19 declaration under Section 564(b)(1) of the Act, 21 U.S.C. section 360bbb-3(b)(1), unless the authorization is terminated or revoked.     Resp Syncytial Virus by PCR NEGATIVE NEGATIVE Final    Comment: (NOTE) Fact Sheet for Patients: BloggerCourse.com  Fact Sheet for Healthcare Providers: SeriousBroker.it  This test is not yet approved or cleared by the [place]  FDA and has been authorized for detection and/or diagnosis of SARS-CoV-2 by FDA under an Emergency Use Authorization (EUA). This EUA will remain in effect (meaning this test can be used) for the duration of the COVID-19 declaration under Section 564(b)(1) of the Act, 21 U.S.C. section 360bbb-3(b)(1), unless the authorization is terminated or revoked.  Performed at [facility], [address] [place], [place] [address]     Coagulation Studies: No results for  input(s): LABPROT, INR in the last 72 hours.  Urinalysis: No results for input(s): COLORURINE, LABSPEC, PHURINE, GLUCOSEU, HGBUR, BILIRUBINUR, KETONESUR, PROTEINUR, UROBILINOGEN, NITRITE, LEUKOCYTESUR in the last 72 hours.  Invalid input(s): APPERANCEUR    Imaging: No results found.    Medications:     bisacodyl   10 mg Oral QHS   buPROPion   300 mg Oral Daily   calcitRIOL   0.25 mcg Oral Daily   Chlorhexidine  Gluconate Cloth  6 each Topical Daily   epoetin  alfa-epbx (RETACRIT ) injection  4,000 Units Intravenous Q T,Th,Sat-1800   fludrocortisone   0.4 mg Oral Daily   heparin   5,000 Units Subcutaneous Q8H   heparin  sodium (porcine)  4,200 Units Intracatheter Once   midodrine   10 mg Oral TID WC   polyethylene glycol  17 g Oral BID   sevelamer  carbonate  1,600 mg Oral TID WC   venlafaxine  XR  150 mg Oral Q breakfast   And   venlafaxine  XR  75 mg Oral Q breakfast   acetaminophen , bisacodyl , butalbital -acetaminophen -caffeine , ondansetron  (ZOFRAN ) IV, ondansetron   Assessment/ Plan:  Brady Edwards is a 58 y.o.  male  with a PMHx of hypertension, atrial fibrillation, BPH, hyperlipidemia, anxiety, constipation, morbid obesity, and end stage renal disease on hemodialysis.    [facility]- will need to resubmit for chair at discharge.    End stage renal  disease on hemodialysis       Appreciate vascular performing permcath exchange on 9/11. Receiving dialysis today, UF 0. Next treatment scheduled for Saturday.    2.  Acute metabolic acidosis.  Serum bicarbonate 16 on admission. Managed with dialysis   3.  Anemia of chronic kidney disease. Hgb 9.6. Continue retacrit  4000 units with dialysis.     4.  Secondary hyperparathyroidism. PTH 199 during recent admission. Continue Renvela  2 tabs TID.   Calcium  and phos within optimal range  5. Severe orthostatic hypotension Workup so far has included 2D echo from 04/11/2024 which shows LVEF 65 to 70%,  normal LV systolic function, mild concentric LVH, normal diastolic parameters, normal right ventricular systolic function.  A.m. cortisol level normal from 04/24/2024.  TSH normal.  Continue TEDs,and abd binders.  Also continued on midodrine  10 mg 3 times a day along with Florinef  to 0.4mg  daily for blood pressure support. Symptoms have improved, able to ambulate in room      LOS: 28 Brady Edwards 9/18/202511:12 AM    Patient was seen and examined with Faith Harris, NP.  Plan of care was formulated for the problems addressed and discussed with NP.  I agree with the note as documented except as noted below.

## 2024-05-17 NOTE — Progress Notes (Signed)
  Received patient in bed to unit.   Informed consent signed and in chart.    TX duration:3.30     Transported by  Hand-off given to patient's nurse.    Access used: cath Access issues: high arteria pressures. Clot in chambers had to change cartilage.   Total UF removed: 400 ml Medication(s) given: Epo 4000 Post HD VS: wnl Post HD weight: 117.3 kg     N. Ramandeep Arington LPN [facility]

## 2024-05-18 DIAGNOSIS — R531 Weakness: Secondary | ICD-10-CM | POA: Diagnosis not present

## 2024-05-18 NOTE — Plan of Care (Signed)

## 2024-05-18 NOTE — Progress Notes (Signed)
 PROGRESS NOTE    Brady Edwards  FMW:[id number] DOB: 1966-07-18 DOA: 04/15/2024 PCP: Vicci Duwaine SQUIBB, DO  147A/147A-AA  LOS: 29 days   Brief hospital course:   Brady Edwards is a 58 y.o. male with medical history significant of newly ESRD recently started on HD, atrial fibrillation, anxiety and depression, morbid obesity who presented with complaints of weakness and falls.   Pt was very frustrated with his medical and mental problems, and wanted us  to make him better.  Pt complained of fatigue, lack of energy, and weakness, causing to fall several times since he was discharged from hospital on 04/13/24.  No focal neuro deficits.  Pt said he has had long standing mental health issues and needs to see psych.  Pt complained of constipation for the past 2 months that wasn't addressed during his last hospitalization.     Of note, pt was hospitalized from 04/07/24 to 04/13/24 during which he was started on dialysis, and was discharged after dialysis on 8/15 to continue outpatient dialysis TTS.   ED Course: initial vitals: afebrile, pulse 105, BP 86/65, RR 12, sating 99% on room air.  Labs consistent with ESRD.  CXR no acute finding.  EKG showed sinus tachycardia.  CT head no acute finding.  ED provider reported pt said he would be better off not living at this point but does not have a plan for suicide and is going to receive help.  Psych consult placed.  ED provider contacted oncall nephro, who plans dialysis for pt tomorrow.  Assessment & Plan:  Brady Edwards is a 58 y.o. male with medical history significant of recently diagnosed ESRD now on HD, atrial fibrillation, anxiety and depression, morbid obesity who presented with complaints of weakness and falls.    # Orthostatic Hypotension Persistent, symptomatic when going from seated to standing position. No concern for infection. No known neurological conditions that could be contributing. AM cortisol WNL though drawn at 0500.  -- Continue  to hold home Toprol  due to low BP --Continue midodrine  10 mg TID --TED/compression hose abd binder  -- Increase florinef  0.2mg  every day --Fluid intake liberalized per nephro -- Florinef  and midodrine  remain ineffective need to consider alternative agent 8/28 stared steroids, Solu-Medrol  125 mg IV daily for 3 days, PPI for GI prophylaxis and insulin  sliding scale monitor CBG for hyperglycemia 8/30 patient felt improvement with the steroids, still has orthostasis, BP drops at standing for 3 minutes.  But no problem with ambulation. Started Cortef  50 mg in the morning and 25 mg in the evening, started tapering dose over 5 days. Hopefully patient will not need more steroids and blood pressure will improve in few days. 9/2 blood pressure is improving, RN was advised to hold off midodrine  and Florinef  if patient does not needed, follow parameters 9/3 BP is fluctuating and pt was unable to walk in the morning while working with PT  9/16 orthostatic BPs improving, still blood pressure drops but patient is not symptomatic.  Able to ambulate, without any risk of fall.   # Weakness and fall  --Fatigue and weakness have been progressive, due to recent hospitalization and decline in health.  CT head wnl, no acute finding. Noted to have elevated BUN (unclear of time line regarding rise in BUN). Also noted to have significant orthostatic hypotension. Suspect this is likely cause of fall.  --PT/OT recommending SNF 8/27 acetylcholine antibodies negative and MuSK antibodies negative, No autoimmune disorder.    # ESRD on HD TTS Secondary  hyperparathyroidism Calcitriol  deficiency  --recently started on dialysis --HD per nephro   # Normocytic anemia, mild  Likely anemia of chronic disease  Stable.  Iron panel within normal limits.  B12 within normal limits.  Folate within normal limits -ESA per nephrology     Latest Ref Rng & Units 05/17/2024    8:30 AM 05/15/2024    8:00 AM 05/12/2024    8:00 AM  CBC   WBC 4.0 - 10.5 K/uL 5.8  5.3  7.8   Hemoglobin 13.0 - 17.0 g/dL 9.6  8.8  8.9   Hematocrit 39.0 - 52.0 % 29.3  28.1  28.3   Platelets 150 - 400 K/uL 316  261  231    # Hx of Afib --pt not taking anticoagulation PTA.  Currently in sinus. --hold home Toprol  due to low BP   # Depression and hx of panic disorder # Passive SI --psych consulted --no need for SI precaution, per psych --cont home bupropion  and Effexor   # Vitamin D  deficiency: Continue Calcitrol    # OSA --CPAP nightly   # Obesity, Class III, BMI 40-49.9 (morbid obesity)  Calorie restricted diet and daily exercise advised to lose body weight.  Lifestyle modification discussed.   Consults: Nephrology Vascular surgery  Procedures: 9/11 permacath exchange by vascular surgery   DVT prophylaxis: Heparin  SQ Code Status: Full code  Family Communication:  Level of care: Med-Surg Dispo:   The patient is from: home Anticipated d/c is to: SNF rehab, patient is currently unhomed as he was evicted recently. TOC consulted. Patient remains hospitalized given persistent severe orthostatic hypotension  Anticipated d/c date is: whenever TOC find a place for him 9/17 awaiting for group home placement. Applied for disability, and awaiting for financial support as patient has no income.   Subjective and Interval History:  Orthostatic this a.m.  States that he has been eating less over the last couple days as he does not have a taste for food.  He feels like food does not taste like he should.     Objective: Vitals:   05/17/24 2007 05/18/24 0254 05/18/24 0948 05/18/24 1700  BP: (!) 145/85 (!) 147/92 135/80 129/81  Pulse: 87 79 93 75  Resp: 18 18 20 19   Temp: 98.9 F (37.2 C) 97.9 F (36.6 C) 97.9 F (36.6 C) 98.2 F (36.8 C)  TempSrc:    Oral  SpO2: 94% 93% 96% 96%  Weight:      Height:        Intake/Output Summary (Last 24 hours) at 05/18/2024 1908 Last data filed at 05/18/2024 0900 Gross per 24 hour  Intake  240 ml  Output --  Net 240 ml    Filed Weights   05/15/24 1140 05/17/24 0800 05/17/24 1347  Weight: 119.1 kg 118 kg 117.3 kg    Examination:     Constitutional: In no distress.  Cardiovascular: Normal rate, regular rhythm. No lower extremity edema  Pulmonary: Non labored breathing on room air, no wheezing or rales.   Abdominal: Soft. Non distended and non tender Musculoskeletal: Normal range of motion.     Neurological: Alert and oriented to person, place, and time. Non focal  Skin: Skin is warm and dry.    Data Reviewed: I have personally reviewed labs and imaging studies   Time spent: 25 minutes  Alban Pepper, MD [facility] If 7PM-7AM, please contact night-coverage 05/18/2024, 7:08 PM

## 2024-05-18 NOTE — Plan of Care (Signed)

## 2024-05-19 ENCOUNTER — Encounter: Payer: Self-pay | Admitting: Student

## 2024-05-19 DIAGNOSIS — R531 Weakness: Secondary | ICD-10-CM | POA: Diagnosis not present

## 2024-05-19 LAB — RENAL FUNCTION PANEL
Albumin: 3.3 g/dL — ABNORMAL LOW (ref 3.5–5.0)
Anion gap: 13 (ref 5–15)
BUN: 17 mg/dL (ref 6–20)
CO2: 26 mmol/L (ref 22–32)
Calcium: 8.8 mg/dL — ABNORMAL LOW (ref 8.9–10.3)
Chloride: 97 mmol/L — ABNORMAL LOW (ref 98–111)
Creatinine, Ser: 8.17 mg/dL — ABNORMAL HIGH (ref 0.61–1.24)
GFR, Estimated: 7 mL/min — ABNORMAL LOW (ref >60)
Glucose, Bld: 142 mg/dL — ABNORMAL HIGH (ref 70–99)
Phosphorus: 3.6 mg/dL (ref 2.5–4.6)
Potassium: 3.3 mmol/L — ABNORMAL LOW (ref 3.5–5.1)
Sodium: 136 mmol/L (ref 135–145)

## 2024-05-19 LAB — CBC
HCT: 29.2 % — ABNORMAL LOW (ref 39.0–52.0)
Hemoglobin: 9.4 g/dL — ABNORMAL LOW (ref 13.0–17.0)
MCH: 31.4 pg (ref 26.0–34.0)
MCHC: 32.2 g/dL (ref 30.0–36.0)
MCV: 97.7 fL (ref 80.0–100.0)
Platelets: 313 K/uL (ref 150–400)
RBC: 2.99 MIL/uL — ABNORMAL LOW (ref 4.22–5.81)
RDW: 14.6 % (ref 11.5–15.5)
WBC: 6 K/uL (ref 4.0–10.5)
nRBC: 0 % (ref 0.0–0.2)

## 2024-05-19 LAB — PHOSPHORUS: Phosphorus: 3.7 mg/dL (ref 2.5–4.6)

## 2024-05-19 LAB — MAGNESIUM: Magnesium: 2 mg/dL (ref 1.7–2.4)

## 2024-05-19 MED ORDER — HEPARIN SODIUM (PORCINE) 1000 UNIT/ML IJ SOLN
INTRAMUSCULAR | Status: AC
  Filled 2024-05-19: qty 8

## 2024-05-19 MED ORDER — HEPARIN SODIUM (PORCINE) 1000 UNIT/ML DIALYSIS
25.0000 [IU]/kg | INTRAMUSCULAR | Status: DC | PRN
  Administered 2024-05-19: 2900 [IU] via INTRAVENOUS_CENTRAL

## 2024-05-19 MED ORDER — EPOETIN ALFA-EPBX 4000 UNIT/ML IJ SOLN
INTRAMUSCULAR | Status: AC
  Filled 2024-05-19: qty 1

## 2024-05-19 MED ORDER — ALTEPLASE 2 MG IJ SOLR: 2.0000 mg | Freq: Once | INTRAMUSCULAR | Status: DC | PRN

## 2024-05-19 MED ORDER — HEPARIN SODIUM (PORCINE) 1000 UNIT/ML DIALYSIS: 1000.0000 [IU] | INTRAMUSCULAR | Status: DC | PRN

## 2024-05-19 MED ORDER — INFLUENZA VIRUS VACC SPLIT PF (FLUZONE) 0.5 ML IM SUSY
0.5000 mL | PREFILLED_SYRINGE | INTRAMUSCULAR | Status: DC
Start: 2024-05-20 — End: 2024-06-19

## 2024-05-19 NOTE — Progress Notes (Signed)
 PROGRESS NOTE    Brady Edwards  FMW:[id number] DOB: 07-04-1966 DOA: 04/15/2024 PCP: Vicci Duwaine SQUIBB, DO  147A/[id number]-AA  LOS: 30 days   Brief hospital course:   Brady Edwards is a 58 y.o. male with medical history significant of newly ESRD recently started on HD, atrial fibrillation, anxiety and depression, morbid obesity who presented with complaints of weakness and falls.   Pt was very frustrated with his medical and mental problems, and wanted us  to make him better.  Pt complained of fatigue, lack of energy, and weakness, causing to fall several times since he was discharged from hospital on 04/13/24.  No focal neuro deficits.  Pt said he has had long standing mental health issues and needs to see psych.  Pt complained of constipation for the past 2 months that wasn't addressed during his last hospitalization.     Of note, pt was hospitalized from 04/07/24 to 04/13/24 during which he was started on dialysis, and was discharged after dialysis on 8/15 to continue outpatient dialysis TTS.   ED Course: initial vitals: afebrile, pulse 105, BP 86/65, RR 12, sating 99% on room air.  Labs consistent with ESRD.  CXR no acute finding.  EKG showed sinus tachycardia.  CT head no acute finding.  ED provider reported pt said he would be better off not living at this point but does not have a plan for suicide and is going to receive help.  Psych consult placed.  ED provider contacted oncall nephro, who plans dialysis for pt tomorrow.  Assessment & Plan:  Brady Edwards is a 58 y.o. male with medical history significant of recently diagnosed ESRD now on HD, atrial fibrillation, anxiety and depression, morbid obesity who presented with complaints of weakness and falls.    # Orthostatic Hypotension Persistent, symptomatic when going from seated to standing position. No concern for infection. No known neurological conditions that could be contributing. AM cortisol WNL though drawn at 0500.  -- Continue  to hold home Toprol  due to low BP --Continue midodrine  10 mg TID --TED/compression hose abd binder  -- Increase florinef  0.2mg  every day --Fluid intake liberalized per nephro -- Florinef  and midodrine  remain ineffective need to consider alternative agent 8/28 stared steroids, Solu-Medrol  125 mg IV daily for 3 days, PPI for GI prophylaxis and insulin  sliding scale monitor CBG for hyperglycemia 8/30 patient felt improvement with the steroids, still has orthostasis, BP drops at standing for 3 minutes.  But no problem with ambulation. Started Cortef  50 mg in the morning and 25 mg in the evening, started tapering dose over 5 days. Hopefully patient will not need more steroids and blood pressure will improve in few days. 9/2 blood pressure is improving, RN was advised to hold off midodrine  and Florinef  if patient does not needed, follow parameters 9/3 BP is fluctuating and pt was unable to walk in the morning while working with PT  9/16 orthostatic BPs improving, still blood pressure drops but patient is not symptomatic.  Able to ambulate, without any risk of fall. 9/19 orthostatic vitals worsened again in settin gof decreased PO intake  Will repeat orthostatic vitals in the AM  # Weakness and fall  --Fatigue and weakness have been progressive, due to recent hospitalization and decline in health.  CT head wnl, no acute finding. Noted to have elevated BUN (unclear of time line regarding rise in BUN). Also noted to have significant orthostatic hypotension. Suspect this is likely cause of fall.  --PT/OT recommending SNF 8/27 acetylcholine  antibodies negative and MuSK antibodies negative, No autoimmune disorder.    # ESRD on HD TTS Secondary hyperparathyroidism Calcitriol  deficiency  --recently started on dialysis --HD per nephro   # Normocytic anemia, mild  Likely anemia of chronic disease  Stable.  Iron panel within normal limits.  B12 within normal limits.  Folate within normal limits -ESA  per nephrology     Latest Ref Rng & Units 05/19/2024    8:42 AM 05/17/2024    8:30 AM 05/15/2024    8:00 AM  CBC  WBC 4.0 - 10.5 K/uL 6.0  5.8  5.3   Hemoglobin 13.0 - 17.0 g/dL 9.4  9.6  8.8   Hematocrit 39.0 - 52.0 % 29.2  29.3  28.1   Platelets 150 - 400 K/uL 313  316  261    # Hx of Afib --pt not taking anticoagulation PTA.  Currently in sinus. --hold home Toprol  due to low BP   # Depression and hx of panic disorder # Passive SI --psych consulted --no need for SI precaution, per psych --cont home bupropion  and Effexor   # Vitamin D  deficiency: Continue Calcitrol  #hypokalemia  Per nephrology, has hd this AM  # OSA --CPAP nightly   # Obesity, Class III, BMI 40-49.9 (morbid obesity)  Calorie restricted diet and daily exercise advised to lose body weight.  Lifestyle modification discussed.   Consults: Nephrology Vascular surgery  Procedures: 9/11 permacath exchange by vascular surgery   DVT prophylaxis: Heparin  SQ Code Status: Full code  Family Communication:  Level of care: Med-Surg Dispo:   The patient is from: home Anticipated d/c is to: SNF rehab, patient is currently unhomed as he was evicted recently. TOC consulted. Patient remains hospitalized given persistent severe orthostatic hypotension  Anticipated d/c date is: whenever TOC find a place for him 9/17 awaiting for group home placement. Applied for disability, and awaiting for financial support as patient has no income.   Subjective and Interval History:  NO issues overnight.      Objective: Vitals:   05/19/24 1130 05/19/24 1200 05/19/24 1207 05/19/24 1641  BP: (!) 130/96 (!) 141/99 (!) 139/91 (!) 147/94  Pulse: 78 77 87 79  Resp: 17 17 17 16   Temp:   98.2 F (36.8 C) 98.4 F (36.9 C)  TempSrc:   Oral   SpO2: 96% 99% 98% 95%  Weight:   116.5 kg   Height:        Intake/Output Summary (Last 24 hours) at 05/19/2024 1835 Last data filed at 05/19/2024 1207 Gross per 24 hour  Intake --   Output 0 ml  Net 0 ml    Filed Weights   05/17/24 1347 05/19/24 0819 05/19/24 1207  Weight: 117.3 kg 116.4 kg 116.5 kg    Examination:     Constitutional: In no distress.  Cardiovascular: Normal rate, regular rhythm. No lower extremity edema  Pulmonary: Non labored breathing on room air, no wheezing or rales.   Abdominal: Soft. Non distended and non tender Musculoskeletal: Normal range of motion.     Neurological: Alert and oriented to person, place, and time. Non focal  Skin: Skin is warm and dry.    Data Reviewed: I have personally reviewed labs and imaging studies   Time spent: 25 minutes  Alban Pepper, MD [facility] If 7PM-7AM, please contact night-coverage 05/19/2024, 6:35 PM

## 2024-05-19 NOTE — Progress Notes (Signed)
  Received patient in bed to unit.   Informed consent signed and in chart.    TX duration: 3.14hrs Clotted dialyzer and chamber  rinsed back 16  min early  NP made aware   Transported back to floor  Hand-off given to patient's nurse. No c/o and no acute distress noted    Access used: R HD Catheter  dressing changed  Access issues: none   Total UF removed: none Medication(s) given: retacrit  Post HD VS: wnl Post HD weight: 116.5kg     Olivia Hurst LPN [facility]

## 2024-05-19 NOTE — Progress Notes (Signed)
 [facility]  ROUNDING NOTE   Subjective:  Patient seen while on dialysis. Tolerating treatment, no complaints to offer. Hemodialysis dialysis treatment flowsheet  Blood flow rate (mL/min): 320 Arterial pressures (mmHg): -159.99 Venous pressures (mmHg): 110.09 TMP (mmHg): 12.32 Ultrafiltration rate (mL/min): 392 Dialysate (mL/min): 299  Objective:  Vital signs in last 24 hours:  Temp:  [97.9 F (36.6 C)-98.6 F (37 C)] 97.9 F (36.6 C) (09/20 0819) Pulse Rate:  [73-104] 78 (09/20 1130) Resp:  [13-20] 17 (09/20 1130) BP: (101-148)/(58-106) 130/96 (09/20 1130) SpO2:  [94 %-100 %] 96 % (09/20 1130) Weight:  [116.4 kg] 116.4 kg (09/20 0819)  Weight change:  Filed Weights   05/17/24 0800 05/17/24 1347 05/19/24 0819  Weight: 118 kg 117.3 kg 116.4 kg    Intake/Output: I/O last 3 completed shifts: In: 240 [P.O.:240] Out: -    Intake/Output this shift:  No intake/output data recorded.  Physical Exam: General: NAD  Head: Normocephalic  Eyes: Anicteric  Neck: Supple  Lungs:  Clear   Heart: Regular   Abdomen:  Soft  Extremities:  trace peripheral edema.  Neurologic: Alert  Skin: No lesions  Access: Rt chest permcath    Basic Metabolic Panel: Recent Labs  Lab 05/15/24 0800 05/17/24 0830 05/19/24 0842  NA 136 134* 136  K 3.6 3.5 3.3*  CL 98 95* 97*  CO2 24 28 26   GLUCOSE 87 87 142*  BUN 21* 15 17  CREATININE 9.87* 8.06* 8.17*  CALCIUM  9.0 8.9 8.8*  MG 2.3 2.1 2.0  PHOS 5.8* 4.7*  5.0* 3.7  3.6    Liver Function Tests: Recent Labs  Lab 05/17/24 0830 05/19/24 0842  ALBUMIN  3.2* 3.3*   No results for input(s): LIPASE, AMYLASE in the last 168 hours. No results for input(s): AMMONIA in the last 168 hours.  CBC: Recent Labs  Lab 05/15/24 0800 05/17/24 0830 05/19/24 0842  WBC 5.3 5.8 6.0  HGB 8.8* 9.6* 9.4*  HCT 28.1* 29.3* 29.2*  MCV 97.9 97.3 97.7  PLT 261 316 313    Cardiac Enzymes: No results for input(s): CKTOTAL,  CKMB, CKMBINDEX, TROPONINI in the last 168 hours.  BNP: Invalid input(s): POCBNP  CBG: Recent Labs  Lab 05/14/24 1959 05/15/24 1226 05/15/24 1633 05/15/24 2123 05/16/24 0759  GLUCAP 141* 90 87 92 81    Microbiology: Results for orders placed or performed during the hospital encounter of 04/07/24  Resp panel by RT-PCR (RSV, Flu A&B, Covid) Anterior Nasal Swab     Status: None   Collection Time: 04/07/24  4:46 PM   Specimen: Anterior Nasal Swab  Result Value Ref Range Status   SARS Coronavirus 2 by RT PCR NEGATIVE NEGATIVE Final    Comment: (NOTE) SARS-CoV-2 target nucleic acids are NOT DETECTED.  The SARS-CoV-2 RNA is generally detectable in upper respiratory specimens during the acute phase of infection. The lowest concentration of SARS-CoV-2 viral copies this assay can detect is 138 copies/mL. A negative result does not preclude SARS-Cov-2 infection and should not be used as the sole basis for treatment or other patient management decisions. A negative result may occur with  improper specimen collection/handling, submission of specimen other than nasopharyngeal swab, presence of viral mutation(s) within the areas targeted by this assay, and inadequate number of viral copies(<138 copies/mL). A negative result must be combined with clinical observations, patient history, and epidemiological information. The expected result is Negative.  Fact Sheet for Patients:  BloggerCourse.com  Fact Sheet for Healthcare Providers:  SeriousBroker.it  This test  is no t yet approved or cleared by the [place]  FDA and  has been authorized for detection and/or diagnosis of SARS-CoV-2 by FDA under an Emergency Use Authorization (EUA). This EUA will remain  in effect (meaning this test can be used) for the duration of the COVID-19 declaration under Section 564(b)(1) of the Act, 21 U.S.C.section 360bbb-3(b)(1), unless the  authorization is terminated  or revoked sooner.       Influenza A by PCR NEGATIVE NEGATIVE Final   Influenza B by PCR NEGATIVE NEGATIVE Final    Comment: (NOTE) The Xpert Xpress SARS-CoV-2/FLU/RSV plus assay is intended as an aid in the diagnosis of influenza from Nasopharyngeal swab specimens and should not be used as a sole basis for treatment. Nasal washings and aspirates are unacceptable for Xpert Xpress SARS-CoV-2/FLU/RSV testing.  Fact Sheet for Patients: BloggerCourse.com  Fact Sheet for Healthcare Providers: SeriousBroker.it  This test is not yet approved or cleared by the [place]  FDA and has been authorized for detection and/or diagnosis of SARS-CoV-2 by FDA under an Emergency Use Authorization (EUA). This EUA will remain in effect (meaning this test can be used) for the duration of the COVID-19 declaration under Section 564(b)(1) of the Act, 21 U.S.C. section 360bbb-3(b)(1), unless the authorization is terminated or revoked.     Resp Syncytial Virus by PCR NEGATIVE NEGATIVE Final    Comment: (NOTE) Fact Sheet for Patients: BloggerCourse.com  Fact Sheet for Healthcare Providers: SeriousBroker.it  This test is not yet approved or cleared by the [place]  FDA and has been authorized for detection and/or diagnosis of SARS-CoV-2 by FDA under an Emergency Use Authorization (EUA). This EUA will remain in effect (meaning this test can be used) for the duration of the COVID-19 declaration under Section 564(b)(1) of the Act, 21 U.S.C. section 360bbb-3(b)(1), unless the authorization is terminated or revoked.  Performed at [facility], [address] [place], [place] [address]     Coagulation Studies: No results for input(s): LABPROT, INR in the last 72 hours.  Urinalysis: No results for input(s): COLORURINE, LABSPEC, PHURINE,  GLUCOSEU, HGBUR, BILIRUBINUR, KETONESUR, PROTEINUR, UROBILINOGEN, NITRITE, LEUKOCYTESUR in the last 72 hours.  Invalid input(s): APPERANCEUR    Imaging: No results found.   Medications:     bisacodyl   10 mg Oral QHS   buPROPion   300 mg Oral Daily   calcitRIOL   0.25 mcg Oral Daily   Chlorhexidine  Gluconate Cloth  6 each Topical Daily   epoetin  alfa-epbx (RETACRIT ) injection  4,000 Units Intravenous Q T,Th,Sat-1800   fludrocortisone   0.4 mg Oral Daily   heparin   5,000 Units Subcutaneous Q8H   heparin  sodium (porcine)  4,200 Units Intracatheter Once   midodrine   10 mg Oral TID WC   polyethylene glycol  17 g Oral BID   sevelamer  carbonate  1,600 mg Oral TID WC   venlafaxine  XR  150 mg Oral Q breakfast   And   venlafaxine  XR  75 mg Oral Q breakfast   acetaminophen , alteplase , bisacodyl , butalbital -acetaminophen -caffeine , heparin , heparin , ondansetron  (ZOFRAN ) IV, ondansetron   Assessment/ Plan:  Brady Edwards is a 58 y.o.  male  with a PMHx of hypertension, atrial fibrillation, BPH, hyperlipidemia, anxiety, constipation, morbid obesity, and end stage renal disease on hemodialysis.    [facility]- will need to resubmit for chair at discharge.    End stage renal disease on hemodialysis       Appreciate vascular performing permcath exchange on 9/11. Receiving dialysis today.  Next treatment scheduled  for Tuesday.   2.  Hypokalemia Potassium 3.3 today.  Corrected with dialysis.   3.  Anemia of chronic kidney disease. Hgb 9.4. Will continue retacrit  4000 units with dialysis.      4.  Secondary hyperparathyroidism. PTH 199 during recent admission. Continue Renvela  2 tabs TID.              Calcium  and phos within optimal range   5. Severe orthostatic hypotension Workup so far has included 2D echo from 04/11/2024 which shows LVEF 65 to 70%, normal LV systolic function, mild concentric LVH, normal diastolic parameters, normal right ventricular  systolic function.  A.m. cortisol level normal from 04/24/2024.  TSH normal.  Continue TEDs,and abd binders.  Also continued on midodrine  10 mg 3 times a day along with Florinef  to 0.4mg  daily for blood pressure support. Symptoms have improved, able to ambulate in room        LOS: 30 Rossana Molchan P Levorn 9/20/202512:05 PM

## 2024-05-19 NOTE — Progress Notes (Signed)
 Patient returned from dialysis via bed with transport team. Stable condition noted.

## 2024-05-19 NOTE — Progress Notes (Signed)
 Mobility Specialist - Progress Note     05/19/24 1610  Orthostatic Lying   BP- Lying (!) 154/99  Orthostatic Sitting  BP- Sitting 120/90  Orthostatic Standing at 0 minutes  BP- Standing at 0 minutes 106/73  Orthostatic Standing at 3 minutes  BP- Standing at 3 minutes 104/77  Mobility  Activity Ambulated with assistance;Stood at bedside  Level of Assistance Standby assist, set-up cues, supervision of patient - no hands on  Assistive Device None  Distance Ambulated (ft) 360 ft  Range of Motion/Exercises Active  Activity Response Tolerated well  Mobility Referral Yes  Mobility visit 1 Mobility  Mobility Specialist Start Time (ACUTE ONLY) 1556  Mobility Specialist Stop Time (ACUTE ONLY) 1615  Mobility Specialist Time Calculation (min) (ACUTE ONLY) 19 min   Pt resting in bed on RA upon entry. Pt donned ted hose and wrap assisted by MS SBA with no AD. Pt ambulates to hallway around NS for two laps and returned to bed. Pt left with needs in reach.   Guido Rumble Mobility Specialist 05/19/24, 4:32 PM

## 2024-05-19 NOTE — Progress Notes (Signed)
 Patient to dialysis via bed with transport in stable condition.

## 2024-05-20 DIAGNOSIS — R531 Weakness: Secondary | ICD-10-CM | POA: Diagnosis not present

## 2024-05-20 DIAGNOSIS — I951 Orthostatic hypotension: Secondary | ICD-10-CM | POA: Diagnosis not present

## 2024-05-20 LAB — RENAL FUNCTION PANEL
Albumin: 3.4 g/dL — ABNORMAL LOW (ref 3.5–5.0)
Anion gap: 11 (ref 5–15)
BUN: 14 mg/dL (ref 6–20)
CO2: 28 mmol/L (ref 22–32)
Calcium: 9 mg/dL (ref 8.9–10.3)
Chloride: 98 mmol/L (ref 98–111)
Creatinine, Ser: 6.3 mg/dL — ABNORMAL HIGH (ref 0.61–1.24)
GFR, Estimated: 10 mL/min — ABNORMAL LOW (ref >60)
Glucose, Bld: 90 mg/dL (ref 70–99)
Phosphorus: 3.1 mg/dL (ref 2.5–4.6)
Potassium: 3.6 mmol/L (ref 3.5–5.1)
Sodium: 137 mmol/L (ref 135–145)

## 2024-05-20 NOTE — Progress Notes (Signed)
 PROGRESS NOTE    Brady Edwards  FMW:[id number] DOB: 1966/02/03 DOA: 04/15/2024 PCP: Vicci Duwaine SQUIBB, DO  147A/147A-AA  LOS: 31 days   Brief hospital course:   Brady Edwards is a 58 y.o. male with medical history significant of newly ESRD recently started on HD, atrial fibrillation, anxiety and depression, morbid obesity who presented with complaints of weakness and falls.   Pt was very frustrated with his medical and mental problems, and wanted us  to make him better.  Pt complained of fatigue, lack of energy, and weakness, causing to fall several times since he was discharged from hospital on 04/13/24.  No focal neuro deficits.  Pt said he has had long standing mental health issues and needs to see psych.  Pt complained of constipation for the past 2 months that wasn't addressed during his last hospitalization.     Of note, pt was hospitalized from 04/07/24 to 04/13/24 during which he was started on dialysis, and was discharged after dialysis on 8/15 to continue outpatient dialysis TTS.   ED Course: initial vitals: afebrile, pulse 105, BP 86/65, RR 12, sating 99% on room air.  Labs consistent with ESRD.  CXR no acute finding.  EKG showed sinus tachycardia.  CT head no acute finding.  ED provider reported pt said he would be better off not living at this point but does not have a plan for suicide and is going to receive help.  Psych consult placed.  ED provider contacted oncall nephro, who plans dialysis for pt tomorrow.  Assessment & Plan:  Brady Edwards is a 58 y.o. male with medical history significant of recently diagnosed ESRD now on HD, atrial fibrillation, anxiety and depression, morbid obesity who presented with complaints of weakness and falls.    # Orthostatic Hypotension Persistent, symptomatic when going from seated to standing position. No concern for infection. No known neurological conditions that could be contributing. AM cortisol WNL though drawn at 0500.  -- Continue  to hold home Toprol  due to low BP --Continue midodrine  10 mg TID --TED/compression hose abd binder  -- Increase florinef  0.2mg  every day --Fluid intake liberalized per nephro -- Florinef  and midodrine  remain ineffective need to consider alternative agent 8/28 stared steroids, Solu-Medrol  125 mg IV daily for 3 days, PPI for GI prophylaxis and insulin  sliding scale monitor CBG for hyperglycemia 8/30 patient felt improvement with the steroids, still has orthostasis, BP drops at standing for 3 minutes.  But no problem with ambulation. Started Cortef  50 mg in the morning and 25 mg in the evening, started tapering dose over 5 days. Hopefully patient will not need more steroids and blood pressure will improve in few days. 9/2 blood pressure is improving, RN was advised to hold off midodrine  and Florinef  if patient does not needed, follow parameters 9/3 BP is fluctuating and pt was unable to walk in the morning while working with PT  9/16 orthostatic BPs improving, still blood pressure drops but patient is not symptomatic.  Able to ambulate, without any risk of fall. 9/19 orthostatic vitals worsened again in settin gof decreased PO intake  Will repeat orthostatic vitals in the AM  # Weakness and fall  --Fatigue and weakness have been progressive, due to recent hospitalization and decline in health.  CT head wnl, no acute finding. Noted to have elevated BUN (unclear of time line regarding rise in BUN). Also noted to have significant orthostatic hypotension. Suspect this is likely cause of fall.  --PT/OT recommending SNF 8/27 acetylcholine  antibodies negative and MuSK antibodies negative, No autoimmune disorder.    # ESRD on HD TTS Secondary hyperparathyroidism Calcitriol  deficiency  --recently started on dialysis --HD per nephro   # Normocytic anemia, mild  Likely anemia of chronic disease  Stable.  Iron panel within normal limits.  B12 within normal limits.  Folate within normal limits -ESA  per nephrology     Latest Ref Rng & Units 05/19/2024    8:42 AM 05/17/2024    8:30 AM 05/15/2024    8:00 AM  CBC  WBC 4.0 - 10.5 K/uL 6.0  5.8  5.3   Hemoglobin 13.0 - 17.0 g/dL 9.4  9.6  8.8   Hematocrit 39.0 - 52.0 % 29.2  29.3  28.1   Platelets 150 - 400 K/uL 313  316  261    # Hx of Afib --pt not taking anticoagulation PTA.  Currently in sinus. --hold home Toprol  due to low BP   # Depression and hx of panic disorder # Passive SI --psych consulted --no need for SI precaution, per psych --cont home bupropion  and Effexor   # Vitamin D  deficiency: Continue Calcitrol  #hypokalemia  Per nephrology, has hd this AM  # OSA --CPAP nightly   # Obesity, Class III, BMI 40-49.9 (morbid obesity)  Calorie restricted diet and daily exercise advised to lose body weight.  Lifestyle modification discussed.   Consults: Nephrology Vascular surgery  Procedures: 9/11 permacath exchange by vascular surgery   DVT prophylaxis: Heparin  SQ Code Status: Full code  Family Communication:  Level of care: Med-Surg Dispo:   The patient is from: home Anticipated d/c is to: SNF rehab, patient is currently unhomed as he was evicted recently. TOC consulted. Patient remains hospitalized given persistent severe orthostatic hypotension  Anticipated d/c date is: whenever TOC find a place for him 9/17 awaiting for group home placement. Applied for disability, and awaiting for financial support as patient has no income.   Subjective and Interval History:  Able to stand this a.m. with no lightheadedness or dizziness.  Still has poor appetite.     Objective: Vitals:   05/20/24 0438 05/20/24 0721 05/20/24 1130 05/20/24 1540  BP: (!) 143/96 (!) 141/87 (!) 155/98 (!) 142/98  Pulse: 91 89 86 92  Resp: 18 18  16   Temp: 98.5 F (36.9 C) 98.5 F (36.9 C)  98.1 F (36.7 C)  TempSrc: Oral     SpO2: 98% 97%  98%  Weight:      Height:        Intake/Output Summary (Last 24 hours) at 05/20/2024  1751 Last data filed at 05/20/2024 0900 Gross per 24 hour  Intake 240 ml  Output --  Net 240 ml    Filed Weights   05/17/24 1347 05/19/24 0819 05/19/24 1207  Weight: 117.3 kg 116.4 kg 116.5 kg    Examination:      Constitutional: In no distress.  Cardiovascular: Normal rate, regular rhythm. No lower extremity edema  Pulmonary: Non labored breathing on room air, no wheezing or rales.   Abdominal: Soft. Non distended and non tender Musculoskeletal: Normal range of motion.     Neurological: Alert and oriented to person, place, and time. Non focal  Skin: Skin is warm and dry.     Data Reviewed: I have personally reviewed labs and imaging studies   Time spent: 25 minutes  Brady Pepper, MD [facility] If 7PM-7AM, please contact night-coverage 05/20/2024, 5:51 PM

## 2024-05-20 NOTE — Progress Notes (Signed)
 [facility]  ROUNDING NOTE   Subjective:  Patient seen and evaluated during rounds.  Patient laying supine on room air no signs of distress noted.  No reports of events overnight.  Patient encouraged to ambulate with binder and TED hose today.  Patient has no acute complaints.   Objective:  Vital signs in last 24 hours:  Temp:  [98.2 F (36.8 C)-98.5 F (36.9 C)] 98.5 F (36.9 C) (09/21 0721) Pulse Rate:  [73-91] 89 (09/21 0721) Resp:  [14-18] 18 (09/21 0721) BP: (130-149)/(87-99) 141/87 (09/21 0721) SpO2:  [95 %-99 %] 97 % (09/21 0721) Weight:  [116.5 kg] 116.5 kg (09/20 1207)  Weight change:  Filed Weights   05/17/24 1347 05/19/24 0819 05/19/24 1207  Weight: 117.3 kg 116.4 kg 116.5 kg    Intake/Output: No intake/output data recorded.   Intake/Output this shift:  Total I/O In: 240 [Edwards.O.:240] Out: -   Physical Exam: General: NAD  Head: Normocephalic  Eyes: Anicteric  Neck: Supple  Lungs:  Clear, Room air  Heart: Regular rate   Abdomen:  Soft  Extremities:  no peripheral edema.  Neurologic: Alert  Skin: In tact  Access: Rt chest permcath    Basic Metabolic Panel: Recent Labs  Lab 05/15/24 0800 05/17/24 0830 05/19/24 0842 05/20/24 0459  NA 136 134* 136 137  K 3.6 3.5 3.3* 3.6  CL 98 95* 97* 98  CO2 24 28 26 28   GLUCOSE 87 87 142* 90  BUN 21* 15 17 14   CREATININE 9.87* 8.06* 8.17* 6.30*  CALCIUM  9.0 8.9 8.8* 9.0  MG 2.3 2.1 2.0  --   PHOS 5.8* 4.7*  5.0* 3.7  3.6 3.1    Liver Function Tests: Recent Labs  Lab 05/17/24 0830 05/19/24 0842 05/20/24 0459  ALBUMIN  3.2* 3.3* 3.4*   No results for input(s): LIPASE, AMYLASE in the last 168 hours. No results for input(s): AMMONIA in the last 168 hours.  CBC: Recent Labs  Lab 05/15/24 0800 05/17/24 0830 05/19/24 0842  WBC 5.3 5.8 6.0  HGB 8.8* 9.6* 9.4*  HCT 28.1* 29.3* 29.2*  MCV 97.9 97.3 97.7  PLT 261 316 313    Cardiac Enzymes: No results for input(s): CKTOTAL,  CKMB, CKMBINDEX, TROPONINI in the last 168 hours.  BNP: Invalid input(s): POCBNP  CBG: Recent Labs  Lab 05/14/24 1959 05/15/24 1226 05/15/24 1633 05/15/24 2123 05/16/24 0759  GLUCAP 141* 90 87 92 81    Microbiology: Results for orders placed or performed during the hospital encounter of 04/07/24  Resp panel by RT-PCR (RSV, Flu A&B, Covid) Anterior Nasal Swab     Status: None   Collection Time: 04/07/24  4:46 PM   Specimen: Anterior Nasal Swab  Result Value Ref Range Status   SARS Coronavirus 2 by RT PCR NEGATIVE NEGATIVE Final    Comment: (NOTE) SARS-CoV-2 target nucleic acids are NOT DETECTED.  The SARS-CoV-2 RNA is generally detectable in upper respiratory specimens during the acute phase of infection. The lowest concentration of SARS-CoV-2 viral copies this assay can detect is 138 copies/mL. A negative result does not preclude SARS-Cov-2 infection and should not be used as the sole basis for treatment or other patient management decisions. A negative result may occur with  improper specimen collection/handling, submission of specimen other than nasopharyngeal swab, presence of viral mutation(s) within the areas targeted by this assay, and inadequate number of viral copies(<138 copies/mL). A negative result must be combined with clinical observations, patient history, and epidemiological information. The expected result is  Negative.  Fact Sheet for Patients:  BloggerCourse.com  Fact Sheet for Healthcare Providers:  SeriousBroker.it  This test is no t yet approved or cleared by the [place]  FDA and  has been authorized for detection and/or diagnosis of SARS-CoV-2 by FDA under an Emergency Use Authorization (EUA). This EUA will remain  in effect (meaning this test can be used) for the duration of the COVID-19 declaration under Section 564(b)(1) of the Act, 21 U.S.C.section 360bbb-3(b)(1), unless the  authorization is terminated  or revoked sooner.       Influenza A by PCR NEGATIVE NEGATIVE Final   Influenza B by PCR NEGATIVE NEGATIVE Final    Comment: (NOTE) The Xpert Xpress SARS-CoV-2/FLU/RSV plus assay is intended as an aid in the diagnosis of influenza from Nasopharyngeal swab specimens and should not be used as a sole basis for treatment. Nasal washings and aspirates are unacceptable for Xpert Xpress SARS-CoV-2/FLU/RSV testing.  Fact Sheet for Patients: BloggerCourse.com  Fact Sheet for Healthcare Providers: SeriousBroker.it  This test is not yet approved or cleared by the [place]  FDA and has been authorized for detection and/or diagnosis of SARS-CoV-2 by FDA under an Emergency Use Authorization (EUA). This EUA will remain in effect (meaning this test can be used) for the duration of the COVID-19 declaration under Section 564(b)(1) of the Act, 21 U.S.C. section 360bbb-3(b)(1), unless the authorization is terminated or revoked.     Resp Syncytial Virus by PCR NEGATIVE NEGATIVE Final    Comment: (NOTE) Fact Sheet for Patients: BloggerCourse.com  Fact Sheet for Healthcare Providers: SeriousBroker.it  This test is not yet approved or cleared by the [place]  FDA and has been authorized for detection and/or diagnosis of SARS-CoV-2 by FDA under an Emergency Use Authorization (EUA). This EUA will remain in effect (meaning this test can be used) for the duration of the COVID-19 declaration under Section 564(b)(1) of the Act, 21 U.S.C. section 360bbb-3(b)(1), unless the authorization is terminated or revoked.  Performed at [facility], [address] [place], [place] [address]     Coagulation Studies: No results for input(s): LABPROT, INR in the last 72 hours.  Urinalysis: No results for input(s): COLORURINE, LABSPEC, PHURINE,  GLUCOSEU, HGBUR, BILIRUBINUR, KETONESUR, PROTEINUR, UROBILINOGEN, NITRITE, LEUKOCYTESUR in the last 72 hours.  Invalid input(s): APPERANCEUR    Imaging: No results found.   Medications:     bisacodyl   10 mg Oral QHS   buPROPion   300 mg Oral Daily   calcitRIOL   0.25 mcg Oral Daily   Chlorhexidine  Gluconate Cloth  6 each Topical Daily   epoetin  alfa-epbx (RETACRIT ) injection  4,000 Units Intravenous Q T,Th,Sat-1800   fludrocortisone   0.4 mg Oral Daily   heparin   5,000 Units Subcutaneous Q8H   heparin  sodium (porcine)  4,200 Units Intracatheter Once   influenza vac split trivalent PF  0.5 mL Intramuscular Tomorrow-1000   midodrine   10 mg Oral TID WC   polyethylene glycol  17 g Oral BID   sevelamer  carbonate  1,600 mg Oral TID WC   venlafaxine  XR  150 mg Oral Q breakfast   And   venlafaxine  XR  75 mg Oral Q breakfast   acetaminophen , bisacodyl , butalbital -acetaminophen -caffeine , ondansetron  (ZOFRAN ) IV, ondansetron   Assessment/ Plan:  [name] is a 58 y.o.  male with a PMHx of hypertension, atrial fibrillation, BPH, hyperlipidemia, anxiety, constipation, morbid obesity, and end stage renal disease on hemodialysis.    [facility]- will need to resubmit for chair at discharge.  End stage renal disease on hemodialysis       Appreciate vascular performing permcath exchange on 9/11. Received dialysis yesterday. Next treatment scheduled for Tuesday. Continuing 0 UF for orthostatic hypotension.   2.  Hypokalemia Potassium 3.6 today.  Improved with dialysis   3.  Anemia of chronic kidney disease. Hgb 9.4. Continuing retacrit  4000 units with dialysis.      4.  Secondary hyperparathyroidism. PTH 199 during recent admission. Phos 3.1 today will continue Renvela  1 tabs TID.              Calcium  and phos within optimal range   5. Severe orthostatic hypotension Workup so far has included 2D echo from 04/11/2024 which shows LVEF 65 to 70%, normal  LV systolic function, mild concentric LVH, normal diastolic parameters, normal right ventricular systolic function.  A.m. cortisol level normal from 04/24/2024.  TSH normal.  Continue TEDs,and abd binders.  Will continue midodrine  10 mg 3 times a day along with Florinef  to 0.4mg  daily for blood pressure support. Patient reports able to ambulate in the room   LOS: 31 Brady Edwards Levorn 9/21/202510:12 AM

## 2024-05-21 DIAGNOSIS — I951 Orthostatic hypotension: Secondary | ICD-10-CM | POA: Diagnosis not present

## 2024-05-21 DIAGNOSIS — R531 Weakness: Secondary | ICD-10-CM | POA: Diagnosis not present

## 2024-05-21 NOTE — Plan of Care (Signed)

## 2024-05-21 NOTE — Progress Notes (Signed)
 PROGRESS NOTE    Brady Edwards  FMW:[id number] DOB: [birth year] DOA: 04/15/2024 PCP: Vicci Duwaine SQUIBB, DO  147A/147A-AA  LOS: 32 days   Brief hospital course:   Brady Edwards is a 58 y.o. male with medical history significant of newly ESRD recently started on HD, atrial fibrillation, anxiety and depression, morbid obesity who presented with complaints of weakness and falls.   Pt was very frustrated with his medical and mental problems, and wanted us  to make him better.  Pt complained of fatigue, lack of energy, and weakness, causing to fall several times since he was discharged from hospital on 04/13/24.  No focal neuro deficits.  Pt said he has had long standing mental health issues and needs to see psych.  Pt complained of constipation for the past 2 months that wasn't addressed during his last hospitalization.     Of note, pt was hospitalized from 04/07/24 to 04/13/24 during which he was started on dialysis, and was discharged after dialysis on 8/15 to continue outpatient dialysis TTS.   ED Course: initial vitals: afebrile, pulse 105, BP 86/65, RR 12, sating 99% on room air.  Labs consistent with ESRD.  CXR no acute finding.  EKG showed sinus tachycardia.  CT head no acute finding.  ED provider reported pt said he would be better off not living at this point but does not have a plan for suicide and is going to receive help.  Psych consult placed.  ED provider contacted oncall nephro, who plans dialysis for pt tomorrow.  Assessment & Plan:  Brady Edwards is a 58 y.o. male with medical history significant of recently diagnosed ESRD now on HD, atrial fibrillation, anxiety and depression, morbid obesity who presented with complaints of weakness and falls.    # Orthostatic Hypotension Persistent, symptomatic when going from seated to standing position. No concern for infection. No known neurological conditions that could be contributing. AM cortisol WNL though drawn at 0500.  -- Continue  to hold home Toprol  due to low BP --Continue midodrine  10 mg TID --TED/compression hose abd binder  -- Increase florinef  0.2mg  every day --Fluid intake liberalized per nephro -- Florinef  and midodrine  remain ineffective need to consider alternative agent 8/28 stared steroids, Solu-Medrol  125 mg IV daily for 3 days, PPI for GI prophylaxis and insulin  sliding scale monitor CBG for hyperglycemia 8/30 patient felt improvement with the steroids, still has orthostasis, BP drops at standing for 3 minutes.  But no problem with ambulation. Started Cortef  50 mg in the morning and 25 mg in the evening, started tapering dose over 5 days. Hopefully patient will not need more steroids and blood pressure will improve in few days. 9/2 blood pressure is improving, RN was advised to hold off midodrine  and Florinef  if patient does not needed, follow parameters 9/3 BP is fluctuating and pt was unable to walk in the morning while working with PT  9/16 orthostatic BPs improving, still blood pressure drops but patient is not symptomatic.  Able to ambulate, without any risk of fall. 9/19 orthostatic vitals worsened again in settin gof decreased PO intake  Will repeat orthostatic vitals in the AM  # Weakness and fall  --Fatigue and weakness have been progressive, due to recent hospitalization and decline in health.  CT head wnl, no acute finding. Noted to have elevated BUN (unclear of time line regarding rise in BUN). Also noted to have significant orthostatic hypotension. Suspect this is likely cause of fall.  --PT/OT recommending SNF 8/27 acetylcholine  antibodies negative and MuSK antibodies negative, No autoimmune disorder.    # ESRD on HD TTS Secondary hyperparathyroidism Calcitriol  deficiency  --recently started on dialysis --HD per nephro   # Normocytic anemia, mild  Likely anemia of chronic disease  Stable.  Iron panel within normal limits.  B12 within normal limits.  Folate within normal limits -ESA  per nephrology     Latest Ref Rng & Units 05/19/2024    8:42 AM 05/17/2024    8:30 AM 05/15/2024    8:00 AM  CBC  WBC 4.0 - 10.5 K/uL 6.0  5.8  5.3   Hemoglobin 13.0 - 17.0 g/dL 9.4  9.6  8.8   Hematocrit 39.0 - 52.0 % 29.2  29.3  28.1   Platelets 150 - 400 K/uL 313  316  261    # Hx of Afib --pt not taking anticoagulation PTA.  Currently in sinus. --hold home Toprol  due to low BP   # Depression and hx of panic disorder # Passive SI --psych consulted --no need for SI precaution, per psych --cont home bupropion  and Effexor   # Vitamin D  deficiency: Continue Calcitrol  #hypokalemia  Per nephrology, has hd this AM  # OSA --CPAP nightly   # Obesity, Class III, BMI 40-49.9 (morbid obesity)  Calorie restricted diet and daily exercise advised to lose body weight.  Lifestyle modification discussed.   Consults: Nephrology Vascular surgery  Procedures: 9/11 permacath exchange by vascular surgery   DVT prophylaxis: Heparin  SQ Code Status: Full code  Family Communication:  Level of care: Med-Surg Dispo:   The patient is from: home Anticipated d/c is to: SNF rehab, patient is currently unhomed as he was evicted recently. TOC consulted. Patient remains hospitalized given persistent severe orthostatic hypotension  Anticipated d/c date is: whenever TOC find a place for him 9/17 awaiting for group home placement. Applied for disability, and awaiting for financial support as patient has no income.   Subjective and Interval History:  Less orthostatic this a.m. continue to try and eat more     Objective: Vitals:   05/20/24 2113 05/21/24 0438 05/21/24 0825 05/21/24 1526  BP: (!) 141/96 (!) 140/62 (!) 145/93 (!) 138/92  Pulse: 85 84 94 91  Resp: 18 18 18 16   Temp: 98.6 F (37 C) 98.5 F (36.9 C) 98.1 F (36.7 C) 98 F (36.7 C)  TempSrc:   Oral Oral  SpO2: 98% 98% 94% 99%  Weight:      Height:        Intake/Output Summary (Last 24 hours) at 05/21/2024 1935 Last data  filed at 05/21/2024 1300 Gross per 24 hour  Intake 480 ml  Output --  Net 480 ml    Filed Weights   05/17/24 1347 05/19/24 0819 05/19/24 1207  Weight: 117.3 kg 116.4 kg 116.5 kg    Examination:      Constitutional: In no distress.  Cardiovascular: Normal rate, regular rhythm. No lower extremity edema  Pulmonary: Non labored breathing on room air, no wheezing or rales.   Abdominal: Soft. Non distended and non tender Musculoskeletal: Normal range of motion.     Neurological: Alert and oriented to person, place, and time. Non focal  Skin: Skin is warm and dry.      Data Reviewed: I have personally reviewed labs and imaging studies   Time spent: 25 minutes  Alban Pepper, MD [facility] If 7PM-7AM, please contact night-coverage 05/21/2024, 7:35 PM

## 2024-05-21 NOTE — Progress Notes (Signed)
 Mobility Specialist Progress Note:    05/21/24 1039  Orthostatic Lying   BP- Lying 147/90  Pulse- Lying 92  Orthostatic Sitting  BP- Sitting (!) 132/101  Pulse- Sitting 105  Orthostatic Standing at 0 minutes  BP- Standing at 0 minutes 118/60  Pulse- Standing at 0 minutes 108  Orthostatic Standing at 3 minutes  BP- Standing at 3 minutes 120/89  Pulse- Standing at 3 minutes 111  Mobility  Activity Ambulated with assistance  Level of Assistance Modified independent, requires aide device or extra time  Assistive Device None  Distance Ambulated (ft) 650 ft  Range of Motion/Exercises Active;All extremities  Activity Response Tolerated well  Mobility visit 1 Mobility  Mobility Specialist Start Time (ACUTE ONLY) 1031  Mobility Specialist Stop Time (ACUTE ONLY) 1059  Mobility Specialist Time Calculation (min) (ACUTE ONLY) 28 min   Ted hose, ace wraps, and abdominal binder were placed prior to mobility. Pt denies any symptoms during session. Modi to stand and ambulate with no AD. Returned to room, all needs met.   Sherrilee Ditty Mobility Specialist Please contact via Special educational needs teacher or  Rehab office at [phone]

## 2024-05-21 NOTE — Progress Notes (Signed)
 [facility]  ROUNDING NOTE   Subjective:   Resting quietly in bed Alert States dizziness has improved.  Able to ambulate   Objective:  Vital signs in last 24 hours:  Temp:  [98.1 F (36.7 C)-98.6 F (37 C)] 98.1 F (36.7 C) (09/22 0825) Pulse Rate:  [84-94] 94 (09/22 0825) Resp:  [16-18] 18 (09/22 0825) BP: (140-145)/(62-98) 145/93 (09/22 0825) SpO2:  [94 %-98 %] 94 % (09/22 0825)  Weight change:  Filed Weights   05/17/24 1347 05/19/24 0819 05/19/24 1207  Weight: 117.3 kg 116.4 kg 116.5 kg    Intake/Output: I/O last 3 completed shifts: In: 240 [P.O.:240] Out: -    Intake/Output this shift:  Total I/O In: 240 [P.O.:240] Out: -   Physical Exam: General: NAD  Head: Normocephalic  Eyes: Anicteric  Lungs:  Clear, Room air  Heart: Regular rate   Abdomen:  Soft  Extremities:  no peripheral edema.  Neurologic: Alert  Skin: In tact  Access: Rt chest permcath    Basic Metabolic Panel: Recent Labs  Lab 05/15/24 0800 05/17/24 0830 05/19/24 0842 05/20/24 0459  NA 136 134* 136 137  K 3.6 3.5 3.3* 3.6  CL 98 95* 97* 98  CO2 24 28 26 28   GLUCOSE 87 87 142* 90  BUN 21* 15 17 14   CREATININE 9.87* 8.06* 8.17* 6.30*  CALCIUM  9.0 8.9 8.8* 9.0  MG 2.3 2.1 2.0  --   PHOS 5.8* 4.7*  5.0* 3.7  3.6 3.1    Liver Function Tests: Recent Labs  Lab 05/17/24 0830 05/19/24 0842 05/20/24 0459  ALBUMIN  3.2* 3.3* 3.4*   No results for input(s): LIPASE, AMYLASE in the last 168 hours. No results for input(s): AMMONIA in the last 168 hours.  CBC: Recent Labs  Lab 05/15/24 0800 05/17/24 0830 05/19/24 0842  WBC 5.3 5.8 6.0  HGB 8.8* 9.6* 9.4*  HCT 28.1* 29.3* 29.2*  MCV 97.9 97.3 97.7  PLT 261 316 313    Cardiac Enzymes: No results for input(s): CKTOTAL, CKMB, CKMBINDEX, TROPONINI in the last 168 hours.  BNP: Invalid input(s): POCBNP  CBG: Recent Labs  Lab 05/14/24 1959 05/15/24 1226 05/15/24 1633 05/15/24 2123  05/16/24 0759  GLUCAP 141* 90 87 92 81    Microbiology: Results for orders placed or performed during the hospital encounter of 04/07/24  Resp panel by RT-PCR (RSV, Flu A&B, Covid) Anterior Nasal Swab     Status: None   Collection Time: 04/07/24  4:46 PM   Specimen: Anterior Nasal Swab  Result Value Ref Range Status   SARS Coronavirus 2 by RT PCR NEGATIVE NEGATIVE Final    Comment: (NOTE) SARS-CoV-2 target nucleic acids are NOT DETECTED.  The SARS-CoV-2 RNA is generally detectable in upper respiratory specimens during the acute phase of infection. The lowest concentration of SARS-CoV-2 viral copies this assay can detect is 138 copies/mL. A negative result does not preclude SARS-Cov-2 infection and should not be used as the sole basis for treatment or other patient management decisions. A negative result may occur with  improper specimen collection/handling, submission of specimen other than nasopharyngeal swab, presence of viral mutation(s) within the areas targeted by this assay, and inadequate number of viral copies(<138 copies/mL). A negative result must be combined with clinical observations, patient history, and epidemiological information. The expected result is Negative.  Fact Sheet for Patients:  BloggerCourse.com  Fact Sheet for Healthcare Providers:  SeriousBroker.it  This test is no t yet approved or cleared by the [place]  FDA  and  has been authorized for detection and/or diagnosis of SARS-CoV-2 by FDA under an Emergency Use Authorization (EUA). This EUA will remain  in effect (meaning this test can be used) for the duration of the COVID-19 declaration under Section 564(b)(1) of the Act, 21 U.S.C.section 360bbb-3(b)(1), unless the authorization is terminated  or revoked sooner.       Influenza A by PCR NEGATIVE NEGATIVE Final   Influenza B by PCR NEGATIVE NEGATIVE Final    Comment: (NOTE) The Xpert Xpress  SARS-CoV-2/FLU/RSV plus assay is intended as an aid in the diagnosis of influenza from Nasopharyngeal swab specimens and should not be used as a sole basis for treatment. Nasal washings and aspirates are unacceptable for Xpert Xpress SARS-CoV-2/FLU/RSV testing.  Fact Sheet for Patients: BloggerCourse.com  Fact Sheet for Healthcare Providers: SeriousBroker.it  This test is not yet approved or cleared by the [place]  FDA and has been authorized for detection and/or diagnosis of SARS-CoV-2 by FDA under an Emergency Use Authorization (EUA). This EUA will remain in effect (meaning this test can be used) for the duration of the COVID-19 declaration under Section 564(b)(1) of the Act, 21 U.S.C. section 360bbb-3(b)(1), unless the authorization is terminated or revoked.     Resp Syncytial Virus by PCR NEGATIVE NEGATIVE Final    Comment: (NOTE) Fact Sheet for Patients: BloggerCourse.com  Fact Sheet for Healthcare Providers: SeriousBroker.it  This test is not yet approved or cleared by the [place]  FDA and has been authorized for detection and/or diagnosis of SARS-CoV-2 by FDA under an Emergency Use Authorization (EUA). This EUA will remain in effect (meaning this test can be used) for the duration of the COVID-19 declaration under Section 564(b)(1) of the Act, 21 U.S.C. section 360bbb-3(b)(1), unless the authorization is terminated or revoked.  Performed at [facility], [address] [place], [place] [address]     Coagulation Studies: No results for input(s): LABPROT, INR in the last 72 hours.  Urinalysis: No results for input(s): COLORURINE, LABSPEC, PHURINE, GLUCOSEU, HGBUR, BILIRUBINUR, KETONESUR, PROTEINUR, UROBILINOGEN, NITRITE, LEUKOCYTESUR in the last 72 hours.  Invalid input(s): APPERANCEUR    Imaging: No results  found.   Medications:     bisacodyl   10 mg Oral QHS   buPROPion   300 mg Oral Daily   calcitRIOL   0.25 mcg Oral Daily   Chlorhexidine  Gluconate Cloth  6 each Topical Daily   epoetin  alfa-epbx (RETACRIT ) injection  4,000 Units Intravenous Q T,Th,Sat-1800   fludrocortisone   0.4 mg Oral Daily   heparin   5,000 Units Subcutaneous Q8H   heparin  sodium (porcine)  4,200 Units Intracatheter Once   influenza vac split trivalent PF  0.5 mL Intramuscular Tomorrow-1000   midodrine   10 mg Oral TID WC   polyethylene glycol  17 g Oral BID   sevelamer  carbonate  1,600 mg Oral TID WC   venlafaxine  XR  150 mg Oral Q breakfast   And   venlafaxine  XR  75 mg Oral Q breakfast   acetaminophen , bisacodyl , butalbital -acetaminophen -caffeine , ondansetron  (ZOFRAN ) IV, ondansetron   Assessment/ Plan:  [name] is a 58 y.o.  male with a PMHx of hypertension, atrial fibrillation, BPH, hyperlipidemia, anxiety, constipation, morbid obesity, and end stage renal disease on hemodialysis.    [facility]- will need to resubmit for chair at discharge.    End stage renal disease on hemodialysis       Appreciate vascular performing permcath exchange on 9/11. Next treatment scheduled for Tuesday.    2.  Hypokalemia  Potassium 3.6 at last check. Will continue to monitor and manage with dialysis.    3.  Anemia of chronic kidney disease. Hgb 9.4. Continuing retacrit  4000 units with dialysis.      4.  Secondary hyperparathyroidism. PTH 199 during recent admission. Phos 3.1 today will continue Renvela  1 tabs TID.              Bone minerals within optimal range.   5. Severe orthostatic hypotension Workup so far has included 2D echo from 04/11/2024 which shows LVEF 65 to 70%, normal LV systolic function, mild concentric LVH, normal diastolic parameters, normal right ventricular systolic function.  A.m. cortisol level normal from 04/24/2024.  TSH normal.  Continue TEDs,and abd binders.  Will continue  midodrine  10 mg 3 times a day along with Florinef  to 0.4mg  daily for blood pressure support. Patient reports able to ambulate in the room   LOS: 32 Brady Edwards 9/22/202511:43 AM

## 2024-05-22 DIAGNOSIS — I951 Orthostatic hypotension: Secondary | ICD-10-CM | POA: Diagnosis not present

## 2024-05-22 DIAGNOSIS — R531 Weakness: Secondary | ICD-10-CM | POA: Diagnosis not present

## 2024-05-22 LAB — MAGNESIUM: Magnesium: 2 mg/dL (ref 1.7–2.4)

## 2024-05-22 LAB — PHOSPHORUS: Phosphorus: 4.1 mg/dL (ref 2.5–4.6)

## 2024-05-22 MED ORDER — STERILE WATER FOR INJECTION IJ SOLN
INTRAMUSCULAR | Status: AC
  Filled 2024-05-22: qty 10

## 2024-05-22 MED ORDER — ALTEPLASE 2 MG IJ SOLR
INTRAMUSCULAR | Status: AC
  Filled 2024-05-22: qty 4

## 2024-05-22 MED ORDER — ALTEPLASE 2 MG IJ SOLR
2.0000 mg | Freq: Once | INTRAMUSCULAR | Status: AC | PRN
  Administered 2024-05-22: 2 mg

## 2024-05-22 MED ORDER — HEPARIN SODIUM (PORCINE) 1000 UNIT/ML IJ SOLN
INTRAMUSCULAR | Status: AC
Start: 2024-05-22 — End: 2024-05-22
  Filled 2024-05-22: qty 3

## 2024-05-22 MED ORDER — HEPARIN SODIUM (PORCINE) 1000 UNIT/ML IJ SOLN
INTRAMUSCULAR | Status: AC
  Filled 2024-05-22: qty 6

## 2024-05-22 MED ORDER — EPOETIN ALFA-EPBX 4000 UNIT/ML IJ SOLN
INTRAMUSCULAR | Status: AC
  Filled 2024-05-22: qty 1

## 2024-05-22 MED ORDER — HEPARIN SODIUM (PORCINE) 1000 UNIT/ML DIALYSIS: 1000.0000 [IU] | INTRAMUSCULAR | Status: DC | PRN

## 2024-05-22 MED ORDER — HEPARIN SODIUM (PORCINE) 1000 UNIT/ML IJ SOLN
INTRAMUSCULAR | Status: AC
  Filled 2024-05-22: qty 1

## 2024-05-22 NOTE — Progress Notes (Signed)
 [facility]  ROUNDING NOTE   Subjective:   Patient seen and evaluated during dialysis   HEMODIALYSIS FLOWSHEET:  Blood Flow Rate (mL/min): 300 mL/min Arterial Pressure (mmHg): -67.07 mmHg Venous Pressure (mmHg): -14.14 mmHg TMP (mmHg): 23.03 mmHg Ultrafiltration Rate (mL/min): 571 mL/min Dialysate Flow Rate (mL/min): 0 ml/min Dialysis Fluid Bolus: Normal Saline Bolus Amount (mL): 100 mL  Cathflo instilled in permcath due to malfunctioning access. HD staff to recheck after 1 hour swell.   Objective:  Vital signs in last 24 hours:  Temp:  [97.6 F (36.4 C)-98.4 F (36.9 C)] 97.6 F (36.4 C) (09/23 0856) Pulse Rate:  [80-98] 93 (09/23 1030) Resp:  [13-26] 17 (09/23 1030) BP: (116-165)/(89-110) 132/104 (09/23 1030) SpO2:  [92 %-100 %] 97 % (09/23 1030) Weight:  [115.1 kg] 115.1 kg (09/23 0856)  Weight change:  Filed Weights   05/19/24 0819 05/19/24 1207 05/22/24 0856  Weight: 116.4 kg 116.5 kg 115.1 kg    Intake/Output: I/O last 3 completed shifts: In: 480 [P.O.:480] Out: -    Intake/Output this shift:  No intake/output data recorded.  Physical Exam: General: NAD  Head: Normocephalic  Eyes: Anicteric  Lungs:  Clear, Room air  Heart: Regular rate   Abdomen:  Soft  Extremities:  no peripheral edema.  Neurologic: Alert  Skin: Intact  Access: Rt chest permcath    Basic Metabolic Panel: Recent Labs  Lab 05/17/24 0830 05/19/24 0842 05/20/24 0459 05/22/24 0738  NA 134* 136 137  --   K 3.5 3.3* 3.6  --   CL 95* 97* 98  --   CO2 28 26 28   --   GLUCOSE 87 142* 90  --   BUN 15 17 14   --   CREATININE 8.06* 8.17* 6.30*  --   CALCIUM  8.9 8.8* 9.0  --   MG 2.1 2.0  --  2.0  PHOS 4.7*  5.0* 3.7  3.6 3.1 4.1    Liver Function Tests: Recent Labs  Lab 05/17/24 0830 05/19/24 0842 05/20/24 0459  ALBUMIN  3.2* 3.3* 3.4*   No results for input(s): LIPASE, AMYLASE in the last 168 hours. No results for input(s): AMMONIA in the last 168  hours.  CBC: Recent Labs  Lab 05/17/24 0830 05/19/24 0842  WBC 5.8 6.0  HGB 9.6* 9.4*  HCT 29.3* 29.2*  MCV 97.3 97.7  PLT 316 313    Cardiac Enzymes: No results for input(s): CKTOTAL, CKMB, CKMBINDEX, TROPONINI in the last 168 hours.  BNP: Invalid input(s): POCBNP  CBG: Recent Labs  Lab 05/15/24 1226 05/15/24 1633 05/15/24 2123 05/16/24 0759  GLUCAP 90 87 92 81    Microbiology: Results for orders placed or performed during the hospital encounter of 04/07/24  Resp panel by RT-PCR (RSV, Flu A&B, Covid) Anterior Nasal Swab     Status: None   Collection Time: 04/07/24  4:46 PM   Specimen: Anterior Nasal Swab  Result Value Ref Range Status   SARS Coronavirus 2 by RT PCR NEGATIVE NEGATIVE Final    Comment: (NOTE) SARS-CoV-2 target nucleic acids are NOT DETECTED.  The SARS-CoV-2 RNA is generally detectable in upper respiratory specimens during the acute phase of infection. The lowest concentration of SARS-CoV-2 viral copies this assay can detect is 138 copies/mL. A negative result does not preclude SARS-Cov-2 infection and should not be used as the sole basis for treatment or other patient management decisions. A negative result may occur with  improper specimen collection/handling, submission of specimen other than nasopharyngeal swab, presence of  viral mutation(s) within the areas targeted by this assay, and inadequate number of viral copies(<138 copies/mL). A negative result must be combined with clinical observations, patient history, and epidemiological information. The expected result is Negative.  Fact Sheet for Patients:  BloggerCourse.com  Fact Sheet for Healthcare Providers:  SeriousBroker.it  This test is no t yet approved or cleared by the [place]  FDA and  has been authorized for detection and/or diagnosis of SARS-CoV-2 by FDA under an Emergency Use Authorization (EUA). This EUA will  remain  in effect (meaning this test can be used) for the duration of the COVID-19 declaration under Section 564(b)(1) of the Act, 21 U.S.C.section 360bbb-3(b)(1), unless the authorization is terminated  or revoked sooner.       Influenza A by PCR NEGATIVE NEGATIVE Final   Influenza B by PCR NEGATIVE NEGATIVE Final    Comment: (NOTE) The Xpert Xpress SARS-CoV-2/FLU/RSV plus assay is intended as an aid in the diagnosis of influenza from Nasopharyngeal swab specimens and should not be used as a sole basis for treatment. Nasal washings and aspirates are unacceptable for Xpert Xpress SARS-CoV-2/FLU/RSV testing.  Fact Sheet for Patients: BloggerCourse.com  Fact Sheet for Healthcare Providers: SeriousBroker.it  This test is not yet approved or cleared by the [place]  FDA and has been authorized for detection and/or diagnosis of SARS-CoV-2 by FDA under an Emergency Use Authorization (EUA). This EUA will remain in effect (meaning this test can be used) for the duration of the COVID-19 declaration under Section 564(b)(1) of the Act, 21 U.S.C. section 360bbb-3(b)(1), unless the authorization is terminated or revoked.     Resp Syncytial Virus by PCR NEGATIVE NEGATIVE Final    Comment: (NOTE) Fact Sheet for Patients: BloggerCourse.com  Fact Sheet for Healthcare Providers: SeriousBroker.it  This test is not yet approved or cleared by the [place]  FDA and has been authorized for detection and/or diagnosis of SARS-CoV-2 by FDA under an Emergency Use Authorization (EUA). This EUA will remain in effect (meaning this test can be used) for the duration of the COVID-19 declaration under Section 564(b)(1) of the Act, 21 U.S.C. section 360bbb-3(b)(1), unless the authorization is terminated or revoked.  Performed at [facility], [address] [place], [place] [address]      Coagulation Studies: No results for input(s): LABPROT, INR in the last 72 hours.  Urinalysis: No results for input(s): COLORURINE, LABSPEC, PHURINE, GLUCOSEU, HGBUR, BILIRUBINUR, KETONESUR, PROTEINUR, UROBILINOGEN, NITRITE, LEUKOCYTESUR in the last 72 hours.  Invalid input(s): APPERANCEUR    Imaging: No results found.   Medications:     bisacodyl   10 mg Oral QHS   buPROPion   300 mg Oral Daily   calcitRIOL   0.25 mcg Oral Daily   Chlorhexidine  Gluconate Cloth  6 each Topical Daily   epoetin  alfa-epbx (RETACRIT ) injection  4,000 Units Intravenous Q T,Th,Sat-1800   fludrocortisone   0.4 mg Oral Daily   heparin   5,000 Units Subcutaneous Q8H   influenza vac split trivalent PF  0.5 mL Intramuscular Tomorrow-1000   midodrine   10 mg Oral TID WC   polyethylene glycol  17 g Oral BID   sevelamer  carbonate  1,600 mg Oral TID WC   venlafaxine  XR  150 mg Oral Q breakfast   And   venlafaxine  XR  75 mg Oral Q breakfast   acetaminophen , bisacodyl , butalbital -acetaminophen -caffeine , heparin , ondansetron  (ZOFRAN ) IV, ondansetron   Assessment/ Plan:  [name] is a 58 y.o.  male with a PMHx of hypertension, atrial fibrillation, BPH, hyperlipidemia, anxiety, constipation, morbid  obesity, and end stage renal disease on hemodialysis.    [facility]- will need to resubmit for chair at discharge.    End stage renal disease on hemodialysis       Appreciate vascular performing permcath exchange on 9/11. Receiving dialysis today, UF goal 0-0.5L. Next treatment scheduled for Thursday.    2.  Hypokalemia Potassium 3.6 at last check. Will continue to monitor and manage with dialysis.    3.  Anemia of chronic kidney disease. Hgb 9.4. Continue retacrit  4000 units with dialysis.      4.  Secondary hyperparathyroidism. PTH 199 during recent admission. Phos 3.1 today will continue Renvela  1 tabs TID.              Phosphorus acceptable.    5. Severe  orthostatic hypotension Workup so far has included 2D echo from 04/11/2024 which shows LVEF 65 to 70%, normal LV systolic function, mild concentric LVH, normal diastolic parameters, normal right ventricular systolic function.  A.m. cortisol level normal from 04/24/2024.  TSH normal.  Continue TEDs,and abd binders.  Continue midodrine  10 mg 3 times a day along with Florinef  to 0.4mg  daily for blood pressure support. Patient reports able to ambulate in hall   LOS: [address] 9/23/202511:02 AM

## 2024-05-22 NOTE — Plan of Care (Signed)

## 2024-05-22 NOTE — Progress Notes (Signed)
 PROGRESS NOTE    HERMILO DUTTER  FMW:[id number] DOB: 24-May-1966 DOA: 04/15/2024 PCP: Vicci Duwaine SQUIBB, DO  147A/147A-AA  LOS: 33 days   Brief hospital course:   MONTE ZINNI is a 58 y.o. male with medical history significant of newly ESRD recently started on HD, atrial fibrillation, anxiety and depression, morbid obesity who presented with complaints of weakness and falls.   Pt was very frustrated with his medical and mental problems, and wanted us  to make him better.  Pt complained of fatigue, lack of energy, and weakness, causing to fall several times since he was discharged from hospital on 04/13/24.  No focal neuro deficits.  Pt said he has had long standing mental health issues and needs to see psych.  Pt complained of constipation for the past 2 months that wasn't addressed during his last hospitalization.     Of note, pt was hospitalized from 04/07/24 to 04/13/24 during which he was started on dialysis, and was discharged after dialysis on 8/15 to continue outpatient dialysis TTS.   ED Course: initial vitals: afebrile, pulse 105, BP 86/65, RR 12, sating 99% on room air.  Labs consistent with ESRD.  CXR no acute finding.  EKG showed sinus tachycardia.  CT head no acute finding.  ED provider reported pt said he would be better off not living at this point but does not have a plan for suicide and is going to receive help.  Psych consult placed.  ED provider contacted oncall nephro, who plans dialysis for pt tomorrow.  Assessment & Plan:  JAMESROBERT OHANESIAN is a 58 y.o. male with medical history significant of recently diagnosed ESRD now on HD, atrial fibrillation, anxiety and depression, morbid obesity who presented with complaints of weakness and falls.    # Orthostatic Hypotension Persistent, symptomatic when going from seated to standing position. No concern for infection. No known neurological conditions that could be contributing. AM cortisol WNL though drawn at 0500.  -- Continue  to hold home Toprol  due to low BP --Continue midodrine  10 mg TID --TED/compression hose abd binder  -- Increase florinef  0.2mg  every day --Fluid intake liberalized per nephro -- Florinef  and midodrine  remain ineffective need to consider alternative agent 8/28 stared steroids, Solu-Medrol  125 mg IV daily for 3 days, PPI for GI prophylaxis and insulin  sliding scale monitor CBG for hyperglycemia 8/30 patient felt improvement with the steroids, still has orthostasis, BP drops at standing for 3 minutes.  But no problem with ambulation. Started Cortef  50 mg in the morning and 25 mg in the evening, started tapering dose over 5 days. Hopefully patient will not need more steroids and blood pressure will improve in few days. 9/2 blood pressure is improving, RN was advised to hold off midodrine  and Florinef  if patient does not needed, follow parameters 9/3 BP is fluctuating and pt was unable to walk in the morning while working with PT  9/16 orthostatic BPs improving, still blood pressure drops but patient is not symptomatic.  Able to ambulate, without any risk of fall. 9/19 orthostatic vitals worsened again in settin gof decreased PO intake  Static vitals improved 9/22 Continue to monitor, continue current medications  # Weakness and fall  --Fatigue and weakness have been progressive, due to recent hospitalization and decline in health.  CT head wnl, no acute finding. Noted to have elevated BUN (unclear of time line regarding rise in BUN). Also noted to have significant orthostatic hypotension. Suspect this is likely cause of fall.  --PT/OT recommending  SNF 8/27 acetylcholine antibodies negative and MuSK antibodies negative, No autoimmune disorder.    # ESRD on HD TTS Secondary hyperparathyroidism Calcitriol  deficiency  --recently started on dialysis --HD per nephro   # Normocytic anemia, mild  Likely anemia of chronic disease  Stable.  Iron panel within normal limits.  B12 within normal  limits.  Folate within normal limits -ESA per nephrology     Latest Ref Rng & Units 05/19/2024    8:42 AM 05/17/2024    8:30 AM 05/15/2024    8:00 AM  CBC  WBC 4.0 - 10.5 K/uL 6.0  5.8  5.3   Hemoglobin 13.0 - 17.0 g/dL 9.4  9.6  8.8   Hematocrit 39.0 - 52.0 % 29.2  29.3  28.1   Platelets 150 - 400 K/uL 313  316  261    # Hx of Afib --pt not taking anticoagulation PTA.  Currently in sinus. --hold home Toprol  due to low BP   # Depression and hx of panic disorder # Passive SI --psych consulted --no need for SI precaution, per psych --cont home bupropion  and Effexor   # Vitamin D  deficiency: Continue Calcitrol  #hypokalemia  Per nephrology, has hd this AM  # OSA --CPAP nightly   # Obesity, Class III, BMI 40-49.9 (morbid obesity)  Calorie restricted diet and daily exercise advised to lose body weight.  Lifestyle modification discussed.   Consults: Nephrology Vascular surgery  Procedures: 9/11 permacath exchange by vascular surgery   DVT prophylaxis: Heparin  SQ Code Status: Full code  Family Communication:  Level of care: Med-Surg Dispo:   The patient is from: home Anticipated d/c is to: SNF rehab, patient is currently unhomed as he was evicted recently. TOC consulted. Patient remains hospitalized given persistent severe orthostatic hypotension  Anticipated d/c date is: whenever TOC find a place for him 9/17 awaiting for group home placement. Applied for disability, and awaiting for financial support as patient has no income.   Subjective and Interval History:  Less orthostatic this a.m. continue to try and eat more     Objective: Vitals:   05/22/24 1330 05/22/24 1400 05/22/24 1448 05/22/24 1557  BP: (!) 137/101 (!) 128/94 (!) 149/99   Pulse: 89 89 95   Resp: (!) 22 14 18    Temp:   99.4 F (37.4 C)   TempSrc:   Oral   SpO2: 99% 99% 100%   Weight:    115.1 kg  Height:        Intake/Output Summary (Last 24 hours) at 05/22/2024 1825 Last data filed at  05/22/2024 1700 Gross per 24 hour  Intake 100 ml  Output --  Net 100 ml    Filed Weights   05/19/24 1207 05/22/24 0856 05/22/24 1557  Weight: 116.5 kg 115.1 kg 115.1 kg    Examination:      Constitutional: In no distress.  Cardiovascular: Normal rate, regular rhythm. No lower extremity edema  Pulmonary: Non labored breathing on room air, no wheezing or rales.   Abdominal: Soft. Non distended and non tender Musculoskeletal: Normal range of motion.     Neurological: Alert and oriented to person, place, and time. Non focal  Skin: Skin is warm and dry.      Data Reviewed: I have personally reviewed labs and imaging studies   Time spent: 25 minutes  Alban Pepper, MD [facility] If 7PM-7AM, please contact night-coverage 05/22/2024, 6:25 PM

## 2024-05-22 NOTE — Progress Notes (Signed)
  Received patient in bed to unit.   Informed consent signed and in chart.    TX duration 3hrs  After 1 hr of tx, pt rinsed back and activase  Instilled to both ports.  Let sit for 1hr.  Push/pull good to both ports and hooked back up for tx.  When on machine, poor flow from catheter making machine alarm repeatedly.  Bfr dec throughout tx.  NP made aware of poorly functioning catheter.  Pt rinsed back 30 min early to deter possibility of clotting      Transported back, to floor  Hand-off given to patient's nurse. No c/o and no acute distress noted    Access used: R HD Catheter  Access issues: see above   Total UF removed: none Medication(s) given: retacrit  Post HD VS: wnl Post HD weight: 115.1kg     Olivia Hurst LPN [facility]

## 2024-05-23 DIAGNOSIS — D638 Anemia in other chronic diseases classified elsewhere: Secondary | ICD-10-CM | POA: Diagnosis not present

## 2024-05-23 DIAGNOSIS — N186 End stage renal disease: Secondary | ICD-10-CM

## 2024-05-23 DIAGNOSIS — E669 Obesity, unspecified: Secondary | ICD-10-CM | POA: Insufficient documentation

## 2024-05-23 DIAGNOSIS — I951 Orthostatic hypotension: Secondary | ICD-10-CM | POA: Diagnosis not present

## 2024-05-23 DIAGNOSIS — R531 Weakness: Secondary | ICD-10-CM | POA: Diagnosis not present

## 2024-05-23 LAB — RENAL FUNCTION PANEL
Albumin: 3.5 g/dL (ref 3.5–5.0)
Anion gap: 10 (ref 5–15)
BUN: 18 mg/dL (ref 6–20)
CO2: 26 mmol/L (ref 22–32)
Calcium: 9.3 mg/dL (ref 8.9–10.3)
Chloride: 101 mmol/L (ref 98–111)
Creatinine, Ser: 7.11 mg/dL — ABNORMAL HIGH (ref 0.61–1.24)
GFR, Estimated: 8 mL/min — ABNORMAL LOW (ref >60)
Glucose, Bld: 92 mg/dL (ref 70–99)
Phosphorus: 3.7 mg/dL (ref 2.5–4.6)
Potassium: 4.4 mmol/L (ref 3.5–5.1)
Sodium: 137 mmol/L (ref 135–145)

## 2024-05-23 MED ORDER — STERILE WATER FOR INJECTION IJ SOLN
INTRAMUSCULAR | Status: AC
  Filled 2024-05-23: qty 10

## 2024-05-23 MED ORDER — ALTEPLASE 2 MG IJ SOLR
2.0000 mg | Freq: Once | INTRAMUSCULAR | Status: AC | PRN
  Administered 2024-05-23: 3.2 mg

## 2024-05-23 MED ORDER — ALTEPLASE 2 MG IJ SOLR
INTRAMUSCULAR | Status: AC
  Filled 2024-05-23: qty 4

## 2024-05-23 NOTE — Assessment & Plan Note (Signed)
 Patient seen on hemodialysis today

## 2024-05-23 NOTE — Assessment & Plan Note (Signed)
Continue working with physical therapy

## 2024-05-23 NOTE — Progress Notes (Signed)
 Mobility Specialist Progress Note:    05/23/24 1002  Orthostatic Lying   BP- Lying 147/90  Pulse- Lying 103  Orthostatic Sitting  BP- Sitting 116/79  Pulse- Sitting 122  Orthostatic Standing at 0 minutes  BP- Standing at 0 minutes 94/72  Pulse- Standing at 0 minutes 130  Mobility  Activity Ambulated with assistance  Level of Assistance Modified independent, requires aide device or extra time  Assistive Device None  Distance Ambulated (ft) 350 ft  Range of Motion/Exercises Active;All extremities  Activity Response Tolerated well  Mobility visit 1 Mobility  Mobility Specialist Start Time (ACUTE ONLY) 0955  Mobility Specialist Stop Time (ACUTE ONLY) 1020  Mobility Specialist Time Calculation (min) (ACUTE ONLY) 25 min   Ted hose, ace wraps, and abdominal binder placed prior to mobility. Modi to stand and ambulate with no AD. Tolerated well, pt experiencing some weakness upon standing. Returned seated for rest break, upon standing again BP 107/82 and HR 114 bpm with symptoms subsiding. Asx throughout ambulation and VSS after. Returned pt supine, all needs met.  Sherrilee Ditty Mobility Specialist Please contact via Special educational needs teacher or  Rehab office at [phone]

## 2024-05-23 NOTE — Plan of Care (Signed)

## 2024-05-23 NOTE — Assessment & Plan Note (Addendum)
 Last hemoglobin 11.2.

## 2024-05-23 NOTE — Assessment & Plan Note (Addendum)
 Increased midodrine  3 times daily to 15 mg and on Florinef  0.3 mg daily.  A.m. cortisol was normal.  Patient still drops his blood pressure quite a bit when he stands up.  Continue TED hose.  Continue working with physical therapy

## 2024-05-23 NOTE — Assessment & Plan Note (Addendum)
-   BMI 37.78

## 2024-05-23 NOTE — Hospital Course (Addendum)
 58 y.o. male with medical history significant of newly ESRD recently started on HD, atrial fibrillation, anxiety and depression, morbid obesity who presented with complaints of weakness and falls. Of note, pt was hospitalized from 04/07/24 to 04/13/24 during which he was started on dialysis, and was discharged after dialysis on 8/15 to continue outpatient dialysis TTS. Patient was found has severe orthostatic hypotension, was started on high-dose midodrine .  Currently pending nursing home placement

## 2024-05-23 NOTE — Progress Notes (Signed)
 [facility]  ROUNDING NOTE   Subjective:   Patient seen resting in bed Continues to tolerate ambulation in hallway No complaints to offer today  Objective:  Vital signs in last 24 hours:  Temp:  [97.8 F (36.6 C)-99.4 F (37.4 C)] 97.9 F (36.6 C) (09/24 0847) Pulse Rate:  [61-130] 61 (09/24 0850) Resp:  [14-22] 18 (09/24 0227) BP: (113-153)/(78-104) 113/78 (09/24 0850) SpO2:  [91 %-100 %] 91 % (09/24 0850) Weight:  [115.1 kg] 115.1 kg (09/23 1557)  Weight change:  Filed Weights   05/19/24 1207 05/22/24 0856 05/22/24 1557  Weight: 116.5 kg 115.1 kg 115.1 kg    Intake/Output: I/O last 3 completed shifts: In: 100 [P.O.:100] Out: -    Intake/Output this shift:  Total I/O In: 240 [P.O.:240] Out: -   Physical Exam: General: NAD  Head: Normocephalic  Eyes: Anicteric  Lungs:  Clear, Room air  Heart: Regular rate   Abdomen:  Soft  Extremities:  no peripheral edema.  Neurologic: Alert  Skin: Intact  Access: Rt chest permcath    Basic Metabolic Panel: Recent Labs  Lab 05/17/24 0830 05/19/24 0842 05/20/24 0459 05/22/24 0738 05/23/24 0518  NA 134* 136 137  --  137  K 3.5 3.3* 3.6  --  4.4  CL 95* 97* 98  --  101  CO2 28 26 28   --  26  GLUCOSE 87 142* 90  --  92  BUN 15 17 14   --  18  CREATININE 8.06* 8.17* 6.30*  --  7.11*  CALCIUM  8.9 8.8* 9.0  --  9.3  MG 2.1 2.0  --  2.0  --   PHOS 4.7*  5.0* 3.7  3.6 3.1 4.1 3.7    Liver Function Tests: Recent Labs  Lab 05/17/24 0830 05/19/24 0842 05/20/24 0459 05/23/24 0518  ALBUMIN  3.2* 3.3* 3.4* 3.5   No results for input(s): LIPASE, AMYLASE in the last 168 hours. No results for input(s): AMMONIA in the last 168 hours.  CBC: Recent Labs  Lab 05/17/24 0830 05/19/24 0842  WBC 5.8 6.0  HGB 9.6* 9.4*  HCT 29.3* 29.2*  MCV 97.3 97.7  PLT 316 313    Cardiac Enzymes: No results for input(s): CKTOTAL, CKMB, CKMBINDEX, TROPONINI in the last 168 hours.  BNP: Invalid  input(s): POCBNP  CBG: No results for input(s): GLUCAP in the last 168 hours.   Microbiology: Results for orders placed or performed during the hospital encounter of 04/07/24  Resp panel by RT-PCR (RSV, Flu A&B, Covid) Anterior Nasal Swab     Status: None   Collection Time: 04/07/24  4:46 PM   Specimen: Anterior Nasal Swab  Result Value Ref Range Status   SARS Coronavirus 2 by RT PCR NEGATIVE NEGATIVE Final    Comment: (NOTE) SARS-CoV-2 target nucleic acids are NOT DETECTED.  The SARS-CoV-2 RNA is generally detectable in upper respiratory specimens during the acute phase of infection. The lowest concentration of SARS-CoV-2 viral copies this assay can detect is 138 copies/mL. A negative result does not preclude SARS-Cov-2 infection and should not be used as the sole basis for treatment or other patient management decisions. A negative result may occur with  improper specimen collection/handling, submission of specimen other than nasopharyngeal swab, presence of viral mutation(s) within the areas targeted by this assay, and inadequate number of viral copies(<138 copies/mL). A negative result must be combined with clinical observations, patient history, and epidemiological information. The expected result is Negative.  Fact Sheet for Patients:  BloggerCourse.com  Fact Sheet for Healthcare Providers:  SeriousBroker.it  This test is no t yet approved or cleared by the [place]  FDA and  has been authorized for detection and/or diagnosis of SARS-CoV-2 by FDA under an Emergency Use Authorization (EUA). This EUA will remain  in effect (meaning this test can be used) for the duration of the COVID-19 declaration under Section 564(b)(1) of the Act, 21 U.S.C.section 360bbb-3(b)(1), unless the authorization is terminated  or revoked sooner.       Influenza A by PCR NEGATIVE NEGATIVE Final   Influenza B by PCR NEGATIVE NEGATIVE  Final    Comment: (NOTE) The Xpert Xpress SARS-CoV-2/FLU/RSV plus assay is intended as an aid in the diagnosis of influenza from Nasopharyngeal swab specimens and should not be used as a sole basis for treatment. Nasal washings and aspirates are unacceptable for Xpert Xpress SARS-CoV-2/FLU/RSV testing.  Fact Sheet for Patients: BloggerCourse.com  Fact Sheet for Healthcare Providers: SeriousBroker.it  This test is not yet approved or cleared by the [place]  FDA and has been authorized for detection and/or diagnosis of SARS-CoV-2 by FDA under an Emergency Use Authorization (EUA). This EUA will remain in effect (meaning this test can be used) for the duration of the COVID-19 declaration under Section 564(b)(1) of the Act, 21 U.S.C. section 360bbb-3(b)(1), unless the authorization is terminated or revoked.     Resp Syncytial Virus by PCR NEGATIVE NEGATIVE Final    Comment: (NOTE) Fact Sheet for Patients: BloggerCourse.com  Fact Sheet for Healthcare Providers: SeriousBroker.it  This test is not yet approved or cleared by the [place]  FDA and has been authorized for detection and/or diagnosis of SARS-CoV-2 by FDA under an Emergency Use Authorization (EUA). This EUA will remain in effect (meaning this test can be used) for the duration of the COVID-19 declaration under Section 564(b)(1) of the Act, 21 U.S.C. section 360bbb-3(b)(1), unless the authorization is terminated or revoked.  Performed at [facility], [address] [place], [place] [address]     Coagulation Studies: No results for input(s): LABPROT, INR in the last 72 hours.  Urinalysis: No results for input(s): COLORURINE, LABSPEC, PHURINE, GLUCOSEU, HGBUR, BILIRUBINUR, KETONESUR, PROTEINUR, UROBILINOGEN, NITRITE, LEUKOCYTESUR in the last 72 hours.  Invalid input(s):  APPERANCEUR    Imaging: No results found.   Medications:     bisacodyl   10 mg Oral QHS   buPROPion   300 mg Oral Daily   calcitRIOL   0.25 mcg Oral Daily   Chlorhexidine  Gluconate Cloth  6 each Topical Daily   epoetin  alfa-epbx (RETACRIT ) injection  4,000 Units Intravenous Q T,Th,Sat-1800   fludrocortisone   0.4 mg Oral Daily   heparin   5,000 Units Subcutaneous Q8H   influenza vac split trivalent PF  0.5 mL Intramuscular Tomorrow-1000   midodrine   10 mg Oral TID WC   polyethylene glycol  17 g Oral BID   sevelamer  carbonate  1,600 mg Oral TID WC   venlafaxine  XR  150 mg Oral Q breakfast   And   venlafaxine  XR  75 mg Oral Q breakfast   acetaminophen , bisacodyl , butalbital -acetaminophen -caffeine , ondansetron  (ZOFRAN ) IV, ondansetron   Assessment/ Plan:  [name] is a 58 y.o.  male with a PMHx of hypertension, atrial fibrillation, BPH, hyperlipidemia, anxiety, constipation, morbid obesity, and end stage renal disease on hemodialysis.    [facility]- will need to resubmit for chair at discharge.    End stage renal disease on hemodialysis       Appreciate vascular performing permcath exchange on  9/11.  Dialysis received yesterday.  Multiple alarms produced by PermCath during treatment, despite 1 hour Cathflo dwell.  Will instill Cathflo overnight tonight and evaluate function in AM.  May require vascular evaluation.  Next treatment scheduled for Thursday.     2.  Anemia of chronic kidney disease. Hgb 9.4. Continue retacrit  4000 units with dialysis.      3.  Secondary hyperparathyroidism. PTH 199 during recent admission. Phos 3.1 today will continue Renvela  1 tabs TID.              Will continue to monitor bone minerals during this admission.  Currently acceptable.   4. Severe orthostatic hypotension Workup so far has included 2D echo from 04/11/2024 which shows LVEF 65 to 70%, normal LV systolic function, mild concentric LVH, normal diastolic parameters, normal  right ventricular systolic function.  A.m. cortisol level normal from 04/24/2024.  TSH normal.  Continue TEDs,and abd binders.  Continue midodrine  10 mg 3 times a day along with Florinef  to 0.4mg  daily for blood pressure support. Patient reports able to ambulate in hall   LOS: [address] 9/24/202512:41 PM

## 2024-05-23 NOTE — Progress Notes (Signed)
 Progress Note   [name] FMW:[id number] DOB: August 01, 1966 DOA: 04/15/2024     34 DOS: the patient was seen and examined on 05/23/2024   Brief hospital course: 58 y.o. male with medical history significant of newly ESRD recently started on HD, atrial fibrillation, anxiety and depression, morbid obesity who presented with complaints of weakness and falls.   Pt was very frustrated with his medical and mental problems, and wanted us  to make him better.  Pt complained of fatigue, lack of energy, and weakness, causing to fall several times since he was discharged from hospital on 04/13/24.  No focal neuro deficits.  Pt said he has had long standing mental health issues and needs to see psych.  Pt complained of constipation for the past 2 months that wasn't addressed during his last hospitalization.     Of note, pt was hospitalized from 04/07/24 to 04/13/24 during which he was started on dialysis, and was discharged after dialysis on 8/15 to continue outpatient dialysis TTS.   ED Course: initial vitals: afebrile, pulse 105, BP 86/65, RR 12, sating 99% on room air.  Labs consistent with ESRD.  CXR no acute finding.  EKG showed sinus tachycardia.  CT head no acute finding.  ED provider reported pt said he would be better off not living at this point but does not have a plan for suicide and is going to receive help.  Psych consult placed.  Nephrology consultation.  9/24.  Patient's been here 38 days.  Still has orthostatic hypotension.  Patient on midodrine  and Florinef .  Assessment and Plan: * Orthostatic hypotension Patient on midodrine  3 times daily 10 mg and Florinef  to 0.4 mg.  A.m. cortisol was normal.  Patient still drops his blood pressure quite a bit when he stands up.  Continue TED hose  Weakness Continue working with physical therapy  ESRD (end stage renal disease) (HCC) Hemodialysis as per nephrology  Anemia of chronic disease Last hemoglobin 9.4  Obesity (BMI 30-39.9) BMI  38.58        Subjective: Patient frustrated that his blood pressure is high but when he stands up it drops down quite a bit.  Admitted 38 days ago with weakness and falls.  Admitted 38 days ago with weakness and falls.  Physical Exam: Vitals:   05/23/24 0847 05/23/24 0848 05/23/24 0850 05/23/24 1456  BP: (!) 146/104 (!) 148/103 113/78 (!) 136/94  Pulse: (!) 130 (!) 116 61 (!) 44  Resp:    16  Temp: 97.9 F (36.6 C)   98.2 F (36.8 C)  TempSrc: Oral   Oral  SpO2: 100% 100% 91% 91%  Weight:      Height:       Physical Exam HENT:     Head: Normocephalic.  Eyes:     General: Lids are normal.     Conjunctiva/sclera: Conjunctivae normal.  Cardiovascular:     Rate and Rhythm: Normal rate and regular rhythm.     Heart sounds: Normal heart sounds, S1 normal and S2 normal.  Pulmonary:     Breath sounds: No decreased breath sounds, wheezing, rhonchi or rales.  Abdominal:     Palpations: Abdomen is soft.     Tenderness: There is no abdominal tenderness.  Musculoskeletal:     Right lower leg: No swelling.     Left lower leg: No swelling.  Skin:    General: Skin is warm.     Findings: No rash.  Neurological:     Mental Status: He  is alert and oriented to person, place, and time.     Data Reviewed: Creatinine 7.11, hemoglobin 9.4, platelet count 313, white blood count 6.0   Disposition: Status is: Inpatient Remains inpatient appropriate because: TOC trying to work on disposition plan  Planned Discharge Destination: To be determined    Time spent: 28 minutes  Author: Charlie Patterson, MD 05/23/2024 4:05 PM  For on call review [email or web address].

## 2024-05-23 NOTE — Progress Notes (Signed)
   05/23/24 1933  Hemodialysis Catheter Right Internal jugular Double lumen Permanent (Tunneled)  Placement Date/Time: 05/10/24 1421   Serial / Lot #: [id number]  Expiration Date: 05/10/24  Time Out: Correct patient;Correct site;Correct procedure  Maximum sterile barrier precautions: Hand hygiene;Cap;Mask;Sterile gown;Sterile gloves;Large sterile s...  Site Condition No complications  Blue Lumen Status Flushed;Blood return noted;Antimicrobial dead end cap;Alteplase  administered  Red Lumen Status Flushed;Antimicrobial dead end cap;Blood return noted;Alteplase  administered  Purple Lumen Status N/A  Catheter fill solution Other (Comment) (alteplase )  Catheter fill volume (Arterial) 1.6 cc  Catheter fill volume (Venous) 1.6  Dressing Type Transparent  Dressing Status Antimicrobial disc/dressing in place;Clean, Dry, Intact  Interventions Other (Comment) (assessed)  Drainage Description None  Dressing Change Due 05/29/24  Post treatment catheter status Capped and Clamped

## 2024-05-24 DIAGNOSIS — T8249XS Other complication of vascular dialysis catheter, sequela: Secondary | ICD-10-CM

## 2024-05-24 DIAGNOSIS — E559 Vitamin D deficiency, unspecified: Secondary | ICD-10-CM | POA: Insufficient documentation

## 2024-05-24 DIAGNOSIS — F3289 Other specified depressive episodes: Secondary | ICD-10-CM

## 2024-05-24 DIAGNOSIS — Z8679 Personal history of other diseases of the circulatory system: Secondary | ICD-10-CM

## 2024-05-24 LAB — MAGNESIUM: Magnesium: 2.1 mg/dL (ref 1.7–2.4)

## 2024-05-24 LAB — PHOSPHORUS: Phosphorus: 2.6 mg/dL (ref 2.5–4.6)

## 2024-05-24 MED ORDER — VITAMIN D 25 MCG (1000 UNIT) PO TABS
1000.0000 [IU] | ORAL_TABLET | Freq: Every day | ORAL | Status: DC
  Administered 2024-05-25 – 2024-06-19 (×25): 1000 [IU] via ORAL
  Filled 2024-05-24 (×25): qty 1

## 2024-05-24 NOTE — Plan of Care (Signed)

## 2024-05-24 NOTE — Progress Notes (Signed)
 Progress Note   [name] FMW:[id number] DOB: Jan 22, 1966 DOA: 04/15/2024     35 DOS: the patient was seen and examined on 05/24/2024   Brief hospital course: 58 y.o. male with medical history significant of newly ESRD recently started on HD, atrial fibrillation, anxiety and depression, morbid obesity who presented with complaints of weakness and falls.   Pt was very frustrated with his medical and mental problems, and wanted us  to make him better.  Pt complained of fatigue, lack of energy, and weakness, causing to fall several times since he was discharged from hospital on 04/13/24.  No focal neuro deficits.  Pt said he has had long standing mental health issues and needs to see psych.  Pt complained of constipation for the past 2 months that wasn't addressed during his last hospitalization.     Of note, pt was hospitalized from 04/07/24 to 04/13/24 during which he was started on dialysis, and was discharged after dialysis on 8/15 to continue outpatient dialysis TTS.   ED Course: initial vitals: afebrile, pulse 105, BP 86/65, RR 12, sating 99% on room air.  Labs consistent with ESRD.  CXR no acute finding.  EKG showed sinus tachycardia.  CT head no acute finding.  ED provider reported pt said he would be better off not living at this point but does not have a plan for suicide and is going to receive help.  Psych consult placed.  Nephrology consultation.  9/24.  Patient's been here 38 days.  Still has orthostatic hypotension.  Patient on midodrine  and Florinef . 9/25.  Blood pressure stable while on dialysis.  Had to keep his head a certain way so the catheter will work.  Assessment and Plan: * Orthostatic hypotension Patient on midodrine  3 times daily 10 mg and Florinef  to 0.4 mg.  A.m. cortisol was normal.  Patient still drops his blood pressure quite a bit when he stands up.  Continue TED hose.  Continue working with physical therapy  General weakness Continue working with  physical therapy  ESRD (end stage renal disease) (HCC) Patient seen on hemodialysis today  Anemia of chronic disease Last hemoglobin 9.4  Depression On Wellbutrin  and Effexor   Vitamin D  deficiency Supplement vitamin D   History of atrial fibrillation Not on anticoagulation.  Holding Toprol  with low blood pressure.  Currently in sinus rhythm.  Obesity (BMI 30-39.9) BMI 37.61  Clotted dialysis access PermCath exchange by vascular surgery on 9/11.        Subjective: Patient has to sit with a certain way with his head for the dialysis catheter to work.  Patient still has poor appetite but trying to eat.  Still with orthostatic hypotension.  Physical Exam: Vitals:   05/24/24 1130 05/24/24 1200 05/24/24 1212 05/24/24 1215  BP: (!) 152/87 (!) 149/102 (!) 158/134 (!) 144/83  Pulse: 95 (!) 104 (!) 101 95  Resp: 19 (!) 24 15 (!) 23  Temp:    98.4 F (36.9 C)  TempSrc:      SpO2: 97% 98% 97% 98%  Weight:      Height:       Physical Exam HENT:     Head: Normocephalic.  Eyes:     General: Lids are normal.     Conjunctiva/sclera: Conjunctivae normal.  Cardiovascular:     Rate and Rhythm: Normal rate and regular rhythm.     Heart sounds: Normal heart sounds, S1 normal and S2 normal.  Pulmonary:     Breath sounds: No decreased breath sounds, wheezing,  rhonchi or rales.  Abdominal:     Palpations: Abdomen is soft.     Tenderness: There is no abdominal tenderness.  Musculoskeletal:     Right lower leg: No swelling.     Left lower leg: No swelling.  Skin:    General: Skin is warm.     Findings: No rash.  Neurological:     Mental Status: He is alert and oriented to person, place, and time.     Data Reviewed: Phosphorus 2.6, magnesium 2.1  Disposition: Status is: Inpatient Remains inpatient appropriate because: Patient needs a payer source for his next place to be.  Planned Discharge Destination: Potentially group home    Time spent: 28  minutes  Author: Charlie Patterson, MD 05/24/2024 2:51 PM  For on call review [email or web address].

## 2024-05-24 NOTE — Assessment & Plan Note (Signed)
-   Supplement vitamin D 

## 2024-05-24 NOTE — Assessment & Plan Note (Addendum)
 Not on anticoagulation.  Holding Toprol  with low blood pressure.  Sinus arrhythmia.

## 2024-05-24 NOTE — Progress Notes (Signed)
 [facility]  ROUNDING NOTE   Subjective:   Patient seen and evaluated during dialysis   HEMODIALYSIS FLOWSHEET:  Blood Flow Rate (mL/min): 349 mL/min Arterial Pressure (mmHg): -254.94 mmHg Venous Pressure (mmHg): 134.13 mmHg TMP (mmHg): 6.26 mmHg Ultrafiltration Rate (mL/min): 392 mL/min Dialysate Flow Rate (mL/min): 299 ml/min Dialysis Fluid Bolus: Normal Saline Bolus Amount (mL): 300 mL  Denies pain or discomfort Access functioning well during treatment.   Objective:  Vital signs in last 24 hours:  Temp:  [97.8 F (36.6 C)-99.4 F (37.4 C)] 97.8 F (36.6 C) (09/25 0817) Pulse Rate:  [44-95] 63 (09/25 1030) Resp:  [16-21] 16 (09/25 1030) BP: (120-162)/(80-135) 139/87 (09/25 1030) SpO2:  [91 %-100 %] 98 % (09/25 1030) Weight:  [112.2 kg] 112.2 kg (09/25 0817)  Weight change:  Filed Weights   05/22/24 0856 05/22/24 1557 05/24/24 0817  Weight: 115.1 kg 115.1 kg 112.2 kg    Intake/Output: I/O last 3 completed shifts: In: 480 [P.O.:480] Out: -    Intake/Output this shift:  No intake/output data recorded.  Physical Exam: General: NAD  Head: Normocephalic  Eyes: Anicteric  Lungs:  Clear, Room air  Heart: Regular rate   Abdomen:  Soft  Extremities:  no peripheral edema.  Neurologic: Alert  Skin: Intact  Access: Rt chest permcath    Basic Metabolic Panel: Recent Labs  Lab 05/19/24 0842 05/20/24 0459 05/22/24 0738 05/23/24 0518 05/24/24 0900  NA 136 137  --  137  --   K 3.3* 3.6  --  4.4  --   CL 97* 98  --  101  --   CO2 26 28  --  26  --   GLUCOSE 142* 90  --  92  --   BUN 17 14  --  18  --   CREATININE 8.17* 6.30*  --  7.11*  --   CALCIUM  8.8* 9.0  --  9.3  --   MG 2.0  --  2.0  --  2.1  PHOS 3.7  3.6 3.1 4.1 3.7 2.6    Liver Function Tests: Recent Labs  Lab 05/19/24 0842 05/20/24 0459 05/23/24 0518  ALBUMIN  3.3* 3.4* 3.5   No results for input(s): LIPASE, AMYLASE in the last 168 hours. No results for input(s):  AMMONIA in the last 168 hours.  CBC: Recent Labs  Lab 05/19/24 0842  WBC 6.0  HGB 9.4*  HCT 29.2*  MCV 97.7  PLT 313    Cardiac Enzymes: No results for input(s): CKTOTAL, CKMB, CKMBINDEX, TROPONINI in the last 168 hours.  BNP: Invalid input(s): POCBNP  CBG: No results for input(s): GLUCAP in the last 168 hours.   Microbiology: Results for orders placed or performed during the hospital encounter of 04/07/24  Resp panel by RT-PCR (RSV, Flu A&B, Covid) Anterior Nasal Swab     Status: None   Collection Time: 04/07/24  4:46 PM   Specimen: Anterior Nasal Swab  Result Value Ref Range Status   SARS Coronavirus 2 by RT PCR NEGATIVE NEGATIVE Final    Comment: (NOTE) SARS-CoV-2 target nucleic acids are NOT DETECTED.  The SARS-CoV-2 RNA is generally detectable in upper respiratory specimens during the acute phase of infection. The lowest concentration of SARS-CoV-2 viral copies this assay can detect is 138 copies/mL. A negative result does not preclude SARS-Cov-2 infection and should not be used as the sole basis for treatment or other patient management decisions. A negative result may occur with  improper specimen collection/handling, submission of specimen other  than nasopharyngeal swab, presence of viral mutation(s) within the areas targeted by this assay, and inadequate number of viral copies(<138 copies/mL). A negative result must be combined with clinical observations, patient history, and epidemiological information. The expected result is Negative.  Fact Sheet for Patients:  BloggerCourse.com  Fact Sheet for Healthcare Providers:  SeriousBroker.it  This test is no t yet approved or cleared by the [place]  FDA and  has been authorized for detection and/or diagnosis of SARS-CoV-2 by FDA under an Emergency Use Authorization (EUA). This EUA will remain  in effect (meaning this test can be used) for the  duration of the COVID-19 declaration under Section 564(b)(1) of the Act, 21 U.S.C.section 360bbb-3(b)(1), unless the authorization is terminated  or revoked sooner.       Influenza A by PCR NEGATIVE NEGATIVE Final   Influenza B by PCR NEGATIVE NEGATIVE Final    Comment: (NOTE) The Xpert Xpress SARS-CoV-2/FLU/RSV plus assay is intended as an aid in the diagnosis of influenza from Nasopharyngeal swab specimens and should not be used as a sole basis for treatment. Nasal washings and aspirates are unacceptable for Xpert Xpress SARS-CoV-2/FLU/RSV testing.  Fact Sheet for Patients: BloggerCourse.com  Fact Sheet for Healthcare Providers: SeriousBroker.it  This test is not yet approved or cleared by the [place]  FDA and has been authorized for detection and/or diagnosis of SARS-CoV-2 by FDA under an Emergency Use Authorization (EUA). This EUA will remain in effect (meaning this test can be used) for the duration of the COVID-19 declaration under Section 564(b)(1) of the Act, 21 U.S.C. section 360bbb-3(b)(1), unless the authorization is terminated or revoked.     Resp Syncytial Virus by PCR NEGATIVE NEGATIVE Final    Comment: (NOTE) Fact Sheet for Patients: BloggerCourse.com  Fact Sheet for Healthcare Providers: SeriousBroker.it  This test is not yet approved or cleared by the [place]  FDA and has been authorized for detection and/or diagnosis of SARS-CoV-2 by FDA under an Emergency Use Authorization (EUA). This EUA will remain in effect (meaning this test can be used) for the duration of the COVID-19 declaration under Section 564(b)(1) of the Act, 21 U.S.C. section 360bbb-3(b)(1), unless the authorization is terminated or revoked.  Performed at [facility], [address] [place], [place] [address]     Coagulation Studies: No results for input(s):  LABPROT, INR in the last 72 hours.  Urinalysis: No results for input(s): COLORURINE, LABSPEC, PHURINE, GLUCOSEU, HGBUR, BILIRUBINUR, KETONESUR, PROTEINUR, UROBILINOGEN, NITRITE, LEUKOCYTESUR in the last 72 hours.  Invalid input(s): APPERANCEUR    Imaging: No results found.   Medications:     bisacodyl   10 mg Oral QHS   buPROPion   300 mg Oral Daily   calcitRIOL   0.25 mcg Oral Daily   Chlorhexidine  Gluconate Cloth  6 each Topical Daily   epoetin  alfa-epbx (RETACRIT ) injection  4,000 Units Intravenous Q T,Th,Sat-1800   fludrocortisone   0.4 mg Oral Daily   heparin   5,000 Units Subcutaneous Q8H   influenza vac split trivalent PF  0.5 mL Intramuscular Tomorrow-1000   midodrine   10 mg Oral TID WC   polyethylene glycol  17 g Oral BID   sevelamer  carbonate  1,600 mg Oral TID WC   venlafaxine  XR  150 mg Oral Q breakfast   And   venlafaxine  XR  75 mg Oral Q breakfast   acetaminophen , bisacodyl , butalbital -acetaminophen -caffeine , ondansetron  (ZOFRAN ) IV, ondansetron   Assessment/ Plan:  [name] is a 58 y.o.  male with a PMHx of hypertension, atrial fibrillation, BPH,  hyperlipidemia, anxiety, constipation, morbid obesity, and end stage renal disease on hemodialysis.    [facility]- will need to resubmit for chair at discharge.    End stage renal disease on hemodialysis       Appreciate vascular performing permcath exchange on 9/11.  Patient received a cathflo dwell overnight, functioning well today. Will continue to monitor for need of exchange. Next treatment scheduled for Saturday.     2.  Anemia of chronic kidney disease. Hgb 9.4. Continue retacrit  4000 units with dialysis.      3.  Secondary hyperparathyroidism. PTH 199 during recent admission. Phos 3.1 today will continue Renvela  1 tabs TID.              Calcium  and phos within optimal range.    4. Severe orthostatic hypotension Workup so far has included 2D echo from 04/11/2024  which shows LVEF 65 to 70%, normal LV systolic function, mild concentric LVH, normal diastolic parameters, normal right ventricular systolic function.  A.m. cortisol level normal from 04/24/2024.  TSH normal.  Continue TEDs,and abd binders.  Continue midodrine  10 mg 3 times a day along with Florinef  to 0.4mg  daily for blood pressure support. Patient reports able to ambulate in hall   LOS: [address] 9/25/202510:45 AM

## 2024-05-24 NOTE — Assessment & Plan Note (Addendum)
 PermCath exchange by vascular surgery on 9/11.  Catheter seems to be positional.

## 2024-05-24 NOTE — Progress Notes (Signed)
   05/24/24 1215  Vitals  Temp 98.4 F (36.9 C)  Pulse Rate 95  Resp (!) 23  BP (!) 144/83  SpO2 98 %  Post Treatment  Dialyzer Clearance Clotted  Hemodialysis Intake (mL) 0 mL  Liters Processed 71.6  Fluid Removed (mL) 400 mL  Tolerated HD Treatment Yes   Received patient in bed to unit.  Alert and oriented.  Informed consent signed and in chart.   TX duration:3hrs 23mins due to clotting   Patient tolerated well.  Transported back to the room  Alert, without acute distress.  Hand-off given to patient's nurse.   [facility]: positional  Total UF removed: Medication(s) given: none    [facility]

## 2024-05-24 NOTE — Assessment & Plan Note (Signed)
 On Wellbutrin  and Effexor 

## 2024-05-25 MED ORDER — FLUDROCORTISONE ACETATE 0.1 MG PO TABS
0.3000 mg | ORAL_TABLET | Freq: Every day | ORAL | Status: DC
  Administered 2024-05-25 – 2024-06-05 (×10): 0.3 mg via ORAL
  Filled 2024-05-25 (×13): qty 3

## 2024-05-25 NOTE — Progress Notes (Signed)
 Progress Note   [name] FMW:[id number] DOB: [birth year] DOA: 04/15/2024     36 DOS: the patient was seen and examined on 05/25/2024   Brief hospital course: 58 y.o. male with medical history significant of newly ESRD recently started on HD, atrial fibrillation, anxiety and depression, morbid obesity who presented with complaints of weakness and falls.   Pt was very frustrated with his medical and mental problems, and wanted us  to make him better.  Pt complained of fatigue, lack of energy, and weakness, causing to fall several times since he was discharged from hospital on 04/13/24.  No focal neuro deficits.  Pt said he has had long standing mental health issues and needs to see psych.  Pt complained of constipation for the past 2 months that wasn't addressed during his last hospitalization.     Of note, pt was hospitalized from 04/07/24 to 04/13/24 during which he was started on dialysis, and was discharged after dialysis on 8/15 to continue outpatient dialysis TTS.   ED Course: initial vitals: afebrile, pulse 105, BP 86/65, RR 12, sating 99% on room air.  Labs consistent with ESRD.  CXR no acute finding.  EKG showed sinus tachycardia.  CT head no acute finding.  ED provider reported pt said he would be better off not living at this point but does not have a plan for suicide and is going to receive help.  Psych consult placed.  Nephrology consultation.  9/24.  Patient's been here 38 days.  Still has orthostatic hypotension.  Patient on midodrine  and Florinef . 9/25.  Blood pressure stable while on dialysis.  Had to keep his head a certain way so the catheter will work.  Assessment and Plan: * Orthostatic hypotension Patient on midodrine  3 times daily 10 mg and Florinef  decreased to 0.3 mg.  A.m. cortisol was normal.  Patient still drops his blood pressure quite a bit when he stands up.  Continue TED hose.  Continue working with physical therapy  General weakness Continue working  with physical therapy  ESRD (end stage renal disease) (HCC) Hemodialysis as per nephrology  Anemia of chronic disease Last hemoglobin 9.4  Depression On Wellbutrin  and Effexor   Vitamin D  deficiency Supplement vitamin D   History of atrial fibrillation Not on anticoagulation.  Holding Toprol  with low blood pressure.  Currently in sinus rhythm.  Obesity (BMI 30-39.9) BMI 37.61  Clotted dialysis access PermCath exchange by vascular surgery on 9/11.        Subjective: Patient stated he ate some cereal.  Food does not taste good.  Poor appetite.  Still with orthostatic hypotension.  Admitted 40 days ago with weakness and falls.  Physical Exam: Vitals:   05/24/24 2041 05/25/24 0449 05/25/24 0813 05/25/24 1546  BP: (!) 151/107 (!) 152/109 129/86 (!) 130/91  Pulse: 77 (!) 52 64 (!) 46  Resp: 20 19 15 16   Temp: 98.5 F (36.9 C) 98.2 F (36.8 C) 98 F (36.7 C) 98.4 F (36.9 C)  TempSrc: Oral   Oral  SpO2: 96% 98% 94% 96%  Weight:      Height:       Physical Exam HENT:     Head: Normocephalic.  Eyes:     General: Lids are normal.     Conjunctiva/sclera: Conjunctivae normal.  Cardiovascular:     Rate and Rhythm: Normal rate and regular rhythm.     Heart sounds: Normal heart sounds, S1 normal and S2 normal.  Pulmonary:     Breath sounds: No decreased  breath sounds, wheezing, rhonchi or rales.  Abdominal:     Palpations: Abdomen is soft.     Tenderness: There is no abdominal tenderness.  Musculoskeletal:     Right lower leg: No swelling.     Left lower leg: No swelling.  Skin:    General: Skin is warm.     Findings: No rash.  Neurological:     Mental Status: He is alert and oriented to person, place, and time.     Data Reviewed: No new labs  Disposition: Status is: Inpatient Remains inpatient appropriate because: We do not have a disposition plan  Planned Discharge Destination: We have to figure out a plan.    Time spent: 27 minutes  Author: Charlie Patterson, MD 05/25/2024 4:36 PM  For on call review [email or web address].

## 2024-05-25 NOTE — TOC Progression Note (Signed)
 Transition of Care [facility]) - Progression Note    Patient Details  Name: Brady Edwards MRN: [id number] Date of Birth: Mar 12, 1966  [facility]) CM/SW Contact  Alvaro Louder, [place] Phone Number: 05/25/2024, 4:20 PM  Clinical Narrative:   [name] Sermon to Patient he indicated that he has been in contact with Karna Rosier [phone]) Director of Disability Services at the [facility]. The patient told [facility] that he does not qualify for the 5 month program but he qualifies for another program that they offer. [facility] reached out to Karna Rosier and left a message to get more information on what program he qualifies for. Awaiting call back  TOC to follow for discharge                      Expected Discharge Plan and Services                                               Social Drivers of Health (SDOH) Interventions SDOH Screenings   Food Insecurity: Food Insecurity Present (04/16/2024)  Housing: High Risk (04/16/2024)  Transportation Needs: Unmet Transportation Needs (04/16/2024)  Utilities: At Risk (04/16/2024)  Depression (PHQ2-9): High Risk (02/27/2024)  Tobacco Use: High Risk (05/19/2024)    Readmission Risk Interventions     No data to display

## 2024-05-26 LAB — CBC
HCT: 31.3 % — ABNORMAL LOW (ref 39.0–52.0)
Hemoglobin: 10.4 g/dL — ABNORMAL LOW (ref 13.0–17.0)
MCH: 31.4 pg (ref 26.0–34.0)
MCHC: 33.2 g/dL (ref 30.0–36.0)
MCV: 94.6 fL (ref 80.0–100.0)
Platelets: 281 K/uL (ref 150–400)
RBC: 3.31 MIL/uL — ABNORMAL LOW (ref 4.22–5.81)
RDW: 14.4 % (ref 11.5–15.5)
WBC: 6.6 K/uL (ref 4.0–10.5)
nRBC: 0 % (ref 0.0–0.2)

## 2024-05-26 LAB — RENAL FUNCTION PANEL
Albumin: 3.6 g/dL (ref 3.5–5.0)
Anion gap: 11 (ref 5–15)
BUN: 24 mg/dL — ABNORMAL HIGH (ref 6–20)
CO2: 28 mmol/L (ref 22–32)
Calcium: 9.3 mg/dL (ref 8.9–10.3)
Chloride: 96 mmol/L — ABNORMAL LOW (ref 98–111)
Creatinine, Ser: 8.2 mg/dL — ABNORMAL HIGH (ref 0.61–1.24)
GFR, Estimated: 7 mL/min — ABNORMAL LOW (ref >60)
Glucose, Bld: 89 mg/dL (ref 70–99)
Phosphorus: 4.1 mg/dL (ref 2.5–4.6)
Potassium: 3.5 mmol/L (ref 3.5–5.1)
Sodium: 135 mmol/L (ref 135–145)

## 2024-05-26 LAB — MAGNESIUM: Magnesium: 2.1 mg/dL (ref 1.7–2.4)

## 2024-05-26 LAB — PHOSPHORUS: Phosphorus: 4 mg/dL (ref 2.5–4.6)

## 2024-05-26 MED ORDER — HEPARIN SODIUM (PORCINE) 1000 UNIT/ML IJ SOLN
INTRAMUSCULAR | Status: AC
Start: 2024-05-26 — End: 2024-05-26
  Filled 2024-05-26: qty 4

## 2024-05-26 MED ORDER — EPOETIN ALFA-EPBX 4000 UNIT/ML IJ SOLN
INTRAMUSCULAR | Status: AC
  Filled 2024-05-26: qty 1

## 2024-05-26 MED ORDER — HEPARIN SODIUM (PORCINE) 1000 UNIT/ML IJ SOLN
INTRAMUSCULAR | Status: AC
  Filled 2024-05-26: qty 3

## 2024-05-26 MED ORDER — HEPARIN SODIUM (PORCINE) 1000 UNIT/ML DIALYSIS
25.0000 [IU]/kg | INTRAMUSCULAR | Status: DC | PRN
  Administered 2024-05-26: 2800 [IU] via INTRAVENOUS_CENTRAL
  Filled 2024-05-26: qty 3

## 2024-05-26 NOTE — Progress Notes (Signed)
 Hemodialysis Note:  Received patient in bed to unit. Alert and oriented. Informed consent singed and in chart.  Treatment initiated: 0830 Treatment completed: 1212  Access used: Right internal jugular catheter Access issues: None  Patient tolerated well. Transported back to room, alert without acute distress. Report given to patient's RN.  Total UF removed: 0 Medications given: Retacrit  4000 units IV  Post HD weight: 112.7 kg  [facility]

## 2024-05-26 NOTE — Progress Notes (Signed)
 [facility]  PROGRESS NOTE   Subjective:   Patient seen on dialysis.  Patient says he feels much better today.  Objective:  Vital signs: Blood pressure (!) 121/90, pulse 91, temperature 98.2 F (36.8 C), temperature source Oral, resp. rate (!) 22, height 5' 8 (1.727 m), weight 112.7 kg, SpO2 100%.  Intake/Output Summary (Last 24 hours) at 05/26/2024 1232 Last data filed at 05/26/2024 1212 Gross per 24 hour  Intake 0 ml  Output 0 ml  Net 0 ml   Filed Weights   05/24/24 0817 05/26/24 0815 05/26/24 1212  Weight: 112.2 kg 112.7 kg 112.7 kg     Physical Exam: General:  No acute distress  Head:  Normocephalic, atraumatic. Moist oral mucosal membranes  Eyes:  Anicteric  Neck:  Supple  Lungs:   Clear to auscultation, normal effort  Heart:  S1S2 no rubs  Abdomen:   Soft, nontender, bowel sounds present  Extremities:  peripheral edema.  Neurologic:  Awake, alert, following commands  Skin:  No lesions  Access:     Basic Metabolic Panel: Recent Labs  Lab 05/20/24 0459 05/22/24 0738 05/23/24 0518 05/24/24 0900 05/26/24 0830  NA 137  --  137  --  135  K 3.6  --  4.4  --  3.5  CL 98  --  101  --  96*  CO2 28  --  26  --  28  GLUCOSE 90  --  92  --  89  BUN 14  --  18  --  24*  CREATININE 6.30*  --  7.11*  --  8.20*  CALCIUM  9.0  --  9.3  --  9.3  MG  --  2.0  --  2.1 2.1  PHOS 3.1 4.1 3.7 2.6 4.0  4.1   GFR: Estimated Creatinine Clearance: 12 mL/min (A) (by C-G formula based on SCr of 8.2 mg/dL (H)).  Liver Function Tests: Recent Labs  Lab 05/20/24 0459 05/23/24 0518 05/26/24 0830  ALBUMIN  3.4* 3.5 3.6   No results for input(s): LIPASE, AMYLASE in the last 168 hours. No results for input(s): AMMONIA in the last 168 hours.  CBC: Recent Labs  Lab 05/26/24 0830  WBC 6.6  HGB 10.4*  HCT 31.3*  MCV 94.6  PLT 281     HbA1C: Hgb A1c MFr Bld  Date/Time Value Ref Range Status  04/09/2024 04:27 AM 6.0 (H) 4.8 - 5.6 % Final    Comment:     (NOTE)         Prediabetes: 5.7 - 6.4         Diabetes: >6.4         Glycemic control for adults with diabetes: <7.0   12/13/2017 09:01 AM 5.6 4.8 - 5.6 % Final    Comment:             Prediabetes: 5.7 - 6.4          Diabetes: >6.4          Glycemic control for adults with diabetes: <7.0     Urinalysis: No results for input(s): COLORURINE, LABSPEC, PHURINE, GLUCOSEU, HGBUR, BILIRUBINUR, KETONESUR, PROTEINUR, UROBILINOGEN, NITRITE, LEUKOCYTESUR in the last 72 hours.  Invalid input(s): APPERANCEUR    Imaging: No results found.   Medications:     bisacodyl   10 mg Oral QHS   buPROPion   300 mg Oral Daily   calcitRIOL   0.25 mcg Oral Daily   Chlorhexidine  Gluconate Cloth  6 each Topical Daily   cholecalciferol   1,000 Units Oral Daily   epoetin  alfa-epbx (RETACRIT ) injection  4,000 Units Intravenous Q T,Th,Sat-1800   fludrocortisone   0.3 mg Oral Daily   heparin   5,000 Units Subcutaneous Q8H   influenza vac split trivalent PF  0.5 mL Intramuscular Tomorrow-1000   midodrine   10 mg Oral TID WC   polyethylene glycol  17 g Oral BID   sevelamer  carbonate  1,600 mg Oral TID WC   venlafaxine  XR  150 mg Oral Q breakfast   And   venlafaxine  XR  75 mg Oral Q breakfast    Assessment/ Plan:     58 y.o.  male with a PMHx of hypertension, atrial fibrillation, BPH, hyperlipidemia, anxiety, constipation, morbid obesity, and end stage renal disease on hemodialysis.    [facility]- will need to resubmit for chair at discharge.    End stage renal disease on hemodialysis       Appreciate vascular performing permcath exchange on 9/11.  Patient received a cathflo dwell overnight, functioning well today. Will continue to monitor for need of exchange.  Tolerating treatment well today.      2.  Anemia of chronic kidney disease. Hgb 10.4. Continue retacrit  4000 units with dialysis.      3.  Secondary hyperparathyroidism. PTH 199 during recent admission. Phos  4.1 today will continue Renvela  1 tabs TID.              Calcium  and phos within optimal range.    4. Severe orthostatic hypotension Workup so far has included 2D echo from 04/11/2024 which shows LVEF 65 to 70%, normal LV systolic function, mild concentric LVH, normal diastolic parameters, normal right ventricular systolic function.  A.m. cortisol level normal from 04/24/2024.  TSH normal.  Continue TEDs,and abd binders.  Continue midodrine  10 mg 3 times a day along with Florinef  to 0.4mg  daily for blood pressure support. Patient reports able to ambulate in hall  Labs and medications reviewed. Will continue to follow along with you.   LOS: 37 Pinkey Edman, MD [facility] 9/27/202512:32 PM

## 2024-05-26 NOTE — Progress Notes (Signed)
 Progress Note   [name] FMW:[id number] DOB: [birth year] DOA: 04/15/2024     37 DOS: the patient was seen and examined on 05/26/2024   Brief hospital course: 58 y.o. male with medical history significant of newly ESRD recently started on HD, atrial fibrillation, anxiety and depression, morbid obesity who presented with complaints of weakness and falls.   Pt was very frustrated with his medical and mental problems, and wanted us  to make him better.  Pt complained of fatigue, lack of energy, and weakness, causing to fall several times since he was discharged from hospital on 04/13/24.  No focal neuro deficits.  Pt said he has had long standing mental health issues and needs to see psych.  Pt complained of constipation for the past 2 months that wasn't addressed during his last hospitalization.     Of note, pt was hospitalized from 04/07/24 to 04/13/24 during which he was started on dialysis, and was discharged after dialysis on 8/15 to continue outpatient dialysis TTS.   ED Course: initial vitals: afebrile, pulse 105, BP 86/65, RR 12, sating 99% on room air.  Labs consistent with ESRD.  CXR no acute finding.  EKG showed sinus tachycardia.  CT head no acute finding.  ED provider reported pt said he would be better off not living at this point but does not have a plan for suicide and is going to receive help.  Psych consult placed.  Nephrology consultation.  9/24.  Patient's been here 38 days.  Still has orthostatic hypotension.  Patient on midodrine  and Florinef . 9/25.  Blood pressure stable while on dialysis.  Had to keep his head a certain way so the catheter will work.  Assessment and Plan: * Orthostatic hypotension Patient on midodrine  3 times daily 10 mg and Florinef  decreased to 0.3 mg.  A.m. cortisol was normal.  Patient still drops his blood pressure quite a bit when he stands up.  Continue TED hose.  Continue working with physical therapy  General weakness Continue working  with physical therapy  ESRD (end stage renal disease) (HCC) Hemodialysis as per nephrology  Anemia of chronic disease Last hemoglobin 10.4  Depression On Wellbutrin  and Effexor   Vitamin D  deficiency Supplement vitamin D   History of atrial fibrillation Not on anticoagulation.  Holding Toprol  with low blood pressure.  Sinus arrhythmia while on dialysis  Obesity (BMI 30-39.9) BMI 37.61  Clotted dialysis access PermCath exchange by vascular surgery on 9/11.        Subjective: Patient seen while on dialysis.  Feels okay.  Still awaiting any disposition plans.  Admitted 40 days ago with weakness and falls.  Still with poor appetite.  But forcing self to eat something.  He does enjoy fruit but other things not so much.  Physical Exam: Vitals:   05/26/24 1130 05/26/24 1200 05/26/24 1212 05/26/24 1248  BP: 136/89 131/87 (!) 121/90 (!) 155/96  Pulse: 80 83 91 94  Resp: 17 19 (!) 22 17  Temp:   98.2 F (36.8 C) 98.4 F (36.9 C)  TempSrc:   Oral   SpO2: 100% 97% 100% 98%  Weight:   112.7 kg   Height:       Physical Exam HENT:     Head: Normocephalic.  Eyes:     General: Lids are normal.     Conjunctiva/sclera: Conjunctivae normal.  Cardiovascular:     Rate and Rhythm: Normal rate and regular rhythm.     Heart sounds: Normal heart sounds, S1 normal and S2  normal.  Pulmonary:     Breath sounds: No decreased breath sounds, wheezing, rhonchi or rales.  Abdominal:     Palpations: Abdomen is soft.     Tenderness: There is no abdominal tenderness.  Musculoskeletal:     Right lower leg: No swelling.     Left lower leg: No swelling.  Skin:    General: Skin is warm.     Findings: No rash.  Neurological:     Mental Status: He is alert and oriented to person, place, and time.     Data Reviewed: Creatinine 8.2, hemoglobin 10.4  Disposition: Status is: Inpatient Remains inpatient appropriate because: We do not have a disposition plan  Planned Discharge Destination: We  do not have a disposition plan    Time spent: 27 minutes  Author: Charlie Patterson, MD 05/26/2024 3:07 PM  For on call review [email or web address].

## 2024-05-27 NOTE — Progress Notes (Signed)
 [facility]  PROGRESS NOTE   Subjective:   Patient seen at bedside.  Denies any chest pain or shortness of breath.  Objective:  Vital signs: Blood pressure (!) 131/96, pulse 98, temperature 98.3 F (36.8 C), resp. rate 17, height 5' 8 (1.727 m), weight 112.7 kg, SpO2 99%.  Intake/Output Summary (Last 24 hours) at 05/27/2024 1502 Last data filed at 05/27/2024 1300 Gross per 24 hour  Intake 740 ml  Output --  Net 740 ml   Filed Weights   05/24/24 0817 05/26/24 0815 05/26/24 1212  Weight: 112.2 kg 112.7 kg 112.7 kg     Physical Exam: General:  No acute distress  Head:  Normocephalic, atraumatic. Moist oral mucosal membranes  Eyes:  Anicteric  Neck:  Supple  Lungs:   Clear to auscultation, normal effort  Heart:  S1S2 no rubs  Abdomen:   Soft, nontender, bowel sounds present  Extremities:  peripheral edema.  Neurologic:  Awake, alert, following commands  Skin:  No lesions  Access:     Basic Metabolic Panel: Recent Labs  Lab 05/22/24 0738 05/23/24 0518 05/24/24 0900 05/26/24 0830  NA  --  137  --  135  K  --  4.4  --  3.5  CL  --  101  --  96*  CO2  --  26  --  28  GLUCOSE  --  92  --  89  BUN  --  18  --  24*  CREATININE  --  7.11*  --  8.20*  CALCIUM   --  9.3  --  9.3  MG 2.0  --  2.1 2.1  PHOS 4.1 3.7 2.6 4.0  4.1   GFR: Estimated Creatinine Clearance: 12 mL/min (A) (by C-G formula based on SCr of 8.2 mg/dL (H)).  Liver Function Tests: Recent Labs  Lab 05/23/24 0518 05/26/24 0830  ALBUMIN  3.5 3.6   No results for input(s): LIPASE, AMYLASE in the last 168 hours. No results for input(s): AMMONIA in the last 168 hours.  CBC: Recent Labs  Lab 05/26/24 0830  WBC 6.6  HGB 10.4*  HCT 31.3*  MCV 94.6  PLT 281     HbA1C: Hgb A1c MFr Bld  Date/Time Value Ref Range Status  04/09/2024 04:27 AM 6.0 (H) 4.8 - 5.6 % Final    Comment:    (NOTE)         Prediabetes: 5.7 - 6.4         Diabetes: >6.4         Glycemic control for  adults with diabetes: <7.0   12/13/2017 09:01 AM 5.6 4.8 - 5.6 % Final    Comment:             Prediabetes: 5.7 - 6.4          Diabetes: >6.4          Glycemic control for adults with diabetes: <7.0     Urinalysis: No results for input(s): COLORURINE, LABSPEC, PHURINE, GLUCOSEU, HGBUR, BILIRUBINUR, KETONESUR, PROTEINUR, UROBILINOGEN, NITRITE, LEUKOCYTESUR in the last 72 hours.  Invalid input(s): APPERANCEUR    Imaging: No results found.   Medications:     bisacodyl   10 mg Oral QHS   buPROPion   300 mg Oral Daily   calcitRIOL   0.25 mcg Oral Daily   Chlorhexidine  Gluconate Cloth  6 each Topical Daily   cholecalciferol   1,000 Units Oral Daily   epoetin  alfa-epbx (RETACRIT ) injection  4,000 Units Intravenous Q T,Th,Sat-1800   fludrocortisone   0.3  mg Oral Daily   heparin   5,000 Units Subcutaneous Q8H   influenza vac split trivalent PF  0.5 mL Intramuscular Tomorrow-1000   midodrine   10 mg Oral TID WC   polyethylene glycol  17 g Oral BID   sevelamer  carbonate  1,600 mg Oral TID WC   venlafaxine  XR  150 mg Oral Q breakfast   And   venlafaxine  XR  75 mg Oral Q breakfast    Assessment/ Plan:     58 y.o.  male with a PMHx of hypertension, atrial fibrillation, BPH, hyperlipidemia, anxiety, constipation, morbid obesity, and end stage renal disease on hemodialysis.    [facility]- will need to resubmit for chair at discharge.    End stage renal disease on hemodialysis: Patient had stable dialysis yesterday.  She had permacath exchange during this admission.  Catheter functioned well.      2.  Anemia of chronic kidney disease. Hgb 10.4. Continue retacrit  4000 units with dialysis.      3.  Secondary hyperparathyroidism. PTH 199 during recent admission. Phos 4.1 today will continue Renvela  1 tabs TID.              Calcium  and phos within optimal range.    4. Severe orthostatic hypotension: Workup so far has included 2D echo from 04/11/2024 which  shows LVEF 65 to 70%, normal LV systolic function, mild concentric LVH, normal diastolic parameters, normal right ventricular systolic function.  A.m. cortisol level normal from 04/24/2024.  TSH normal. Continue TEDs,and abd binders.  Continue midodrine  10 mg 3 times a day along with Florinef  to 0.4mg  daily for blood pressure support. Patient reports able to ambulate in hall way.    Labs and medications reviewed. Will continue to follow along with you.   LOS: 38 Pinkey Edman, MD [facility] 9/28/20253:02 PM

## 2024-05-27 NOTE — Progress Notes (Signed)
 Mobility Specialist Progress Note:    05/27/24 0957  Orthostatic Lying   BP- Lying (!) 133/100  Pulse- Lying 88  Orthostatic Sitting  BP- Sitting 102/75  Pulse- Sitting 116  Orthostatic Standing at 0 minutes  BP- Standing at 0 minutes 93/58  Pulse- Standing at 0 minutes 118  Oxygen Therapy  SpO2 99 %  O2 Device Room Air  Mobility  Activity Ambulated with assistance  Level of Assistance Standby assist, set-up cues, supervision of patient - no hands on  Assistive Device None  Distance Ambulated (ft) 400 ft  Range of Motion/Exercises Active;All extremities  Activity Response Tolerated well  Mobility visit 1 Mobility  Mobility Specialist Start Time (ACUTE ONLY) 0957  Mobility Specialist Stop Time (ACUTE ONLY) 1026  Mobility Specialist Time Calculation (min) (ACUTE ONLY) 29 min   Ted hose, ace wraps, and abdominal binder placed prior to mobility. Required supervision to stand and ambulate with no AD. Tolerated well, pt denies dizziness but c/o SOB and fatigue. Returned to room, BP 92/67 and HR in the 140's after ambulation. HR recovered to 95 bpm after rest. Left pt supine, all needs met.  Sherrilee Ditty Mobility Specialist Please contact via Special educational needs teacher or  Rehab office at [phone]

## 2024-05-27 NOTE — Progress Notes (Addendum)
 Progress Note   [name] FMW:[id number] DOB: June 25, 1966 DOA: 04/15/2024     38 DOS: the patient was seen and examined on 05/27/2024   Brief hospital course: 58 y.o. male with medical history significant of newly ESRD recently started on HD, atrial fibrillation, anxiety and depression, morbid obesity who presented with complaints of weakness and falls.   Pt was very frustrated with his medical and mental problems, and wanted us  to make him better.  Pt complained of fatigue, lack of energy, and weakness, causing to fall several times since he was discharged from hospital on 04/13/24.  No focal neuro deficits.  Pt said he has had long standing mental health issues and needs to see psych.  Pt complained of constipation for the past 2 months that wasn't addressed during his last hospitalization.     Of note, pt was hospitalized from 04/07/24 to 04/13/24 during which he was started on dialysis, and was discharged after dialysis on 8/15 to continue outpatient dialysis TTS.   ED Course: initial vitals: afebrile, pulse 105, BP 86/65, RR 12, sating 99% on room air.  Labs consistent with ESRD.  CXR no acute finding.  EKG showed sinus tachycardia.  CT head no acute finding.  ED provider reported pt said he would be better off not living at this point but does not have a plan for suicide and is going to receive help.  Psych consult placed.  Nephrology consultation.  9/24.  Patient's been here 38 days.  Still has orthostatic hypotension.  Patient on midodrine  and Florinef . 9/25.  Blood pressure stable while on dialysis.  Had to keep his head a certain way so the catheter will work.  Assessment and Plan: * Orthostatic hypotension Patient on midodrine  3 times daily 10 mg and Florinef  decreased to 0.3 mg.  A.m. cortisol was normal.  Patient still drops his blood pressure quite a bit when he stands up.  Continue TED hose.  Continue working with physical therapy  General weakness Continue working  with physical therapy  ESRD (end stage renal disease) (HCC) Hemodialysis as per nephrology  Anemia of chronic disease Last hemoglobin 10.4  Depression On Wellbutrin  and Effexor   Vitamin D  deficiency Supplement vitamin D   History of atrial fibrillation Not on anticoagulation.  Holding Toprol  with low blood pressure.  Sinus arrhythmia while on dialysis  Obesity (BMI 30-39.9) BMI 37.78  Clotted dialysis access PermCath exchange by vascular surgery on 9/11.        Subjective: Patient states he did not feel too good when standing up yesterday.  Still orthostatic.  Admitted 41 days ago with weakness and falls.  Physical Exam: Vitals:   05/26/24 2008 05/27/24 0419 05/27/24 0847 05/27/24 0957  BP: (!) 149/93 (!) 158/92 (!) 131/96   Pulse: 88 84 98   Resp: 18 18 17    Temp: 98.2 F (36.8 C) 98.2 F (36.8 C) 98.3 F (36.8 C)   TempSrc:      SpO2: 96% 98% 99% 99%  Weight:      Height:       Physical Exam HENT:     Head: Normocephalic.  Eyes:     General: Lids are normal.     Conjunctiva/sclera: Conjunctivae normal.  Cardiovascular:     Rate and Rhythm: Normal rate and regular rhythm.     Heart sounds: Normal heart sounds, S1 normal and S2 normal.  Pulmonary:     Breath sounds: No decreased breath sounds, wheezing, rhonchi or rales.  Abdominal:  Palpations: Abdomen is soft.     Tenderness: There is no abdominal tenderness.  Musculoskeletal:     Right lower leg: No swelling.     Left lower leg: No swelling.  Skin:    General: Skin is warm.     Findings: No rash.  Neurological:     Mental Status: He is alert and oriented to person, place, and time.     Data Reviewed: Last creatinine 8.2, last hemoglobin 10.4  Disposition: Status is: Inpatient Remains inpatient appropriate because: We do not have a disposition plan  Planned Discharge Destination: Will need some sort of placement    Time spent: 28 minutes  Author: Charlie Patterson, MD 05/27/2024 2:07  PM  For on call review [email or web address].

## 2024-05-28 MED ORDER — MIDODRINE HCL 5 MG PO TABS
15.0000 mg | ORAL_TABLET | Freq: Three times a day (TID) | ORAL | Status: DC
Start: 2024-05-28 — End: 2024-06-06
  Administered 2024-05-28 – 2024-06-05 (×12): 15 mg via ORAL
  Filled 2024-05-28 (×20): qty 3

## 2024-05-28 NOTE — Progress Notes (Signed)
 Progress Note   [name] FMW:[id number] DOB: 10-Mar-1966 DOA: 04/15/2024     39 DOS: the patient was seen and examined on 05/28/2024   Brief hospital course: 58 y.o. male with medical history significant of newly ESRD recently started on HD, atrial fibrillation, anxiety and depression, morbid obesity who presented with complaints of weakness and falls.   Pt was very frustrated with his medical and mental problems, and wanted us  to make him better.  Pt complained of fatigue, lack of energy, and weakness, causing to fall several times since he was discharged from hospital on 04/13/24.  No focal neuro deficits.  Pt said he has had long standing mental health issues and needs to see psych.  Pt complained of constipation for the past 2 months that wasn't addressed during his last hospitalization.     Of note, pt was hospitalized from 04/07/24 to 04/13/24 during which he was started on dialysis, and was discharged after dialysis on 8/15 to continue outpatient dialysis TTS.   ED Course: initial vitals: afebrile, pulse 105, BP 86/65, RR 12, sating 99% on room air.  Labs consistent with ESRD.  CXR no acute finding.  EKG showed sinus tachycardia.  CT head no acute finding.  ED provider reported pt said he would be better off not living at this point but does not have a plan for suicide and is going to receive help.  Psych consult placed.  Nephrology consultation.  9/24.  Patient's been here 38 days.  Still has orthostatic hypotension.  Patient on midodrine  and Florinef . 9/25.  Blood pressure stable while on dialysis.  Had to keep his head a certain way so the catheter will work.  Assessment and Plan: * Orthostatic hypotension Increase midodrine  3 times daily to 15 mg and Florinef  0.3 mg daily.  A.m. cortisol was normal.  Patient still drops his blood pressure quite a bit when he stands up.  Continue TED hose.  Continue working with physical therapy  General weakness Continue working with  physical therapy  ESRD (end stage renal disease) (HCC) Hemodialysis as per nephrology  Anemia of chronic disease Last hemoglobin 10.4  Depression On Wellbutrin  and Effexor   Vitamin D  deficiency Supplement vitamin D   History of atrial fibrillation Not on anticoagulation.  Holding Toprol  with low blood pressure.  Sinus arrhythmia.  Obesity (BMI 30-39.9) BMI 37.78  Clotted dialysis access PermCath exchange by vascular surgery on 9/11.        Subjective: Patient feeling okay.  Trying to eat a little bit better.  Admitted 42 days ago with weakness and falls.  Found to have orthostatic hypotension.  Physical Exam: Vitals:   05/27/24 0957 05/27/24 1554 05/27/24 1930 05/28/24 0321  BP:  139/88 (!) 143/107 (!) 120/92  Pulse:  92 (!) 104 99  Resp:  17 18 17   Temp:  97.8 F (36.6 C) 98.2 F (36.8 C) 98.1 F (36.7 C)  TempSrc:      SpO2: 99% 96% 97% 100%  Weight:      Height:       Physical Exam HENT:     Head: Normocephalic.  Eyes:     General: Lids are normal.     Conjunctiva/sclera: Conjunctivae normal.  Cardiovascular:     Rate and Rhythm: Normal rate and regular rhythm.     Heart sounds: Normal heart sounds, S1 normal and S2 normal.  Pulmonary:     Breath sounds: No decreased breath sounds, wheezing, rhonchi or rales.  Abdominal:  Palpations: Abdomen is soft.     Tenderness: There is no abdominal tenderness.  Musculoskeletal:     Right lower leg: No swelling.     Left lower leg: No swelling.  Skin:    General: Skin is warm.     Findings: No rash.  Neurological:     Mental Status: He is alert and oriented to person, place, and time.     Data Reviewed: No new data  Disposition: Status is: Inpatient Remains inpatient appropriate because: No plan at this point  Planned Discharge Destination: Still do not have a place to go    Time spent: 28 minutes  Author: Charlie Patterson, MD 05/28/2024 2:48 PM  For on call review [email or web address].

## 2024-05-28 NOTE — Progress Notes (Signed)
 Mobility Specialist Progress Note:    05/28/24 1404  Orthostatic Lying   BP- Lying (!) 145/112  Pulse- Lying 115  Orthostatic Sitting  BP- Sitting 111/74  Pulse- Sitting 133  Orthostatic Standing at 0 minutes  BP- Standing at 0 minutes (!) 81/50  Pulse- Standing at 0 minutes 136  Orthostatic Standing at 3 minutes  BP- Standing at 3 minutes 90/42  Pulse- Standing at 3 minutes 140  Mobility  Activity Stood at bedside  Level of Assistance Contact guard assist, steadying assist  Assistive Device None  Range of Motion/Exercises Active;All extremities  Activity Response Tolerated fair  Mobility visit 1 Mobility  Mobility Specialist Start Time (ACUTE ONLY) 1400  Mobility Specialist Stop Time (ACUTE ONLY) 1419  Mobility Specialist Time Calculation (min) (ACUTE ONLY) 19 min   Ted hose, ace wraps, and abdominal binder placed prior to mobility. Required CGA to stand this date d/t dizziness. Returned pt supine to recover after orthostatic vitals and then repeated vitals; see below. Upon standing, pt feeling like dizziness will not subside; patient stated my head won't clear. Returned pt supine d/t BP, HR, and symptoms. Will attempt at a later time as appropriate.   Supine: BP: 140/93   HR: 98 bpm Sitting: BP: 131/94    HR: 105 bpm Standing: BP: 82/67  HR: 125 bpm Returned supine: BP: 102/88   Lummie Montijo Mobility Specialist Please contact via Special educational needs teacher or  Rehab office at [phone]

## 2024-05-28 NOTE — Plan of Care (Signed)

## 2024-05-28 NOTE — Progress Notes (Signed)
 [facility]  ROUNDING NOTE   Subjective:   Patient resting in bed Alert and oriented Able to ambulate yesterday.   Objective:  Vital signs in last 24 hours:  Temp:  [97.8 F (36.6 C)-98.2 F (36.8 C)] 98.1 F (36.7 C) (09/29 0321) Pulse Rate:  [92-104] 99 (09/29 0321) Resp:  [17-18] 17 (09/29 0321) BP: (120-143)/(88-107) 120/92 (09/29 0321) SpO2:  [96 %-100 %] 100 % (09/29 0321)  Weight change:  Filed Weights   05/24/24 0817 05/26/24 0815 05/26/24 1212  Weight: 112.2 kg 112.7 kg 112.7 kg    Intake/Output: I/O last 3 completed shifts: In: 740 [P.O.:740] Out: -    Intake/Output this shift:  No intake/output data recorded.  Physical Exam: General: NAD  Head: Normocephalic  Eyes: Anicteric  Lungs:  Clear, Room air  Heart: Regular rate   Abdomen:  Soft  Extremities:  no peripheral edema.  Neurologic: Alert  Skin: Intact  Access: Rt chest permcath    Basic Metabolic Panel: Recent Labs  Lab 05/22/24 0738 05/23/24 0518 05/24/24 0900 05/26/24 0830  NA  --  137  --  135  K  --  4.4  --  3.5  CL  --  101  --  96*  CO2  --  26  --  28  GLUCOSE  --  92  --  89  BUN  --  18  --  24*  CREATININE  --  7.11*  --  8.20*  CALCIUM   --  9.3  --  9.3  MG 2.0  --  2.1 2.1  PHOS 4.1 3.7 2.6 4.0  4.1    Liver Function Tests: Recent Labs  Lab 05/23/24 0518 05/26/24 0830  ALBUMIN  3.5 3.6   No results for input(s): LIPASE, AMYLASE in the last 168 hours. No results for input(s): AMMONIA in the last 168 hours.  CBC: Recent Labs  Lab 05/26/24 0830  WBC 6.6  HGB 10.4*  HCT 31.3*  MCV 94.6  PLT 281    Cardiac Enzymes: No results for input(s): CKTOTAL, CKMB, CKMBINDEX, TROPONINI in the last 168 hours.  BNP: Invalid input(s): POCBNP  CBG: No results for input(s): GLUCAP in the last 168 hours.   Microbiology: Results for orders placed or performed during the hospital encounter of 04/07/24  Resp panel by RT-PCR (RSV, Flu  A&B, Covid) Anterior Nasal Swab     Status: None   Collection Time: 04/07/24  4:46 PM   Specimen: Anterior Nasal Swab  Result Value Ref Range Status   SARS Coronavirus 2 by RT PCR NEGATIVE NEGATIVE Final    Comment: (NOTE) SARS-CoV-2 target nucleic acids are NOT DETECTED.  The SARS-CoV-2 RNA is generally detectable in upper respiratory specimens during the acute phase of infection. The lowest concentration of SARS-CoV-2 viral copies this assay can detect is 138 copies/mL. A negative result does not preclude SARS-Cov-2 infection and should not be used as the sole basis for treatment or other patient management decisions. A negative result may occur with  improper specimen collection/handling, submission of specimen other than nasopharyngeal swab, presence of viral mutation(s) within the areas targeted by this assay, and inadequate number of viral copies(<138 copies/mL). A negative result must be combined with clinical observations, patient history, and epidemiological information. The expected result is Negative.  Fact Sheet for Patients:  BloggerCourse.com  Fact Sheet for Healthcare Providers:  SeriousBroker.it  This test is no t yet approved or cleared by the [place]  FDA and  has been authorized for  detection and/or diagnosis of SARS-CoV-2 by FDA under an Emergency Use Authorization (EUA). This EUA will remain  in effect (meaning this test can be used) for the duration of the COVID-19 declaration under Section 564(b)(1) of the Act, 21 U.S.C.section 360bbb-3(b)(1), unless the authorization is terminated  or revoked sooner.       Influenza A by PCR NEGATIVE NEGATIVE Final   Influenza B by PCR NEGATIVE NEGATIVE Final    Comment: (NOTE) The Xpert Xpress SARS-CoV-2/FLU/RSV plus assay is intended as an aid in the diagnosis of influenza from Nasopharyngeal swab specimens and should not be used as a sole basis for treatment.  Nasal washings and aspirates are unacceptable for Xpert Xpress SARS-CoV-2/FLU/RSV testing.  Fact Sheet for Patients: BloggerCourse.com  Fact Sheet for Healthcare Providers: SeriousBroker.it  This test is not yet approved or cleared by the [place]  FDA and has been authorized for detection and/or diagnosis of SARS-CoV-2 by FDA under an Emergency Use Authorization (EUA). This EUA will remain in effect (meaning this test can be used) for the duration of the COVID-19 declaration under Section 564(b)(1) of the Act, 21 U.S.C. section 360bbb-3(b)(1), unless the authorization is terminated or revoked.     Resp Syncytial Virus by PCR NEGATIVE NEGATIVE Final    Comment: (NOTE) Fact Sheet for Patients: BloggerCourse.com  Fact Sheet for Healthcare Providers: SeriousBroker.it  This test is not yet approved or cleared by the [place]  FDA and has been authorized for detection and/or diagnosis of SARS-CoV-2 by FDA under an Emergency Use Authorization (EUA). This EUA will remain in effect (meaning this test can be used) for the duration of the COVID-19 declaration under Section 564(b)(1) of the Act, 21 U.S.C. section 360bbb-3(b)(1), unless the authorization is terminated or revoked.  Performed at [facility], [address] [place], [place] [address]     Coagulation Studies: No results for input(s): LABPROT, INR in the last 72 hours.  Urinalysis: No results for input(s): COLORURINE, LABSPEC, PHURINE, GLUCOSEU, HGBUR, BILIRUBINUR, KETONESUR, PROTEINUR, UROBILINOGEN, NITRITE, LEUKOCYTESUR in the last 72 hours.  Invalid input(s): APPERANCEUR    Imaging: No results found.   Medications:     bisacodyl   10 mg Oral QHS   buPROPion   300 mg Oral Daily   calcitRIOL   0.25 mcg Oral Daily   Chlorhexidine  Gluconate Cloth  6 each Topical Daily    cholecalciferol   1,000 Units Oral Daily   epoetin  alfa-epbx (RETACRIT ) injection  4,000 Units Intravenous Q T,Th,Sat-1800   fludrocortisone   0.3 mg Oral Daily   heparin   5,000 Units Subcutaneous Q8H   influenza vac split trivalent PF  0.5 mL Intramuscular Tomorrow-1000   midodrine   10 mg Oral TID WC   polyethylene glycol  17 g Oral BID   sevelamer  carbonate  1,600 mg Oral TID WC   venlafaxine  XR  150 mg Oral Q breakfast   And   venlafaxine  XR  75 mg Oral Q breakfast   acetaminophen , bisacodyl , butalbital -acetaminophen -caffeine , ondansetron  (ZOFRAN ) IV, ondansetron   Assessment/ Plan:  Brady Edwards is a 58 y.o.  male with a PMHx of hypertension, atrial fibrillation, BPH, hyperlipidemia, anxiety, constipation, morbid obesity, and end stage renal disease on hemodialysis.    [facility]- will need to resubmit for chair at discharge.    End stage renal disease on hemodialysis       Appreciate vascular performing permcath exchange on 9/11. Next treatment scheduled for Saturday.     2.  Anemia of chronic kidney disease. Hgb 10.4 at  last check, may consider holding retacrit  with next treatment     3.  Secondary hyperparathyroidism. PTH 199 during recent admission. Phos 3.1 today will continue Renvela  1 tabs TID.              Calcium  and phos remains stable.   4. Severe orthostatic hypotension Workup so far has included 2D echo from 04/11/2024 which shows LVEF 65 to 70%, normal LV systolic function, mild concentric LVH, normal diastolic parameters, normal right ventricular systolic function.  A.m. cortisol level normal from 04/24/2024.  TSH normal.  Continue TEDs,and abd binders.  Continue midodrine  10 mg 3 times a day along with Florinef  to 0.4mg  daily for blood pressure support. Patient reports able to ambulate in hall   LOS: [address] 9/29/202510:43 AM

## 2024-05-29 LAB — CBC
HCT: 34.5 % — ABNORMAL LOW (ref 39.0–52.0)
Hemoglobin: 11.2 g/dL — ABNORMAL LOW (ref 13.0–17.0)
MCH: 31.2 pg (ref 26.0–34.0)
MCHC: 32.5 g/dL (ref 30.0–36.0)
MCV: 96.1 fL (ref 80.0–100.0)
Platelets: 319 K/uL (ref 150–400)
RBC: 3.59 MIL/uL — ABNORMAL LOW (ref 4.22–5.81)
RDW: 14.4 % (ref 11.5–15.5)
WBC: 7.8 K/uL (ref 4.0–10.5)
nRBC: 0 % (ref 0.0–0.2)

## 2024-05-29 LAB — RENAL FUNCTION PANEL
Albumin: 3.9 g/dL (ref 3.5–5.0)
Anion gap: 15 (ref 5–15)
BUN: 22 mg/dL — ABNORMAL HIGH (ref 6–20)
CO2: 27 mmol/L (ref 22–32)
Calcium: 9.4 mg/dL (ref 8.9–10.3)
Chloride: 94 mmol/L — ABNORMAL LOW (ref 98–111)
Creatinine, Ser: 9.55 mg/dL — ABNORMAL HIGH (ref 0.61–1.24)
GFR, Estimated: 6 mL/min — ABNORMAL LOW (ref >60)
Glucose, Bld: 93 mg/dL (ref 70–99)
Phosphorus: 4.9 mg/dL — ABNORMAL HIGH (ref 2.5–4.6)
Potassium: 3.6 mmol/L (ref 3.5–5.1)
Sodium: 136 mmol/L (ref 135–145)

## 2024-05-29 LAB — PHOSPHORUS: Phosphorus: 4.8 mg/dL — ABNORMAL HIGH (ref 2.5–4.6)

## 2024-05-29 LAB — MAGNESIUM: Magnesium: 2.5 mg/dL — ABNORMAL HIGH (ref 1.7–2.4)

## 2024-05-29 MED ORDER — HEPARIN SODIUM (PORCINE) 1000 UNIT/ML IJ SOLN
INTRAMUSCULAR | Status: AC
  Filled 2024-05-29: qty 2

## 2024-05-29 MED ORDER — EPOETIN ALFA-EPBX 4000 UNIT/ML IJ SOLN
INTRAMUSCULAR | Status: AC
  Filled 2024-05-29: qty 1

## 2024-05-29 MED ORDER — HEPARIN SODIUM (PORCINE) 1000 UNIT/ML IJ SOLN
INTRAMUSCULAR | Status: AC
  Filled 2024-05-29: qty 4

## 2024-05-29 NOTE — Progress Notes (Signed)
 Mobility Specialist Progress Note:    05/29/24 1603  Orthostatic Lying   BP- Lying (!) 145/95  Pulse- Lying 81  Orthostatic Sitting  BP- Sitting 120/85  Pulse- Sitting 109  Orthostatic Standing at 0 minutes  BP- Standing at 0 minutes 92/68  Pulse- Standing at 0 minutes 135  Mobility  Activity Stood at bedside  Level of Assistance Modified independent, requires aide device or extra time  Assistive Device None  Range of Motion/Exercises Active;All extremities  Mobility visit 1 Mobility  Mobility Specialist Start Time (ACUTE ONLY) 1601  Mobility Specialist Stop Time (ACUTE ONLY) 1618  Mobility Specialist Time Calculation (min) (ACUTE ONLY) 17 min   Ted hose, ace wraps, and abdominal binder placed prior to mobility. Pt symptomatic this date but mainly experiencing nausea. Upon standing, pt has to return seated d/t nausea; frequent gagging for approximately 3-4 minutes but not followed with any emesis. Returned pt supine, HOB elevated. All needs met.  Sherrilee Ditty Mobility Specialist Please contact via Special educational needs teacher or  Rehab office at [phone]

## 2024-05-29 NOTE — Progress Notes (Signed)
 [facility]  ROUNDING NOTE   Subjective:   Patient seen and evaluated during dialysis   HEMODIALYSIS FLOWSHEET:  Blood Flow Rate (mL/min): 350 mL/min Arterial Pressure (mmHg): -203 mmHg Venous Pressure (mmHg): 150.31 mmHg TMP (mmHg): -6.46 mmHg Ultrafiltration Rate (mL/min): 530 mL/min Dialysate Flow Rate (mL/min): 300 ml/min Dialysis Fluid Bolus: Normal Saline Bolus Amount (mL): 300 mL  Tolerating treatment well seated in chair  Objective:  Vital signs in last 24 hours:  Temp:  [97.9 F (36.6 C)-99 F (37.2 C)] 98.2 F (36.8 C) (09/30 0737) Pulse Rate:  [71-108] 71 (09/30 1030) Resp:  [15-21] 16 (09/30 1030) BP: (108-151)/(79-108) 108/79 (09/30 1030) SpO2:  [98 %-100 %] 98 % (09/30 1030) Weight:  [111.9 kg] 111.9 kg (09/30 0737)  Weight change:  Filed Weights   05/26/24 0815 05/26/24 1212 05/29/24 0737  Weight: 112.7 kg 112.7 kg 111.9 kg    Intake/Output: No intake/output data recorded.   Intake/Output this shift:  No intake/output data recorded.  Physical Exam: General: NAD  Head: Normocephalic  Eyes: Anicteric  Lungs:  Clear, Room air  Heart: Regular rate   Abdomen:  Soft  Extremities:  no peripheral edema.  Neurologic: Alert  Skin: Intact  Access: Rt chest permcath    Basic Metabolic Panel: Recent Labs  Lab 05/23/24 0518 05/24/24 0900 05/26/24 0830 05/29/24 0745  NA 137  --  135 136  K 4.4  --  3.5 3.6  CL 101  --  96* 94*  CO2 26  --  28 27  GLUCOSE 92  --  89 93  BUN 18  --  24* 22*  CREATININE 7.11*  --  8.20* 9.55*  CALCIUM  9.3  --  9.3 9.4  MG  --  2.1 2.1 2.5*  PHOS 3.7 2.6 4.0  4.1 4.8*  4.9*    Liver Function Tests: Recent Labs  Lab 05/23/24 0518 05/26/24 0830 05/29/24 0745  ALBUMIN  3.5 3.6 3.9   No results for input(s): LIPASE, AMYLASE in the last 168 hours. No results for input(s): AMMONIA in the last 168 hours.  CBC: Recent Labs  Lab 05/26/24 0830 05/29/24 0745  WBC 6.6 7.8  HGB 10.4*  11.2*  HCT 31.3* 34.5*  MCV 94.6 96.1  PLT 281 319    Cardiac Enzymes: No results for input(s): CKTOTAL, CKMB, CKMBINDEX, TROPONINI in the last 168 hours.  BNP: Invalid input(s): POCBNP  CBG: No results for input(s): GLUCAP in the last 168 hours.   Microbiology: Results for orders placed or performed during the hospital encounter of 04/07/24  Resp panel by RT-PCR (RSV, Flu A&B, Covid) Anterior Nasal Swab     Status: None   Collection Time: 04/07/24  4:46 PM   Specimen: Anterior Nasal Swab  Result Value Ref Range Status   SARS Coronavirus 2 by RT PCR NEGATIVE NEGATIVE Final    Comment: (NOTE) SARS-CoV-2 target nucleic acids are NOT DETECTED.  The SARS-CoV-2 RNA is generally detectable in upper respiratory specimens during the acute phase of infection. The lowest concentration of SARS-CoV-2 viral copies this assay can detect is 138 copies/mL. A negative result does not preclude SARS-Cov-2 infection and should not be used as the sole basis for treatment or other patient management decisions. A negative result may occur with  improper specimen collection/handling, submission of specimen other than nasopharyngeal swab, presence of viral mutation(s) within the areas targeted by this assay, and inadequate number of viral copies(<138 copies/mL). A negative result must be combined with clinical observations, patient history,  and epidemiological information. The expected result is Negative.  Fact Sheet for Patients:  BloggerCourse.com  Fact Sheet for Healthcare Providers:  SeriousBroker.it  This test is no t yet approved or cleared by the [place]  FDA and  has been authorized for detection and/or diagnosis of SARS-CoV-2 by FDA under an Emergency Use Authorization (EUA). This EUA will remain  in effect (meaning this test can be used) for the duration of the COVID-19 declaration under Section 564(b)(1) of the Act,  21 U.S.C.section 360bbb-3(b)(1), unless the authorization is terminated  or revoked sooner.       Influenza A by PCR NEGATIVE NEGATIVE Final   Influenza B by PCR NEGATIVE NEGATIVE Final    Comment: (NOTE) The Xpert Xpress SARS-CoV-2/FLU/RSV plus assay is intended as an aid in the diagnosis of influenza from Nasopharyngeal swab specimens and should not be used as a sole basis for treatment. Nasal washings and aspirates are unacceptable for Xpert Xpress SARS-CoV-2/FLU/RSV testing.  Fact Sheet for Patients: BloggerCourse.com  Fact Sheet for Healthcare Providers: SeriousBroker.it  This test is not yet approved or cleared by the [place]  FDA and has been authorized for detection and/or diagnosis of SARS-CoV-2 by FDA under an Emergency Use Authorization (EUA). This EUA will remain in effect (meaning this test can be used) for the duration of the COVID-19 declaration under Section 564(b)(1) of the Act, 21 U.S.C. section 360bbb-3(b)(1), unless the authorization is terminated or revoked.     Resp Syncytial Virus by PCR NEGATIVE NEGATIVE Final    Comment: (NOTE) Fact Sheet for Patients: BloggerCourse.com  Fact Sheet for Healthcare Providers: SeriousBroker.it  This test is not yet approved or cleared by the [place]  FDA and has been authorized for detection and/or diagnosis of SARS-CoV-2 by FDA under an Emergency Use Authorization (EUA). This EUA will remain in effect (meaning this test can be used) for the duration of the COVID-19 declaration under Section 564(b)(1) of the Act, 21 U.S.C. section 360bbb-3(b)(1), unless the authorization is terminated or revoked.  Performed at [facility], [address] [place], [place] [address]     Coagulation Studies: No results for input(s): LABPROT, INR in the last 72 hours.  Urinalysis: No results for input(s):  COLORURINE, LABSPEC, PHURINE, GLUCOSEU, HGBUR, BILIRUBINUR, KETONESUR, PROTEINUR, UROBILINOGEN, NITRITE, LEUKOCYTESUR in the last 72 hours.  Invalid input(s): APPERANCEUR    Imaging: No results found.   Medications:     bisacodyl   10 mg Oral QHS   buPROPion   300 mg Oral Daily   calcitRIOL   0.25 mcg Oral Daily   Chlorhexidine  Gluconate Cloth  6 each Topical Daily   cholecalciferol   1,000 Units Oral Daily   epoetin  alfa-epbx (RETACRIT ) injection  4,000 Units Intravenous Q T,Th,Sat-1800   fludrocortisone   0.3 mg Oral Daily   heparin   5,000 Units Subcutaneous Q8H   influenza vac split trivalent PF  0.5 mL Intramuscular Tomorrow-1000   midodrine   15 mg Oral TID WC   polyethylene glycol  17 g Oral BID   sevelamer  carbonate  1,600 mg Oral TID WC   venlafaxine  XR  150 mg Oral Q breakfast   And   venlafaxine  XR  75 mg Oral Q breakfast   acetaminophen , bisacodyl , butalbital -acetaminophen -caffeine , ondansetron  (ZOFRAN ) IV, ondansetron   Assessment/ Plan:  Brady Edwards is a 58 y.o.  male with a PMHx of hypertension, atrial fibrillation, BPH, hyperlipidemia, anxiety, constipation, morbid obesity, and end stage renal disease on hemodialysis.    [facility]- will need to resubmit for  chair at discharge.    End stage renal disease on hemodialysis       Appreciate vascular performing permcath exchange on 9/11. Receiving dialysis today, UF 0.5L as tolerated. Next treatment scheduled for Thursday. Access functioning well     2.  Anemia of chronic kidney disease. Hgb 11.2, will hold Retacrit .      3.  Secondary hyperparathyroidism. PTH 199 during recent admission. Phos 3.1 today will continue Renvela  1 tabs TID.              Calcium  and phos remains stable.   4. Severe orthostatic hypotension Workup so far has included 2D echo from 04/11/2024 which shows LVEF 65 to 70%, normal LV systolic function, mild concentric LVH, normal diastolic parameters,  normal right ventricular systolic function.  A.m. cortisol level normal from 04/24/2024.  TSH normal.  Continue TEDs,and abd binders.  Continue midodrine  10 mg 3 times a day along with Florinef  to 0.4mg  daily for blood pressure support. Patient reports able to ambulate in hall   LOS: 40 Kathaleen Dudziak 9/30/202510:43 AM

## 2024-05-29 NOTE — Progress Notes (Signed)
 NT attempted to do Orthostatic Vital on pt. Pt refused stating he only gets them done on day shift with PT.

## 2024-05-29 NOTE — Plan of Care (Signed)

## 2024-05-29 NOTE — Progress Notes (Signed)
 Progress Note   [name] FMW:[id number] DOB: [birth year] DOA: 04/15/2024     40 DOS: the patient was seen and examined on 05/29/2024   Brief hospital course: 58 y.o. male with medical history significant of newly ESRD recently started on HD, atrial fibrillation, anxiety and depression, morbid obesity who presented with complaints of weakness and falls.   Pt was very frustrated with his medical and mental problems, and wanted us  to make him better.  Pt complained of fatigue, lack of energy, and weakness, causing to fall several times since he was discharged from hospital on 04/13/24.  No focal neuro deficits.  Pt said he has had long standing mental health issues and needs to see psych.  Pt complained of constipation for the past 2 months that wasn't addressed during his last hospitalization.     Of note, pt was hospitalized from 04/07/24 to 04/13/24 during which he was started on dialysis, and was discharged after dialysis on 8/15 to continue outpatient dialysis TTS.   ED Course: initial vitals: afebrile, pulse 105, BP 86/65, RR 12, sating 99% on room air.  Labs consistent with ESRD.  CXR no acute finding.  EKG showed sinus tachycardia.  CT head no acute finding.  ED provider reported pt said he would be better off not living at this point but does not have a plan for suicide and is going to receive help.  Psych consult placed.  Nephrology consultation.  9/24.  Patient's been here 38 days.  Still has orthostatic hypotension.  Patient on midodrine  and Florinef . 9/25.  Blood pressure stable while on dialysis.  Had to keep his head a certain way so the catheter will work. 9/29.  Patient still orthostatic dropping blood pressure down into the 80s and 90s with standing.  Increase midodrine  to 15 mg 3 times daily  Assessment and Plan: * Orthostatic hypotension Increased midodrine  3 times daily to 15 mg and on Florinef  0.3 mg daily.  A.m. cortisol was normal.  Patient still drops his blood  pressure quite a bit when he stands up.  Continue TED hose.  Continue working with physical therapy  General weakness Continue working with physical therapy  ESRD (end stage renal disease) (HCC) Hemodialysis as per nephrology  Anemia of chronic disease Last hemoglobin 11.2  Depression On Wellbutrin  and Effexor   Vitamin D  deficiency Supplement vitamin D   History of atrial fibrillation Not on anticoagulation.  Holding Toprol  with low blood pressure.  Sinus arrhythmia.  Obesity (BMI 30-39.9) BMI 37.44  Clotted dialysis access PermCath exchange by vascular surgery on 9/11.  Catheter seems to be positional.        Subjective: Patient states he is going to meet with somebody about placement on Friday.  Hopefully he can fill out the paperwork and get something arranged.  Patient admitted 43 days ago with weakness and falls.  Found to have orthostatic hypotension  Physical Exam: Vitals:   05/29/24 1100 05/29/24 1105 05/29/24 1115 05/29/24 1259  BP: (!) 132/92 123/73 132/86 (!) 135/91  Pulse: 82 84 82 90  Resp: 16 19 19 16   Temp:  97.9 F (36.6 C)  98 F (36.7 C)  TempSrc:  Oral  Oral  SpO2: 99% 98% 98%   Weight:  111.7 kg    Height:       Physical Exam HENT:     Head: Normocephalic.  Eyes:     General: Lids are normal.     Conjunctiva/sclera: Conjunctivae normal.  Cardiovascular:  Rate and Rhythm: Normal rate and regular rhythm.     Heart sounds: Normal heart sounds, S1 normal and S2 normal.  Pulmonary:     Breath sounds: No decreased breath sounds, wheezing, rhonchi or rales.  Abdominal:     Palpations: Abdomen is soft.     Tenderness: There is no abdominal tenderness.  Musculoskeletal:     Right lower leg: No swelling.     Left lower leg: No swelling.  Skin:    General: Skin is warm.     Findings: No rash.  Neurological:     Mental Status: He is alert and oriented to person, place, and time.     Data Reviewed: Creatinine 9.55, magnesium 2.5,  phosphorus 4.9, potassium 3.6, white blood cell count 7.8, hemoglobin 11.2, platelet count 319  Disposition: Status is: Inpatient Remains inpatient appropriate because: Patient states he set up a meeting for Friday with a person that will help out with placement hopefully.  He will fill out paperwork on Friday.  Planned Discharge Destination: Some sort of placement    Time spent: 28 minutes  Author: Charlie Patterson, MD 05/29/2024 2:35 PM  For on call review [email or web address].

## 2024-05-29 NOTE — Progress Notes (Signed)
 Hemodialysis Note:  Received patient in bed to unit. Alert and oriented. Informed consent singed and in chart.  Treatment initiated: 0751 Treatment completed: 1105  Access used: Right internal jugular catheter Access issues: Rinse back 38 minutes early due to system clot  Patient tolerated well. Transported back to room, alert without acute distress. Report given to patient's RN.  Total UF removed: 100 ml Medications given: Retacrit  4000 units IV  Post HD weight: 111.7 Kg  [name]

## 2024-05-30 DIAGNOSIS — D638 Anemia in other chronic diseases classified elsewhere: Secondary | ICD-10-CM | POA: Diagnosis not present

## 2024-05-30 DIAGNOSIS — I951 Orthostatic hypotension: Secondary | ICD-10-CM | POA: Diagnosis not present

## 2024-05-30 DIAGNOSIS — N186 End stage renal disease: Secondary | ICD-10-CM | POA: Diagnosis not present

## 2024-05-30 NOTE — Progress Notes (Signed)
 Mobility Specialist Progress Note:    05/30/24 1231  Orthostatic Lying   BP- Lying (!) 144/93  Pulse- Lying 100  Orthostatic Sitting  BP- Sitting 103/77  Pulse- Sitting 105  Orthostatic Standing at 0 minutes  BP- Standing at 0 minutes (!) 88/65  Pulse- Standing at 0 minutes 124  Orthostatic Standing at 3 minutes  BP- Standing at 3 minutes (!) 70/58  Pulse- Standing at 3 minutes 131  Mobility  Activity Ambulated with assistance;Stood at bedside  Level of Assistance Modified independent, requires aide device or extra time  Assistive Device None  Distance Ambulated (ft) 350 ft  Range of Motion/Exercises Active;All extremities  Activity Response Tolerated well  Mobility visit 1 Mobility  Mobility Specialist Start Time (ACUTE ONLY) 1200  Mobility Specialist Stop Time (ACUTE ONLY) 1231  Mobility Specialist Time Calculation (min) (ACUTE ONLY) 31 min   Ted hose, ace wraps, and abdominal binder placed prior to mobility. Modi to stand and ambulate. Pt feeling mild symptoms today, mainly when standing EOB. After last vital sign was taken (70/58) I returned pt sitting to recover. BP recovered to 135/106 and HR 100 bpm sitting. Upon standing, BP 103/69 and HR 104 bpm, pt asx. Able to ambulate at this point. Returned to room, BP 122/92 and HR 123 bpm after ambulation. Notified RN of session details, all needs were met.  Sherrilee Ditty Mobility Specialist Please contact via Special educational needs teacher or  Rehab office at [phone]

## 2024-05-30 NOTE — Progress Notes (Signed)
 [facility]  ROUNDING NOTE   Subjective:   Patient seen resting in bed No complaints to offer  Objective:  Vital signs in last 24 hours:  Temp:  [97.8 F (36.6 C)-98.7 F (37.1 C)] 98.7 F (37.1 C) (10/01 0806) Pulse Rate:  [87-106] 91 (10/01 0806) Resp:  [17-20] 17 (10/01 0806) BP: (118-149)/(93-95) 144/93 (10/01 1211) SpO2:  [97 %-99 %] 99 % (10/01 0806)  Weight change:  Filed Weights   05/26/24 1212 05/29/24 0737 05/29/24 1105  Weight: 112.7 kg 111.9 kg 111.7 kg    Intake/Output: I/O last 3 completed shifts: In: 240 [P.O.:240] Out: 100 [Other:100]   Intake/Output this shift:  Total I/O In: 120 [P.O.:120] Out: -   Physical Exam: General: NAD  Head: Normocephalic  Eyes: Anicteric  Lungs:  Clear, Room air  Heart: Regular rate   Abdomen:  Soft  Extremities:  no peripheral edema.  Neurologic: Alert  Skin: Intact  Access: Rt chest permcath    Basic Metabolic Panel: Recent Labs  Lab 05/24/24 0900 05/26/24 0830 05/29/24 0745  NA  --  135 136  K  --  3.5 3.6  CL  --  96* 94*  CO2  --  28 27  GLUCOSE  --  89 93  BUN  --  24* 22*  CREATININE  --  8.20* 9.55*  CALCIUM   --  9.3 9.4  MG 2.1 2.1 2.5*  PHOS 2.6 4.0  4.1 4.8*  4.9*    Liver Function Tests: Recent Labs  Lab 05/26/24 0830 05/29/24 0745  ALBUMIN  3.6 3.9   No results for input(s): LIPASE, AMYLASE in the last 168 hours. No results for input(s): AMMONIA in the last 168 hours.  CBC: Recent Labs  Lab 05/26/24 0830 05/29/24 0745  WBC 6.6 7.8  HGB 10.4* 11.2*  HCT 31.3* 34.5*  MCV 94.6 96.1  PLT 281 319    Cardiac Enzymes: No results for input(s): CKTOTAL, CKMB, CKMBINDEX, TROPONINI in the last 168 hours.  BNP: Invalid input(s): POCBNP  CBG: No results for input(s): GLUCAP in the last 168 hours.   Microbiology: Results for orders placed or performed during the hospital encounter of 04/07/24  Resp panel by RT-PCR (RSV, Flu A&B, Covid)  Anterior Nasal Swab     Status: None   Collection Time: 04/07/24  4:46 PM   Specimen: Anterior Nasal Swab  Result Value Ref Range Status   SARS Coronavirus 2 by RT PCR NEGATIVE NEGATIVE Final    Comment: (NOTE) SARS-CoV-2 target nucleic acids are NOT DETECTED.  The SARS-CoV-2 RNA is generally detectable in upper respiratory specimens during the acute phase of infection. The lowest concentration of SARS-CoV-2 viral copies this assay can detect is 138 copies/mL. A negative result does not preclude SARS-Cov-2 infection and should not be used as the sole basis for treatment or other patient management decisions. A negative result may occur with  improper specimen collection/handling, submission of specimen other than nasopharyngeal swab, presence of viral mutation(s) within the areas targeted by this assay, and inadequate number of viral copies(<138 copies/mL). A negative result must be combined with clinical observations, patient history, and epidemiological information. The expected result is Negative.  Fact Sheet for Patients:  BloggerCourse.com  Fact Sheet for Healthcare Providers:  SeriousBroker.it  This test is no t yet approved or cleared by the [place]  FDA and  has been authorized for detection and/or diagnosis of SARS-CoV-2 by FDA under an Emergency Use Authorization (EUA). This EUA will remain  in effect (  meaning this test can be used) for the duration of the COVID-19 declaration under Section 564(b)(1) of the Act, 21 U.S.C.section 360bbb-3(b)(1), unless the authorization is terminated  or revoked sooner.       Influenza A by PCR NEGATIVE NEGATIVE Final   Influenza B by PCR NEGATIVE NEGATIVE Final    Comment: (NOTE) The Xpert Xpress SARS-CoV-2/FLU/RSV plus assay is intended as an aid in the diagnosis of influenza from Nasopharyngeal swab specimens and should not be used as a sole basis for treatment. Nasal washings  and aspirates are unacceptable for Xpert Xpress SARS-CoV-2/FLU/RSV testing.  Fact Sheet for Patients: BloggerCourse.com  Fact Sheet for Healthcare Providers: SeriousBroker.it  This test is not yet approved or cleared by the [place]  FDA and has been authorized for detection and/or diagnosis of SARS-CoV-2 by FDA under an Emergency Use Authorization (EUA). This EUA will remain in effect (meaning this test can be used) for the duration of the COVID-19 declaration under Section 564(b)(1) of the Act, 21 U.S.C. section 360bbb-3(b)(1), unless the authorization is terminated or revoked.     Resp Syncytial Virus by PCR NEGATIVE NEGATIVE Final    Comment: (NOTE) Fact Sheet for Patients: BloggerCourse.com  Fact Sheet for Healthcare Providers: SeriousBroker.it  This test is not yet approved or cleared by the [place]  FDA and has been authorized for detection and/or diagnosis of SARS-CoV-2 by FDA under an Emergency Use Authorization (EUA). This EUA will remain in effect (meaning this test can be used) for the duration of the COVID-19 declaration under Section 564(b)(1) of the Act, 21 U.S.C. section 360bbb-3(b)(1), unless the authorization is terminated or revoked.  Performed at [facility], [address] [place], [place] [address]     Coagulation Studies: No results for input(s): LABPROT, INR in the last 72 hours.  Urinalysis: No results for input(s): COLORURINE, LABSPEC, PHURINE, GLUCOSEU, HGBUR, BILIRUBINUR, KETONESUR, PROTEINUR, UROBILINOGEN, NITRITE, LEUKOCYTESUR in the last 72 hours.  Invalid input(s): APPERANCEUR    Imaging: No results found.   Medications:     bisacodyl   10 mg Oral QHS   buPROPion   300 mg Oral Daily   calcitRIOL   0.25 mcg Oral Daily   Chlorhexidine  Gluconate Cloth  6 each Topical Daily    cholecalciferol   1,000 Units Oral Daily   fludrocortisone   0.3 mg Oral Daily   heparin   5,000 Units Subcutaneous Q8H   influenza vac split trivalent PF  0.5 mL Intramuscular Tomorrow-1000   midodrine   15 mg Oral TID WC   polyethylene glycol  17 g Oral BID   sevelamer  carbonate  1,600 mg Oral TID WC   venlafaxine  XR  150 mg Oral Q breakfast   And   venlafaxine  XR  75 mg Oral Q breakfast   acetaminophen , bisacodyl , butalbital -acetaminophen -caffeine , ondansetron  (ZOFRAN ) IV, ondansetron   Assessment/ Plan:  [name] is a 58 y.o.  male with a PMHx of hypertension, atrial fibrillation, BPH, hyperlipidemia, anxiety, constipation, morbid obesity, and end stage renal disease on hemodialysis.    [facility]- will need to resubmit for chair at discharge.    End stage renal disease on hemodialysis       Appreciate vascular performing permcath exchange on 9/11.  Next treatment scheduled for Thursday.    2.  Anemia of chronic kidney disease. Hgb 11.2, will hold ESA until hemoglobin is lower than 10.     3.  Secondary hyperparathyroidism. PTH 199 during recent admission. Phos 3.1 today will continue Renvela  1 tabs TID.  Will continue to monitor labs during this admission.   4. Severe orthostatic hypotension Workup so far has included 2D echo from 04/11/2024 which shows LVEF 65 to 70%, normal LV systolic function, mild concentric LVH, normal diastolic parameters, normal right ventricular systolic function.  A.m. cortisol level normal from 04/24/2024.  TSH normal.  Continue TEDs,and abd binders.  Continue midodrine  10 mg 3 times a day along with Florinef  to 0.4mg  daily for blood pressure support.     LOS: 41 Jhaniya Briski 10/1/20251:24 PM

## 2024-05-30 NOTE — Progress Notes (Signed)
  Progress Note   [name] FMW:[id number] DOB: 1965-10-31 DOA: 04/15/2024     41 DOS: the patient was seen and examined on 05/30/2024   Brief hospital course: 58 y.o. male with medical history significant of newly ESRD recently started on HD, atrial fibrillation, anxiety and depression, morbid obesity who presented with complaints of weakness and falls. Of note, pt was hospitalized from 04/07/24 to 04/13/24 during which he was started on dialysis, and was discharged after dialysis on 8/15 to continue outpatient dialysis TTS. Patient was found has severe orthostatic hypotension, was started on high-dose midodrine .  Currently pending nursing home placement   Principal Problem:   Orthostatic hypotension Active Problems:   General weakness   ESRD (end stage renal disease) (HCC)   Anemia of chronic disease   Depression   Clotted dialysis access   Obesity (BMI 30-39.9)   History of atrial fibrillation   Vitamin D  deficiency   Assessment and Plan: Orthostatic hypotension Increased midodrine  3 times daily to 15 mg and on Florinef  0.3 mg daily.  A.m. cortisol was normal.  Patient still drops his blood pressure quite a bit when he stands up.  Continue TED hose.  Condition relatively stable, patient has less dizziness with walking.  Currently pending nursing placement.   General weakness Continue working with physical therapy   ESRD (end stage renal disease) (HCC) Hemodialysis as per nephrology   Anemia of chronic disease Hb stable.  Depression On Wellbutrin  and Effexor    Vitamin D  deficiency Supplement vitamin D    History of atrial fibrillation Not on anticoagulation.  Holding Toprol  with low blood pressure.  Sinus arrhythmia.   Obesity (BMI 30-39.9) class II. BMI 37.44   Clotted dialysis access PermCath exchange by vascular surgery on 9/11.  Catheter seems to be positional      Subjective:  Doing well, has some dizziness with walking.  Physical  Exam: Vitals:   05/29/24 1538 05/30/24 0438 05/30/24 0806 05/30/24 1211  BP: (!) 149/94 (!) 118/94 (!) 123/95 (!) 144/93  Pulse: 87 (!) 106 91   Resp: 20 18 17    Temp: 98.4 F (36.9 C) 97.8 F (36.6 C) 98.7 F (37.1 C)   TempSrc:  Oral Oral   SpO2: 98% 97% 99%   Weight:      Height:       General exam: Appears calm and comfortable  Respiratory system: Clear to auscultation. Respiratory effort normal. Cardiovascular system: S1 & S2 heard, RRR. No JVD, murmurs, rubs, gallops or clicks. No pedal edema. Gastrointestinal system: Abdomen is nondistended, soft and nontender. No organomegaly or masses felt. Normal bowel sounds heard. Central nervous system: Alert and oriented. No focal neurological deficits. Extremities: Symmetric 5 x 5 power. Skin: No rashes, lesions or ulcers Psychiatry: Judgement and insight appear normal. Mood & affect appropriate.    Data Reviewed:  Lab results reviewed  Family Communication: None  Disposition: Status is: Inpatient Remains inpatient appropriate because: Unsafe discharge pending nursing home placement.     Time spent: 35 minutes  Author: Murvin Mana, MD 05/30/2024 1:32 PM  For on call review [email or web address].

## 2024-05-31 DIAGNOSIS — I951 Orthostatic hypotension: Secondary | ICD-10-CM | POA: Diagnosis not present

## 2024-05-31 DIAGNOSIS — N186 End stage renal disease: Secondary | ICD-10-CM | POA: Diagnosis not present

## 2024-05-31 LAB — CBC
HCT: 35 % — ABNORMAL LOW (ref 39.0–52.0)
Hemoglobin: 11.2 g/dL — ABNORMAL LOW (ref 13.0–17.0)
MCH: 31 pg (ref 26.0–34.0)
MCHC: 32 g/dL (ref 30.0–36.0)
MCV: 97 fL (ref 80.0–100.0)
Platelets: 298 K/uL (ref 150–400)
RBC: 3.61 MIL/uL — ABNORMAL LOW (ref 4.22–5.81)
RDW: 14.5 % (ref 11.5–15.5)
WBC: 7.7 K/uL (ref 4.0–10.5)
nRBC: 0 % (ref 0.0–0.2)

## 2024-05-31 LAB — RENAL FUNCTION PANEL
Albumin: 3.8 g/dL (ref 3.5–5.0)
Anion gap: 11 (ref 5–15)
BUN: 17 mg/dL (ref 6–20)
CO2: 25 mmol/L (ref 22–32)
Calcium: 9.5 mg/dL (ref 8.9–10.3)
Chloride: 98 mmol/L (ref 98–111)
Creatinine, Ser: 9.13 mg/dL — ABNORMAL HIGH (ref 0.61–1.24)
GFR, Estimated: 6 mL/min — ABNORMAL LOW (ref >60)
Glucose, Bld: 105 mg/dL — ABNORMAL HIGH (ref 70–99)
Phosphorus: 3.7 mg/dL (ref 2.5–4.6)
Potassium: 3.4 mmol/L — ABNORMAL LOW (ref 3.5–5.1)
Sodium: 134 mmol/L — ABNORMAL LOW (ref 135–145)

## 2024-05-31 LAB — HEPATITIS B SURFACE ANTIGEN: Hepatitis B Surface Ag: NONREACTIVE

## 2024-05-31 MED ORDER — HEPARIN SODIUM (PORCINE) 1000 UNIT/ML DIALYSIS: 1000.0000 [IU] | INTRAMUSCULAR | Status: DC | PRN

## 2024-05-31 MED ORDER — ALTEPLASE 2 MG IJ SOLR
2.0000 mg | Freq: Once | INTRAMUSCULAR | Status: AC | PRN
  Administered 2024-05-31: 2 mg

## 2024-05-31 MED ORDER — ALTEPLASE 2 MG IJ SOLR: 2.0000 mg | Freq: Once | INTRAMUSCULAR | Status: DC | PRN

## 2024-05-31 MED ORDER — ALTEPLASE 2 MG IJ SOLR
INTRAMUSCULAR | Status: AC
  Filled 2024-05-31: qty 2

## 2024-05-31 MED ORDER — HEPARIN SODIUM (PORCINE) 1000 UNIT/ML IJ SOLN
INTRAMUSCULAR | Status: AC
  Filled 2024-05-31: qty 7

## 2024-05-31 MED ORDER — STERILE WATER FOR INJECTION IJ SOLN
INTRAMUSCULAR | Status: AC
  Filled 2024-05-31: qty 10

## 2024-05-31 MED ORDER — HEPARIN SODIUM (PORCINE) 1000 UNIT/ML DIALYSIS: 25.0000 [IU]/kg | INTRAMUSCULAR | Status: DC | PRN

## 2024-05-31 NOTE — Progress Notes (Signed)
 Submitted information to assist with HD treatment at [facility]. Navigator following to assist with any HD needs.   Suzen Satchel  Dialysis Navigator  [phone]

## 2024-05-31 NOTE — Progress Notes (Signed)
 [facility]  ROUNDING NOTE   Subjective:   Patient seen and evaluated during dialysis   HEMODIALYSIS FLOWSHEET:  Blood Flow Rate (mL/min): 230 mL/min Arterial Pressure (mmHg): -151.28 mmHg Venous Pressure (mmHg): 87.47 mmHg TMP (mmHg): 23.63 mmHg Ultrafiltration Rate (mL/min): 547 mL/min Dialysate Flow Rate (mL/min): 0 ml/min Dialysis Fluid Bolus: Normal Saline Bolus Amount (mL): 300 mL  Multiple alarms from permcath Currently receiving cathflo dwell  Objective:  Vital signs in last 24 hours:  Temp:  [97.9 F (36.6 C)-98.8 F (37.1 C)] 97.9 F (36.6 C) (10/02 0824) Pulse Rate:  [55-98] 91 (10/02 1008) Resp:  [16-22] 18 (10/02 1008) BP: (119-163)/(91-117) 136/99 (10/02 1000) SpO2:  [96 %-99 %] 97 % (10/02 1008) Weight:  [111.4 kg] 111.4 kg (10/02 0824)  Weight change:  Filed Weights   05/29/24 0737 05/29/24 1105 05/31/24 0824  Weight: 111.9 kg 111.7 kg 111.4 kg    Intake/Output: I/O last 3 completed shifts: In: 360 [P.O.:360] Out: -    Intake/Output this shift:  No intake/output data recorded.  Physical Exam: General: NAD  Head: Normocephalic  Eyes: Anicteric  Lungs:  Clear, Room air  Heart: Regular rate   Abdomen:  Soft  Extremities:  no peripheral edema.  Neurologic: Alert  Skin: Intact  Access: Rt chest permcath    Basic Metabolic Panel: Recent Labs  Lab 05/26/24 0830 05/29/24 0745  NA 135 136  K 3.5 3.6  CL 96* 94*  CO2 28 27  GLUCOSE 89 93  BUN 24* 22*  CREATININE 8.20* 9.55*  CALCIUM  9.3 9.4  MG 2.1 2.5*  PHOS 4.0  4.1 4.8*  4.9*    Liver Function Tests: Recent Labs  Lab 05/26/24 0830 05/29/24 0745  ALBUMIN  3.6 3.9   No results for input(s): LIPASE, AMYLASE in the last 168 hours. No results for input(s): AMMONIA in the last 168 hours.  CBC: Recent Labs  Lab 05/26/24 0830 05/29/24 0745  WBC 6.6 7.8  HGB 10.4* 11.2*  HCT 31.3* 34.5*  MCV 94.6 96.1  PLT 281 319    Cardiac Enzymes: No results  for input(s): CKTOTAL, CKMB, CKMBINDEX, TROPONINI in the last 168 hours.  BNP: Invalid input(s): POCBNP  CBG: No results for input(s): GLUCAP in the last 168 hours.   Microbiology: Results for orders placed or performed during the hospital encounter of 04/07/24  Resp panel by RT-PCR (RSV, Flu A&B, Covid) Anterior Nasal Swab     Status: None   Collection Time: 04/07/24  4:46 PM   Specimen: Anterior Nasal Swab  Result Value Ref Range Status   SARS Coronavirus 2 by RT PCR NEGATIVE NEGATIVE Final    Comment: (NOTE) SARS-CoV-2 target nucleic acids are NOT DETECTED.  The SARS-CoV-2 RNA is generally detectable in upper respiratory specimens during the acute phase of infection. The lowest concentration of SARS-CoV-2 viral copies this assay can detect is 138 copies/mL. A negative result does not preclude SARS-Cov-2 infection and should not be used as the sole basis for treatment or other patient management decisions. A negative result may occur with  improper specimen collection/handling, submission of specimen other than nasopharyngeal swab, presence of viral mutation(s) within the areas targeted by this assay, and inadequate number of viral copies(<138 copies/mL). A negative result must be combined with clinical observations, patient history, and epidemiological information. The expected result is Negative.  Fact Sheet for Patients:  BloggerCourse.com  Fact Sheet for Healthcare Providers:  SeriousBroker.it  This test is no t yet approved or cleared by the [place]  [place] FDA and  has been authorized for detection and/or diagnosis of SARS-CoV-2 by FDA under an Emergency Use Authorization (EUA). This EUA will remain  in effect (meaning this test can be used) for the duration of the COVID-19 declaration under Section 564(b)(1) of the Act, 21 U.S.C.section 360bbb-3(b)(1), unless the authorization is terminated  or revoked  sooner.       Influenza A by PCR NEGATIVE NEGATIVE Final   Influenza B by PCR NEGATIVE NEGATIVE Final    Comment: (NOTE) The Xpert Xpress SARS-CoV-2/FLU/RSV plus assay is intended as an aid in the diagnosis of influenza from Nasopharyngeal swab specimens and should not be used as a sole basis for treatment. Nasal washings and aspirates are unacceptable for Xpert Xpress SARS-CoV-2/FLU/RSV testing.  Fact Sheet for Patients: BloggerCourse.com  Fact Sheet for Healthcare Providers: SeriousBroker.it  This test is not yet approved or cleared by the [place]  FDA and has been authorized for detection and/or diagnosis of SARS-CoV-2 by FDA under an Emergency Use Authorization (EUA). This EUA will remain in effect (meaning this test can be used) for the duration of the COVID-19 declaration under Section 564(b)(1) of the Act, 21 U.S.C. section 360bbb-3(b)(1), unless the authorization is terminated or revoked.     Resp Syncytial Virus by PCR NEGATIVE NEGATIVE Final    Comment: (NOTE) Fact Sheet for Patients: BloggerCourse.com  Fact Sheet for Healthcare Providers: SeriousBroker.it  This test is not yet approved or cleared by the [place]  FDA and has been authorized for detection and/or diagnosis of SARS-CoV-2 by FDA under an Emergency Use Authorization (EUA). This EUA will remain in effect (meaning this test can be used) for the duration of the COVID-19 declaration under Section 564(b)(1) of the Act, 21 U.S.C. section 360bbb-3(b)(1), unless the authorization is terminated or revoked.  Performed at [facility], [address] [place], [place] [address]     Coagulation Studies: No results for input(s): LABPROT, INR in the last 72 hours.  Urinalysis: No results for input(s): COLORURINE, LABSPEC, PHURINE, GLUCOSEU, HGBUR, BILIRUBINUR, KETONESUR,  PROTEINUR, UROBILINOGEN, NITRITE, LEUKOCYTESUR in the last 72 hours.  Invalid input(s): APPERANCEUR    Imaging: No results found.   Medications:     bisacodyl   10 mg Oral QHS   buPROPion   300 mg Oral Daily   calcitRIOL   0.25 mcg Oral Daily   Chlorhexidine  Gluconate Cloth  6 each Topical Daily   cholecalciferol   1,000 Units Oral Daily   fludrocortisone   0.3 mg Oral Daily   heparin   5,000 Units Subcutaneous Q8H   influenza vac split trivalent PF  0.5 mL Intramuscular Tomorrow-1000   midodrine   15 mg Oral TID WC   polyethylene glycol  17 g Oral BID   sevelamer  carbonate  1,600 mg Oral TID WC   venlafaxine  XR  150 mg Oral Q breakfast   And   venlafaxine  XR  75 mg Oral Q breakfast   acetaminophen , alteplase , alteplase , bisacodyl , butalbital -acetaminophen -caffeine , heparin , heparin , heparin , heparin , heparin , ondansetron  (ZOFRAN ) IV, ondansetron   Assessment/ Plan:  [name] is a 58 y.o.  male with a PMHx of hypertension, atrial fibrillation, BPH, hyperlipidemia, anxiety, constipation, morbid obesity, and end stage renal disease on hemodialysis.    [facility]- will need to resubmit for chair at discharge.    End stage renal disease on hemodialysis       Appreciate vascular performing permcath exchange on 9/11.  Receiving treatment today, UF 0.5L. Unsuccessful cathflo dwell, continues to have multiple arterial and venous  alarms. Requested vascular evaluate for possible exchange. Dialysis navigator will seek outpatient clinic placement at [facility].     2.  Anemia of chronic kidney disease. Hgb 11.2, awaiting am labs     3.  Secondary hyperparathyroidism. PTH 199 during recent admission. Phos 3.1 today will continue Renvela  1 tabs TID.             Will continue to monitor labs during this admission.   4. Severe orthostatic hypotension Workup so far has included 2D echo from 04/11/2024 which shows LVEF 65 to 70%, normal LV systolic function, mild  concentric LVH, normal diastolic parameters, normal right ventricular systolic function.  A.m. cortisol level normal from 04/24/2024.  TSH normal.  Continue TEDs,and abd binders.  Continue midodrine  10 mg 3 times a day along with Florinef  to 0.4mg  daily for blood pressure support.     LOS: 42 Brady Edwards 10/2/202511:12 AM

## 2024-05-31 NOTE — Progress Notes (Signed)
  Progress Note   [name] FMW:[id number] DOB: Apr 19, 1966 DOA: 04/15/2024     42 DOS: the patient was seen and examined on 05/31/2024   Brief hospital course: 58 y.o. male with medical history significant of newly ESRD recently started on HD, atrial fibrillation, anxiety and depression, morbid obesity who presented with complaints of weakness and falls. Of note, pt was hospitalized from 04/07/24 to 04/13/24 during which he was started on dialysis, and was discharged after dialysis on 8/15 to continue outpatient dialysis TTS. Patient was found has severe orthostatic hypotension, was started on high-dose midodrine .  Currently pending nursing home placement   Principal Problem:   Orthostatic hypotension Active Problems:   General weakness   ESRD (end stage renal disease) (HCC)   Anemia of chronic disease   Depression   Clotted dialysis access   Obesity (BMI 30-39.9)   History of atrial fibrillation   Vitamin D  deficiency   Assessment and Plan: Orthostatic hypotension Increased midodrine  3 times daily to 15 mg and on Florinef  0.3 mg daily.  A.m. cortisol was normal.  Patient still drops his blood pressure quite a bit when he stands up.  Continue TED hose.  Patient doing well, no change in treatment plan.  Pending nursing home placement.   General weakness Continue working with physical therapy   ESRD (end stage renal disease) (HCC) Hemodialysis as per nephrology   Anemia of chronic disease Hb stable.   Depression On Wellbutrin  and Effexor    Vitamin D  deficiency Supplement vitamin D    History of atrial fibrillation Not on anticoagulation.  Holding Toprol  with low blood pressure.     Obesity (BMI 30-39.9) class II. BMI 37.44   Clotted dialysis access PermCath exchange by vascular surgery on 9/11.       Subjective:  Patient is walking better, but still has some dizziness.  Physical Exam: Vitals:   05/31/24 1118 05/31/24 1130 05/31/24 1200 05/31/24 1223   BP: (!) 164/105 132/87  (!) 157/101  Pulse: 86 89 73 82  Resp: 19 (!) 23 (!) 22 (!) 23  Temp:    97.8 F (36.6 C)  TempSrc:    Oral  SpO2: 95% 98% 94% 99%  Weight:      Height:       General exam: Appears calm and comfortable  Respiratory system: Clear to auscultation. Respiratory effort normal. Cardiovascular system: S1 & S2 heard, RRR. No JVD, murmurs, rubs, gallops or clicks. No pedal edema. Gastrointestinal system: Abdomen is nondistended, soft and nontender. No organomegaly or masses felt. Normal bowel sounds heard. Central nervous system: Alert and oriented. No focal neurological deficits. Extremities: Symmetric 5 x 5 power. Skin: No rashes, lesions or ulcers Psychiatry: Judgement and insight appear normal. Mood & affect appropriate.    Data Reviewed:  There are no new results to review at this time.  Family Communication: None  Disposition: Status is: Inpatient Remains inpatient appropriate because: Unsafe discharge pending nursing placement     Time spent: 25 minutes  Author: Murvin Mana, MD 05/31/2024 1:00 PM  For on call review [email or web address].

## 2024-05-31 NOTE — Progress Notes (Signed)
 0809 Pt transported down to Dialysis at this time via recliner.

## 2024-05-31 NOTE — Plan of Care (Signed)

## 2024-05-31 NOTE — Progress Notes (Signed)
  Received patient in bed to unit.   Informed consent signed and in chart.    TX duration: 3.5hrs     Transported back to floor  Hand-off given to patient's nurse. No acute distress noted no c/o   Access used: R HD Catheter  Access issues: cathter not working  activase  instilled mid tx and catheter still not working well   multiple arterial alarms and pt rinsed back 1.5hrs early,  NP made aware, requested new cath placement tomorrow   primary nurse made aware of above  Total UF removed: none Medication(s) given: cathflow  Post HD VS: wnl       Olivia Hurst LPN [facility]

## 2024-05-31 NOTE — TOC Progression Note (Signed)
 Transition of Care [facility]) - Progression Note    Patient Details  Name: RONNELL CLINGER MRN: [id number] Date of Birth: Jul 16, 1966  [facility] CM/SW Contact  Alvaro Louder, [place] Phone Number: 05/31/2024, 12:15 PM  Clinical Narrative:  LCSWA spoke to Karna Rosier on Monday. She indicated that [facility] would be [facility] to [facility] if [facility] could [facility] to a program where he stays in [place] with other Veterans. LCSWA following up with Karna Rosier.   Per meeting with [facility] leadership patient may be a candidate for a [facility] bed.    LCSWA to follow for discharge.                       Expected Discharge Plan and Services                                               Social Drivers of Health (SDOH) Interventions SDOH Screenings   Food Insecurity: Food Insecurity Present (04/16/2024)  Housing: High Risk (04/16/2024)  Transportation Needs: Unmet Transportation Needs (04/16/2024)  Utilities: At Risk (04/16/2024)  Depression (PHQ2-9): High Risk (02/27/2024)  Tobacco Use: High Risk (05/19/2024)    Readmission Risk Interventions     No data to display

## 2024-06-01 ENCOUNTER — Encounter
Admission: EM | Disposition: A | Discharge status: Skilled Nursing Facility | Payer: Self-pay | Source: Home / Self Care | Attending: Student

## 2024-06-01 DIAGNOSIS — D638 Anemia in other chronic diseases classified elsewhere: Secondary | ICD-10-CM | POA: Diagnosis not present

## 2024-06-01 DIAGNOSIS — Z992 Dependence on renal dialysis: Secondary | ICD-10-CM | POA: Diagnosis not present

## 2024-06-01 DIAGNOSIS — T8249XA Other complication of vascular dialysis catheter, initial encounter: Secondary | ICD-10-CM | POA: Diagnosis not present

## 2024-06-01 DIAGNOSIS — I951 Orthostatic hypotension: Secondary | ICD-10-CM | POA: Diagnosis not present

## 2024-06-01 DIAGNOSIS — N186 End stage renal disease: Secondary | ICD-10-CM | POA: Diagnosis not present

## 2024-06-01 HISTORY — PX: DIALYSIS/PERMA CATHETER REPAIR: CATH118293

## 2024-06-01 SURGERY — DIALYSIS/PERMA CATHETER REPAIR
Anesthesia: Moderate Sedation

## 2024-06-01 MED ORDER — HEPARIN (PORCINE) IN NACL 1000-0.9 UT/500ML-% IV SOLN
INTRAVENOUS | Status: DC | PRN
  Administered 2024-06-01: 500 mL

## 2024-06-01 MED ORDER — FENTANYL CITRATE (PF) 100 MCG/2ML IJ SOLN
INTRAMUSCULAR | Status: DC | PRN
  Administered 2024-06-01 (×2): 50 ug via INTRAVENOUS

## 2024-06-01 MED ORDER — HEPARIN SODIUM (PORCINE) 10000 UNIT/ML IJ SOLN
INTRAMUSCULAR | Status: DC | PRN
  Administered 2024-06-01: 10000 [IU]

## 2024-06-01 MED ORDER — LIDOCAINE-EPINEPHRINE (PF) 1 %-1:200000 IJ SOLN
INTRAMUSCULAR | Status: DC | PRN
  Administered 2024-06-01: 20 mL

## 2024-06-01 MED ORDER — FAMOTIDINE 20 MG PO TABS: 40.0000 mg | ORAL_TABLET | Freq: Once | ORAL | Status: DC | PRN

## 2024-06-01 MED ORDER — DIPHENHYDRAMINE HCL 50 MG/ML IJ SOLN: 50.0000 mg | Freq: Once | INTRAMUSCULAR | Status: DC | PRN

## 2024-06-01 MED ORDER — SODIUM CHLORIDE 0.9 % IV SOLN: INTRAVENOUS | Status: DC

## 2024-06-01 MED ORDER — MIDAZOLAM HCL 2 MG/2ML IJ SOLN
INTRAMUSCULAR | Status: AC
  Filled 2024-06-01: qty 2

## 2024-06-01 MED ORDER — FENTANYL CITRATE (PF) 100 MCG/2ML IJ SOLN
INTRAMUSCULAR | Status: AC
  Filled 2024-06-01: qty 2

## 2024-06-01 MED ORDER — MIDAZOLAM HCL 2 MG/ML PO SYRP: 8.0000 mg | ORAL_SOLUTION | Freq: Once | ORAL | Status: DC | PRN

## 2024-06-01 MED ORDER — CEFAZOLIN SODIUM-DEXTROSE 1-4 GM/50ML-% IV SOLN
1.0000 g | INTRAVENOUS | Status: AC
  Administered 2024-06-01: 1 g via INTRAVENOUS

## 2024-06-01 MED ORDER — METHYLPREDNISOLONE SODIUM SUCC 125 MG IJ SOLR: 125.0000 mg | Freq: Once | INTRAMUSCULAR | Status: DC | PRN

## 2024-06-01 MED ORDER — MIDAZOLAM HCL 2 MG/2ML IJ SOLN
INTRAMUSCULAR | Status: DC | PRN
  Administered 2024-06-01 (×2): 1 mg via INTRAVENOUS

## 2024-06-01 MED ORDER — CEFAZOLIN SODIUM-DEXTROSE 1-4 GM/50ML-% IV SOLN
INTRAVENOUS | Status: AC
  Filled 2024-06-01: qty 50

## 2024-06-01 SURGICAL SUPPLY — 6 items
BIOPATCH RED 1 DISK 7.0 (GAUZE/BANDAGES/DRESSINGS) IMPLANT
CATH PALINDROME-P 19CM W/VT (CATHETERS) IMPLANT
GUIDEWIRE SUPER STIFF .035X180 (WIRE) IMPLANT
PACK ANGIOGRAPHY (CUSTOM PROCEDURE TRAY) IMPLANT
SUT MNCRL AB 4-0 PS2 18 (SUTURE) IMPLANT
SUT PROLENE 0 CT 1 30 (SUTURE) IMPLANT

## 2024-06-01 NOTE — Plan of Care (Signed)
  Problem: Skin Integrity: Goal: Risk for impaired skin integrity will decrease Outcome: Progressing   Problem: Nutritional: Goal: Maintenance of adequate nutrition will improve Outcome: Progressing   Problem: Metabolic: Goal: Ability to maintain appropriate glucose levels will improve Outcome: Progressing

## 2024-06-01 NOTE — Progress Notes (Signed)
  Progress Note   [name] FMW:[id number] DOB: 1966/04/24 DOA: 04/15/2024     43 DOS: the patient was seen and examined on 06/01/2024   Brief hospital course: 58 y.o. male with medical history significant of newly ESRD recently started on HD, atrial fibrillation, anxiety and depression, morbid obesity who presented with complaints of weakness and falls. Of note, pt was hospitalized from 04/07/24 to 04/13/24 during which he was started on dialysis, and was discharged after dialysis on 8/15 to continue outpatient dialysis TTS. Patient was found has severe orthostatic hypotension, was started on high-dose midodrine .  Currently pending nursing home placement   Principal Problem:   Orthostatic hypotension Active Problems:   General weakness   ESRD (end stage renal disease) (HCC)   Anemia of chronic disease   Depression   Clotted dialysis access   Obesity (BMI 30-39.9)   History of atrial fibrillation   Vitamin D  deficiency   Assessment and Plan:  Orthostatic hypotension Increased midodrine  3 times daily to 15 mg and on Florinef  0.3 mg daily.  A.m. cortisol was normal.  Patient still drops his blood pressure quite a bit when he stands up.  Continue TED hose.  Still has some dizziness, but overall has improved.  Essential hypertension. Blood pressure running higher, but due to orthostatic hypotension, will not restart blood pressure medicine.   General weakness Continue working with physical therapy   ESRD (end stage renal disease) (HCC) Clotted dialysis access Anemia of end-stage renal disease. PermCath exchange by vascular surgery on 9/11. Hemodialysis as per nephrology   Depression On Wellbutrin  and Effexor    Vitamin D  deficiency Supplement vitamin D    Paroxysmal atrial fibrillation Not on anticoagulation.  Holding Toprol  with low blood pressure.   Currently in sinus.   Obesity (BMI 30-39.9) class II. BMI 37.44          Subjective:  Doing well  today, he has no complaint.  Physical Exam: Vitals:   05/31/24 1620 05/31/24 1939 06/01/24 0426 06/01/24 0815  BP: (!) 157/100 (!) 145/96 115/79 (!) 131/94  Pulse: 76 (!) 108 92 77  Resp: 19 18 18 17   Temp: 98.2 F (36.8 C) 98.6 F (37 C) 98.2 F (36.8 C) 98 F (36.7 C)  TempSrc: Oral Oral Oral Oral  SpO2: 98% 100% 98% 100%  Weight:      Height:       General exam: Appears calm and comfortable  Respiratory system: Clear to auscultation. Respiratory effort normal. Cardiovascular system: S1 & S2 heard, RRR. No JVD, murmurs, rubs, gallops or clicks. No pedal edema. Gastrointestinal system: Abdomen is nondistended, soft and nontender. No organomegaly or masses felt. Normal bowel sounds heard. Central nervous system: Alert and oriented. No focal neurological deficits. Extremities: Symmetric 5 x 5 power. Skin: No rashes, lesions or ulcers Psychiatry: Judgement and insight appear normal. Mood & affect appropriate.    Data Reviewed:  There are no new results to review at this time.  Family Communication: None  Disposition: Status is: Inpatient Remains inpatient appropriate because: Unsafe discharge, pending nursing home placement.     Time spent: 35 minutes  Author: Murvin Mana, MD 06/01/2024 10:07 AM  For on call review [email or web address].

## 2024-06-01 NOTE — Interval H&P Note (Signed)
 History and Physical Interval Note:  06/01/2024 3:36 PM  Brady Edwards [place]  has presented today for surgery, with the diagnosis of ESRD.  The various methods of treatment have been discussed with the patient and family. After consideration of risks, benefits and other options for treatment, the patient has consented to  Procedure(s): DIALYSIS/PERMA CATHETER REPAIR (N/A) as a surgical intervention.  The patient's history has been reviewed, patient examined, no change in status, stable for surgery.  I have reviewed the patient's chart and labs.  Questions were answered to the patient's satisfaction.     Lenor Provencher

## 2024-06-01 NOTE — H&P (View-Only) (Signed)
  Progress Note   [name] FMW:[id number] DOB: 1966/04/24 DOA: 04/15/2024     43 DOS: the patient was seen and examined on 06/01/2024   Brief hospital course: 58 y.o. male with medical history significant of newly ESRD recently started on HD, atrial fibrillation, anxiety and depression, morbid obesity who presented with complaints of weakness and falls. Of note, pt was hospitalized from 04/07/24 to 04/13/24 during which he was started on dialysis, and was discharged after dialysis on 8/15 to continue outpatient dialysis TTS. Patient was found has severe orthostatic hypotension, was started on high-dose midodrine .  Currently pending nursing home placement   Principal Problem:   Orthostatic hypotension Active Problems:   General weakness   ESRD (end stage renal disease) (HCC)   Anemia of chronic disease   Depression   Clotted dialysis access   Obesity (BMI 30-39.9)   History of atrial fibrillation   Vitamin D  deficiency   Assessment and Plan:  Orthostatic hypotension Increased midodrine  3 times daily to 15 mg and on Florinef  0.3 mg daily.  A.m. cortisol was normal.  Patient still drops his blood pressure quite a bit when he stands up.  Continue TED hose.  Still has some dizziness, but overall has improved.  Essential hypertension. Blood pressure running higher, but due to orthostatic hypotension, will not restart blood pressure medicine.   General weakness Continue working with physical therapy   ESRD (end stage renal disease) (HCC) Clotted dialysis access Anemia of end-stage renal disease. PermCath exchange by vascular surgery on 9/11. Hemodialysis as per nephrology   Depression On Wellbutrin  and Effexor    Vitamin D  deficiency Supplement vitamin D    Paroxysmal atrial fibrillation Not on anticoagulation.  Holding Toprol  with low blood pressure.   Currently in sinus.   Obesity (BMI 30-39.9) class II. BMI 37.44          Subjective:  Doing well  today, he has no complaint.  Physical Exam: Vitals:   05/31/24 1620 05/31/24 1939 06/01/24 0426 06/01/24 0815  BP: (!) 157/100 (!) 145/96 115/79 (!) 131/94  Pulse: 76 (!) 108 92 77  Resp: 19 18 18 17   Temp: 98.2 F (36.8 C) 98.6 F (37 C) 98.2 F (36.8 C) 98 F (36.7 C)  TempSrc: Oral Oral Oral Oral  SpO2: 98% 100% 98% 100%  Weight:      Height:       General exam: Appears calm and comfortable  Respiratory system: Clear to auscultation. Respiratory effort normal. Cardiovascular system: S1 & S2 heard, RRR. No JVD, murmurs, rubs, gallops or clicks. No pedal edema. Gastrointestinal system: Abdomen is nondistended, soft and nontender. No organomegaly or masses felt. Normal bowel sounds heard. Central nervous system: Alert and oriented. No focal neurological deficits. Extremities: Symmetric 5 x 5 power. Skin: No rashes, lesions or ulcers Psychiatry: Judgement and insight appear normal. Mood & affect appropriate.    Data Reviewed:  There are no new results to review at this time.  Family Communication: None  Disposition: Status is: Inpatient Remains inpatient appropriate because: Unsafe discharge, pending nursing home placement.     Time spent: 35 minutes  Author: Murvin Mana, MD 06/01/2024 10:07 AM  For on call review [email or web address].

## 2024-06-01 NOTE — Op Note (Signed)
 OPERATIVE NOTE    PRE-OPERATIVE DIAGNOSIS: 1. ESRD 2. Non-functional permcath  POST-OPERATIVE DIAGNOSIS: same as above  PROCEDURE: Fluoroscopic guidance for placement of catheter Placement of a 19 cm tip to cuff tunneled hemodialysis catheter via the right internal jugular vein and removal of previous catheter  SURGEON: Selinda Gu, MD  ANESTHESIA:  Local with moderate conscious sedation for 10 minutes using 2 mg of Versed  and 100 mcg of Fentanyl   ESTIMATED BLOOD LOSS: 3 cc  FINDING(S): none  SPECIMEN(S):  None  INDICATIONS:   Patient is a 58 y.o.male who presents with non-functional dialysis catheter and ESRD.  The patient needs long term dialysis access for their ESRD, and a Permcath is necessary.  Risks and benefits are discussed and informed consent is obtained.    DESCRIPTION: After obtaining full informed written consent, the patient was brought back to the vascular suite. The patient received moderate conscious sedation during a face-to-face encounter with me present throughout the entire procedure and supervising the RN monitoring the vital signs, pulse oximetry, telemetry, and mental status throughout the entire procedure. The patient's existing catheter, right neck and chest were sterilely prepped and draped in a sterile surgical field was created.  The existing catheter was dissected free from the fibrous sheath securing the cuff with hemostats and blunt dissection.  A wire was placed. The existing catheter was then removed and the wire used to keep venous access. I selected a 19 cm tip to cuff tunneled dialysis catheter.  Using fluoroscopic guidance the catheter tips were parked in the right atrium. The appropriate distal connectors were placed. It withdrew blood well and flushed easily with heparinized saline and a concentrated heparin  solution was then placed. It was secured to the chest wall with 2 Prolene sutures. A 4-0 Monocryl pursestring suture was placed around the exit  site. Sterile dressings were placed. The patient tolerated the procedure well and was taken to the recovery room in stable condition.  COMPLICATIONS: None  CONDITION: Stable  Selinda Gu 06/01/2024 4:58 PM   This note was created with Dragon Medical transcription system. Any errors in dictation are purely unintentional.

## 2024-06-01 NOTE — Progress Notes (Signed)
 [facility]  ROUNDING NOTE   Subjective:   Patient resting in bed Able to tolerate ambulation with slow changes in position.    Objective:  Vital signs in last 24 hours:  Temp:  [98 F (36.7 C)-98.6 F (37 C)] 98 F (36.7 C) (10/03 0815) Pulse Rate:  [76-108] 77 (10/03 0815) Resp:  [17-19] 17 (10/03 0815) BP: (115-157)/(79-100) 131/94 (10/03 0815) SpO2:  [98 %-100 %] 100 % (10/03 0815)  Weight change:  Filed Weights   05/29/24 0737 05/29/24 1105 05/31/24 0824  Weight: 111.9 kg 111.7 kg 111.4 kg    Intake/Output: I/O last 3 completed shifts: In: 240 [P.O.:240] Out: 0    Intake/Output this shift:  No intake/output data recorded.  Physical Exam: General: NAD  Head: Normocephalic  Eyes: Anicteric  Lungs:  Clear, Room air  Heart: Regular rate   Abdomen:  Soft  Extremities:  no peripheral edema.  Neurologic: Alert  Skin: Intact  Access: Rt chest permcath    Basic Metabolic Panel: Recent Labs  Lab 05/26/24 0830 05/29/24 0745 05/31/24 0910  NA 135 136 134*  K 3.5 3.6 3.4*  CL 96* 94* 98  CO2 28 27 25   GLUCOSE 89 93 105*  BUN 24* 22* 17  CREATININE 8.20* 9.55* 9.13*  CALCIUM  9.3 9.4 9.5  MG 2.1 2.5*  --   PHOS 4.0  4.1 4.8*  4.9* 3.7    Liver Function Tests: Recent Labs  Lab 05/26/24 0830 05/29/24 0745 05/31/24 0910  ALBUMIN  3.6 3.9 3.8   No results for input(s): LIPASE, AMYLASE in the last 168 hours. No results for input(s): AMMONIA in the last 168 hours.  CBC: Recent Labs  Lab 05/26/24 0830 05/29/24 0745 05/31/24 0910  WBC 6.6 7.8 7.7  HGB 10.4* 11.2* 11.2*  HCT 31.3* 34.5* 35.0*  MCV 94.6 96.1 97.0  PLT 281 319 298    Cardiac Enzymes: No results for input(s): CKTOTAL, CKMB, CKMBINDEX, TROPONINI in the last 168 hours.  BNP: Invalid input(s): POCBNP  CBG: No results for input(s): GLUCAP in the last 168 hours.   Microbiology: Results for orders placed or performed during the hospital encounter  of 04/07/24  Resp panel by RT-PCR (RSV, Flu A&B, Covid) Anterior Nasal Swab     Status: None   Collection Time: 04/07/24  4:46 PM   Specimen: Anterior Nasal Swab  Result Value Ref Range Status   SARS Coronavirus 2 by RT PCR NEGATIVE NEGATIVE Final    Comment: (NOTE) SARS-CoV-2 target nucleic acids are NOT DETECTED.  The SARS-CoV-2 RNA is generally detectable in upper respiratory specimens during the acute phase of infection. The lowest concentration of SARS-CoV-2 viral copies this assay can detect is 138 copies/mL. A negative result does not preclude SARS-Cov-2 infection and should not be used as the sole basis for treatment or other patient management decisions. A negative result may occur with  improper specimen collection/handling, submission of specimen other than nasopharyngeal swab, presence of viral mutation(s) within the areas targeted by this assay, and inadequate number of viral copies(<138 copies/mL). A negative result must be combined with clinical observations, patient history, and epidemiological information. The expected result is Negative.  Fact Sheet for Patients:  BloggerCourse.com  Fact Sheet for Healthcare Providers:  SeriousBroker.it  This test is no t yet approved or cleared by the [place]  FDA and  has been authorized for detection and/or diagnosis of SARS-CoV-2 by FDA under an Emergency Use Authorization (EUA). This EUA will remain  in effect (meaning this  test can be used) for the duration of the COVID-19 declaration under Section 564(b)(1) of the Act, 21 U.S.C.section 360bbb-3(b)(1), unless the authorization is terminated  or revoked sooner.       Influenza A by PCR NEGATIVE NEGATIVE Final   Influenza B by PCR NEGATIVE NEGATIVE Final    Comment: (NOTE) The Xpert Xpress SARS-CoV-2/FLU/RSV plus assay is intended as an aid in the diagnosis of influenza from Nasopharyngeal swab specimens and should  not be used as a sole basis for treatment. Nasal washings and aspirates are unacceptable for Xpert Xpress SARS-CoV-2/FLU/RSV testing.  Fact Sheet for Patients: BloggerCourse.com  Fact Sheet for Healthcare Providers: SeriousBroker.it  This test is not yet approved or cleared by the [place]  FDA and has been authorized for detection and/or diagnosis of SARS-CoV-2 by FDA under an Emergency Use Authorization (EUA). This EUA will remain in effect (meaning this test can be used) for the duration of the COVID-19 declaration under Section 564(b)(1) of the Act, 21 U.S.C. section 360bbb-3(b)(1), unless the authorization is terminated or revoked.     Resp Syncytial Virus by PCR NEGATIVE NEGATIVE Final    Comment: (NOTE) Fact Sheet for Patients: BloggerCourse.com  Fact Sheet for Healthcare Providers: SeriousBroker.it  This test is not yet approved or cleared by the [place]  FDA and has been authorized for detection and/or diagnosis of SARS-CoV-2 by FDA under an Emergency Use Authorization (EUA). This EUA will remain in effect (meaning this test can be used) for the duration of the COVID-19 declaration under Section 564(b)(1) of the Act, 21 U.S.C. section 360bbb-3(b)(1), unless the authorization is terminated or revoked.  Performed at [facility], [address] [place], [place] [address]     Coagulation Studies: No results for input(s): LABPROT, INR in the last 72 hours.  Urinalysis: No results for input(s): COLORURINE, LABSPEC, PHURINE, GLUCOSEU, HGBUR, BILIRUBINUR, KETONESUR, PROTEINUR, UROBILINOGEN, NITRITE, LEUKOCYTESUR in the last 72 hours.  Invalid input(s): APPERANCEUR    Imaging: No results found.   Medications:     bisacodyl   10 mg Oral QHS   buPROPion   300 mg Oral Daily   calcitRIOL   0.25 mcg Oral Daily    Chlorhexidine  Gluconate Cloth  6 each Topical Daily   cholecalciferol   1,000 Units Oral Daily   fludrocortisone   0.3 mg Oral Daily   heparin   5,000 Units Subcutaneous Q8H   influenza vac split trivalent PF  0.5 mL Intramuscular Tomorrow-1000   midodrine   15 mg Oral TID WC   polyethylene glycol  17 g Oral BID   sevelamer  carbonate  1,600 mg Oral TID WC   venlafaxine  XR  150 mg Oral Q breakfast   And   venlafaxine  XR  75 mg Oral Q breakfast   acetaminophen , bisacodyl , butalbital -acetaminophen -caffeine , ondansetron  (ZOFRAN ) IV, ondansetron   Assessment/ Plan:  [name] is a 58 y.o.  male with a PMHx of hypertension, atrial fibrillation, BPH, hyperlipidemia, anxiety, constipation, morbid obesity, and end stage renal disease on hemodialysis.    [facility]- will need to resubmit for chair at discharge.    End stage renal disease on hemodialysis       Appreciate vascular performing permcath exchange today. Next treatment scheduled for Saturday. Dialysis navigator will seek outpatient clinic placement at [facility].     2.  Anemia of chronic kidney disease. Hgb 11.2,      3.  Secondary hyperparathyroidism. PTH 199 during recent admission. Continue Renvela  1 tabs TID.  Bone minerals acceptable   4. Severe orthostatic hypotension Workup so far has included 2D echo from 04/11/2024 which shows LVEF 65 to 70%, normal LV systolic function, mild concentric LVH, normal diastolic parameters, normal right ventricular systolic function.  A.m. cortisol level normal from 04/24/2024.  TSH normal.  Continue TEDs,and abd binders.  Continue midodrine  10 mg 3 times a day along with Florinef  to 0.4mg  daily for blood pressure support.     LOS: 43 Brady Edwards 10/3/20251:23 PM

## 2024-06-02 DIAGNOSIS — D638 Anemia in other chronic diseases classified elsewhere: Secondary | ICD-10-CM | POA: Diagnosis not present

## 2024-06-02 DIAGNOSIS — N186 End stage renal disease: Secondary | ICD-10-CM | POA: Diagnosis not present

## 2024-06-02 DIAGNOSIS — I951 Orthostatic hypotension: Secondary | ICD-10-CM | POA: Diagnosis not present

## 2024-06-02 LAB — CBC WITH DIFFERENTIAL/PLATELET
Abs Immature Granulocytes: 0.03 K/uL (ref 0.00–0.07)
Basophils Absolute: 0.1 K/uL (ref 0.0–0.1)
Basophils Relative: 1 %
Eosinophils Absolute: 0.1 K/uL (ref 0.0–0.5)
Eosinophils Relative: 2 %
HCT: 33 % — ABNORMAL LOW (ref 39.0–52.0)
Hemoglobin: 10.6 g/dL — ABNORMAL LOW (ref 13.0–17.0)
Immature Granulocytes: 0 %
Lymphocytes Relative: 26 %
Lymphs Abs: 2.1 K/uL (ref 0.7–4.0)
MCH: 31 pg (ref 26.0–34.0)
MCHC: 32.1 g/dL (ref 30.0–36.0)
MCV: 96.5 fL (ref 80.0–100.0)
Monocytes Absolute: 0.9 K/uL (ref 0.1–1.0)
Monocytes Relative: 11 %
Neutro Abs: 5 K/uL (ref 1.7–7.7)
Neutrophils Relative %: 60 %
Platelets: 259 K/uL (ref 150–400)
RBC: 3.42 MIL/uL — ABNORMAL LOW (ref 4.22–5.81)
RDW: 14.5 % (ref 11.5–15.5)
WBC: 8.3 K/uL (ref 4.0–10.5)
nRBC: 0 % (ref 0.0–0.2)

## 2024-06-02 LAB — RENAL FUNCTION PANEL
Albumin: 3.5 g/dL (ref 3.5–5.0)
Anion gap: 17 — ABNORMAL HIGH (ref 5–15)
BUN: 21 mg/dL — ABNORMAL HIGH (ref 6–20)
CO2: 23 mmol/L (ref 22–32)
Calcium: 9.4 mg/dL (ref 8.9–10.3)
Chloride: 96 mmol/L — ABNORMAL LOW (ref 98–111)
Creatinine, Ser: 9.56 mg/dL — ABNORMAL HIGH (ref 0.61–1.24)
GFR, Estimated: 6 mL/min — ABNORMAL LOW (ref >60)
Glucose, Bld: 108 mg/dL — ABNORMAL HIGH (ref 70–99)
Phosphorus: 4.4 mg/dL (ref 2.5–4.6)
Potassium: 3.8 mmol/L (ref 3.5–5.1)
Sodium: 136 mmol/L (ref 135–145)

## 2024-06-02 MED ORDER — HEPARIN SODIUM (PORCINE) 1000 UNIT/ML DIALYSIS
1000.0000 [IU] | INTRAMUSCULAR | Status: DC | PRN
  Administered 2024-06-02: 1000 [IU]
  Filled 2024-06-02: qty 1

## 2024-06-02 MED ORDER — HEPARIN SODIUM (PORCINE) 1000 UNIT/ML IJ SOLN
INTRAMUSCULAR | Status: AC
  Filled 2024-06-02: qty 6

## 2024-06-02 MED ORDER — HEPARIN SODIUM (PORCINE) 1000 UNIT/ML IJ SOLN
INTRAMUSCULAR | Status: AC
  Filled 2024-06-02: qty 5

## 2024-06-02 MED ORDER — ALTEPLASE 2 MG IJ SOLR: 2.0000 mg | Freq: Once | INTRAMUSCULAR | Status: DC | PRN

## 2024-06-02 NOTE — Progress Notes (Signed)
  Progress Note   [name] FMW:[id number] DOB: 23-Jun-1966 DOA: 04/15/2024     44 DOS: the patient was seen and examined on 06/02/2024   Brief hospital course: 58 y.o. male with medical history significant of newly ESRD recently started on HD, atrial fibrillation, anxiety and depression, morbid obesity who presented with complaints of weakness and falls. Of note, pt was hospitalized from 04/07/24 to 04/13/24 during which he was started on dialysis, and was discharged after dialysis on 8/15 to continue outpatient dialysis TTS. Patient was found has severe orthostatic hypotension, was started on high-dose midodrine .  Currently pending nursing home placement   Principal Problem:   Orthostatic hypotension Active Problems:   General weakness   ESRD (end stage renal disease) (HCC)   Anemia of chronic disease   Depression   Clotted dialysis access   Obesity (BMI 30-39.9)   History of atrial fibrillation   Vitamin D  deficiency   Assessment and Plan:  Orthostatic hypotension Increased midodrine  3 times daily to 15 mg and on Florinef  0.3 mg daily.  A.m. cortisol was normal.  Patient still drops his blood pressure quite a bit when he stands up.  Continue TED hose.  Still has some dizziness, but overall has improved.   Essential hypertension. Blood pressure running higher, but due to orthostatic hypotension, will not restart blood pressure medicine.   General weakness Continue working with physical therapy   ESRD (end stage renal disease) (HCC) Clotted dialysis access Anemia of end-stage renal disease. PermCath exchange by vascular surgery on 9/11 and 10/3.  Hemodialysis as per nephrology   Depression On Wellbutrin  and Effexor    Vitamin D  deficiency Supplement vitamin D    Paroxysmal atrial fibrillation Not on anticoagulation.  Holding Toprol  with low blood pressure.   Currently in sinus.   Obesity (BMI 30-39.9) class II. BMI 37.44  Patient stable, no change in  treatment plan.    Subjective:  No complaint.  Physical Exam: Vitals:   06/02/24 1030 06/02/24 1100 06/02/24 1126 06/02/24 1127  BP: 130/86 119/87 (!) 128/94   Pulse: 88 86 85 88  Resp: 12 19 12  (!) 21  Temp:   98.2 F (36.8 C)   TempSrc:   Oral   SpO2: 99% 99% 99% 99%  Weight:    111.2 kg  Height:       General exam: Appears calm and comfortable  Respiratory system: Clear to auscultation. Respiratory effort normal. Cardiovascular system: S1 & S2 heard, RRR. No JVD, murmurs, rubs, gallops or clicks. No pedal edema. Gastrointestinal system: Abdomen is nondistended, soft and nontender. No organomegaly or masses felt. Normal bowel sounds heard. Central nervous system: Alert and oriented. No focal neurological deficits. Extremities: Symmetric 5 x 5 power. Skin: No rashes, lesions or ulcers Psychiatry: Judgement and insight appear normal. Mood & affect appropriate.    Data Reviewed:  There are no new results to review at this time.  Family Communication: None  Disposition: Status is: Inpatient Remains inpatient appropriate because: Unsafe discharge pending placement.     Time spent: 25 minutes  Author: Murvin Mana, MD 06/02/2024 12:17 PM  For on call review [email or web address].

## 2024-06-02 NOTE — Progress Notes (Signed)
  Vein and Vascular Surgery  Daily Progress Note   Subjective  -   No complaints since replacing RIJ HD catheter yesterday.  In HD now.  Ran OK but not perfect.  Certainly better than before exchange.  Nephro Team present.  Objective Vitals:   06/02/24 0800 06/02/24 0830 06/02/24 0900 06/02/24 0930  BP: (!) 135/90 108/89 (!) 126/90 (!) 121/97  Pulse: 71 66 91 75  Resp: 14 14 (!) 23 14  Temp:      TempSrc:      SpO2: 99% 98% 98% 99%  Weight:      Height:       No intake or output data in the 24 hours ending 06/02/24 0956  PULM  CTAB CV  RRR VASC  No bleeding at catheter site.  Clean dressing.  HD running with no alarms at present.  Laboratory CBC    Component Value Date/Time   WBC 8.3 06/02/2024 0730   HGB 10.6 (L) 06/02/2024 0730   HGB 16.3 02/12/2021 1604   HCT 33.0 (L) 06/02/2024 0730   HCT 47.9 02/12/2021 1604   PLT 259 06/02/2024 0730   PLT 252 02/12/2021 1604    BMET    Component Value Date/Time   NA 136 06/02/2024 0730   NA 141 11/23/2022 1046   K 3.8 06/02/2024 0730   CL 96 (L) 06/02/2024 0730   CO2 23 06/02/2024 0730   GLUCOSE 108 (H) 06/02/2024 0730   BUN 21 (H) 06/02/2024 0730   BUN 33 (H) 11/23/2022 1046   CREATININE 9.56 (H) 06/02/2024 0730   CALCIUM  9.4 06/02/2024 0730   GFRNONAA 6 (L) 06/02/2024 0730   GFRAA 74 08/03/2019 0813    Assessment/Planning: POD #1 s/p exchange of RIJ HD catheter for malfunction  Nephro Team aware to call if any issues arise.  Patient satisfied with results.   Brady Edwards  06/02/2024, 9:56 AM

## 2024-06-02 NOTE — Plan of Care (Signed)

## 2024-06-02 NOTE — Progress Notes (Signed)
 0 Ultrafiltration with hemodialysis treatment, condition stable post treatment and report was given to the primary RN.

## 2024-06-02 NOTE — Progress Notes (Signed)
 [facility]  ROUNDING NOTE   Subjective:   Patient seen and evaluated during dialysis   HEMODIALYSIS FLOWSHEET:  Blood Flow Rate (mL/min): 320 mL/min Arterial Pressure (mmHg): -199.79 mmHg Venous Pressure (mmHg): 114.94 mmHg TMP (mmHg): 13.73 mmHg Ultrafiltration Rate (mL/min): 352 mL/min Dialysate Flow Rate (mL/min): 299 ml/min Dialysis Fluid Bolus: Normal Saline Bolus Amount (mL): 300 mL  Tolerating treatment well Permcath functioning well, not as many alarms   Objective:  Vital signs in last 24 hours:  Temp:  [97.5 F (36.4 C)-98.6 F (37 C)] 97.9 F (36.6 C) (10/04 0721) Pulse Rate:  [66-99] 88 (10/04 1030) Resp:  [7-25] 12 (10/04 1030) BP: (108-157)/(84-102) 130/86 (10/04 1030) SpO2:  [94 %-100 %] 99 % (10/04 1030) Weight:  [111.2 kg] 111.2 kg (10/04 0721)  Weight change:  Filed Weights   05/29/24 1105 05/31/24 0824 06/02/24 0721  Weight: 111.7 kg 111.4 kg 111.2 kg    Intake/Output: No intake/output data recorded.   Intake/Output this shift:  No intake/output data recorded.  Physical Exam: General: NAD  Head: Normocephalic  Eyes: Anicteric  Lungs:  Clear, Room air  Heart: Regular rate   Abdomen:  Soft  Extremities:  no peripheral edema.  Neurologic: Alert  Skin: Intact  Access: Rt chest permcath    Basic Metabolic Panel: Recent Labs  Lab 05/29/24 0745 05/31/24 0910 06/02/24 0730  NA 136 134* 136  K 3.6 3.4* 3.8  CL 94* 98 96*  CO2 27 25 23   GLUCOSE 93 105* 108*  BUN 22* 17 21*  CREATININE 9.55* 9.13* 9.56*  CALCIUM  9.4 9.5 9.4  MG 2.5*  --   --   PHOS 4.8*  4.9* 3.7 4.4    Liver Function Tests: Recent Labs  Lab 05/29/24 0745 05/31/24 0910 06/02/24 0730  ALBUMIN  3.9 3.8 3.5   No results for input(s): LIPASE, AMYLASE in the last 168 hours. No results for input(s): AMMONIA in the last 168 hours.  CBC: Recent Labs  Lab 05/29/24 0745 05/31/24 0910 06/02/24 0730  WBC 7.8 7.7 8.3  NEUTROABS  --   --  5.0   HGB 11.2* 11.2* 10.6*  HCT 34.5* 35.0* 33.0*  MCV 96.1 97.0 96.5  PLT 319 298 259    Cardiac Enzymes: No results for input(s): CKTOTAL, CKMB, CKMBINDEX, TROPONINI in the last 168 hours.  BNP: Invalid input(s): POCBNP  CBG: No results for input(s): GLUCAP in the last 168 hours.   Microbiology: Results for orders placed or performed during the hospital encounter of 04/07/24  Resp panel by RT-PCR (RSV, Flu A&B, Covid) Anterior Nasal Swab     Status: None   Collection Time: 04/07/24  4:46 PM   Specimen: Anterior Nasal Swab  Result Value Ref Range Status   SARS Coronavirus 2 by RT PCR NEGATIVE NEGATIVE Final    Comment: (NOTE) SARS-CoV-2 target nucleic acids are NOT DETECTED.  The SARS-CoV-2 RNA is generally detectable in upper respiratory specimens during the acute phase of infection. The lowest concentration of SARS-CoV-2 viral copies this assay can detect is 138 copies/mL. A negative result does not preclude SARS-Cov-2 infection and should not be used as the sole basis for treatment or other patient management decisions. A negative result may occur with  improper specimen collection/handling, submission of specimen other than nasopharyngeal swab, presence of viral mutation(s) within the areas targeted by this assay, and inadequate number of viral copies(<138 copies/mL). A negative result must be combined with clinical observations, patient history, and epidemiological information. The expected result is  Negative.  Fact Sheet for Patients:  BloggerCourse.com  Fact Sheet for Healthcare Providers:  SeriousBroker.it  This test is no t yet approved or cleared by the [place]  FDA and  has been authorized for detection and/or diagnosis of SARS-CoV-2 by FDA under an Emergency Use Authorization (EUA). This EUA will remain  in effect (meaning this test can be used) for the duration of the COVID-19 declaration  under Section 564(b)(1) of the Act, 21 U.S.C.section 360bbb-3(b)(1), unless the authorization is terminated  or revoked sooner.       Influenza A by PCR NEGATIVE NEGATIVE Final   Influenza B by PCR NEGATIVE NEGATIVE Final    Comment: (NOTE) The Xpert Xpress SARS-CoV-2/FLU/RSV plus assay is intended as an aid in the diagnosis of influenza from Nasopharyngeal swab specimens and should not be used as a sole basis for treatment. Nasal washings and aspirates are unacceptable for Xpert Xpress SARS-CoV-2/FLU/RSV testing.  Fact Sheet for Patients: BloggerCourse.com  Fact Sheet for Healthcare Providers: SeriousBroker.it  This test is not yet approved or cleared by the [place]  FDA and has been authorized for detection and/or diagnosis of SARS-CoV-2 by FDA under an Emergency Use Authorization (EUA). This EUA will remain in effect (meaning this test can be used) for the duration of the COVID-19 declaration under Section 564(b)(1) of the Act, 21 U.S.C. section 360bbb-3(b)(1), unless the authorization is terminated or revoked.     Resp Syncytial Virus by PCR NEGATIVE NEGATIVE Final    Comment: (NOTE) Fact Sheet for Patients: BloggerCourse.com  Fact Sheet for Healthcare Providers: SeriousBroker.it  This test is not yet approved or cleared by the [place]  FDA and has been authorized for detection and/or diagnosis of SARS-CoV-2 by FDA under an Emergency Use Authorization (EUA). This EUA will remain in effect (meaning this test can be used) for the duration of the COVID-19 declaration under Section 564(b)(1) of the Act, 21 U.S.C. section 360bbb-3(b)(1), unless the authorization is terminated or revoked.  Performed at [facility], [address] [place], [place] [address]     Coagulation Studies: No results for input(s): LABPROT, INR in the last 72  hours.  Urinalysis: No results for input(s): COLORURINE, LABSPEC, PHURINE, GLUCOSEU, HGBUR, BILIRUBINUR, KETONESUR, PROTEINUR, UROBILINOGEN, NITRITE, LEUKOCYTESUR in the last 72 hours.  Invalid input(s): APPERANCEUR    Imaging: PERIPHERAL VASCULAR CATHETERIZATION Result Date: 06/01/2024 See surgical note for result.    Medications:     bisacodyl   10 mg Oral QHS   buPROPion   300 mg Oral Daily   calcitRIOL   0.25 mcg Oral Daily   Chlorhexidine  Gluconate Cloth  6 each Topical Daily   cholecalciferol   1,000 Units Oral Daily   fludrocortisone   0.3 mg Oral Daily   heparin   5,000 Units Subcutaneous Q8H   influenza vac split trivalent PF  0.5 mL Intramuscular Tomorrow-1000   midodrine   15 mg Oral TID WC   polyethylene glycol  17 g Oral BID   sevelamer  carbonate  1,600 mg Oral TID WC   venlafaxine  XR  150 mg Oral Q breakfast   And   venlafaxine  XR  75 mg Oral Q breakfast   acetaminophen , alteplase , bisacodyl , butalbital -acetaminophen -caffeine , heparin , ondansetron  (ZOFRAN ) IV, ondansetron   Assessment/ Plan:  Brady Edwards is a 58 y.o.  male with a PMHx of hypertension, atrial fibrillation, BPH, hyperlipidemia, anxiety, constipation, morbid obesity, and end stage renal disease on hemodialysis.    [facility]- will need to resubmit for chair at discharge.    End stage  renal disease on hemodialysis       Appreciate vascular performing permcath exchange on 9/11 and 10/3. Permcath functioning fair during treatment. Next treatment scheduled for Tuesday.  Dialysis navigator will seek outpatient clinic placement at [facility].     2.  Anemia of chronic kidney disease. Hgb 10.6, stable     3.  Secondary hyperparathyroidism. PTH 199 during recent admission. Continue Renvela  1 tabs TID.           Calcium  and phos stable   4. Severe orthostatic hypotension Workup so far has included 2D echo from 04/11/2024 which shows LVEF 65 to 70%, normal LV  systolic function, mild concentric LVH, normal diastolic parameters, normal right ventricular systolic function.  A.m. cortisol level normal from 04/24/2024.  TSH normal.  Continue TEDs,and abd binders.  Continue midodrine  10 mg 3 times a day along with Florinef  to 0.4mg  daily for blood pressure support.     LOS: 44 Shay Bartoli 10/4/202510:36 AM

## 2024-06-02 NOTE — Plan of Care (Signed)
   Problem: Education: Goal: Knowledge of General Education information will improve Description: Including pain rating scale, medication(s)/side effects and non-pharmacologic comfort measures Outcome: Progressing   Problem: Activity: Goal: Risk for activity intolerance will decrease Outcome: Progressing   Problem: Nutrition: Goal: Adequate nutrition will be maintained Outcome: Progressing

## 2024-06-02 NOTE — Plan of Care (Signed)
   Problem: Education: Goal: Knowledge of General Education information will improve Description Including pain rating scale, medication(s)/side effects and non-pharmacologic comfort measures Outcome: Progressing

## 2024-06-03 DIAGNOSIS — N186 End stage renal disease: Secondary | ICD-10-CM | POA: Diagnosis not present

## 2024-06-03 DIAGNOSIS — I951 Orthostatic hypotension: Secondary | ICD-10-CM | POA: Diagnosis not present

## 2024-06-03 NOTE — Progress Notes (Signed)
  Progress Note   [name] FMW:[id number] DOB: Jan 14, 1966 DOA: 04/15/2024     58 DOS: the patient was seen and examined on 06/03/2024   Brief hospital course: 58 y.o. male with medical history significant of newly ESRD recently started on HD, atrial fibrillation, anxiety and depression, morbid obesity who presented with complaints of weakness and falls. Of note, pt was hospitalized from 04/07/24 to 04/13/24 during which he was started on dialysis, and was discharged after dialysis on 8/15 to continue outpatient dialysis TTS. Patient was found has severe orthostatic hypotension, was started on high-dose midodrine .  Currently pending nursing home placement   Principal Problem:   Orthostatic hypotension Active Problems:   General weakness   ESRD (end stage renal disease) (HCC)   Anemia of chronic disease   Depression   Clotted dialysis access   Obesity (BMI 30-39.9)   History of atrial fibrillation   Vitamin D  deficiency   Assessment and Plan: Orthostatic hypotension Increased midodrine  3 times daily to 15 mg and on Florinef  0.3 mg daily.  A.m. cortisol was normal.  Patient still drops his blood pressure quite a bit when he stands up.  Continue TED hose.  Condition improving.   Essential hypertension. No need for blood pressure medicine at this time.   General weakness Continue working with physical therapy   ESRD (end stage renal disease) (HCC) Clotted dialysis access Anemia of end-stage renal disease. PermCath exchange by vascular surgery on 9/11 and 10/3.  Hemodialysis as per nephrology   Depression On Wellbutrin  and Effexor    Vitamin D  deficiency Supplement vitamin D    Paroxysmal atrial fibrillation Not on anticoagulation.  Holding Toprol  with low blood pressure.   Currently in sinus.   Obesity (BMI 30-39.9) class II. BMI 37.44      Subjective:  Patient doing well today.  Physical Exam: Vitals:   06/02/24 1553 06/02/24 1928 06/03/24 0401  06/03/24 0706  BP: (!) 141/105 (!) 136/90 130/78 (!) 133/92  Pulse: (!) 105 85 80 79  Resp: 16 18 18 16   Temp: 98.5 F (36.9 C) 98.5 F (36.9 C) 98.1 F (36.7 C) 97.7 F (36.5 C)  TempSrc:  Oral Oral   SpO2: 96% 98% 98% 100%  Weight:      Height:       General exam: Appears calm and comfortable  Respiratory system: Clear to auscultation. Respiratory effort normal. Cardiovascular system: S1 & S2 heard, RRR. No JVD, murmurs, rubs, gallops or clicks. No pedal edema. Gastrointestinal system: Abdomen is nondistended, soft and nontender. No organomegaly or masses felt. Normal bowel sounds heard. Central nervous system: Alert and oriented. No focal neurological deficits. Extremities: Symmetric 5 x 5 power. Skin: No rashes, lesions or ulcers Psychiatry: Judgement and insight appear normal. Mood & affect appropriate.    Data Reviewed:  There are no new results to review at this time.  Family Communication: None  Disposition: Status is: Inpatient Remains inpatient appropriate because: Unsafe discharge.     Time spent: 25 minutes  Author: Murvin Mana, MD 06/03/2024 11:37 AM  For on call review [email or web address].

## 2024-06-03 NOTE — Plan of Care (Signed)

## 2024-06-03 NOTE — Progress Notes (Signed)
 Mobility Specialist - Progress Note    06/03/24 1532  Orthostatic Lying   BP- Lying (!) 140/96  Pulse- Lying 89  Orthostatic Sitting  BP- Sitting 110/90  Pulse- Sitting 123  Orthostatic Standing at 0 minutes  BP- Standing at 0 minutes 91/80  Orthostatic Standing at 3 minutes  BP- Standing at 3 minutes (!) 70/39  Mobility  Activity Ambulated independently;Stood at bedside;Dangled on edge of bed  Level of Assistance Independent  Assistive Device None  Distance Ambulated (ft) 6 ft  Range of Motion/Exercises Active  Activity Response Tolerated well  Mobility Referral Yes  Mobility visit 1 Mobility  Mobility Specialist Start Time (ACUTE ONLY) 1521  Mobility Specialist Stop Time (ACUTE ONLY) 1532  Mobility Specialist Time Calculation (min) (ACUTE ONLY) 11 min   Pt resting in bed on RA upon entry. Pt orthastatic vital recorded in lying, sitting, and standing. Ted hose, wrap, and abdominal binder donned with assistance. Pt could not maintain standing for 3 minutes and began shaking before final BP was finished (70/39). Session ended and RN notified of BP. Pt returned to laying and left with needs in reach.

## 2024-06-03 NOTE — TOC Progression Note (Addendum)
 Transition of Care [facility]) - Progression Note    Patient Details  Name: Brady Edwards MRN: [id number] Date of Birth: [birth year]  [facility] CM/SW Contact  Marinda Cooks, RN Phone Number: 06/03/2024, 5:00 PM  Clinical Narrative:     Pt not medically cleared to dc at this time per chart review pt remains ortho static  . Dispo : Per chart review ALF  Owner Rosana Later coming Monday to evaluate Patient. TOC will to follow dc planning/care coordination.      Expected Discharge Plan and Services    Tentatively ALF  Social Drivers of Health (SDOH) Interventions SDOH Screenings   Food Insecurity: Food Insecurity Present (04/16/2024)  Housing: High Risk (04/16/2024)  Transportation Needs: Unmet Transportation Needs (04/16/2024)  Utilities: At Risk (04/16/2024)  Depression (PHQ2-9): High Risk (02/27/2024)  Tobacco Use: High Risk (05/19/2024)    Readmission Risk Interventions     No data to display

## 2024-06-04 ENCOUNTER — Encounter: Payer: Self-pay | Admitting: Vascular Surgery

## 2024-06-04 DIAGNOSIS — I951 Orthostatic hypotension: Secondary | ICD-10-CM | POA: Diagnosis not present

## 2024-06-04 DIAGNOSIS — N186 End stage renal disease: Secondary | ICD-10-CM | POA: Diagnosis not present

## 2024-06-04 NOTE — Plan of Care (Signed)

## 2024-06-04 NOTE — Progress Notes (Signed)
 Mobility Specialist Progress Note:    06/04/24 1141  Orthostatic Lying   BP- Lying (!) 150/106  Pulse- Lying 101  Orthostatic Sitting  BP- Sitting (!) 117/93  Pulse- Sitting 120  Orthostatic Standing at 0 minutes  BP- Standing at 0 minutes (!) 88/71  Pulse- Standing at 0 minutes 126  Mobility  Activity Ambulated with assistance  Level of Assistance Modified independent, requires aide device or extra time  Assistive Device None  Distance Ambulated (ft) 300 ft  Range of Motion/Exercises Active;All extremities  Activity Response Tolerated well  Mobility visit 1 Mobility  Mobility Specialist Start Time (ACUTE ONLY) 1140  Mobility Specialist Stop Time (ACUTE ONLY) 1202  Mobility Specialist Time Calculation (min) (ACUTE ONLY) 22 min   Ted hose, ace wraps, and abdominal binder placed prior to mobility. Modi to stand and ambulate with no AD. Tolerated well, pt says symptoms today are mild and recover quickly. BP 114/100 and HR 120 after ambulation. Left pt supine, all needs met.  Sherrilee Ditty Mobility Specialist Please contact via Special educational needs teacher or  Rehab office at [phone]

## 2024-06-04 NOTE — TOC Progression Note (Signed)
 Transition of Care [facility]) - Progression Note    Patient Details  Name: Brady Edwards MRN: [id number] Date of Birth: 1966-08-16  Transition of [facility]) CM/SW Contact  Jailene Cupit  [place], [place] Phone Number: 06/04/2024, 4:11 PM  Clinical Narrative:   [facility] reached out to Brady Edwards from the servant center to follow up about Program that he may qualify for. Left VM for her to call back.   TOC to follow for discharge                      Expected Discharge Plan and Services                                               Social Drivers of Health (SDOH) Interventions SDOH Screenings   Food Insecurity: Food Insecurity Present (04/16/2024)  Housing: High Risk (04/16/2024)  Transportation Needs: Unmet Transportation Needs (04/16/2024)  Utilities: At Risk (04/16/2024)  Depression (PHQ2-9): High Risk (02/27/2024)  Tobacco Use: High Risk (05/19/2024)    Readmission Risk Interventions     No data to display

## 2024-06-04 NOTE — Plan of Care (Signed)

## 2024-06-04 NOTE — Progress Notes (Signed)
  Progress Note   [name] FMW:[id number] DOB: 01/31/1966 DOA: 04/15/2024     46 DOS: the patient was seen and examined on 06/04/2024   Brief hospital course: 58 y.o. male with medical history significant of newly ESRD recently started on HD, atrial fibrillation, anxiety and depression, morbid obesity who presented with complaints of weakness and falls. Of note, pt was hospitalized from 04/07/24 to 04/13/24 during which he was started on dialysis, and was discharged after dialysis on 8/15 to continue outpatient dialysis TTS. Patient was found has severe orthostatic hypotension, was started on high-dose midodrine .  Currently pending nursing home placement   Principal Problem:   Orthostatic hypotension Active Problems:   General weakness   ESRD (end stage renal disease) (HCC)   Anemia of chronic disease   Depression   Clotted dialysis access   Obesity (BMI 30-39.9)   History of atrial fibrillation   Vitamin D  deficiency   Assessment and Plan: Orthostatic hypotension Increased midodrine  3 times daily to 15 mg and on Florinef  0.3 mg daily.  A.m. cortisol was normal.  Patient still drops his blood pressure quite a bit when he stands up.  Continue TED hose.  Patient still has significant orthostatic hypotension, continue to follow.   Essential hypertension. No need for blood pressure medicine at this time.   General weakness Continue working with physical therapy   ESRD (end stage renal disease) (HCC) Clotted dialysis access Anemia of end-stage renal disease. PermCath exchange by vascular surgery on 9/11 and 10/3.  Hemodialysis as per nephrology   Depression On Wellbutrin  and Effexor    Vitamin D  deficiency Supplement vitamin D    Paroxysmal atrial fibrillation Not on anticoagulation.  Holding Toprol  with low blood pressure.   Currently in sinus.   Obesity (BMI 30-39.9) class II. BMI 37.44        Subjective:  Patient was ambulating better.  No short of  breath.  Physical Exam: Vitals:   06/03/24 1545 06/03/24 1948 06/04/24 0422 06/04/24 0718  BP: (!) 134/95 (!) 160/97 (!) 136/91 (!) 132/98  Pulse: 92 88 77 81  Resp: 17 17 18 16   Temp: 98.2 F (36.8 C) 98.2 F (36.8 C) 97.9 F (36.6 C) 98.3 F (36.8 C)  TempSrc:      SpO2: 98% 99% 97% 100%  Weight:      Height:       General exam: Appears calm and comfortable  Respiratory system: Clear to auscultation. Respiratory effort normal. Cardiovascular system: S1 & S2 heard, RRR. No JVD, murmurs, rubs, gallops or clicks. No pedal edema. Gastrointestinal system: Abdomen is nondistended, soft and nontender. No organomegaly or masses felt. Normal bowel sounds heard. Central nervous system: Alert and oriented. No focal neurological deficits. Extremities: Symmetric 5 x 5 power. Skin: No rashes, lesions or ulcers Psychiatry: Judgement and insight appear normal. Mood & affect appropriate.    Data Reviewed:  There are no new results to review at this time.  Family Communication: None  Disposition: Status is: Inpatient Remains inpatient appropriate because: Unsafe discharge, pending placement.     Time spent: 25 minutes  Author: Murvin Mana, MD 06/04/2024 12:14 PM  For on call review [email or web address].

## 2024-06-05 DIAGNOSIS — I951 Orthostatic hypotension: Secondary | ICD-10-CM | POA: Diagnosis not present

## 2024-06-05 DIAGNOSIS — D638 Anemia in other chronic diseases classified elsewhere: Secondary | ICD-10-CM | POA: Diagnosis not present

## 2024-06-05 DIAGNOSIS — N186 End stage renal disease: Secondary | ICD-10-CM | POA: Diagnosis not present

## 2024-06-05 LAB — RENAL FUNCTION PANEL
Albumin: 3.6 g/dL (ref 3.5–5.0)
Anion gap: 17 — ABNORMAL HIGH (ref 5–15)
BUN: 24 mg/dL — ABNORMAL HIGH (ref 6–20)
CO2: 22 mmol/L (ref 22–32)
Calcium: 9.6 mg/dL (ref 8.9–10.3)
Chloride: 96 mmol/L — ABNORMAL LOW (ref 98–111)
Creatinine, Ser: 9.83 mg/dL — ABNORMAL HIGH (ref 0.61–1.24)
GFR, Estimated: 6 mL/min — ABNORMAL LOW (ref >60)
Glucose, Bld: 97 mg/dL (ref 70–99)
Phosphorus: 4.7 mg/dL — ABNORMAL HIGH (ref 2.5–4.6)
Potassium: 3.7 mmol/L (ref 3.5–5.1)
Sodium: 135 mmol/L (ref 135–145)

## 2024-06-05 LAB — CBC
HCT: 33.5 % — ABNORMAL LOW (ref 39.0–52.0)
Hemoglobin: 11.2 g/dL — ABNORMAL LOW (ref 13.0–17.0)
MCH: 31.5 pg (ref 26.0–34.0)
MCHC: 33.4 g/dL (ref 30.0–36.0)
MCV: 94.4 fL (ref 80.0–100.0)
Platelets: 272 K/uL (ref 150–400)
RBC: 3.55 MIL/uL — ABNORMAL LOW (ref 4.22–5.81)
RDW: 13.9 % (ref 11.5–15.5)
WBC: 6.8 K/uL (ref 4.0–10.5)
nRBC: 0 % (ref 0.0–0.2)

## 2024-06-05 MED ORDER — HEPARIN SODIUM (PORCINE) 1000 UNIT/ML DIALYSIS
25.0000 [IU]/kg | INTRAMUSCULAR | Status: DC | PRN
  Administered 2024-06-05: 2800 [IU] via INTRAVENOUS_CENTRAL
  Filled 2024-06-05: qty 3

## 2024-06-05 MED ORDER — HEPARIN SODIUM (PORCINE) 1000 UNIT/ML IJ SOLN
INTRAMUSCULAR | Status: AC
  Filled 2024-06-05: qty 4

## 2024-06-05 MED ORDER — HEPARIN SODIUM (PORCINE) 1000 UNIT/ML IJ SOLN
INTRAMUSCULAR | Status: AC
  Filled 2024-06-05: qty 3

## 2024-06-05 NOTE — Progress Notes (Signed)
 Hemodialysis Note:  Received patient in bed to unit. Alert and oriented. Informed consent singed and in chart.  Treatment initiated: 0800 Treatment completed: 1152  Access used: Right internal jugular catheter Access issues: Reversed lines. Rinsed back 8 minutes early due to system clot  Patient tolerated well. Transported back to room, alert without acute distress. Report given to patient's RN.  Total UF removed: 300 ml Medications given: None  Post HD weight: 108.9 Kg  [name]

## 2024-06-05 NOTE — Progress Notes (Signed)
 Mobility Specialist Progress Note:    06/05/24 1306  Orthostatic Lying   BP- Lying (!) 136/103  Pulse- Lying 102  Orthostatic Sitting  BP- Sitting 101/73  Pulse- Sitting 144  Orthostatic Standing at 0 minutes  BP- Standing at 0 minutes (!) 79/47  Pulse- Standing at 0 minutes 130  Mobility  Activity Ambulated with assistance  Level of Assistance Modified independent, requires aide device or extra time  Assistive Device None  Distance Ambulated (ft) 500 ft  Range of Motion/Exercises Active;All extremities  Activity Response Tolerated well  Mobility visit 1 Mobility  Mobility Specialist Start Time (ACUTE ONLY) 1303  Mobility Specialist Stop Time (ACUTE ONLY) 1331  Mobility Specialist Time Calculation (min) (ACUTE ONLY) 28 min   Ted hose, ace wraps, and abdominal binder placed prior to mobility. Pt symptomatic throughout orthostatic vitals w/ HR in the 140's, returned seated for recovery. Once recovered, BP 137/96 and HR 120 bpm sitting EOB. Upon standing, BP 105/71 and HR 112 bpm. Pt was then asx and able to ambulate >541ft. Returned to room, BP and HR stable after ambulation. RN aware, all needs met.  Sherrilee Ditty Mobility Specialist Please contact via Special educational needs teacher or  Rehab office at [phone]

## 2024-06-05 NOTE — Progress Notes (Signed)
 Contacted by Nephrology regarding outpatient HD. Will seek outpatient clinic placement. Pending placement at [facility] per [facility] request. Will continue to assist with any HD needs.   Suzen Satchel Dialysis Navigator [phone]

## 2024-06-05 NOTE — Progress Notes (Signed)
 [facility]  ROUNDING NOTE   Subjective:   Patient seen and evaluated during dialysis   HEMODIALYSIS FLOWSHEET:  Blood Flow Rate (mL/min): 349 mL/min Arterial Pressure (mmHg): -256.75 mmHg Venous Pressure (mmHg): 117.57 mmHg TMP (mmHg): 12.73 mmHg Ultrafiltration Rate (mL/min): 571 mL/min Dialysate Flow Rate (mL/min): 300 ml/min Dialysis Fluid Bolus: Normal Saline Bolus Amount (mL): 300 mL  Tolerating well seated in chair   Objective:  Vital signs in last 24 hours:  Temp:  [98.2 F (36.8 C)-98.6 F (37 C)] 98.5 F (36.9 C) (10/07 0747) Pulse Rate:  [80-115] 80 (10/07 0930) Resp:  [10-20] 18 (10/07 0930) BP: (103-147)/(78-103) 103/83 (10/07 0930) SpO2:  [96 %-100 %] 99 % (10/07 0930) Weight:  [109.5 kg] 109.5 kg (10/07 0747)  Weight change:  Filed Weights   06/02/24 0721 06/02/24 1127 06/05/24 0747  Weight: 111.2 kg 111.2 kg 109.5 kg    Intake/Output: I/O last 3 completed shifts: In: 240 [P.O.:240] Out: -    Intake/Output this shift:  No intake/output data recorded.  Physical Exam: General: NAD  Head: Normocephalic  Eyes: Anicteric  Lungs:  Clear, Room air  Heart: Regular rate   Abdomen:  Soft  Extremities:  no peripheral edema.  Neurologic: Alert  Skin: Intact  Access: Rt chest permcath    Basic Metabolic Panel: Recent Labs  Lab 05/31/24 0910 06/02/24 0730 06/05/24 0820  NA 134* 136 135  K 3.4* 3.8 3.7  CL 98 96* 96*  CO2 25 23 22   GLUCOSE 105* 108* 97  BUN 17 21* 24*  CREATININE 9.13* 9.56* 9.83*  CALCIUM  9.5 9.4 9.6  PHOS 3.7 4.4 4.7*    Liver Function Tests: Recent Labs  Lab 05/31/24 0910 06/02/24 0730 06/05/24 0820  ALBUMIN  3.8 3.5 3.6   No results for input(s): LIPASE, AMYLASE in the last 168 hours. No results for input(s): AMMONIA in the last 168 hours.  CBC: Recent Labs  Lab 05/31/24 0910 06/02/24 0730 06/05/24 0820  WBC 7.7 8.3 6.8  NEUTROABS  --  5.0  --   HGB 11.2* 10.6* 11.2*  HCT 35.0*  33.0* 33.5*  MCV 97.0 96.5 94.4  PLT 298 259 272    Cardiac Enzymes: No results for input(s): CKTOTAL, CKMB, CKMBINDEX, TROPONINI in the last 168 hours.  BNP: Invalid input(s): POCBNP  CBG: No results for input(s): GLUCAP in the last 168 hours.   Microbiology: Results for orders placed or performed during the hospital encounter of 04/07/24  Resp panel by RT-PCR (RSV, Flu A&B, Covid) Anterior Nasal Swab     Status: None   Collection Time: 04/07/24  4:46 PM   Specimen: Anterior Nasal Swab  Result Value Ref Range Status   SARS Coronavirus 2 by RT PCR NEGATIVE NEGATIVE Final    Comment: (NOTE) SARS-CoV-2 target nucleic acids are NOT DETECTED.  The SARS-CoV-2 RNA is generally detectable in upper respiratory specimens during the acute phase of infection. The lowest concentration of SARS-CoV-2 viral copies this assay can detect is 138 copies/mL. A negative result does not preclude SARS-Cov-2 infection and should not be used as the sole basis for treatment or other patient management decisions. A negative result may occur with  improper specimen collection/handling, submission of specimen other than nasopharyngeal swab, presence of viral mutation(s) within the areas targeted by this assay, and inadequate number of viral copies(<138 copies/mL). A negative result must be combined with clinical observations, patient history, and epidemiological information. The expected result is Negative.  Fact Sheet for Patients:  BloggerCourse.com  Fact Sheet for Healthcare Providers:  SeriousBroker.it  This test is no t yet approved or cleared by the [place]  FDA and  has been authorized for detection and/or diagnosis of SARS-CoV-2 by FDA under an Emergency Use Authorization (EUA). This EUA will remain  in effect (meaning this test can be used) for the duration of the COVID-19 declaration under Section 564(b)(1) of the Act,  21 U.S.C.section 360bbb-3(b)(1), unless the authorization is terminated  or revoked sooner.       Influenza A by PCR NEGATIVE NEGATIVE Final   Influenza B by PCR NEGATIVE NEGATIVE Final    Comment: (NOTE) The Xpert Xpress SARS-CoV-2/FLU/RSV plus assay is intended as an aid in the diagnosis of influenza from Nasopharyngeal swab specimens and should not be used as a sole basis for treatment. Nasal washings and aspirates are unacceptable for Xpert Xpress SARS-CoV-2/FLU/RSV testing.  Fact Sheet for Patients: BloggerCourse.com  Fact Sheet for Healthcare Providers: SeriousBroker.it  This test is not yet approved or cleared by the [place]  FDA and has been authorized for detection and/or diagnosis of SARS-CoV-2 by FDA under an Emergency Use Authorization (EUA). This EUA will remain in effect (meaning this test can be used) for the duration of the COVID-19 declaration under Section 564(b)(1) of the Act, 21 U.S.C. section 360bbb-3(b)(1), unless the authorization is terminated or revoked.     Resp Syncytial Virus by PCR NEGATIVE NEGATIVE Final    Comment: (NOTE) Fact Sheet for Patients: BloggerCourse.com  Fact Sheet for Healthcare Providers: SeriousBroker.it  This test is not yet approved or cleared by the [place]  FDA and has been authorized for detection and/or diagnosis of SARS-CoV-2 by FDA under an Emergency Use Authorization (EUA). This EUA will remain in effect (meaning this test can be used) for the duration of the COVID-19 declaration under Section 564(b)(1) of the Act, 21 U.S.C. section 360bbb-3(b)(1), unless the authorization is terminated or revoked.  Performed at [facility], [address] [place], [place] [address]     Coagulation Studies: No results for input(s): LABPROT, INR in the last 72 hours.  Urinalysis: No results for input(s):  COLORURINE, LABSPEC, PHURINE, GLUCOSEU, HGBUR, BILIRUBINUR, KETONESUR, PROTEINUR, UROBILINOGEN, NITRITE, LEUKOCYTESUR in the last 72 hours.  Invalid input(s): APPERANCEUR    Imaging: No results found.    Medications:     bisacodyl   10 mg Oral QHS   buPROPion   300 mg Oral Daily   calcitRIOL   0.25 mcg Oral Daily   Chlorhexidine  Gluconate Cloth  6 each Topical Daily   cholecalciferol   1,000 Units Oral Daily   fludrocortisone   0.3 mg Oral Daily   heparin   5,000 Units Subcutaneous Q8H   influenza vac split trivalent PF  0.5 mL Intramuscular Tomorrow-1000   midodrine   15 mg Oral TID WC   polyethylene glycol  17 g Oral BID   sevelamer  carbonate  1,600 mg Oral TID WC   venlafaxine  XR  150 mg Oral Q breakfast   And   venlafaxine  XR  75 mg Oral Q breakfast   acetaminophen , bisacodyl , butalbital -acetaminophen -caffeine , heparin , ondansetron  (ZOFRAN ) IV, ondansetron   Assessment/ Plan:  Mr. ISIDORO SANTILLANA is a 58 y.o.  male with a PMHx of hypertension, atrial fibrillation, BPH, hyperlipidemia, anxiety, constipation, morbid obesity, and end stage renal disease on hemodialysis.    [facility]- will need to resubmit for chair at discharge.    End stage renal disease on hemodialysis       Appreciate vascular performing permcath exchange on 9/11 and  10/3. Receiving dialysis well, UF 0.5L as tolerated. Next treatment scheduled for Thursday.  Dialysis navigator will seek outpatient clinic placement. Pending placement at [facility] per [facility] request.      2.  Anemia of chronic kidney disease. Hgb 11.2, NO need for ESA at this time     3.  Secondary hyperparathyroidism. PTH 199 during recent admission. Continue Renvela  1 tabs TID.           Bone minerals remain stable   4. Severe orthostatic hypotension Workup so far has included 2D echo from 04/11/2024 which shows LVEF 65 to 70%, normal LV systolic function, mild concentric LVH, normal diastolic  parameters, normal right ventricular systolic function.  A.m. cortisol level normal from 04/24/2024.  TSH normal.  Continue TEDs,and abd binders.  Continue midodrine  10 mg 3 times a day along with Florinef  to 0.4mg  daily for blood pressure support.     LOS: 47 Brady Edwards 10/7/20259:37 AM

## 2024-06-05 NOTE — Progress Notes (Signed)
  Progress Note   [name] FMW:[id number] DOB: [birth year] DOA: 04/15/2024     58 DOS: the patient was seen and examined on 06/05/2024   Brief hospital course: 58 y.o. male with medical history significant of newly ESRD recently started on HD, atrial fibrillation, anxiety and depression, morbid obesity who presented with complaints of weakness and falls. Of note, pt was hospitalized from 04/07/24 to 04/13/24 during which he was started on dialysis, and was discharged after dialysis on 8/15 to continue outpatient dialysis TTS. Patient was found has severe orthostatic hypotension, was started on high-dose midodrine .  Currently pending nursing home placement   Principal Problem:   Orthostatic hypotension Active Problems:   General weakness   ESRD (end stage renal disease) (HCC)   Anemia of chronic disease   Depression   Clotted dialysis access   Obesity (BMI 30-39.9)   History of atrial fibrillation   Vitamin D  deficiency   Assessment and Plan: Orthostatic hypotension Increased midodrine  3 times daily to 15 mg and on Florinef  0.3 mg daily.  A.m. cortisol was normal.  Patient still drops his blood pressure quite a bit when he stands up.  Continue TED hose.  Patient still has significant orthostatic hypotension, continue to follow.   Essential hypertension. No need for blood pressure medicine at this time.   General weakness Continue working with physical therapy   ESRD (end stage renal disease) (HCC) Clotted dialysis access Anemia of end-stage renal disease. PermCath exchange by vascular surgery on 9/11 and 10/3.  Hemodialysis as per nephrology   Depression On Wellbutrin  and Effexor    Vitamin D  deficiency Supplement vitamin D    Paroxysmal atrial fibrillation Not on anticoagulation.  Holding Toprol  with low blood pressure.   Currently in sinus.   Obesity (BMI 30-39.9) class II. BMI 37.44  Patient has no new issues, currently pending placement.  Hospital is  negotiating with nursing homes to have him placed with the hospital paying the cost.     Subjective:  Patient has no complaint today.  Physical Exam: Vitals:   06/05/24 1000 06/05/24 1030 06/05/24 1100 06/05/24 1130  BP: (!) 134/97 (!) 135/103 (!) 140/84 (!) 115/91  Pulse: 81 82 85 87  Resp: 19 19 16  (!) 23  Temp:      TempSrc:      SpO2: 99% 99% 100% 99%  Weight:      Height:       General exam: Appears calm and comfortable  Respiratory system: Clear to auscultation. Respiratory effort normal. Cardiovascular system: S1 & S2 heard, RRR. No JVD, murmurs, rubs, gallops or clicks. No pedal edema. Gastrointestinal system: Abdomen is nondistended, soft and nontender. No organomegaly or masses felt. Normal bowel sounds heard. Central nervous system: Alert and oriented. No focal neurological deficits. Extremities: Symmetric 5 x 5 power. Skin: No rashes, lesions or ulcers Psychiatry: Judgement and insight appear normal. Mood & affect appropriate.    Data Reviewed:  There are no new results to review at this time.  Family Communication: None  Disposition: Status is: Inpatient Remains inpatient appropriate because: Unsafe discharge, pending placement     Time spent: 25 minutes  Author: Murvin Mana, MD 06/05/2024 11:54 AM  For on call review [email or web address].

## 2024-06-06 DIAGNOSIS — N186 End stage renal disease: Secondary | ICD-10-CM | POA: Diagnosis not present

## 2024-06-06 DIAGNOSIS — I951 Orthostatic hypotension: Secondary | ICD-10-CM | POA: Diagnosis not present

## 2024-06-06 MED ORDER — FLUDROCORTISONE ACETATE 0.1 MG PO TABS
0.2000 mg | ORAL_TABLET | Freq: Every day | ORAL | Status: AC
  Administered 2024-06-06 – 2024-06-15 (×9): 0.2 mg via ORAL
  Filled 2024-06-06 (×10): qty 2

## 2024-06-06 MED ORDER — FLUDROCORTISONE ACETATE 0.1 MG PO TABS: 0.0500 mg | ORAL_TABLET | Freq: Every day | ORAL | Status: DC

## 2024-06-06 MED ORDER — FLUDROCORTISONE ACETATE 0.1 MG PO TABS
0.1000 mg | ORAL_TABLET | Freq: Every day | ORAL | Status: DC
  Administered 2024-06-16 – 2024-06-19 (×4): 0.1 mg via ORAL
  Filled 2024-06-06 (×4): qty 1

## 2024-06-06 NOTE — Progress Notes (Signed)
 Mobility Specialist Progress Note:    06/06/24 0741  Orthostatic Lying   BP- Lying (!) 130/100  Pulse- Lying 115  Orthostatic Sitting  BP- Sitting 90/69  Pulse- Sitting 127  Orthostatic Standing at 0 minutes  BP- Standing at 0 minutes (!) 76/47  Pulse- Standing at 0 minutes 137  Mobility  Activity Stood at bedside  Level of Assistance Independent  Assistive Device None  Range of Motion/Exercises Active;All extremities  Activity Response Tolerated well  Mobility visit 1 Mobility  Mobility Specialist Start Time (ACUTE ONLY) [id number]  Mobility Specialist Stop Time (ACUTE ONLY) 0752  Mobility Specialist Time Calculation (min) (ACUTE ONLY) 19 min   Ted hose, ace wraps, and abdominal binder placed prior to mobility. Pt symptomatic throughout orthostatic vital signs and returned seated to recover. After rest break, BP 125/93 and HR 122 bpm sitting EOB. Upon standing, BP 97/68 and HR 135 bpm. Deferred ambulation at this time d/t HR and symptoms. Returned pt supine, all needs met.   Sherrilee Ditty Mobility Specialist Please contact via Special educational needs teacher or  Rehab office at [phone]

## 2024-06-06 NOTE — Plan of Care (Signed)

## 2024-06-06 NOTE — Progress Notes (Signed)
 Mobility Specialist Progress Note:    06/06/24 1615  Orthostatic Lying   BP- Lying (!) 141/97  Pulse- Lying 100  Orthostatic Sitting  BP- Sitting 100/81  Pulse- Sitting 117  Orthostatic Standing at 0 minutes  BP- Standing at 0 minutes (!) 79/61  Pulse- Standing at 0 minutes 130  Mobility  Activity Ambulated with assistance  Level of Assistance Modified independent, requires aide device or extra time  Assistive Device None  Distance Ambulated (ft) 350 ft  Range of Motion/Exercises Active;All extremities  Activity Response Tolerated well  Mobility visit 1 Mobility  Mobility Specialist Start Time (ACUTE ONLY) 1611  Mobility Specialist Stop Time (ACUTE ONLY) 1628  Mobility Specialist Time Calculation (min) (ACUTE ONLY) 17 min   Ted hose, ace wraps, and abdominal binder placed prior to mobility. After recovery from orthostatics pt motivated to ambulate. Returned to room, BP 89/75 and HR 135 bpm immediately after mobility. After rest, BP 125/101 and HR 113 bpm. Left pt sitting EOB, all needs met.  Sherrilee Ditty Mobility Specialist Please contact via Special educational needs teacher or  Rehab office at [phone]

## 2024-06-06 NOTE — Progress Notes (Signed)
 PROGRESS NOTE Brady Edwards    DOB: Jun 14, 1966, 58 y.o.  FMW:[id number]    Code Status: Full Code   DOA: 04/15/2024   LOS: 48  Brief hospital course  Brady Edwards is a 58 y.o. male with a PMH significant for newly ESRD recently started on HD, atrial fibrillation, anxiety and depression, morbid obesity who presented with complaints of weakness and falls. Of note, pt was hospitalized from 04/07/24 to 04/13/24 during which he was started on dialysis, and was discharged after dialysis on 8/15 to continue outpatient dialysis TTS.  Patient currently receiving routine HD and monitoring with medication adjustments for severe orthostatic hypotension while pending nursing home placement and outpatient HD arrangements.  06/06/24 -unchanged clinical status. Endorses symptomatic orthostatics this am with abdominal binder, ted hoses in place  Assessment & Plan  Principal Problem:   Orthostatic hypotension Active Problems:   General weakness   ESRD (end stage renal disease) (HCC)   Anemia of chronic disease   Depression   Clotted dialysis access   Obesity (BMI 30-39.9)   History of atrial fibrillation   Vitamin D  deficiency  Symptomatic Orthostatic hypotension- patient has not been getting midodrine  due to his hypertension at rest. I have discontinued it - continue on florinef  0.3mg  daily with very slow taper with optimism that his symptoms will improve with ongoing physical therapy and adjustments to HD.  - Continue TED hose, abdominal binder, lifestyle changes - continue PT/OT, mobilizing patient as much as possible    General weakness Continue working with physical therapy   ESRD (end stage renal disease) (HCC) Clotted dialysis access Anemia of end-stage renal disease. PermCath exchange by vascular surgery on 9/11 and 10/3.  Hemodialysis as per nephrology   Depression On Wellbutrin  and Effexor    Vitamin D  deficiency Supplement vitamin D    Paroxysmal atrial fibrillation Not on  anticoagulation. Holding Toprol  with low blood pressure.   Currently in sinus.   Obesity (BMI 30-39.9) class II. BMI 37.44  Body mass index is 36.5 kg/m.  VTE ppx: Place TED hose Start: 04/18/24 1519 heparin  injection 5,000 Units Start: 04/15/24 2200  Diet:     Diet   Diet renal with fluid restriction Fluid consistency: Thin   Consultants: Nephrology  Vascular surgery   Subjective 06/06/24    Pt reports no changes in his clinical status. Still awaiting placement. Orthostatic symptoms resolved once returned back to laying position   Objective  Blood pressure (!) 156/110, pulse 65, temperature 97.9 F (36.6 C), resp. rate 17, height 5' 8 (1.727 m), weight 108.9 kg, SpO2 96%.  Intake/Output Summary (Last 24 hours) at 06/06/2024 0818 Last data filed at 06/05/2024 1155 Gross per 24 hour  Intake --  Output 300 ml  Net -300 ml   Filed Weights   06/02/24 1127 06/05/24 0747 06/05/24 1155  Weight: 111.2 kg 109.5 kg 108.9 kg    Physical Exam:  General: awake, alert, NAD HEENT: atraumatic, clear conjunctiva, anicteric sclera, MMM, hearing grossly normal Respiratory: normal respiratory effort. Cardiovascular: extremities well perfused, quick capillary refill, normal S1/S2, RRR, no JVD, murmurs Gastrointestinal: soft, NT, ND Nervous: A&O x3. no gross focal neurologic deficits, normal speech Extremities: moves all equally, no edema, normal tone Skin: dry, intact, normal temperature, normal color. No rashes, lesions or ulcers on exposed skin Psychiatry: normal mood, congruent affect  Labs   I have personally reviewed the following labs and imaging studies CBC    Component Value Date/Time   WBC 6.8 06/05/2024 0820   RBC  3.55 (L) 06/05/2024 0820   HGB 11.2 (L) 06/05/2024 0820   HGB 16.3 02/12/2021 1604   HCT 33.5 (L) 06/05/2024 0820   HCT 47.9 02/12/2021 1604   PLT 272 06/05/2024 0820   PLT 252 02/12/2021 1604   MCV 94.4 06/05/2024 0820   MCV 92 02/12/2021 1604   MCH  31.5 06/05/2024 0820   MCHC 33.4 06/05/2024 0820   RDW 13.9 06/05/2024 0820   RDW 13.2 02/12/2021 1604   LYMPHSABS 2.1 06/02/2024 0730   LYMPHSABS 2.7 02/12/2021 1604   MONOABS 0.9 06/02/2024 0730   EOSABS 0.1 06/02/2024 0730   EOSABS 0.3 02/12/2021 1604   BASOSABS 0.1 06/02/2024 0730   BASOSABS 0.1 02/12/2021 1604      Latest Ref Rng & Units 06/05/2024    8:20 AM 06/02/2024    7:30 AM 05/31/2024    9:10 AM  BMP  Glucose 70 - 99 mg/dL 97  891  894   BUN 6 - 20 mg/dL 24  21  17    Creatinine 0.61 - 1.24 mg/dL 0.16  0.43  0.86   Sodium 135 - 145 mmol/L 135  136  134   Potassium 3.5 - 5.1 mmol/L 3.7  3.8  3.4   Chloride 98 - 111 mmol/L 96  96  98   CO2 22 - 32 mmol/L 22  23  25    Calcium  8.9 - 10.3 mg/dL 9.6  9.4  9.5    No results found.  Disposition Plan & Communication  Patient status: Inpatient  Admitted From: Home Planned disposition location: Skilled nursing facility Anticipated discharge date: TBD pending placement   Family Communication: none at bedside     Author: Marien LITTIE Piety, DO [facility] 06/06/2024, 8:18 AM   Available by Epic secure chat 7AM-7PM. If 7PM-7AM, please contact night-coverage.  TRH contact information found on [email or web address].

## 2024-06-06 NOTE — TOC Progression Note (Addendum)
 Transition of Care [facility]) - Progression Note    Patient Details  Name: Brady Edwards MRN: [id number] Date of Birth: [birth year]  Transition of Care [facility]) CM/SW Contact  Alvaro Louder, [place] Phone Number: 06/06/2024, 11:52 AM  Clinical Narrative:  LCSWA spoke to Karna Rosier to follow up about Program that patient may qualify for. Name of program is PDP ([facility] Program). She indicated that she is still waiting to hear back from the [place] to find out if he qualifies. For this program and would call me back if she hears an answer back.   Another alternative for this patient is to admit to [facility]. LCSWA has contacted the Dialysis coordinator. She has started the referral for patient to admit to [facility] and complete Dialysis at the [facility] in [place]. Waiting to hear back regarding the referral.   TOC to follow for discharge                       Expected Discharge Plan and Services                                               Social Drivers of Health (SDOH) Interventions SDOH Screenings   Food Insecurity: Food Insecurity Present (04/16/2024)  Housing: High Risk (04/16/2024)  Transportation Needs: Unmet Transportation Needs (04/16/2024)  Utilities: At Risk (04/16/2024)  Depression (PHQ2-9): High Risk (02/27/2024)  Tobacco Use: High Risk (05/19/2024)    Readmission Risk Interventions     No data to display

## 2024-06-07 DIAGNOSIS — I951 Orthostatic hypotension: Secondary | ICD-10-CM | POA: Diagnosis not present

## 2024-06-07 DIAGNOSIS — N186 End stage renal disease: Secondary | ICD-10-CM | POA: Diagnosis not present

## 2024-06-07 LAB — CBC
HCT: 32.5 % — ABNORMAL LOW (ref 39.0–52.0)
Hemoglobin: 11 g/dL — ABNORMAL LOW (ref 13.0–17.0)
MCH: 31.7 pg (ref 26.0–34.0)
MCHC: 33.8 g/dL (ref 30.0–36.0)
MCV: 93.7 fL (ref 80.0–100.0)
Platelets: 255 K/uL (ref 150–400)
RBC: 3.47 MIL/uL — ABNORMAL LOW (ref 4.22–5.81)
RDW: 13.5 % (ref 11.5–15.5)
WBC: 7 K/uL (ref 4.0–10.5)
nRBC: 0 % (ref 0.0–0.2)

## 2024-06-07 LAB — RENAL FUNCTION PANEL
Albumin: 4.1 g/dL (ref 3.5–5.0)
Anion gap: 13 (ref 5–15)
BUN: 23 mg/dL — ABNORMAL HIGH (ref 6–20)
CO2: 23 mmol/L (ref 22–32)
Calcium: 9.7 mg/dL (ref 8.9–10.3)
Chloride: 94 mmol/L — ABNORMAL LOW (ref 98–111)
Creatinine, Ser: 8.49 mg/dL — ABNORMAL HIGH (ref 0.61–1.24)
GFR, Estimated: 7 mL/min — ABNORMAL LOW (ref >60)
Glucose, Bld: 89 mg/dL (ref 70–99)
Phosphorus: 5 mg/dL — ABNORMAL HIGH (ref 2.5–4.6)
Potassium: 4.2 mmol/L (ref 3.5–5.1)
Sodium: 130 mmol/L — ABNORMAL LOW (ref 135–145)

## 2024-06-07 MED ORDER — ALTEPLASE 2 MG IJ SOLR: 2.0000 mg | Freq: Once | INTRAMUSCULAR | Status: DC | PRN

## 2024-06-07 MED ORDER — HEPARIN SODIUM (PORCINE) 1000 UNIT/ML IJ SOLN
INTRAMUSCULAR | Status: AC
  Filled 2024-06-07: qty 7

## 2024-06-07 MED ORDER — HEPARIN SODIUM (PORCINE) 1000 UNIT/ML DIALYSIS
25.0000 [IU]/kg | INTRAMUSCULAR | Status: DC | PRN
Start: 2024-06-07 — End: 2024-06-07

## 2024-06-07 MED ORDER — HEPARIN SODIUM (PORCINE) 1000 UNIT/ML DIALYSIS: 1000.0000 [IU] | INTRAMUSCULAR | Status: DC | PRN

## 2024-06-07 NOTE — Progress Notes (Signed)
 Submitted referral for [facility].  Unfortunately there are no chairs available, but they do have a chair for [facility].  Reached out to [facility] to see if this was okay.  Waiting for a response from [facility].  Suzen Satchel Dialysis Navigator [phone]

## 2024-06-07 NOTE — Progress Notes (Signed)
 Brady Edwards    DOB: 1966/02/07, 58 y.o.  FMW:[id number]    Code Status: Full Code   DOA: 04/15/2024   LOS: 49  Brief hospital course  Brady Edwards is a 58 y.o. male with a PMH significant for newly ESRD recently started on HD, atrial fibrillation, anxiety and depression, morbid obesity who presented with complaints of weakness and falls. Of note, pt was hospitalized from 04/07/24 to 04/13/24 during which he was started on dialysis, and was discharged after dialysis on 8/15 to continue outpatient dialysis TTS.  Patient currently receiving routine HD and monitoring with medication adjustments for severe orthostatic hypotension while pending nursing home placement and outpatient HD arrangements.  06/07/24 -unchanged clinical status. Has not been out of bed yet today. Scheduled for HD. No orthostatic symptoms at rest  Assessment & Plan  Principal Problem:   Orthostatic hypotension Active Problems:   General weakness   ESRD (end stage renal disease) (HCC)   Anemia of chronic disease   Depression   Clotted dialysis access   Obesity (BMI 30-39.9)   History of atrial fibrillation   Vitamin D  deficiency  Symptomatic Orthostatic hypotension- patient has not been getting midodrine  due to his hypertension at rest. I have discontinued it - continue on florinef  0.3mg  daily with very slow taper with optimism that his symptoms will improve with ongoing physical therapy and adjustments to HD.  - Continue TED hose, abdominal binder, lifestyle changes - continue PT/OT, mobilizing patient as much as possible    General weakness Continue working with physical therapy   ESRD (end stage renal disease) (HCC) Clotted dialysis access Anemia of end-stage renal disease. PermCath exchange by vascular surgery on 9/11 and 10/3.  Hemodialysis as per nephrology   Depression On Wellbutrin  and Effexor    Vitamin D  deficiency Supplement vitamin D    Paroxysmal atrial fibrillation Not on  anticoagulation. Holding Toprol  with low blood pressure.   Currently in sinus.   Obesity (BMI 30-39.9) class II. BMI 37.44  Body mass index is 36.5 kg/m.  VTE ppx: Place TED hose Start: 04/18/24 1519 heparin  injection 5,000 Units Start: 04/15/24 2200  Diet:     Diet   Diet regular Room service appropriate? Yes; Fluid consistency: Thin   Consultants: Nephrology  Vascular surgery   Subjective 06/07/24    Pt reports no changes in his clinical status. Still awaiting placement. No complaints today.    Objective  Blood pressure (!) 156/110, pulse 65, temperature 97.9 F (36.6 C), resp. rate 17, height 5' 8 (1.727 m), weight 108.9 kg, SpO2 96%. No intake or output data in the 24 hours ending 06/07/24 0738  Filed Weights   06/02/24 1127 06/05/24 0747 06/05/24 1155  Weight: 111.2 kg 109.5 kg 108.9 kg    Physical Exam:  General: awake, alert, NAD HEENT: atraumatic, clear conjunctiva, anicteric sclera, MMM, hearing grossly normal Respiratory: normal respiratory effort. Cardiovascular: extremities well perfused Nervous: A&O x3. no gross focal neurologic deficits, normal speech Extremities: moves all equally, no edema, normal tone Skin: dry, intact, normal temperature, normal color. No rashes, lesions or ulcers on exposed skin Psychiatry: normal mood, congruent affect  Labs   I have personally reviewed the following labs and imaging studies CBC    Component Value Date/Time   WBC 6.8 06/05/2024 0820   RBC 3.55 (L) 06/05/2024 0820   HGB 11.2 (L) 06/05/2024 0820   HGB 16.3 02/12/2021 1604   HCT 33.5 (L) 06/05/2024 0820   HCT 47.9 02/12/2021 1604  PLT 272 06/05/2024 0820   PLT 252 02/12/2021 1604   MCV 94.4 06/05/2024 0820   MCV 92 02/12/2021 1604   MCH 31.5 06/05/2024 0820   MCHC 33.4 06/05/2024 0820   RDW 13.9 06/05/2024 0820   RDW 13.2 02/12/2021 1604   LYMPHSABS 2.1 06/02/2024 0730   LYMPHSABS 2.7 02/12/2021 1604   MONOABS 0.9 06/02/2024 0730   EOSABS 0.1  06/02/2024 0730   EOSABS 0.3 02/12/2021 1604   BASOSABS 0.1 06/02/2024 0730   BASOSABS 0.1 02/12/2021 1604      Latest Ref Rng & Units 06/07/2024    4:33 AM 06/05/2024    8:20 AM 06/02/2024    7:30 AM  BMP  Glucose 70 - 99 mg/dL 89  97  891   BUN 6 - 20 mg/dL 23  24  21    Creatinine 0.61 - 1.24 mg/dL 1.50  0.16  0.43   Sodium 135 - 145 mmol/L 130  135  136   Potassium 3.5 - 5.1 mmol/L 4.2  3.7  3.8   Chloride 98 - 111 mmol/L 94  96  96   CO2 22 - 32 mmol/L 23  22  23    Calcium  8.9 - 10.3 mg/dL 9.7  9.6  9.4    No results found.  Disposition Plan & Communication  Patient status: Inpatient  Admitted From: Home Planned disposition location: Skilled nursing facility Anticipated discharge date: TBD pending placement   Family Communication: none at bedside     Author: Marien LITTIE Piety, DO [facility] 06/07/2024, 7:38 AM   Available by Epic secure chat 7AM-7PM. If 7PM-7AM, please contact night-coverage.  TRH contact information found on [email or web address].

## 2024-06-07 NOTE — Progress Notes (Signed)
 [facility]  ROUNDING NOTE   Subjective:   Patient seen sitting at bedside Room air Will receive dialysis later today Able to ambulate in hall   Objective:  Vital signs in last 24 hours:  Temp:  [97.6 F (36.4 C)-98.4 F (36.9 C)] 97.7 F (36.5 C) (10/09 0752) Pulse Rate:  [92-101] 92 (10/09 0752) Resp:  [17-18] 17 (10/09 0752) BP: (129-154)/(84-104) 154/104 (10/09 0752) SpO2:  [97 %-100 %] 100 % (10/09 0752)  Weight change:  Filed Weights   06/02/24 1127 06/05/24 0747 06/05/24 1155  Weight: 111.2 kg 109.5 kg 108.9 kg    Intake/Output: No intake/output data recorded.   Intake/Output this shift:  No intake/output data recorded.  Physical Exam: General: NAD  Head: Normocephalic  Eyes: Anicteric  Lungs:  Clear, Room air  Heart: Regular rate   Abdomen:  Soft  Extremities:  no peripheral edema.  Neurologic: Alert  Skin: Intact  Access: Rt chest permcath    Basic Metabolic Panel: Recent Labs  Lab 06/02/24 0730 06/05/24 0820 06/07/24 0433  NA 136 135 130*  K 3.8 3.7 4.2  CL 96* 96* 94*  CO2 23 22 23   GLUCOSE 108* 97 89  BUN 21* 24* 23*  CREATININE 9.56* 9.83* 8.49*  CALCIUM  9.4 9.6 9.7  PHOS 4.4 4.7* 5.0*    Liver Function Tests: Recent Labs  Lab 06/02/24 0730 06/05/24 0820 06/07/24 0433  ALBUMIN  3.5 3.6 4.1   No results for input(s): LIPASE, AMYLASE in the last 168 hours. No results for input(s): AMMONIA in the last 168 hours.  CBC: Recent Labs  Lab 06/02/24 0730 06/05/24 0820  WBC 8.3 6.8  NEUTROABS 5.0  --   HGB 10.6* 11.2*  HCT 33.0* 33.5*  MCV 96.5 94.4  PLT 259 272    Cardiac Enzymes: No results for input(s): CKTOTAL, CKMB, CKMBINDEX, TROPONINI in the last 168 hours.  BNP: Invalid input(s): POCBNP  CBG: No results for input(s): GLUCAP in the last 168 hours.   Microbiology: Results for orders placed or performed during the hospital encounter of 04/07/24  Resp panel by RT-PCR (RSV, Flu  A&B, Covid) Anterior Nasal Swab     Status: None   Collection Time: 04/07/24  4:46 PM   Specimen: Anterior Nasal Swab  Result Value Ref Range Status   SARS Coronavirus 2 by RT PCR NEGATIVE NEGATIVE Final    Comment: (NOTE) SARS-CoV-2 target nucleic acids are NOT DETECTED.  The SARS-CoV-2 RNA is generally detectable in upper respiratory specimens during the acute phase of infection. The lowest concentration of SARS-CoV-2 viral copies this assay can detect is 138 copies/mL. A negative result does not preclude SARS-Cov-2 infection and should not be used as the sole basis for treatment or other patient management decisions. A negative result may occur with  improper specimen collection/handling, submission of specimen other than nasopharyngeal swab, presence of viral mutation(s) within the areas targeted by this assay, and inadequate number of viral copies(<138 copies/mL). A negative result must be combined with clinical observations, patient history, and epidemiological information. The expected result is Negative.  Fact Sheet for Patients:  BloggerCourse.com  Fact Sheet for Healthcare Providers:  SeriousBroker.it  This test is no t yet approved or cleared by the [place]  FDA and  has been authorized for detection and/or diagnosis of SARS-CoV-2 by FDA under an Emergency Use Authorization (EUA). This EUA will remain  in effect (meaning this test can be used) for the duration of the COVID-19 declaration under Section 564(b)(1) of the  Act, 21 U.S.C.section 360bbb-3(b)(1), unless the authorization is terminated  or revoked sooner.       Influenza A by PCR NEGATIVE NEGATIVE Final   Influenza B by PCR NEGATIVE NEGATIVE Final    Comment: (NOTE) The Xpert Xpress SARS-CoV-2/FLU/RSV plus assay is intended as an aid in the diagnosis of influenza from Nasopharyngeal swab specimens and should not be used as a sole basis for treatment.  Nasal washings and aspirates are unacceptable for Xpert Xpress SARS-CoV-2/FLU/RSV testing.  Fact Sheet for Patients: BloggerCourse.com  Fact Sheet for Healthcare Providers: SeriousBroker.it  This test is not yet approved or cleared by the [place]  FDA and has been authorized for detection and/or diagnosis of SARS-CoV-2 by FDA under an Emergency Use Authorization (EUA). This EUA will remain in effect (meaning this test can be used) for the duration of the COVID-19 declaration under Section 564(b)(1) of the Act, 21 U.S.C. section 360bbb-3(b)(1), unless the authorization is terminated or revoked.     Resp Syncytial Virus by PCR NEGATIVE NEGATIVE Final    Comment: (NOTE) Fact Sheet for Patients: BloggerCourse.com  Fact Sheet for Healthcare Providers: SeriousBroker.it  This test is not yet approved or cleared by the [place]  FDA and has been authorized for detection and/or diagnosis of SARS-CoV-2 by FDA under an Emergency Use Authorization (EUA). This EUA will remain in effect (meaning this test can be used) for the duration of the COVID-19 declaration under Section 564(b)(1) of the Act, 21 U.S.C. section 360bbb-3(b)(1), unless the authorization is terminated or revoked.  Performed at [facility], [address] [place], [place] [address]     Coagulation Studies: No results for input(s): LABPROT, INR in the last 72 hours.  Urinalysis: No results for input(s): COLORURINE, LABSPEC, PHURINE, GLUCOSEU, HGBUR, BILIRUBINUR, KETONESUR, PROTEINUR, UROBILINOGEN, NITRITE, LEUKOCYTESUR in the last 72 hours.  Invalid input(s): APPERANCEUR    Imaging: No results found.    Medications:     bisacodyl   10 mg Oral QHS   buPROPion   300 mg Oral Daily   calcitRIOL   0.25 mcg Oral Daily   Chlorhexidine  Gluconate Cloth  6 each Topical  Daily   cholecalciferol   1,000 Units Oral Daily   fludrocortisone   0.2 mg Oral Daily   Followed by   NOREEN ON 06/16/2024] fludrocortisone   0.1 mg Oral Daily   Followed by   NOREEN ON 06/30/2024] fludrocortisone   0.05 mg Oral Daily   heparin   5,000 Units Subcutaneous Q8H   influenza vac split trivalent PF  0.5 mL Intramuscular Tomorrow-1000   polyethylene glycol  17 g Oral BID   sevelamer  carbonate  1,600 mg Oral TID WC   venlafaxine  XR  150 mg Oral Q breakfast   And   venlafaxine  XR  75 mg Oral Q breakfast   acetaminophen , bisacodyl , butalbital -acetaminophen -caffeine , ondansetron  (ZOFRAN ) IV, ondansetron   Assessment/ Plan:  [name] is a 58 y.o.  male with a PMHx of hypertension, atrial fibrillation, BPH, hyperlipidemia, anxiety, constipation, morbid obesity, and end stage renal disease on hemodialysis.    [facility]- will need to resubmit for chair at discharge.    End stage renal disease on hemodialysis       Appreciate vascular performing permcath exchange on 9/11 and 10/3.  Scheduled to receive dialysis later today Dialysis navigator will seek outpatient clinic placement. [facility] has no chairs available. Awaiting [facility] approval to direct to other clinic.    2.  Anemia of chronic kidney disease. Hgb 11.2, No need for ESA at  this time     3.  Secondary hyperparathyroidism. PTH 199 during recent admission. Continue Renvela  1 tabs TID.           Calcium  and phos within optimal range.Continue sevelamer  with meals.   4. Severe orthostatic hypotension Workup so far has included 2D echo from 04/11/2024 which shows LVEF 65 to 70%, normal LV systolic function, mild concentric LVH, normal diastolic parameters, normal right ventricular systolic function.  A.m. cortisol level normal from 04/24/2024.  TSH normal.  Continue TEDs,and abd binders. Midodrine  has been stopped and undergoing Florinef  taper. Blood pressure 154/104    LOS: 49 Brady Edwards 10/9/202511:55 AM

## 2024-06-07 NOTE — Progress Notes (Signed)
  Received patient in bed to unit.   Informed consent signed and in chart.    TX duration: 3.5hrs     Transported back to floor Hand-off given to patient's nurse. No acute distress noted    Access used: R HD Catheter  Access issues: none   Total UF removed: none Medication(s) given: none Post HD VS: wnl    Olivia Hurst LPN [facility]

## 2024-06-08 DIAGNOSIS — N186 End stage renal disease: Secondary | ICD-10-CM | POA: Diagnosis not present

## 2024-06-08 DIAGNOSIS — I951 Orthostatic hypotension: Secondary | ICD-10-CM | POA: Diagnosis not present

## 2024-06-08 NOTE — Plan of Care (Signed)

## 2024-06-08 NOTE — Progress Notes (Signed)
 Mobility Specialist - Progress Note   06/08/24 1500  Orthostatic Lying   BP- Lying (!) 128/96  Pulse- Lying 99  Orthostatic Sitting  BP- Sitting 92/71  Pulse- Sitting 115  Orthostatic Standing at 0 minutes  BP- Standing at 0 minutes 101/70  Pulse- Standing at 0 minutes 118  Orthostatic Standing at 3 minutes  BP- Standing at 3 minutes 113/89  Pulse- Standing at 3 minutes 119  Oxygen Therapy  SpO2 100 %  O2 Device Room Air  Mobility  Activity Ambulated with assistance  Level of Assistance Independent after set-up  Assistive Device None  Distance Ambulated (ft) 325 ft  Range of Motion/Exercises Active  Activity Response Tolerated well  Mobility visit 1 Mobility  Mobility Specialist Start Time (ACUTE ONLY) 1425  Mobility Specialist Stop Time (ACUTE ONLY) 1443  Mobility Specialist Time Calculation (min) (ACUTE ONLY) 18 min   Pt was supine in bed upon entry. Pt agreed to mobility. Pt is Orthostatic and vitals were taking throughout activity. Pt was able to EOB independently with bed features. Pt is able to STS independently. Pt ambulated well. Pt didn't need recovery break throughout activity. Pt stated he felt fine and after activity might be nauseous. After activity pt returned to the room in bed with all needs in reach.  Clem Rodes Mobility Specialist 06/08/24, 3:42 PM

## 2024-06-08 NOTE — Progress Notes (Signed)
 PROGRESS NOTE Brady Edwards    DOB: May 24, 1966, 58 y.o.  FMW:[id number]    Code Status: Full Code   DOA: 04/15/2024   LOS: 50  Brief hospital course  Brady Edwards is a 58 y.o. male with a PMH significant for newly ESRD recently started on HD, atrial fibrillation, anxiety and depression, morbid obesity who presented with complaints of weakness and falls. Of note, pt was hospitalized from 04/07/24 to 04/13/24 during which he was started on dialysis, and was discharged after dialysis on 8/15 to continue outpatient dialysis TTS.  Patient currently receiving routine HD and monitoring with medication adjustments for severe orthostatic hypotension while pending nursing home placement and outpatient HD arrangements.  06/08/24 -unchanged clinical status. Was able to walk in hallway some today. Orthostatic symptoms with standing still and improved with rest.   Assessment & Plan  Principal Problem:   Orthostatic hypotension Active Problems:   General weakness   ESRD (end stage renal disease) (HCC)   Anemia of chronic disease   Depression   Clotted dialysis access   Obesity (BMI 30-39.9)   History of atrial fibrillation   Vitamin D  deficiency  Symptomatic Orthostatic hypotension- patient has not been getting midodrine  due to his hypertension at rest. I have discontinued it - continue on florinef  0.3mg  daily with very slow taper with optimism that his symptoms will improve with ongoing physical therapy and adjustments to HD.  - Continue TED hose, abdominal binder, lifestyle changes - continue PT/OT, mobilizing patient as much as possible    General weakness Continue working with physical therapy   ESRD (end stage renal disease) (HCC) TThS Clotted dialysis access Anemia of end-stage renal disease. PermCath exchange by vascular surgery on 9/11 and 10/3.  Hemodialysis as per nephrology   Depression On Wellbutrin  and Effexor    Vitamin D  deficiency Supplement vitamin D    Paroxysmal  atrial fibrillation Not on anticoagulation. Holding Toprol  with low blood pressure.   Currently in sinus.   Obesity (BMI 30-39.9) class II. BMI 37.44  Body mass index is 36.44 kg/m.  VTE ppx: heparin  injection 5,000 Units Start: 04/15/24 2200  Diet:     Diet   Diet regular Room service appropriate? Yes; Fluid consistency: Thin   Consultants: Nephrology  Vascular surgery   Subjective 06/08/24    Pt reports no changes in his clinical status. Still awaiting placement. No complaints today.    Objective  Blood pressure (!) 156/110, pulse 65, temperature 97.9 F (36.6 C), resp. rate 17, height 5' 8 (1.727 m), weight 108.9 kg, SpO2 96%.  Intake/Output Summary (Last 24 hours) at 06/08/2024 0802 Last data filed at 06/08/2024 0500 Gross per 24 hour  Intake 460 ml  Output 0 ml  Net 460 ml    Filed Weights   06/05/24 0747 06/05/24 1155 06/07/24 1251  Weight: 109.5 kg 108.9 kg 108.7 kg    Physical Exam:  General: awake, alert, NAD HEENT: atraumatic, clear conjunctiva, anicteric sclera, MMM, hearing grossly normal Respiratory: normal respiratory effort. Cardiovascular: extremities well perfused Nervous: A&O x3. no gross focal neurologic deficits, normal speech Extremities: moves all equally, no edema, normal tone Skin: dry, intact, normal temperature, normal color. No rashes, lesions or ulcers on exposed skin Psychiatry: normal mood, congruent affect  Labs   I have personally reviewed the following labs and imaging studies CBC    Component Value Date/Time   WBC 7.0 06/07/2024 1254   RBC 3.47 (L) 06/07/2024 1254   HGB 11.0 (L) 06/07/2024 1254   HGB  16.3 02/12/2021 1604   HCT 32.5 (L) 06/07/2024 1254   HCT 47.9 02/12/2021 1604   PLT 255 06/07/2024 1254   PLT 252 02/12/2021 1604   MCV 93.7 06/07/2024 1254   MCV 92 02/12/2021 1604   MCH 31.7 06/07/2024 1254   MCHC 33.8 06/07/2024 1254   RDW 13.5 06/07/2024 1254   RDW 13.2 02/12/2021 1604   LYMPHSABS 2.1 06/02/2024  0730   LYMPHSABS 2.7 02/12/2021 1604   MONOABS 0.9 06/02/2024 0730   EOSABS 0.1 06/02/2024 0730   EOSABS 0.3 02/12/2021 1604   BASOSABS 0.1 06/02/2024 0730   BASOSABS 0.1 02/12/2021 1604      Latest Ref Rng & Units 06/07/2024    4:33 AM 06/05/2024    8:20 AM 06/02/2024    7:30 AM  BMP  Glucose 70 - 99 mg/dL 89  97  891   BUN 6 - 20 mg/dL 23  24  21    Creatinine 0.61 - 1.24 mg/dL 1.50  0.16  0.43   Sodium 135 - 145 mmol/L 130  135  136   Potassium 3.5 - 5.1 mmol/L 4.2  3.7  3.8   Chloride 98 - 111 mmol/L 94  96  96   CO2 22 - 32 mmol/L 23  22  23    Calcium  8.9 - 10.3 mg/dL 9.7  9.6  9.4    No results found.  Disposition Plan & Communication  Patient status: Inpatient  Admitted From: Home Planned disposition location: Skilled nursing facility Anticipated discharge date: TBD pending placement   Family Communication: none at bedside     Author: Marien LITTIE Piety, DO [facility] 06/08/2024, 8:02 AM   Available by Epic secure chat 7AM-7PM. If 7PM-7AM, please contact night-coverage.  [facility] contact information found on [email or web address].

## 2024-06-08 NOTE — Progress Notes (Signed)
 [facility]  ROUNDING NOTE   Subjective:   Patient seen laying in bed Alert States he has ambulated in the hall.  No complaints to offer  Objective:  Vital signs in last 24 hours:  Temp:  [98 F (36.7 C)-98.6 F (37 C)] 98 F (36.7 C) (10/10 0448) Pulse Rate:  [88-97] 94 (10/10 0448) Resp:  [14-23] 17 (10/10 0448) BP: (118-139)/(86-98) 123/86 (10/10 0448) SpO2:  [96 %-100 %] 100 % (10/10 0739) Weight:  [108.7 kg] 108.7 kg (10/09 1251)  Weight change:  Filed Weights   06/05/24 0747 06/05/24 1155 06/07/24 1251  Weight: 109.5 kg 108.9 kg 108.7 kg    Intake/Output: I/O last 3 completed shifts: In: 510 [P.O.:510] Out: 0    Intake/Output this shift:  No intake/output data recorded.  Physical Exam: General: NAD  Head: Normocephalic  Eyes: Anicteric  Lungs:  Clear, Room air  Heart: Regular rate   Abdomen:  Soft  Extremities:  no peripheral edema.  Neurologic: Alert  Skin: Intact  Access: Rt chest permcath    Basic Metabolic Panel: Recent Labs  Lab 06/02/24 0730 06/05/24 0820 06/07/24 0433  NA 136 135 130*  K 3.8 3.7 4.2  CL 96* 96* 94*  CO2 23 22 23   GLUCOSE 108* 97 89  BUN 21* 24* 23*  CREATININE 9.56* 9.83* 8.49*  CALCIUM  9.4 9.6 9.7  PHOS 4.4 4.7* 5.0*    Liver Function Tests: Recent Labs  Lab 06/02/24 0730 06/05/24 0820 06/07/24 0433  ALBUMIN  3.5 3.6 4.1   No results for input(s): LIPASE, AMYLASE in the last 168 hours. No results for input(s): AMMONIA in the last 168 hours.  CBC: Recent Labs  Lab 06/02/24 0730 06/05/24 0820 06/07/24 1254  WBC 8.3 6.8 7.0  NEUTROABS 5.0  --   --   HGB 10.6* 11.2* 11.0*  HCT 33.0* 33.5* 32.5*  MCV 96.5 94.4 93.7  PLT 259 272 255    Cardiac Enzymes: No results for input(s): CKTOTAL, CKMB, CKMBINDEX, TROPONINI in the last 168 hours.  BNP: Invalid input(s): POCBNP  CBG: No results for input(s): GLUCAP in the last 168 hours.   Microbiology: Results for orders  placed or performed during the hospital encounter of 04/07/24  Resp panel by RT-PCR (RSV, Flu A&B, Covid) Anterior Nasal Swab     Status: None   Collection Time: 04/07/24  4:46 PM   Specimen: Anterior Nasal Swab  Result Value Ref Range Status   SARS Coronavirus 2 by RT PCR NEGATIVE NEGATIVE Final    Comment: (NOTE) SARS-CoV-2 target nucleic acids are NOT DETECTED.  The SARS-CoV-2 RNA is generally detectable in upper respiratory specimens during the acute phase of infection. The lowest concentration of SARS-CoV-2 viral copies this assay can detect is 138 copies/mL. A negative result does not preclude SARS-Cov-2 infection and should not be used as the sole basis for treatment or other patient management decisions. A negative result may occur with  improper specimen collection/handling, submission of specimen other than nasopharyngeal swab, presence of viral mutation(s) within the areas targeted by this assay, and inadequate number of viral copies(<138 copies/mL). A negative result must be combined with clinical observations, patient history, and epidemiological information. The expected result is Negative.  Fact Sheet for Patients:  BloggerCourse.com  Fact Sheet for Healthcare Providers:  SeriousBroker.it  This test is no t yet approved or cleared by the [place]  FDA and  has been authorized for detection and/or diagnosis of SARS-CoV-2 by FDA under an Emergency Use Authorization (EUA).  This EUA will remain  in effect (meaning this test can be used) for the duration of the COVID-19 declaration under Section 564(b)(1) of the Act, 21 U.S.C.section 360bbb-3(b)(1), unless the authorization is terminated  or revoked sooner.       Influenza A by PCR NEGATIVE NEGATIVE Final   Influenza B by PCR NEGATIVE NEGATIVE Final    Comment: (NOTE) The Xpert Xpress SARS-CoV-2/FLU/RSV plus assay is intended as an aid in the diagnosis of  influenza from Nasopharyngeal swab specimens and should not be used as a sole basis for treatment. Nasal washings and aspirates are unacceptable for Xpert Xpress SARS-CoV-2/FLU/RSV testing.  Fact Sheet for Patients: BloggerCourse.com  Fact Sheet for Healthcare Providers: SeriousBroker.it  This test is not yet approved or cleared by the [place]  FDA and has been authorized for detection and/or diagnosis of SARS-CoV-2 by FDA under an Emergency Use Authorization (EUA). This EUA will remain in effect (meaning this test can be used) for the duration of the COVID-19 declaration under Section 564(b)(1) of the Act, 21 U.S.C. section 360bbb-3(b)(1), unless the authorization is terminated or revoked.     Resp Syncytial Virus by PCR NEGATIVE NEGATIVE Final    Comment: (NOTE) Fact Sheet for Patients: BloggerCourse.com  Fact Sheet for Healthcare Providers: SeriousBroker.it  This test is not yet approved or cleared by the [place]  FDA and has been authorized for detection and/or diagnosis of SARS-CoV-2 by FDA under an Emergency Use Authorization (EUA). This EUA will remain in effect (meaning this test can be used) for the duration of the COVID-19 declaration under Section 564(b)(1) of the Act, 21 U.S.C. section 360bbb-3(b)(1), unless the authorization is terminated or revoked.  Performed at [facility], [address] [place], [place] [address]     Coagulation Studies: No results for input(s): LABPROT, INR in the last 72 hours.  Urinalysis: No results for input(s): COLORURINE, LABSPEC, PHURINE, GLUCOSEU, HGBUR, BILIRUBINUR, KETONESUR, PROTEINUR, UROBILINOGEN, NITRITE, LEUKOCYTESUR in the last 72 hours.  Invalid input(s): APPERANCEUR    Imaging: No results found.    Medications:     bisacodyl   10 mg Oral QHS   buPROPion   300 mg  Oral Daily   calcitRIOL   0.25 mcg Oral Daily   Chlorhexidine  Gluconate Cloth  6 each Topical Daily   cholecalciferol   1,000 Units Oral Daily   fludrocortisone   0.2 mg Oral Daily   Followed by   Brady Edwards ON 06/16/2024] fludrocortisone   0.1 mg Oral Daily   Followed by   Brady Edwards ON 06/30/2024] fludrocortisone   0.05 mg Oral Daily   heparin   5,000 Units Subcutaneous Q8H   influenza vac split trivalent PF  0.5 mL Intramuscular Tomorrow-1000   polyethylene glycol  17 g Oral BID   sevelamer  carbonate  1,600 mg Oral TID WC   venlafaxine  XR  150 mg Oral Q breakfast   And   venlafaxine  XR  75 mg Oral Q breakfast   acetaminophen , bisacodyl , butalbital -acetaminophen -caffeine , ondansetron  (ZOFRAN ) IV, ondansetron   Assessment/ Plan:  Brady Edwards is a 58 y.o.  male with a PMHx of hypertension, atrial fibrillation, BPH, hyperlipidemia, anxiety, constipation, morbid obesity, and end stage renal disease on hemodialysis.    [facility]- will need to resubmit for chair at discharge.    End stage renal disease on hemodialysis       Appreciate vascular performing permcath exchange on 9/11 and 10/3.  Received dialysis yesterday. Next treatment scheduled for Saturday.    2.  Anemia of chronic kidney disease. Hgb  11.0, No need for ESA at this time     3.  Secondary hyperparathyroidism. PTH 199 during recent admission. Continue Renvela  1 tabs TID.           Calcium  and phos within optimal range.Continue sevelamer  with meals.   4. Severe orthostatic hypotension Workup so far has included 2D echo from 04/11/2024 which shows LVEF 65 to 70%, normal LV systolic function, mild concentric LVH, normal diastolic parameters, normal right ventricular systolic function.  A.m. cortisol level normal from 04/24/2024.  TSH normal.  Continue TEDs,and abd binders. Midodrine  has been stopped and undergoing Florinef  taper. Blood pressure acceptable, 123/86    LOS: 50 Camber Ninh 10/10/202510:23 AM

## 2024-06-09 DIAGNOSIS — N186 End stage renal disease: Secondary | ICD-10-CM | POA: Diagnosis not present

## 2024-06-09 DIAGNOSIS — I951 Orthostatic hypotension: Secondary | ICD-10-CM | POA: Diagnosis not present

## 2024-06-09 LAB — CBC
HCT: 32.9 % — ABNORMAL LOW (ref 39.0–52.0)
Hemoglobin: 10.7 g/dL — ABNORMAL LOW (ref 13.0–17.0)
MCH: 30.7 pg (ref 26.0–34.0)
MCHC: 32.5 g/dL (ref 30.0–36.0)
MCV: 94.5 fL (ref 80.0–100.0)
Platelets: 282 K/uL (ref 150–400)
RBC: 3.48 MIL/uL — ABNORMAL LOW (ref 4.22–5.81)
RDW: 13.3 % (ref 11.5–15.5)
WBC: 7 K/uL (ref 4.0–10.5)
nRBC: 0 % (ref 0.0–0.2)

## 2024-06-09 LAB — RENAL FUNCTION PANEL
Albumin: 3.5 g/dL (ref 3.5–5.0)
Anion gap: 18 — ABNORMAL HIGH (ref 5–15)
BUN: 17 mg/dL (ref 6–20)
CO2: 22 mmol/L (ref 22–32)
Calcium: 9.3 mg/dL (ref 8.9–10.3)
Chloride: 91 mmol/L — ABNORMAL LOW (ref 98–111)
Creatinine, Ser: 7.77 mg/dL — ABNORMAL HIGH (ref 0.61–1.24)
GFR, Estimated: 7 mL/min — ABNORMAL LOW (ref >60)
Glucose, Bld: 87 mg/dL (ref 70–99)
Phosphorus: 4 mg/dL (ref 2.5–4.6)
Potassium: 3.6 mmol/L (ref 3.5–5.1)
Sodium: 131 mmol/L — ABNORMAL LOW (ref 135–145)

## 2024-06-09 MED ORDER — HEPARIN SODIUM (PORCINE) 1000 UNIT/ML DIALYSIS: 1000.0000 [IU] | INTRAMUSCULAR | Status: DC | PRN

## 2024-06-09 MED ORDER — HEPARIN SODIUM (PORCINE) 1000 UNIT/ML IJ SOLN
INTRAMUSCULAR | Status: AC
  Filled 2024-06-09: qty 3

## 2024-06-09 MED ORDER — HEPARIN SODIUM (PORCINE) 1000 UNIT/ML IJ SOLN
INTRAMUSCULAR | Status: AC
  Filled 2024-06-09: qty 4

## 2024-06-09 MED ORDER — ALTEPLASE 2 MG IJ SOLR: 2.0000 mg | Freq: Once | INTRAMUSCULAR | Status: DC | PRN

## 2024-06-09 MED ORDER — HEPARIN SODIUM (PORCINE) 1000 UNIT/ML DIALYSIS
25.0000 [IU]/kg | INTRAMUSCULAR | Status: DC | PRN
  Administered 2024-06-09: 2700 [IU] via INTRAVENOUS_CENTRAL
  Filled 2024-06-09: qty 3

## 2024-06-09 NOTE — Progress Notes (Signed)
 PROGRESS NOTE Brady Edwards    DOB: Aug 03, 1966, 58 y.o.  FMW:[id number]    Code Status: Full Code   DOA: 04/15/2024   LOS: 51  Brief hospital course  Brady Edwards is a 58 y.o. male with a PMH significant for newly ESRD recently started on HD, atrial fibrillation, anxiety and depression, morbid obesity who presented with complaints of weakness and falls. Of note, pt was hospitalized from 04/07/24 to 04/13/24 during which he was started on dialysis, and was discharged after dialysis on 8/15 to continue outpatient dialysis TTS.  Patient currently receiving routine HD and monitoring with medication adjustments for severe orthostatic hypotension while pending nursing home placement and outpatient HD arrangements.  06/09/24 -unchanged clinical status. Observed standing and transitioning to wheelchair for HD transport. He was a bit unsteady but no falls or LOC  Assessment & Plan  Principal Problem:   Orthostatic hypotension Active Problems:   General weakness   ESRD (end stage renal disease) (HCC)   Anemia of chronic disease   Depression   Clotted dialysis access   Obesity (BMI 30-39.9)   History of atrial fibrillation   Vitamin D  deficiency  Symptomatic Orthostatic hypotension- patient has not been getting midodrine  due to his hypertension at rest. I have discontinued it - continue on florinef  0.3mg  daily with very slow taper with optimism that his symptoms will improve with ongoing physical therapy and adjustments to HD.  - Continue TED hose, abdominal binder, lifestyle changes - continue PT/OT, mobilizing patient as much as possible    General weakness Continue working with physical therapy   ESRD (end stage renal disease) (HCC) TThS Clotted dialysis access Anemia of end-stage renal disease. PermCath exchange by vascular surgery on 9/11 and 10/3.  Hemodialysis as per nephrology   Depression On Wellbutrin  and Effexor    Vitamin D  deficiency Supplement vitamin D    Paroxysmal  atrial fibrillation Not on anticoagulation. Holding Toprol  with low blood pressure.   Currently in sinus.   Obesity (BMI 30-39.9) class II. BMI 37.44  Body mass index is 36.44 kg/m.  VTE ppx: heparin  injection 5,000 Units Start: 04/15/24 2200  Diet:     Diet   Diet regular Room service appropriate? Yes; Fluid consistency: Thin   Consultants: Nephrology  Vascular surgery   Subjective 06/09/24    Pt reports no concerns today. He is stable. Going to HD after we spoke.    Objective  Blood pressure (!) 156/110, pulse 65, temperature 97.9 F (36.6 C), resp. rate 17, height 5' 8 (1.727 m), weight 108.9 kg, SpO2 96%.  Intake/Output Summary (Last 24 hours) at 06/09/2024 0743 Last data filed at 06/08/2024 1410 Gross per 24 hour  Intake 240 ml  Output --  Net 240 ml   Filed Weights   06/05/24 0747 06/05/24 1155 06/07/24 1251  Weight: 109.5 kg 108.9 kg 108.7 kg    Physical Exam:  General: awake, alert, NAD HEENT: atraumatic, clear conjunctiva, anicteric sclera, MMM, hearing grossly normal Respiratory: normal respiratory effort. Cardiovascular: extremities well perfused Nervous: A&O x3. no gross focal neurologic deficits, normal speech Extremities: moves all equally, no edema, normal tone Skin: dry, intact, normal temperature, normal color. No rashes, lesions or ulcers on exposed skin Psychiatry: normal mood, congruent affect  Labs   I have personally reviewed the following labs and imaging studies CBC    Component Value Date/Time   WBC 7.0 06/07/2024 1254   RBC 3.47 (L) 06/07/2024 1254   HGB 11.0 (L) 06/07/2024 1254   HGB 16.3  02/12/2021 1604   HCT 32.5 (L) 06/07/2024 1254   HCT 47.9 02/12/2021 1604   PLT 255 06/07/2024 1254   PLT 252 02/12/2021 1604   MCV 93.7 06/07/2024 1254   MCV 92 02/12/2021 1604   MCH 31.7 06/07/2024 1254   MCHC 33.8 06/07/2024 1254   RDW 13.5 06/07/2024 1254   RDW 13.2 02/12/2021 1604   LYMPHSABS 2.1 06/02/2024 0730   LYMPHSABS 2.7  02/12/2021 1604   MONOABS 0.9 06/02/2024 0730   EOSABS 0.1 06/02/2024 0730   EOSABS 0.3 02/12/2021 1604   BASOSABS 0.1 06/02/2024 0730   BASOSABS 0.1 02/12/2021 1604      Latest Ref Rng & Units 06/07/2024    4:33 AM 06/05/2024    8:20 AM 06/02/2024    7:30 AM  BMP  Glucose 70 - 99 mg/dL 89  97  891   BUN 6 - 20 mg/dL 23  24  21    Creatinine 0.61 - 1.24 mg/dL 1.50  0.16  0.43   Sodium 135 - 145 mmol/L 130  135  136   Potassium 3.5 - 5.1 mmol/L 4.2  3.7  3.8   Chloride 98 - 111 mmol/L 94  96  96   CO2 22 - 32 mmol/L 23  22  23    Calcium  8.9 - 10.3 mg/dL 9.7  9.6  9.4    No results found.  Disposition Plan & Communication  Patient status: Inpatient  Admitted From: Home Planned disposition location: Skilled nursing facility Anticipated discharge date: TBD pending placement   Family Communication: none at bedside     Author: Marien LITTIE Piety, DO [facility] 06/09/2024, 7:43 AM   Available by Epic secure chat 7AM-7PM. If 7PM-7AM, please contact night-coverage.  [facility] contact information found on [email or web address].

## 2024-06-09 NOTE — Progress Notes (Signed)
 [facility]  ROUNDING NOTE   Subjective:  Patient seen and evaluated earlier at bedside and while on dialysis.Patient reports ambulating again yesterday. No acute events overnight.   Hemodialysis dialysis treatment flowsheet  Blood flow rate (mL/min):299 Arterial pressures (mmHg):-197.77 Venous pressures (mmHg):92.52 TMP (mmHg):16.97 Ultrafiltration rate (mL/min):424 Dialysate (mL/min):299   Objective:  Vital signs in last 24 hours:  Temp:  [97.7 F (36.5 C)-98.5 F (36.9 C)] 97.9 F (36.6 C) (10/11 0855) Pulse Rate:  [79-104] 88 (10/11 1030) Resp:  [10-20] 18 (10/11 1030) BP: (107-143)/(90-103) 115/92 (10/11 1030) SpO2:  [97 %-100 %] 97 % (10/11 1030) Weight:  [108.7 kg] 108.7 kg (10/11 0855)  Weight change:  Filed Weights   06/05/24 1155 06/07/24 1251 06/09/24 0855  Weight: 108.9 kg 108.7 kg 108.7 kg    Intake/Output: I/O last 3 completed shifts: In: 580 [P.O.:580] Out: -    Intake/Output this shift:  No intake/output data recorded.  Physical Exam: General: NAD  Head: Normocephalic  Eyes: Anicteric  Neck: Supple  Lungs:  Clear, Room air  Heart: Regular  Abdomen:  Soft  Extremities:  No peripheral edema.  Neurologic: alert  Skin: No lesions  Access: Rt  chest permcath    Basic Metabolic Panel: Recent Labs  Lab 06/05/24 0820 06/07/24 0433 06/09/24 0908  NA 135 130* 131*  K 3.7 4.2 3.6  CL 96* 94* 91*  CO2 22 23 22   GLUCOSE 97 89 87  BUN 24* 23* 17  CREATININE 9.83* 8.49* 7.77*  CALCIUM  9.6 9.7 9.3  PHOS 4.7* 5.0* 4.0    Liver Function Tests: Recent Labs  Lab 06/05/24 0820 06/07/24 0433 06/09/24 0908  ALBUMIN  3.6 4.1 3.5   No results for input(s): LIPASE, AMYLASE in the last 168 hours. No results for input(s): AMMONIA in the last 168 hours.  CBC: Recent Labs  Lab 06/05/24 0820 06/07/24 1254 06/09/24 0908  WBC 6.8 7.0 7.0  HGB 11.2* 11.0* 10.7*  HCT 33.5* 32.5* 32.9*  MCV 94.4 93.7 94.5  PLT 272 255 282     Cardiac Enzymes: No results for input(s): CKTOTAL, CKMB, CKMBINDEX, TROPONINI in the last 168 hours.  BNP: Invalid input(s): POCBNP  CBG: No results for input(s): GLUCAP in the last 168 hours.  Microbiology: Results for orders placed or performed during the hospital encounter of 04/07/24  Resp panel by RT-PCR (RSV, Flu A&B, Covid) Anterior Nasal Swab     Status: None   Collection Time: 04/07/24  4:46 PM   Specimen: Anterior Nasal Swab  Result Value Ref Range Status   SARS Coronavirus 2 by RT PCR NEGATIVE NEGATIVE Final    Comment: (NOTE) SARS-CoV-2 target nucleic acids are NOT DETECTED.  The SARS-CoV-2 RNA is generally detectable in upper respiratory specimens during the acute phase of infection. The lowest concentration of SARS-CoV-2 viral copies this assay can detect is 138 copies/mL. A negative result does not preclude SARS-Cov-2 infection and should not be used as the sole basis for treatment or other patient management decisions. A negative result may occur with  improper specimen collection/handling, submission of specimen other than nasopharyngeal swab, presence of viral mutation(s) within the areas targeted by this assay, and inadequate number of viral copies(<138 copies/mL). A negative result must be combined with clinical observations, patient history, and epidemiological information. The expected result is Negative.  Fact Sheet for Patients:  BloggerCourse.com  Fact Sheet for Healthcare Providers:  SeriousBroker.it  This test is no t yet approved or cleared by the [place]  FDA and  has been authorized for detection and/or diagnosis of SARS-CoV-2 by FDA under an Emergency Use Authorization (EUA). This EUA will remain  in effect (meaning this test can be used) for the duration of the COVID-19 declaration under Section 564(b)(1) of the Act, 21 U.S.C.section 360bbb-3(b)(1), unless the  authorization is terminated  or revoked sooner.       Influenza A by PCR NEGATIVE NEGATIVE Final   Influenza B by PCR NEGATIVE NEGATIVE Final    Comment: (NOTE) The Xpert Xpress SARS-CoV-2/FLU/RSV plus assay is intended as an aid in the diagnosis of influenza from Nasopharyngeal swab specimens and should not be used as a sole basis for treatment. Nasal washings and aspirates are unacceptable for Xpert Xpress SARS-CoV-2/FLU/RSV testing.  Fact Sheet for Patients: BloggerCourse.com  Fact Sheet for Healthcare Providers: SeriousBroker.it  This test is not yet approved or cleared by the [place]  FDA and has been authorized for detection and/or diagnosis of SARS-CoV-2 by FDA under an Emergency Use Authorization (EUA). This EUA will remain in effect (meaning this test can be used) for the duration of the COVID-19 declaration under Section 564(b)(1) of the Act, 21 U.S.C. section 360bbb-3(b)(1), unless the authorization is terminated or revoked.     Resp Syncytial Virus by PCR NEGATIVE NEGATIVE Final    Comment: (NOTE) Fact Sheet for Patients: BloggerCourse.com  Fact Sheet for Healthcare Providers: SeriousBroker.it  This test is not yet approved or cleared by the [place]  FDA and has been authorized for detection and/or diagnosis of SARS-CoV-2 by FDA under an Emergency Use Authorization (EUA). This EUA will remain in effect (meaning this test can be used) for the duration of the COVID-19 declaration under Section 564(b)(1) of the Act, 21 U.S.C. section 360bbb-3(b)(1), unless the authorization is terminated or revoked.  Performed at [facility], [address] [place], [place] [address]     Coagulation Studies: No results for input(s): LABPROT, INR in the last 72 hours.  Urinalysis: No results for input(s): COLORURINE, LABSPEC, PHURINE,  GLUCOSEU, HGBUR, BILIRUBINUR, KETONESUR, PROTEINUR, UROBILINOGEN, NITRITE, LEUKOCYTESUR in the last 72 hours.  Invalid input(s): APPERANCEUR    Imaging: No results found.   Medications:     bisacodyl   10 mg Oral QHS   buPROPion   300 mg Oral Daily   calcitRIOL   0.25 mcg Oral Daily   Chlorhexidine  Gluconate Cloth  6 each Topical Daily   cholecalciferol   1,000 Units Oral Daily   fludrocortisone   0.2 mg Oral Daily   Followed by   NOREEN ON 06/16/2024] fludrocortisone   0.1 mg Oral Daily   Followed by   NOREEN ON 06/30/2024] fludrocortisone   0.05 mg Oral Daily   heparin   5,000 Units Subcutaneous Q8H   influenza vac split trivalent PF  0.5 mL Intramuscular Tomorrow-1000   polyethylene glycol  17 g Oral BID   sevelamer  carbonate  1,600 mg Oral TID WC   venlafaxine  XR  150 mg Oral Q breakfast   And   venlafaxine  XR  75 mg Oral Q breakfast   acetaminophen , alteplase , bisacodyl , butalbital -acetaminophen -caffeine , heparin , heparin , ondansetron  (ZOFRAN ) IV, ondansetron   Assessment/ Plan:  [name] is a 58 y.o.  male  male with a PMHx of hypertension, atrial fibrillation, BPH, hyperlipidemia, anxiety, constipation, morbid obesity, and end stage renal disease on hemodialysis.    [facility]- will need to resubmit for chair at discharge.    End stage renal disease on hemodialysis       Appreciate vascular performing permcath exchange on 9/11 and  10/3.  Receiving dialysis today.      2.  Anemia of chronic kidney disease. Hgb 10.7 today, will continue to monitor need for ESA.     3.  Secondary hyperparathyroidism. PTH 199 during recent admission. Continue Renvela  1 tabs TID.           Calcium  and phos within optimal range.Continue sevelamer  with meals.   4. Severe orthostatic hypotension Workup so far has included 2D echo from 04/11/2024 which shows LVEF 65 to 70%, normal LV systolic function, mild concentric LVH, normal diastolic parameters,  normal right ventricular systolic function.  A.m. cortisol level normal from 04/24/2024.  TSH normal.  Continue TEDs,and abd binders. Midodrine  has been stopped and undergoing Florinef  taper. Blood pressure acceptable, 123/86       LOS: 51 Brady Edwards 10/11/202510:45 AM

## 2024-06-09 NOTE — Plan of Care (Signed)

## 2024-06-09 NOTE — Progress Notes (Signed)
 Hemodialysis Note:  Received patient in bed to unit. Alert and oriented. Informed consent singed and in chart.  Treatment initiated: 0913 Treatment completed: 1300  Access used: Right internal jugular catheter Access issues: Low arterial pressure, reversed lines and lower down blood flow rate  Patient tolerated well. Transported back to room, alert without acute distress. Report given to patient's RN.  Total UF removed: 0 Medications given: None  Post HD weight: 108.7 kg  [facility]

## 2024-06-10 DIAGNOSIS — I951 Orthostatic hypotension: Secondary | ICD-10-CM | POA: Diagnosis not present

## 2024-06-10 DIAGNOSIS — N186 End stage renal disease: Secondary | ICD-10-CM | POA: Diagnosis not present

## 2024-06-10 NOTE — Plan of Care (Signed)

## 2024-06-10 NOTE — Progress Notes (Signed)
 PROGRESS NOTE Brady Edwards    DOB: May 08, 1966, 58 y.o.  FMW:[id number]    Code Status: Full Code   DOA: 04/15/2024   LOS: 52  Brief hospital course  Brady Edwards is a 58 y.o. male with a PMH significant for newly ESRD recently started on HD, atrial fibrillation, anxiety and depression, morbid obesity who presented with complaints of weakness and falls. Of note, pt was hospitalized from 04/07/24 to 04/13/24 during which he was started on dialysis, and was discharged after dialysis on 8/15 to continue outpatient dialysis TTS.  Patient currently receiving routine HD and monitoring with medication adjustments for severe orthostatic hypotension while pending nursing home placement and outpatient HD arrangements.  06/10/24 -unchanged clinical status.  Assessment & Plan  Principal Problem:   Orthostatic hypotension Active Problems:   General weakness   ESRD (end stage renal disease) (HCC)   Anemia of chronic disease   Depression   Clotted dialysis access   Obesity (BMI 30-39.9)   History of atrial fibrillation   Vitamin D  deficiency  Symptomatic Orthostatic hypotension- patient has not been getting midodrine  due to his hypertension at rest. I have discontinued it - continue on florinef  0.3mg  daily with very slow taper with optimism that his symptoms will improve with ongoing physical therapy and adjustments to HD.  - Continue TED hose, abdominal binder, lifestyle changes - continue PT/OT, mobilizing patient as much as possible    General weakness Continue working with physical therapy   ESRD (end stage renal disease) (HCC) TThS Clotted dialysis access Anemia of end-stage renal disease. PermCath exchange by vascular surgery on 9/11 and 10/3.  Hemodialysis as per nephrology   Depression On Wellbutrin  and Effexor    Vitamin D  deficiency Supplement vitamin D    Paroxysmal atrial fibrillation Not on anticoagulation. Holding Toprol  with low blood pressure.   Currently in sinus.    Obesity (BMI 30-39.9) class II. BMI 37.44  Body mass index is 36.44 kg/m.  VTE ppx: heparin  injection 5,000 Units Start: 04/15/24 2200  Diet:     Diet   Diet regular Room service appropriate? Yes; Fluid consistency: Thin   Consultants: Nephrology  Vascular surgery   Subjective 06/10/24    Pt reports no concerns today. He is stable. Expresses frustrations about not having bigger improvements.    Objective  Blood pressure (!) 156/110, pulse 65, temperature 97.9 F (36.6 C), resp. rate 17, height 5' 8 (1.727 m), weight 108.9 kg, SpO2 96%.  Intake/Output Summary (Last 24 hours) at 06/10/2024 0802 Last data filed at 06/09/2024 2012 Gross per 24 hour  Intake 60 ml  Output 0 ml  Net 60 ml   Filed Weights   06/07/24 1251 06/09/24 0855 06/09/24 1300  Weight: 108.7 kg 108.7 kg 108.7 kg    Physical Exam:  General: awake, alert, NAD HEENT: atraumatic, clear conjunctiva, anicteric sclera, MMM, hearing grossly normal Respiratory: normal respiratory effort. Cardiovascular: extremities well perfused Nervous: A&O x3. no gross focal neurologic deficits, normal speech Extremities: moves all equally, no edema, normal tone Skin: dry, intact, normal temperature, normal color. No rashes, lesions or ulcers on exposed skin Psychiatry: normal mood, congruent affect  Labs   I have personally reviewed the following labs and imaging studies CBC    Component Value Date/Time   WBC 7.0 06/09/2024 0908   RBC 3.48 (L) 06/09/2024 0908   HGB 10.7 (L) 06/09/2024 0908   HGB 16.3 02/12/2021 1604   HCT 32.9 (L) 06/09/2024 0908   HCT 47.9 02/12/2021 1604  PLT 282 06/09/2024 0908   PLT 252 02/12/2021 1604   MCV 94.5 06/09/2024 0908   MCV 92 02/12/2021 1604   MCH 30.7 06/09/2024 0908   MCHC 32.5 06/09/2024 0908   RDW 13.3 06/09/2024 0908   RDW 13.2 02/12/2021 1604   LYMPHSABS 2.1 06/02/2024 0730   LYMPHSABS 2.7 02/12/2021 1604   MONOABS 0.9 06/02/2024 0730   EOSABS 0.1 06/02/2024 0730    EOSABS 0.3 02/12/2021 1604   BASOSABS 0.1 06/02/2024 0730   BASOSABS 0.1 02/12/2021 1604      Latest Ref Rng & Units 06/09/2024    9:08 AM 06/07/2024    4:33 AM 06/05/2024    8:20 AM  BMP  Glucose 70 - 99 mg/dL 87  89  97   BUN 6 - 20 mg/dL 17  23  24    Creatinine 0.61 - 1.24 mg/dL 2.22  1.50  0.16   Sodium 135 - 145 mmol/L 131  130  135   Potassium 3.5 - 5.1 mmol/L 3.6  4.2  3.7   Chloride 98 - 111 mmol/L 91  94  96   CO2 22 - 32 mmol/L 22  23  22    Calcium  8.9 - 10.3 mg/dL 9.3  9.7  9.6    No results found.  Disposition Plan & Communication  Patient status: Inpatient  Admitted From: Home Planned disposition location: Skilled nursing facility Anticipated discharge date: TBD pending placement   Family Communication: none at bedside     Author: Marien LITTIE Piety, DO [facility] 06/10/2024, 8:02 AM   Available by Epic secure chat 7AM-7PM. If 7PM-7AM, please contact night-coverage.  [facility] contact information found on [email or web address].

## 2024-06-10 NOTE — Plan of Care (Signed)
  Problem: Activity: Goal: Risk for activity intolerance will decrease Outcome: Progressing   Problem: Education: Goal: Ability to describe self-care measures that may prevent or decrease complications (Diabetes Survival Skills Education) will improve Outcome: Progressing   Problem: Health Behavior/Discharge Planning: Goal: Ability to manage health-related needs will improve Outcome: Progressing

## 2024-06-10 NOTE — Plan of Care (Signed)

## 2024-06-10 NOTE — Progress Notes (Signed)
 [facility]  ROUNDING NOTE   Subjective:  Patient seen and evaluated at bedside.  Patient reports no issues with dialysis yesterday.  No complaints of offer. Encouraged PO intake. Patient disliking the limited menu choices.   Objective:  Vital signs in last 24 hours:  Temp:  [97.7 F (36.5 C)-98.3 F (36.8 C)] 97.7 F (36.5 C) (10/12 0804) Pulse Rate:  [82-109] 109 (10/12 0804) Resp:  [13-20] 18 (10/12 0804) BP: (111-138)/(83-96) 121/83 (10/12 0804) SpO2:  [97 %-100 %] 100 % (10/12 0804) Weight:  [108.7 kg] 108.7 kg (10/11 1300)  Weight change:  Filed Weights   06/07/24 1251 06/09/24 0855 06/09/24 1300  Weight: 108.7 kg 108.7 kg 108.7 kg    Intake/Output: I/O last 3 completed shifts: In: 60 [P.O.:60] Out: 0    Intake/Output this shift:  No intake/output data recorded.  Physical Exam: General: NAD  Head: Normocephalic  Eyes: Anicteric  Neck: Supple  Lungs:  Clear  Heart: Regular rate and rhythm  Abdomen:  Soft, nontender, bowel sounds heard  Extremities:  no peripheral edema.  Neurologic: Alert and awake  Skin: Warm, in tact  Access: Right Permcath    Basic Metabolic Panel: Recent Labs  Lab 06/05/24 0820 06/07/24 0433 06/09/24 0908  NA 135 130* 131*  K 3.7 4.2 3.6  CL 96* 94* 91*  CO2 22 23 22   GLUCOSE 97 89 87  BUN 24* 23* 17  CREATININE 9.83* 8.49* 7.77*  CALCIUM  9.6 9.7 9.3  PHOS 4.7* 5.0* 4.0    Liver Function Tests: Recent Labs  Lab 06/05/24 0820 06/07/24 0433 06/09/24 0908  ALBUMIN  3.6 4.1 3.5   No results for input(s): LIPASE, AMYLASE in the last 168 hours. No results for input(s): AMMONIA in the last 168 hours.  CBC: Recent Labs  Lab 06/05/24 0820 06/07/24 1254 06/09/24 0908  WBC 6.8 7.0 7.0  HGB 11.2* 11.0* 10.7*  HCT 33.5* 32.5* 32.9*  MCV 94.4 93.7 94.5  PLT 272 255 282    Cardiac Enzymes: No results for input(s): CKTOTAL, CKMB, CKMBINDEX, TROPONINI in the last 168 hours.  BNP: Invalid  input(s): POCBNP  CBG: No results for input(s): GLUCAP in the last 168 hours.  Microbiology: Results for orders placed or performed during the hospital encounter of 04/07/24  Resp panel by RT-PCR (RSV, Flu A&B, Covid) Anterior Nasal Swab     Status: None   Collection Time: 04/07/24  4:46 PM   Specimen: Anterior Nasal Swab  Result Value Ref Range Status   SARS Coronavirus 2 by RT PCR NEGATIVE NEGATIVE Final    Comment: (NOTE) SARS-CoV-2 target nucleic acids are NOT DETECTED.  The SARS-CoV-2 RNA is generally detectable in upper respiratory specimens during the acute phase of infection. The lowest concentration of SARS-CoV-2 viral copies this assay can detect is 138 copies/mL. A negative result does not preclude SARS-Cov-2 infection and should not be used as the sole basis for treatment or other patient management decisions. A negative result may occur with  improper specimen collection/handling, submission of specimen other than nasopharyngeal swab, presence of viral mutation(s) within the areas targeted by this assay, and inadequate number of viral copies(<138 copies/mL). A negative result must be combined with clinical observations, patient history, and epidemiological information. The expected result is Negative.  Fact Sheet for Patients:  BloggerCourse.com  Fact Sheet for Healthcare Providers:  SeriousBroker.it  This test is no t yet approved or cleared by the [place]  FDA and  has been authorized for detection and/or diagnosis of  SARS-CoV-2 by FDA under an Emergency Use Authorization (EUA). This EUA will remain  in effect (meaning this test can be used) for the duration of the COVID-19 declaration under Section 564(b)(1) of the Act, 21 U.S.C.section 360bbb-3(b)(1), unless the authorization is terminated  or revoked sooner.       Influenza A by PCR NEGATIVE NEGATIVE Final   Influenza B by PCR NEGATIVE NEGATIVE  Final    Comment: (NOTE) The Xpert Xpress SARS-CoV-2/FLU/RSV plus assay is intended as an aid in the diagnosis of influenza from Nasopharyngeal swab specimens and should not be used as a sole basis for treatment. Nasal washings and aspirates are unacceptable for Xpert Xpress SARS-CoV-2/FLU/RSV testing.  Fact Sheet for Patients: BloggerCourse.com  Fact Sheet for Healthcare Providers: SeriousBroker.it  This test is not yet approved or cleared by the [place]  FDA and has been authorized for detection and/or diagnosis of SARS-CoV-2 by FDA under an Emergency Use Authorization (EUA). This EUA will remain in effect (meaning this test can be used) for the duration of the COVID-19 declaration under Section 564(b)(1) of the Act, 21 U.S.C. section 360bbb-3(b)(1), unless the authorization is terminated or revoked.     Resp Syncytial Virus by PCR NEGATIVE NEGATIVE Final    Comment: (NOTE) Fact Sheet for Patients: BloggerCourse.com  Fact Sheet for Healthcare Providers: SeriousBroker.it  This test is not yet approved or cleared by the [place]  FDA and has been authorized for detection and/or diagnosis of SARS-CoV-2 by FDA under an Emergency Use Authorization (EUA). This EUA will remain in effect (meaning this test can be used) for the duration of the COVID-19 declaration under Section 564(b)(1) of the Act, 21 U.S.C. section 360bbb-3(b)(1), unless the authorization is terminated or revoked.  Performed at [facility], [address] [place], [place] [address]     Coagulation Studies: No results for input(s): LABPROT, INR in the last 72 hours.  Urinalysis: No results for input(s): COLORURINE, LABSPEC, PHURINE, GLUCOSEU, HGBUR, BILIRUBINUR, KETONESUR, PROTEINUR, UROBILINOGEN, NITRITE, LEUKOCYTESUR in the last 72 hours.  Invalid input(s):  APPERANCEUR    Imaging: No results found.   Medications:     bisacodyl   10 mg Oral QHS   buPROPion   300 mg Oral Daily   calcitRIOL   0.25 mcg Oral Daily   Chlorhexidine  Gluconate Cloth  6 each Topical Daily   cholecalciferol   1,000 Units Oral Daily   fludrocortisone   0.2 mg Oral Daily   Followed by   NOREEN ON 06/16/2024] fludrocortisone   0.1 mg Oral Daily   Followed by   NOREEN ON 06/30/2024] fludrocortisone   0.05 mg Oral Daily   heparin   5,000 Units Subcutaneous Q8H   influenza vac split trivalent PF  0.5 mL Intramuscular Tomorrow-1000   polyethylene glycol  17 g Oral BID   sevelamer  carbonate  1,600 mg Oral TID WC   venlafaxine  XR  150 mg Oral Q breakfast   And   venlafaxine  XR  75 mg Oral Q breakfast   acetaminophen , bisacodyl , butalbital -acetaminophen -caffeine , ondansetron  (ZOFRAN ) IV, ondansetron   Assessment/ Plan:  Brady Edwards is a 58 y.o.  male  with a PMHx of hypertension, atrial fibrillation, BPH, hyperlipidemia, anxiety, constipation, morbid obesity, and end stage renal disease on hemodialysis.    [facility]- will need to resubmit for chair at discharge.    End stage renal disease on hemodialysis       Appreciate vascular performing permcath exchange on 9/11 and 10/3.  Received dialysis yesterday. Noted high pressures on access flows, unable to  reach prescribed rate. Will monitor for need of CVC exchange prior to discharge.    2.  Anemia of chronic kidney disease. Hgb 10.7 today, no need for ESA at this time. Monitoring     3.  Secondary hyperparathyroidism. PTH 199 during recent admission. Continue Renvela  1 tabs TID.           Calcium  and phos within optimal range.Will continue sevelamer  with meals.   4. Severe orthostatic hypotension Workup so far has included 2D echo from 04/11/2024 which shows LVEF 65 to 70%, normal LV systolic function, mild concentric LVH, normal diastolic parameters, normal right ventricular systolic function.  A.m.  cortisol level normal from 04/24/2024.  TSH normal.  Continue TEDs,and abd binders. Midodrine  has been stopped and undergoing Florinef  taper. Blood pressure 121/83 today. Patient has been trying to ambulate as tolerated.        LOS: 52 Aleza Pew P Heavan Francom 10/12/202510:09 AM

## 2024-06-11 DIAGNOSIS — I951 Orthostatic hypotension: Secondary | ICD-10-CM | POA: Diagnosis not present

## 2024-06-11 DIAGNOSIS — N186 End stage renal disease: Secondary | ICD-10-CM | POA: Diagnosis not present

## 2024-06-11 NOTE — Progress Notes (Signed)
 PROGRESS NOTE Brady Edwards    DOB: 12-Mar-1966, 58 y.o.  FMW:[id number]    Code Status: Full Code   DOA: 04/15/2024   LOS: 53  Brief hospital course  [name] is a 58 y.o. male with a PMH significant for newly ESRD recently started on HD, atrial fibrillation, anxiety and depression, morbid obesity who presented with complaints of weakness and falls. Of note, pt was hospitalized from 04/07/24 to 04/13/24 during which he was started on dialysis, and was discharged after dialysis on 8/15 to continue outpatient dialysis TTS.  Patient currently receiving routine HD and monitoring with medication adjustments for severe orthostatic hypotension while pending nursing home placement and outpatient HD arrangements.  06/11/24 -unchanged clinical status.  Assessment & Plan  Principal Problem:   Orthostatic hypotension Active Problems:   General weakness   ESRD (end stage renal disease) (HCC)   Anemia of chronic disease   Depression   Clotted dialysis access   Obesity (BMI 30-39.9)   History of atrial fibrillation   Vitamin D  deficiency  Symptomatic Orthostatic hypotension- patient has not been getting midodrine  due to his hypertension at rest. I have discontinued it - continue on florinef  0.3mg  daily with very slow taper with optimism that his symptoms will improve with ongoing physical therapy and adjustments to HD.  - Continue TED hose, abdominal binder, lifestyle changes - continue PT/OT, mobilizing patient as much as possible    General weakness Continue working with physical therapy   ESRD (end stage renal disease) (HCC) TThS Clotted dialysis access Anemia of end-stage renal disease. PermCath exchange by vascular surgery on 9/11 and 10/3.  Hemodialysis as per nephrology   Depression On Wellbutrin  and Effexor    Vitamin D  deficiency Supplement vitamin D    Paroxysmal atrial fibrillation Not on anticoagulation. Holding Toprol  with low blood pressure.   Currently in sinus.    Obesity (BMI 30-39.9) class II. BMI 37.44  Body mass index is 36.44 kg/m.  VTE ppx: heparin  injection 5,000 Units Start: 04/15/24 2200  Diet:     Diet   Diet regular Room service appropriate? Yes; Fluid consistency: Thin   Consultants: Nephrology  Vascular surgery   Subjective 06/11/24    Pt reports no concerns today. He is stable. Seen ambulating with therapy in hall.   Objective  Blood pressure (!) 156/110, pulse 65, temperature 97.9 F (36.6 C), resp. rate 17, height 5' 8 (1.727 m), weight 108.9 kg, SpO2 96%.  Intake/Output Summary (Last 24 hours) at 06/11/2024 0738 Last data filed at 06/10/2024 1700 Gross per 24 hour  Intake 240 ml  Output --  Net 240 ml   Filed Weights   06/07/24 1251 06/09/24 0855 06/09/24 1300  Weight: 108.7 kg 108.7 kg 108.7 kg    Physical Exam:  General: awake, alert, NAD HEENT: atraumatic, clear conjunctiva, anicteric sclera, MMM, hearing grossly normal Respiratory: normal respiratory effort. Cardiovascular: extremities well perfused Nervous: A&O x3. no gross focal neurologic deficits, normal speech Extremities: moves all equally, no edema, normal tone Skin: dry, intact, normal temperature, normal color. No rashes, lesions or ulcers on exposed skin Psychiatry: normal mood, congruent affect  Labs   I have personally reviewed the following labs and imaging studies CBC    Component Value Date/Time   WBC 7.0 06/09/2024 0908   RBC 3.48 (L) 06/09/2024 0908   HGB 10.7 (L) 06/09/2024 0908   HGB 16.3 02/12/2021 1604   HCT 32.9 (L) 06/09/2024 0908   HCT 47.9 02/12/2021 1604   PLT 282 06/09/2024  0908   PLT 252 02/12/2021 1604   MCV 94.5 06/09/2024 0908   MCV 92 02/12/2021 1604   MCH 30.7 06/09/2024 0908   MCHC 32.5 06/09/2024 0908   RDW 13.3 06/09/2024 0908   RDW 13.2 02/12/2021 1604   LYMPHSABS 2.1 06/02/2024 0730   LYMPHSABS 2.7 02/12/2021 1604   MONOABS 0.9 06/02/2024 0730   EOSABS 0.1 06/02/2024 0730   EOSABS 0.3 02/12/2021  1604   BASOSABS 0.1 06/02/2024 0730   BASOSABS 0.1 02/12/2021 1604      Latest Ref Rng & Units 06/09/2024    9:08 AM 06/07/2024    4:33 AM 06/05/2024    8:20 AM  BMP  Glucose 70 - 99 mg/dL 87  89  97   BUN 6 - 20 mg/dL 17  23  24    Creatinine 0.61 - 1.24 mg/dL 2.22  1.50  0.16   Sodium 135 - 145 mmol/L 131  130  135   Potassium 3.5 - 5.1 mmol/L 3.6  4.2  3.7   Chloride 98 - 111 mmol/L 91  94  96   CO2 22 - 32 mmol/L 22  23  22    Calcium  8.9 - 10.3 mg/dL 9.3  9.7  9.6    No results found.  Disposition Plan & Communication  Patient status: Inpatient  Admitted From: Home Planned disposition location: Skilled nursing facility Anticipated discharge date: TBD pending placement   Family Communication: none at bedside     Author: Marien LITTIE Piety, DO [facility] 06/11/2024, 7:38 AM   Available by Epic secure chat 7AM-7PM. If 7PM-7AM, please contact night-coverage.  [facility] contact information found on [email or web address].

## 2024-06-11 NOTE — TOC Progression Note (Signed)
 Transition of Care [facility]) - Progression Note    Patient Details  Name: Brady Edwards MRN: [id number] Date of Birth: February 24, 1966  [facility] CM/SW Contact  Marinda Cooks, RN Phone Number: 06/11/2024, 3:06 PM  Clinical Narrative:     This CM rec'd message from [place]  the  admission liaison at [facility]  Informing  facility is still confirming if they can extend bed offer. The pending issues that are being confirmed include pt's  dialysis chair ,dialysis financial clearance, and clearance from SNF administrator  . TOC will cont to follow dc planning / care coordination and update as applicable.                      Expected Discharge Plan and Services                                               Social Drivers of Health (SDOH) Interventions SDOH Screenings   Food Insecurity: Food Insecurity Present (04/16/2024)  Housing: High Risk (04/16/2024)  Transportation Needs: Unmet Transportation Needs (04/16/2024)  Utilities: At Risk (04/16/2024)  Depression (PHQ2-9): High Risk (02/27/2024)  Tobacco Use: High Risk (05/19/2024)    Readmission Risk Interventions     No data to display

## 2024-06-11 NOTE — Progress Notes (Signed)
 Mobility Specialist Progress Note:    06/11/24 1648  Orthostatic Lying   BP- Lying (!) 128/94  Pulse- Lying 85  Orthostatic Sitting  BP- Sitting (!) 86/69  Pulse- Sitting 100  Orthostatic Standing at 0 minutes  BP- Standing at 0 minutes (!) 84/42  Pulse- Standing at 0 minutes 115  Mobility  Activity Ambulated with assistance  Level of Assistance Modified independent, requires aide device or extra time  Assistive Device None  Distance Ambulated (ft) 400 ft  Range of Motion/Exercises Active;All extremities  Activity Response Tolerated well  Mobility visit 1 Mobility  Mobility Specialist Start Time (ACUTE ONLY) 1646  Mobility Specialist Stop Time (ACUTE ONLY) 1704  Mobility Specialist Time Calculation (min) (ACUTE ONLY) 18 min   Ted hose, ace wraps, and abdominal binder placed prior to mobility. Returned seated for recovery after orthostatic vitals. After ambulation, BP 128/88 and HR 108 bpm. Returned pt supine,all needs met.  Sherrilee Ditty Mobility Specialist Please contact via Special educational needs teacher or  Rehab office at [phone]

## 2024-06-11 NOTE — Progress Notes (Signed)
 Mobility Specialist Progress Note:    06/11/24 0921  Orthostatic Lying   BP- Lying (!) 139/96  Pulse- Lying 91  Orthostatic Sitting  BP- Sitting 104/81  Pulse- Sitting 115  Orthostatic Standing at 0 minutes  BP- Standing at 0 minutes (!) 79/65  Pulse- Standing at 0 minutes 116  Mobility  Activity Ambulated with assistance  Level of Assistance Modified independent, requires aide device or extra time  Assistive Device None  Distance Ambulated (ft) 500 ft  Range of Motion/Exercises Active;All extremities  Activity Response Tolerated well  Mobility visit 1 Mobility  Mobility Specialist Start Time (ACUTE ONLY) 0919  Mobility Specialist Stop Time (ACUTE ONLY) [id number]  Mobility Specialist Time Calculation (min) (ACUTE ONLY) 19 min   Ted hose, ace wraps, and abdominal binder placed prior to mobility. After standing BP in orthostatics, pt returned seated for recovery. Upon standing after break, BP 96/72 and HR 108 bpm. Pt experiencing mild symptoms this date and denies dizziness. After ambulation, BP 112/91 and HR 113 bpm. Left pt sitting EOB, all needs met.  Sherrilee Ditty Mobility Specialist Please contact via Special educational needs teacher or  Rehab office at [phone]

## 2024-06-12 DIAGNOSIS — I951 Orthostatic hypotension: Secondary | ICD-10-CM | POA: Diagnosis not present

## 2024-06-12 DIAGNOSIS — N186 End stage renal disease: Secondary | ICD-10-CM | POA: Diagnosis not present

## 2024-06-12 LAB — RENAL FUNCTION PANEL
Albumin: 3.9 g/dL (ref 3.5–5.0)
Anion gap: 17 — ABNORMAL HIGH (ref 5–15)
BUN: 21 mg/dL — ABNORMAL HIGH (ref 6–20)
CO2: 23 mmol/L (ref 22–32)
Calcium: 9.4 mg/dL (ref 8.9–10.3)
Chloride: 91 mmol/L — ABNORMAL LOW (ref 98–111)
Creatinine, Ser: 8.62 mg/dL — ABNORMAL HIGH (ref 0.61–1.24)
GFR, Estimated: 7 mL/min — ABNORMAL LOW (ref >60)
Glucose, Bld: 92 mg/dL (ref 70–99)
Phosphorus: 4.1 mg/dL (ref 2.5–4.6)
Potassium: 3.6 mmol/L (ref 3.5–5.1)
Sodium: 131 mmol/L — ABNORMAL LOW (ref 135–145)

## 2024-06-12 LAB — CBC
HCT: 33 % — ABNORMAL LOW (ref 39.0–52.0)
Hemoglobin: 11.2 g/dL — ABNORMAL LOW (ref 13.0–17.0)
MCH: 31.4 pg (ref 26.0–34.0)
MCHC: 33.9 g/dL (ref 30.0–36.0)
MCV: 92.4 fL (ref 80.0–100.0)
Platelets: 263 K/uL (ref 150–400)
RBC: 3.57 MIL/uL — ABNORMAL LOW (ref 4.22–5.81)
RDW: 13.2 % (ref 11.5–15.5)
WBC: 6.6 K/uL (ref 4.0–10.5)
nRBC: 0 % (ref 0.0–0.2)

## 2024-06-12 MED ORDER — HEPARIN SODIUM (PORCINE) 1000 UNIT/ML IJ SOLN
INTRAMUSCULAR | Status: AC
  Filled 2024-06-12: qty 2

## 2024-06-12 MED ORDER — ALTEPLASE 2 MG IJ SOLR: 2.0000 mg | Freq: Once | INTRAMUSCULAR | Status: DC | PRN

## 2024-06-12 MED ORDER — HEPARIN SODIUM (PORCINE) 1000 UNIT/ML IJ SOLN
INTRAMUSCULAR | Status: AC
  Filled 2024-06-12: qty 1

## 2024-06-12 MED ORDER — STERILE WATER FOR INJECTION IJ SOLN
INTRAMUSCULAR | Status: AC
  Filled 2024-06-12: qty 10

## 2024-06-12 MED ORDER — HEPARIN SODIUM (PORCINE) 1000 UNIT/ML DIALYSIS
25.0000 [IU]/kg | INTRAMUSCULAR | Status: DC | PRN
  Administered 2024-06-12: 2700 [IU] via INTRAVENOUS_CENTRAL

## 2024-06-12 MED ORDER — ALTEPLASE 2 MG IJ SOLR
INTRAMUSCULAR | Status: AC
  Filled 2024-06-12: qty 4

## 2024-06-12 MED ORDER — HEPARIN SODIUM (PORCINE) 1000 UNIT/ML DIALYSIS: 1000.0000 [IU] | INTRAMUSCULAR | Status: DC | PRN

## 2024-06-12 MED ORDER — HEPARIN SODIUM (PORCINE) 1000 UNIT/ML IJ SOLN
INTRAMUSCULAR | Status: AC
  Filled 2024-06-12: qty 3

## 2024-06-12 NOTE — Progress Notes (Signed)
 [facility]  ROUNDING NOTE   Subjective:   Patient seen and evaluated during dialysis   HEMODIALYSIS FLOWSHEET:  Blood Flow Rate (mL/min): 300 mL/min Arterial Pressure (mmHg): -181.61 mmHg Venous Pressure (mmHg): 88.88 mmHg TMP (mmHg): 14.34 mmHg Ultrafiltration Rate (mL/min): 601 mL/min Dialysate Flow Rate (mL/min): 299 ml/min Dialysis Fluid Bolus: Normal Saline Bolus Amount (mL): 200 mL  Tolerating treatment well.   Objective:  Vital signs in last 24 hours:  Temp:  [97.7 F (36.5 C)-98.6 F (37 C)] 98.1 F (36.7 C) (10/14 0844) Pulse Rate:  [88-110] 90 (10/14 1000) Resp:  [14-20] 14 (10/14 1000) BP: (108-137)/(79-100) 108/96 (10/14 1000) SpO2:  [94 %-100 %] 94 % (10/14 1000) Weight:  [107.6 kg] 107.6 kg (10/14 0844)  Weight change:  Filed Weights   06/09/24 0855 06/09/24 1300 06/12/24 0844  Weight: 108.7 kg 108.7 kg 107.6 kg    Intake/Output: No intake/output data recorded.   Intake/Output this shift:  No intake/output data recorded.  Physical Exam: General: NAD  Head: Normocephalic  Eyes: Anicteric  Lungs:  Clear  Heart: Regular rate and rhythm  Abdomen:  Soft, nontender, bowel sounds heard  Extremities:  no peripheral edema.  Neurologic: Alert and awake  Skin: Warm, intact  Access: Right Permcath    Basic Metabolic Panel: Recent Labs  Lab 06/07/24 0433 06/09/24 0908 06/12/24 0900  NA 130* 131* 131*  K 4.2 3.6 3.6  CL 94* 91* 91*  CO2 23 22 23   GLUCOSE 89 87 92  BUN 23* 17 21*  CREATININE 8.49* 7.77* 8.62*  CALCIUM  9.7 9.3 9.4  PHOS 5.0* 4.0 4.1    Liver Function Tests: Recent Labs  Lab 06/07/24 0433 06/09/24 0908 06/12/24 0900  ALBUMIN  4.1 3.5 3.9   No results for input(s): LIPASE, AMYLASE in the last 168 hours. No results for input(s): AMMONIA in the last 168 hours.  CBC: Recent Labs  Lab 06/07/24 1254 06/09/24 0908 06/12/24 0900  WBC 7.0 7.0 6.6  HGB 11.0* 10.7* 11.2*  HCT 32.5* 32.9* 33.0*  MCV  93.7 94.5 92.4  PLT 255 282 263    Cardiac Enzymes: No results for input(s): CKTOTAL, CKMB, CKMBINDEX, TROPONINI in the last 168 hours.  BNP: Invalid input(s): POCBNP  CBG: No results for input(s): GLUCAP in the last 168 hours.  Microbiology: Results for orders placed or performed during the hospital encounter of 04/07/24  Resp panel by RT-PCR (RSV, Flu A&B, Covid) Anterior Nasal Swab     Status: None   Collection Time: 04/07/24  4:46 PM   Specimen: Anterior Nasal Swab  Result Value Ref Range Status   SARS Coronavirus 2 by RT PCR NEGATIVE NEGATIVE Final    Comment: (NOTE) SARS-CoV-2 target nucleic acids are NOT DETECTED.  The SARS-CoV-2 RNA is generally detectable in upper respiratory specimens during the acute phase of infection. The lowest concentration of SARS-CoV-2 viral copies this assay can detect is 138 copies/mL. A negative result does not preclude SARS-Cov-2 infection and should not be used as the sole basis for treatment or other patient management decisions. A negative result may occur with  improper specimen collection/handling, submission of specimen other than nasopharyngeal swab, presence of viral mutation(s) within the areas targeted by this assay, and inadequate number of viral copies(<138 copies/mL). A negative result must be combined with clinical observations, patient history, and epidemiological information. The expected result is Negative.  Fact Sheet for Patients:  BloggerCourse.com  Fact Sheet for Healthcare Providers:  SeriousBroker.it  This test is no t yet  approved or cleared by the [place]  FDA and  has been authorized for detection and/or diagnosis of SARS-CoV-2 by FDA under an Emergency Use Authorization (EUA). This EUA will remain  in effect (meaning this test can be used) for the duration of the COVID-19 declaration under Section 564(b)(1) of the Act, 21 U.S.C.section  360bbb-3(b)(1), unless the authorization is terminated  or revoked sooner.       Influenza A by PCR NEGATIVE NEGATIVE Final   Influenza B by PCR NEGATIVE NEGATIVE Final    Comment: (NOTE) The Xpert Xpress SARS-CoV-2/FLU/RSV plus assay is intended as an aid in the diagnosis of influenza from Nasopharyngeal swab specimens and should not be used as a sole basis for treatment. Nasal washings and aspirates are unacceptable for Xpert Xpress SARS-CoV-2/FLU/RSV testing.  Fact Sheet for Patients: BloggerCourse.com  Fact Sheet for Healthcare Providers: SeriousBroker.it  This test is not yet approved or cleared by the [place]  FDA and has been authorized for detection and/or diagnosis of SARS-CoV-2 by FDA under an Emergency Use Authorization (EUA). This EUA will remain in effect (meaning this test can be used) for the duration of the COVID-19 declaration under Section 564(b)(1) of the Act, 21 U.S.C. section 360bbb-3(b)(1), unless the authorization is terminated or revoked.     Resp Syncytial Virus by PCR NEGATIVE NEGATIVE Final    Comment: (NOTE) Fact Sheet for Patients: BloggerCourse.com  Fact Sheet for Healthcare Providers: SeriousBroker.it  This test is not yet approved or cleared by the [place]  FDA and has been authorized for detection and/or diagnosis of SARS-CoV-2 by FDA under an Emergency Use Authorization (EUA). This EUA will remain in effect (meaning this test can be used) for the duration of the COVID-19 declaration under Section 564(b)(1) of the Act, 21 U.S.C. section 360bbb-3(b)(1), unless the authorization is terminated or revoked.  Performed at [facility], [address] [place], [place] [address]     Coagulation Studies: No results for input(s): LABPROT, INR in the last 72 hours.  Urinalysis: No results for input(s): COLORURINE,  LABSPEC, PHURINE, GLUCOSEU, HGBUR, BILIRUBINUR, KETONESUR, PROTEINUR, UROBILINOGEN, NITRITE, LEUKOCYTESUR in the last 72 hours.  Invalid input(s): APPERANCEUR    Imaging: No results found.   Medications:     bisacodyl   10 mg Oral QHS   buPROPion   300 mg Oral Daily   calcitRIOL   0.25 mcg Oral Daily   Chlorhexidine  Gluconate Cloth  6 each Topical Daily   cholecalciferol   1,000 Units Oral Daily   fludrocortisone   0.2 mg Oral Daily   Followed by   NOREEN ON 06/16/2024] fludrocortisone   0.1 mg Oral Daily   Followed by   NOREEN ON 06/30/2024] fludrocortisone   0.05 mg Oral Daily   heparin   5,000 Units Subcutaneous Q8H   influenza vac split trivalent PF  0.5 mL Intramuscular Tomorrow-1000   polyethylene glycol  17 g Oral BID   sevelamer  carbonate  1,600 mg Oral TID WC   venlafaxine  XR  150 mg Oral Q breakfast   And   venlafaxine  XR  75 mg Oral Q breakfast   acetaminophen , alteplase , bisacodyl , butalbital -acetaminophen -caffeine , heparin , heparin , ondansetron  (ZOFRAN ) IV, ondansetron   Assessment/ Plan:  [name] is a 58 y.o.  male  with a PMHx of hypertension, atrial fibrillation, BPH, hyperlipidemia, anxiety, constipation, morbid obesity, and end stage renal disease on hemodialysis.    [facility]- will need to resubmit for chair at discharge.    End stage renal disease on hemodialysis  Appreciate vascular performing permcath exchange on 9/11 and 10/3.  Receiving dialysis today, access functioning fair.  Patient does require heparinized circuit.  Next treatment scheduled for Tuesday.  Monitoring discharge plan.    2.  Anemia of chronic kidney disease. Hgb stable, 11.2.      3.  Secondary hyperparathyroidism. PTH 199 during recent admission. Continue Renvela  1 tabs TID.           Calcium  and phosphorus within optimal range for renal patient.  Will continue calcitriol  and sevelamer  with meals.   4. Severe orthostatic hypotension Workup so  far has included 2D echo from 04/11/2024 which shows LVEF 65 to 70%, normal LV systolic function, mild concentric LVH, normal diastolic parameters, normal right ventricular systolic function.  A.m. cortisol level normal from 04/24/2024.  TSH normal.  Continue TEDs,and abd binders. Midodrine  has been stopped and undergoing Florinef  taper. Patient has been able to ambulate as tolerated.        LOS: 54 Rosell Khouri 10/14/202510:50 AM

## 2024-06-12 NOTE — Plan of Care (Signed)

## 2024-06-12 NOTE — Progress Notes (Signed)
 PROGRESS NOTE Brady Edwards    DOB: 08-05-1966, 58 y.o.  FMW:[id number]    Code Status: Full Code   DOA: 04/15/2024   LOS: 54  Brief hospital course  Brady Edwards is a 58 y.o. male with a PMH significant for newly ESRD recently started on HD, atrial fibrillation, anxiety and depression, morbid obesity who presented with complaints of weakness and falls. Of note, pt was hospitalized from 04/07/24 to 04/13/24 during which he was started on dialysis, and was discharged after dialysis on 8/15 to continue outpatient dialysis TTS.  Patient currently receiving routine HD and monitoring with medication adjustments for severe orthostatic hypotension while pending nursing home placement and outpatient HD arrangements.  06/12/24 -unchanged clinical status.  Assessment & Plan  Principal Problem:   Orthostatic hypotension Active Problems:   General weakness   ESRD (end stage renal disease) (HCC)   Anemia of chronic disease   Depression   Clotted dialysis access   Obesity (BMI 30-39.9)   History of atrial fibrillation   Vitamin D  deficiency  Symptomatic Orthostatic hypotension- patient has not been getting midodrine  due to his hypertension at rest. I have discontinued it - continue on florinef  0.3mg  daily with very slow taper with optimism that his symptoms will improve with ongoing physical therapy and adjustments to HD.  - Continue TED hose, abdominal binder, lifestyle changes - continue PT/OT, mobilizing patient as much as possible    General weakness Continue working with physical therapy   ESRD (end stage renal disease) (HCC) TThS Clotted dialysis access Anemia of end-stage renal disease. PermCath exchange by vascular surgery on 9/11 and 10/3.  Hemodialysis as per nephrology   Depression On Wellbutrin  and Effexor    Vitamin D  deficiency Supplement vitamin D    Paroxysmal atrial fibrillation Not on anticoagulation. Holding Toprol  with low blood pressure.   Currently in sinus.    Obesity (BMI 30-39.9) class II. BMI 37.44  Body mass index is 36.44 kg/m.  VTE ppx: heparin  injection 5,000 Units Start: 04/15/24 2200  Diet:     Diet   Diet regular Room service appropriate? Yes; Fluid consistency: Thin   Consultants: Nephrology  Vascular surgery   Subjective 06/12/24    Pt reports no concerns today. He is stable. Seen in HD.    Objective  Blood pressure (!) 156/110, pulse 65, temperature 97.9 F (36.6 C), resp. rate 17, height 5' 8 (1.727 m), weight 108.9 kg, SpO2 96%.  Intake/Output Summary (Last 24 hours) at 06/12/2024 0809 Last data filed at 06/11/2024 0900 Gross per 24 hour  Intake 0 ml  Output --  Net 0 ml   Filed Weights   06/07/24 1251 06/09/24 0855 06/09/24 1300  Weight: 108.7 kg 108.7 kg 108.7 kg    Physical Exam:  General: asleep, NAD HEENT: atraumatic, clear conjunctiva, anicteric sclera, MMM, hearing grossly normal Respiratory: normal respiratory effort. Cardiovascular: extremities well perfused Nervous: A&O x3. no gross focal neurologic deficits, normal speech Extremities: moves all equally, no edema, normal tone Skin: dry, intact, normal temperature, normal color. No rashes, lesions or ulcers on exposed skin Psychiatry: normal mood, congruent affect  Labs   I have personally reviewed the following labs and imaging studies CBC    Component Value Date/Time   WBC 7.0 06/09/2024 0908   RBC 3.48 (L) 06/09/2024 0908   HGB 10.7 (L) 06/09/2024 0908   HGB 16.3 02/12/2021 1604   HCT 32.9 (L) 06/09/2024 0908   HCT 47.9 02/12/2021 1604   PLT 282 06/09/2024 0908  PLT 252 02/12/2021 1604   MCV 94.5 06/09/2024 0908   MCV 92 02/12/2021 1604   MCH 30.7 06/09/2024 0908   MCHC 32.5 06/09/2024 0908   RDW 13.3 06/09/2024 0908   RDW 13.2 02/12/2021 1604   LYMPHSABS 2.1 06/02/2024 0730   LYMPHSABS 2.7 02/12/2021 1604   MONOABS 0.9 06/02/2024 0730   EOSABS 0.1 06/02/2024 0730   EOSABS 0.3 02/12/2021 1604   BASOSABS 0.1 06/02/2024 0730    BASOSABS 0.1 02/12/2021 1604      Latest Ref Rng & Units 06/09/2024    9:08 AM 06/07/2024    4:33 AM 06/05/2024    8:20 AM  BMP  Glucose 70 - 99 mg/dL 87  89  97   BUN 6 - 20 mg/dL 17  23  24    Creatinine 0.61 - 1.24 mg/dL 2.22  1.50  0.16   Sodium 135 - 145 mmol/L 131  130  135   Potassium 3.5 - 5.1 mmol/L 3.6  4.2  3.7   Chloride 98 - 111 mmol/L 91  94  96   CO2 22 - 32 mmol/L 22  23  22    Calcium  8.9 - 10.3 mg/dL 9.3  9.7  9.6    No results found.  Disposition Plan & Communication  Patient status: Inpatient  Admitted From: Home Planned disposition location: Skilled nursing facility Anticipated discharge date: TBD pending placement   Family Communication: none at bedside     Author: Marien LITTIE Piety, DO [facility] 06/12/2024, 8:09 AM   Available by Epic secure chat 7AM-7PM. If 7PM-7AM, please contact night-coverage.  [facility] contact information found on [email or web address].

## 2024-06-12 NOTE — Progress Notes (Signed)
  Received patient in bed to unit.   Informed consent signed and in chart.    TX duration:2:30     Transported by  Hand-off given to patient's nurse.    Access used: Right internal jugular cath Access issues: high arterial pressures. Unable to run 400 bfr. Last hour and ten mins pt was about to clot. Pt was rinsed back and was instilled with cath flow per NP Breeze orders.    Total UF removed: unable to get 500 off -0.3 Medication(s) given: heparin  2700 machine maintenance and Cath flow 1.6 in both venous and arterial lines. Post HD VS: wnl Post HD weight: 107.6 kg     N. Jarae Panas LPN [facility]

## 2024-06-13 DIAGNOSIS — I951 Orthostatic hypotension: Secondary | ICD-10-CM | POA: Diagnosis not present

## 2024-06-13 NOTE — Progress Notes (Signed)
 PROGRESS NOTE Brady Edwards    DOB: [birth year], 58 y.o.  FMW:[id number]    Code Status: Full Code   DOA: 04/15/2024   LOS: 55  Brief hospital course  Brady Edwards is a 58 y.o. male with a PMH significant for newly ESRD recently started on HD, atrial fibrillation, anxiety and depression, morbid obesity who presented with complaints of weakness and falls. Of note, pt was hospitalized from 04/07/24 to 04/13/24 during which he was started on dialysis, and was discharged after dialysis on 8/15 to continue outpatient dialysis TTS.  Patient currently receiving routine HD and monitoring with medication adjustments for severe orthostatic hypotension while pending nursing home placement and outpatient HD arrangements.  Assessment & Plan  Symptomatic Orthostatic hypotension- patient has not been getting midodrine  due to his hypertension at rest, discontinued - continue on florinef  0.3mg  daily with very slow taper with optimism that his symptoms will improve with ongoing physical therapy and adjustments to HD.  - Continue TED hose, abdominal binder, lifestyle changes - continue PT/OT, mobilizing patient as much as possible   General weakness Continue working with physical therapy   ESRD (end stage renal disease) (HCC) TThS Clotted dialysis access Anemia of end-stage renal disease. PermCath exchange by vascular surgery on 9/11 and 10/3.  Hemodialysis as per nephrology   Depression On Wellbutrin  and Effexor    Vitamin D  deficiency Supplement vitamin D    Paroxysmal atrial fibrillation Not on anticoagulation. Holding Toprol  with low blood pressure.   Currently in sinus.   Obesity (BMI 30-39.9) class II. BMI 37.44  Body mass index is 36.07 kg/m.  VTE ppx: heparin  injection 5,000 Units Start: 04/15/24 2200  Diet:     Diet   Diet regular Room service appropriate? Yes; Fluid consistency: Thin   Consultants: Nephrology  Vascular surgery   Subjective 06/13/24    Pt reports no  concerns today, stable   Objective  Blood pressure (!) 156/110, pulse 65, temperature 97.9 F (36.6 C), resp. rate 17, height 5' 8 (1.727 m), weight 108.9 kg, SpO2 96%.  Intake/Output Summary (Last 24 hours) at 06/13/2024 0822 Last data filed at 06/13/2024 0429 Gross per 24 hour  Intake 600 ml  Output -0.3 ml  Net 600.3 ml   Filed Weights   06/09/24 1300 06/12/24 0844 06/12/24 1227  Weight: 108.7 kg 107.6 kg 107.6 kg    Physical Exam:  General: asleep, NAD Respiratory: normal respiratory effort. Cardiovascular: extremities well perfused Nervous: A&O x3. no gross focal neurologic deficits, normal speech Extremities: moves all equally, no edema, normal tone Skin: dry, intact, normal temperature, normal color. No rashes, lesions or ulcers on exposed skin  Labs   I have personally reviewed the following labs and imaging studies CBC    Component Value Date/Time   WBC 6.6 06/12/2024 0900   RBC 3.57 (L) 06/12/2024 0900   HGB 11.2 (L) 06/12/2024 0900   HGB 16.3 02/12/2021 1604   HCT 33.0 (L) 06/12/2024 0900   HCT 47.9 02/12/2021 1604   PLT 263 06/12/2024 0900   PLT 252 02/12/2021 1604   MCV 92.4 06/12/2024 0900   MCV 92 02/12/2021 1604   MCH 31.4 06/12/2024 0900   MCHC 33.9 06/12/2024 0900   RDW 13.2 06/12/2024 0900   RDW 13.2 02/12/2021 1604   LYMPHSABS 2.1 06/02/2024 0730   LYMPHSABS 2.7 02/12/2021 1604   MONOABS 0.9 06/02/2024 0730   EOSABS 0.1 06/02/2024 0730   EOSABS 0.3 02/12/2021 1604   BASOSABS 0.1 06/02/2024 0730   BASOSABS 0.1  02/12/2021 1604      Latest Ref Rng & Units 06/12/2024    9:00 AM 06/09/2024    9:08 AM 06/07/2024    4:33 AM  BMP  Glucose 70 - 99 mg/dL 92  87  89   BUN 6 - 20 mg/dL 21  17  23    Creatinine 0.61 - 1.24 mg/dL 1.37  2.22  1.50   Sodium 135 - 145 mmol/L 131  131  130   Potassium 3.5 - 5.1 mmol/L 3.6  3.6  4.2   Chloride 98 - 111 mmol/L 91  91  94   CO2 22 - 32 mmol/L 23  22  23    Calcium  8.9 - 10.3 mg/dL 9.4  9.3  9.7    No  results found.  Disposition Plan & Communication  Patient status: Inpatient  Admitted From: Home Planned disposition location: Skilled nursing facility Anticipated discharge date: TBD pending placement   Family Communication: none at bedside     Author: [name], MD [facility] 06/13/2024, 8:22 AM   Available by Epic secure chat 7AM-7PM. If 7PM-7AM, please contact night-coverage.  [facility] contact information found on [email or web address].

## 2024-06-13 NOTE — Progress Notes (Signed)
 Mobility Specialist Progress Note:    06/13/24 0829  Orthostatic Lying   BP- Lying (!) 137/93  Pulse- Lying 91  Orthostatic Sitting  BP- Sitting 101/81  Pulse- Sitting 104  Orthostatic Standing at 0 minutes  BP- Standing at 0 minutes (!) 88/72  Pulse- Standing at 0 minutes 108  Mobility  Activity Ambulated with assistance  Level of Assistance Modified independent, requires aide device or extra time  Assistive Device None  Distance Ambulated (ft) 400 ft  Range of Motion/Exercises Active;All extremities  Activity Response Tolerated well  Mobility visit 1 Mobility  Mobility Specialist Start Time (ACUTE ONLY) [id number]  Mobility Specialist Stop Time (ACUTE ONLY) 0850  Mobility Specialist Time Calculation (min) (ACUTE ONLY) 22 min   Ted hose, ace wraps, and abdominal binder placed prior to mobility. After orthostatics pt returned seated for rest break, only having mild symptoms today. After ambulation BP 154/107 and HR 102 bpm. Returned pt supine, all needs met.  Sherrilee Ditty Mobility Specialist Please contact via Special educational needs teacher or  Rehab office at [phone]

## 2024-06-13 NOTE — TOC Progression Note (Signed)
 Transition of Care [facility]) - Progression Note    Patient Details  Name: Brady Edwards MRN: [id number] Date of Birth: [birth year]  Transition of Care [facility]) CM/SW Contact  Alvaro Louder, [place] Phone Number: 06/13/2024, 4:08 PM  Clinical Narrative:     Per [facility] Admin Coordinator they are waiting on [employer] to financially approve chair at [facility]. LCSWA left VM regarding possible placement tomorrow if approved.   TOC to follow up with [facility] tomorrow.                      Expected Discharge Plan and Services                                               Social Drivers of Health (SDOH) Interventions SDOH Screenings   Food Insecurity: Food Insecurity Present (04/16/2024)  Housing: High Risk (04/16/2024)  Transportation Needs: Unmet Transportation Needs (04/16/2024)  Utilities: At Risk (04/16/2024)  Depression (PHQ2-9): High Risk (02/27/2024)  Tobacco Use: High Risk (05/19/2024)    Readmission Risk Interventions     No data to display

## 2024-06-13 NOTE — Progress Notes (Signed)
 Mobility Specialist Progress Note:    06/13/24 1554  Orthostatic Lying   BP- Lying 125/84  Pulse- Lying 97  Orthostatic Sitting  BP- Sitting 94/63  Pulse- Sitting 123  Orthostatic Standing at 0 minutes  BP- Standing at 0 minutes (!) 82/56  Pulse- Standing at 0 minutes 120  Mobility  Activity Ambulated with assistance  Level of Assistance Modified independent, requires aide device or extra time  Assistive Device None  Distance Ambulated (ft) 300 ft  Range of Motion/Exercises Active;All extremities  Activity Response Tolerated well  Mobility visit 1 Mobility  Mobility Specialist Start Time (ACUTE ONLY) 1548  Mobility Specialist Stop Time (ACUTE ONLY) 1611  Mobility Specialist Time Calculation (min) (ACUTE ONLY) 23 min   Ted hose, ace wraps, and abdominal binder placed prior to mobility. Returned seated for rest break after orthostatic vitals. After ambulation, BP 125/98 and HR 114 bpm. Pt only reports mild symptoms during this session. Returned pt supine, all needs met.  Sherrilee Ditty Mobility Specialist Please contact via Special educational needs teacher or  Rehab office at [phone]

## 2024-06-14 DIAGNOSIS — I951 Orthostatic hypotension: Secondary | ICD-10-CM | POA: Diagnosis not present

## 2024-06-14 LAB — RENAL FUNCTION PANEL
Albumin: 3.4 g/dL — ABNORMAL LOW (ref 3.5–5.0)
Anion gap: 15 (ref 5–15)
BUN: 16 mg/dL (ref 6–20)
CO2: 22 mmol/L (ref 22–32)
Calcium: 9.1 mg/dL (ref 8.9–10.3)
Chloride: 95 mmol/L — ABNORMAL LOW (ref 98–111)
Creatinine, Ser: 8.01 mg/dL — ABNORMAL HIGH (ref 0.61–1.24)
GFR, Estimated: 7 mL/min — ABNORMAL LOW (ref >60)
Glucose, Bld: 135 mg/dL — ABNORMAL HIGH (ref 70–99)
Phosphorus: 3.4 mg/dL (ref 2.5–4.6)
Potassium: 3.2 mmol/L — ABNORMAL LOW (ref 3.5–5.1)
Sodium: 132 mmol/L — ABNORMAL LOW (ref 135–145)

## 2024-06-14 LAB — CBC WITH DIFFERENTIAL/PLATELET
Abs Immature Granulocytes: 0.03 K/uL (ref 0.00–0.07)
Basophils Absolute: 0.1 K/uL (ref 0.0–0.1)
Basophils Relative: 1 %
Eosinophils Absolute: 0.2 K/uL (ref 0.0–0.5)
Eosinophils Relative: 3 %
HCT: 34.1 % — ABNORMAL LOW (ref 39.0–52.0)
Hemoglobin: 11 g/dL — ABNORMAL LOW (ref 13.0–17.0)
Immature Granulocytes: 0 %
Lymphocytes Relative: 32 %
Lymphs Abs: 2.3 K/uL (ref 0.7–4.0)
MCH: 30.6 pg (ref 26.0–34.0)
MCHC: 32.3 g/dL (ref 30.0–36.0)
MCV: 94.7 fL (ref 80.0–100.0)
Monocytes Absolute: 0.8 K/uL (ref 0.1–1.0)
Monocytes Relative: 11 %
Neutro Abs: 3.8 K/uL (ref 1.7–7.7)
Neutrophils Relative %: 53 %
Platelets: 264 K/uL (ref 150–400)
RBC: 3.6 MIL/uL — ABNORMAL LOW (ref 4.22–5.81)
RDW: 13.4 % (ref 11.5–15.5)
WBC: 7.2 K/uL (ref 4.0–10.5)
nRBC: 0 % (ref 0.0–0.2)

## 2024-06-14 MED ORDER — HEPARIN SODIUM (PORCINE) 1000 UNIT/ML IJ SOLN
INTRAMUSCULAR | Status: AC
Start: 2024-06-14 — End: 2024-06-14
  Filled 2024-06-14: qty 4

## 2024-06-14 MED ORDER — FLUDROCORTISONE ACETATE 0.1 MG PO TABS: ORAL_TABLET | ORAL | Status: DC

## 2024-06-14 MED ORDER — VITAMIN D3 25 MCG PO TABS: 1000.0000 [IU] | ORAL_TABLET | Freq: Every day | ORAL | Status: AC

## 2024-06-14 MED ORDER — CALCITRIOL 0.25 MCG PO CAPS: 0.2500 ug | ORAL_CAPSULE | Freq: Every day | ORAL | Status: DC

## 2024-06-14 MED ORDER — SEVELAMER CARBONATE 800 MG PO TABS: 1600.0000 mg | ORAL_TABLET | Freq: Three times a day (TID) | ORAL | Status: DC

## 2024-06-14 NOTE — Plan of Care (Signed)
   Problem: Education: Goal: Knowledge of General Education information will improve Description Including pain rating scale, medication(s)/side effects and non-pharmacologic comfort measures Outcome: Progressing

## 2024-06-14 NOTE — Progress Notes (Addendum)
 Pt accepted at SNF, [facility]. Dialysis will be done on a second shift schedule at [facility]. Navigator following to assist with any HD needs.   Suzen Satchel Dialysis Navigator [phone]

## 2024-06-14 NOTE — TOC CM/SW Note (Deleted)
 Transition of Care (TOC) CM/SW Note   To Whom It May Concern:   Please be advised that the above-named patient will require a short-term nursing home stay - anticipated 30 days or less for rehabilitation and strengthening.  The plan is for return home

## 2024-06-14 NOTE — TOC CM/SW Note (Signed)
 Transition of Care (TOC) CM/SW Note   To Whom It May Concern:   Please be advised that the above-named patient will require a short-term nursing home stay - anticipated 30 days or less for rehabilitation and strengthening.  The plan is for return home

## 2024-06-14 NOTE — Plan of Care (Signed)
  Problem: Clinical Measurements: Goal: Ability to maintain clinical measurements within normal limits will improve Outcome: Progressing Goal: Will remain free from infection Outcome: Progressing   Problem: Activity: Goal: Risk for activity intolerance will decrease Outcome: Progressing   Problem: Nutrition: Goal: Adequate nutrition will be maintained Outcome: Progressing   Problem: Coping: Goal: Level of anxiety will decrease Outcome: Progressing   Problem: Safety: Goal: Ability to remain free from injury will improve Outcome: Progressing

## 2024-06-14 NOTE — Progress Notes (Signed)
 PROGRESS NOTE Brady Edwards    DOB: [birth year], 58 y.o.  FMW:[id number]    Code Status: Full Code   DOA: 04/15/2024   LOS: 56  Brief hospital course  Brady Edwards is a 58 y.o. male with a PMH significant for newly ESRD recently started on HD, atrial fibrillation, anxiety and depression, morbid obesity who presented with complaints of weakness and falls. Of note, pt was hospitalized from 04/07/24 to 04/13/24 during which he was started on dialysis, and was discharged after dialysis on 8/15 to continue outpatient dialysis TTS.  Patient currently receiving routine HD and monitoring with medication adjustments for severe orthostatic hypotension while pending nursing home placement and outpatient HD arrangements.  Assessment & Plan  Symptomatic Orthostatic hypotension- patient has not been getting midodrine  due to his hypertension at rest, discontinued - continue on florinef  daily with very slow taper with optimism that his symptoms will improve with ongoing physical therapy and adjustments to HD.  - Continue TED hose, abdominal binder, lifestyle changes - continue PT/OT, mobilizing patient as much as possible   General weakness Continue working with physical therapy   ESRD (end stage renal disease) (HCC) TThS Clotted dialysis access Anemia of end-stage renal disease. PermCath exchange by vascular surgery on 9/11 and 10/3.  Hemodialysis as per nephrology Calcitriol , Sevelamer    Depression On Wellbutrin  and Effexor    Vitamin D  deficiency Supplement vitamin D    Paroxysmal atrial fibrillation Not on anticoagulation. Holding Toprol  with low blood pressure.   Currently in sinus.  HTN Hold metoprolol  and Lisinopril    Obesity (BMI 30-39.9) class II. BMI 37.44  Body mass index is 35.8 kg/m.  VTE ppx: heparin  injection 5,000 Units Start: 04/15/24 2200  Diet:     Diet   Diet regular Room service appropriate? Yes; Fluid consistency: Thin   Consultants: Nephrology  Vascular  surgery   Subjective 06/14/24    Pt reports no concerns today, stable. Anticipate discharge tomorrow, TOC update   Objective  Blood pressure (!) 156/110, pulse 65, temperature 97.9 F (36.6 C), resp. rate 17, height 5' 8 (1.727 m), weight 108.9 kg, SpO2 96%.  Intake/Output Summary (Last 24 hours) at 06/14/2024 1550 Last data filed at 06/14/2024 1215 Gross per 24 hour  Intake --  Output 0 ml  Net 0 ml   Filed Weights   06/12/24 0844 06/12/24 1227 06/14/24 0725  Weight: 107.6 kg 107.6 kg 106.8 kg    Physical Exam:  General: asleep, NAD Respiratory: normal respiratory effort. Cardiovascular: extremities well perfused Nervous: A&O x3. no gross focal neurologic deficits, normal speech Extremities: moves all equally, no edema, normal tone Skin: dry, intact, normal temperature, normal color. No rashes, lesions or ulcers on exposed skin  Labs   I have personally reviewed the following labs and imaging studies CBC    Component Value Date/Time   WBC 7.2 06/14/2024 0840   RBC 3.60 (L) 06/14/2024 0840   HGB 11.0 (L) 06/14/2024 0840   HGB 16.3 02/12/2021 1604   HCT 34.1 (L) 06/14/2024 0840   HCT 47.9 02/12/2021 1604   PLT 264 06/14/2024 0840   PLT 252 02/12/2021 1604   MCV 94.7 06/14/2024 0840   MCV 92 02/12/2021 1604   MCH 30.6 06/14/2024 0840   MCHC 32.3 06/14/2024 0840   RDW 13.4 06/14/2024 0840   RDW 13.2 02/12/2021 1604   LYMPHSABS 2.3 06/14/2024 0840   LYMPHSABS 2.7 02/12/2021 1604   MONOABS 0.8 06/14/2024 0840   EOSABS 0.2 06/14/2024 0840   EOSABS 0.3 02/12/2021  1604   BASOSABS 0.1 06/14/2024 0840   BASOSABS 0.1 02/12/2021 1604      Latest Ref Rng & Units 06/14/2024    8:40 AM 06/12/2024    9:00 AM 06/09/2024    9:08 AM  BMP  Glucose 70 - 99 mg/dL 864  92  87   BUN 6 - 20 mg/dL 16  21  17    Creatinine 0.61 - 1.24 mg/dL 1.98  1.37  2.22   Sodium 135 - 145 mmol/L 132  131  131   Potassium 3.5 - 5.1 mmol/L 3.2  3.6  3.6   Chloride 98 - 111 mmol/L 95  91  91    CO2 22 - 32 mmol/L 22  23  22    Calcium  8.9 - 10.3 mg/dL 9.1  9.4  9.3    No results found.  Disposition Plan & Communication  Patient status: Inpatient  Admitted From: Home Planned disposition location: Skilled nursing facility Anticipated discharge date: 06/15/24  Family Communication: none at bedside     Author: [name], MD [facility] 06/14/2024, 3:50 PM   Available by Epic secure chat 7AM-7PM. If 7PM-7AM, please contact night-coverage.  [facility] contact information found on [email or web address].

## 2024-06-14 NOTE — Progress Notes (Signed)
 [facility]  ROUNDING NOTE   Subjective:   Patient seen and evaluated during dialysis   HEMODIALYSIS FLOWSHEET:  Blood Flow Rate (mL/min): 299 mL/min Arterial Pressure (mmHg): -143.63 mmHg Venous Pressure (mmHg): 117.57 mmHg TMP (mmHg): 13.73 mmHg Ultrafiltration Rate (mL/min): 404 mL/min Dialysate Flow Rate (mL/min): 299 ml/min Dialysis Fluid Bolus: Normal Saline Bolus Amount (mL): 200 mL  Tolerating treatment seated in chair   Objective:  Vital signs in last 24 hours:  Temp:  [98.1 F (36.7 C)-98.4 F (36.9 C)] 98.3 F (36.8 C) (10/16 0725) Pulse Rate:  [77-95] 90 (10/16 1030) Resp:  [14-21] 19 (10/16 1030) BP: (109-133)/(84-98) 133/89 (10/16 1030) SpO2:  [97 %-100 %] 99 % (10/16 1030) Weight:  [106.8 kg] 106.8 kg (10/16 0725)  Weight change:  Filed Weights   06/12/24 0844 06/12/24 1227 06/14/24 0725  Weight: 107.6 kg 107.6 kg 106.8 kg    Intake/Output: I/O last 3 completed shifts: In: 600 [P.O.:600] Out: -    Intake/Output this shift:  No intake/output data recorded.  Physical Exam: General: NAD  Head: Normocephalic  Eyes: Anicteric  Lungs:  Clear, room air  Heart: Regular rate and rhythm  Abdomen:  Soft, nontender, bowel sounds heard  Extremities:  no peripheral edema.  Neurologic: Alert and awake  Skin: Warm, intact  Access: Right Permcath    Basic Metabolic Panel: Recent Labs  Lab 06/09/24 0908 06/12/24 0900 06/14/24 0840  NA 131* 131* 132*  K 3.6 3.6 3.2*  CL 91* 91* 95*  CO2 22 23 22   GLUCOSE 87 92 135*  BUN 17 21* 16  CREATININE 7.77* 8.62* 8.01*  CALCIUM  9.3 9.4 9.1  PHOS 4.0 4.1 3.4    Liver Function Tests: Recent Labs  Lab 06/09/24 0908 06/12/24 0900 06/14/24 0840  ALBUMIN  3.5 3.9 3.4*   No results for input(s): LIPASE, AMYLASE in the last 168 hours. No results for input(s): AMMONIA in the last 168 hours.  CBC: Recent Labs  Lab 06/07/24 1254 06/09/24 0908 06/12/24 0900 06/14/24 0840  WBC 7.0  7.0 6.6 7.2  NEUTROABS  --   --   --  3.8  HGB 11.0* 10.7* 11.2* 11.0*  HCT 32.5* 32.9* 33.0* 34.1*  MCV 93.7 94.5 92.4 94.7  PLT 255 282 263 264    Cardiac Enzymes: No results for input(s): CKTOTAL, CKMB, CKMBINDEX, TROPONINI in the last 168 hours.  BNP: Invalid input(s): POCBNP  CBG: No results for input(s): GLUCAP in the last 168 hours.  Microbiology: Results for orders placed or performed during the hospital encounter of 04/07/24  Resp panel by RT-PCR (RSV, Flu A&B, Covid) Anterior Nasal Swab     Status: None   Collection Time: 04/07/24  4:46 PM   Specimen: Anterior Nasal Swab  Result Value Ref Range Status   SARS Coronavirus 2 by RT PCR NEGATIVE NEGATIVE Final    Comment: (NOTE) SARS-CoV-2 target nucleic acids are NOT DETECTED.  The SARS-CoV-2 RNA is generally detectable in upper respiratory specimens during the acute phase of infection. The lowest concentration of SARS-CoV-2 viral copies this assay can detect is 138 copies/mL. A negative result does not preclude SARS-Cov-2 infection and should not be used as the sole basis for treatment or other patient management decisions. A negative result may occur with  improper specimen collection/handling, submission of specimen other than nasopharyngeal swab, presence of viral mutation(s) within the areas targeted by this assay, and inadequate number of viral copies(<138 copies/mL). A negative result must be combined with clinical observations, patient history,  and epidemiological information. The expected result is Negative.  Fact Sheet for Patients:  BloggerCourse.com  Fact Sheet for Healthcare Providers:  SeriousBroker.it  This test is no t yet approved or cleared by the [place]  FDA and  has been authorized for detection and/or diagnosis of SARS-CoV-2 by FDA under an Emergency Use Authorization (EUA). This EUA will remain  in effect (meaning this test  can be used) for the duration of the COVID-19 declaration under Section 564(b)(1) of the Act, 21 U.S.C.section 360bbb-3(b)(1), unless the authorization is terminated  or revoked sooner.       Influenza A by PCR NEGATIVE NEGATIVE Final   Influenza B by PCR NEGATIVE NEGATIVE Final    Comment: (NOTE) The Xpert Xpress SARS-CoV-2/FLU/RSV plus assay is intended as an aid in the diagnosis of influenza from Nasopharyngeal swab specimens and should not be used as a sole basis for treatment. Nasal washings and aspirates are unacceptable for Xpert Xpress SARS-CoV-2/FLU/RSV testing.  Fact Sheet for Patients: BloggerCourse.com  Fact Sheet for Healthcare Providers: SeriousBroker.it  This test is not yet approved or cleared by the [place]  FDA and has been authorized for detection and/or diagnosis of SARS-CoV-2 by FDA under an Emergency Use Authorization (EUA). This EUA will remain in effect (meaning this test can be used) for the duration of the COVID-19 declaration under Section 564(b)(1) of the Act, 21 U.S.C. section 360bbb-3(b)(1), unless the authorization is terminated or revoked.     Resp Syncytial Virus by PCR NEGATIVE NEGATIVE Final    Comment: (NOTE) Fact Sheet for Patients: BloggerCourse.com  Fact Sheet for Healthcare Providers: SeriousBroker.it  This test is not yet approved or cleared by the [place]  FDA and has been authorized for detection and/or diagnosis of SARS-CoV-2 by FDA under an Emergency Use Authorization (EUA). This EUA will remain in effect (meaning this test can be used) for the duration of the COVID-19 declaration under Section 564(b)(1) of the Act, 21 U.S.C. section 360bbb-3(b)(1), unless the authorization is terminated or revoked.  Performed at [facility], [address] [place], [place] [address]     Coagulation Studies: No results  for input(s): LABPROT, INR in the last 72 hours.  Urinalysis: No results for input(s): COLORURINE, LABSPEC, PHURINE, GLUCOSEU, HGBUR, BILIRUBINUR, KETONESUR, PROTEINUR, UROBILINOGEN, NITRITE, LEUKOCYTESUR in the last 72 hours.  Invalid input(s): APPERANCEUR    Imaging: No results found.   Medications:     bisacodyl   10 mg Oral QHS   buPROPion   300 mg Oral Daily   calcitRIOL   0.25 mcg Oral Daily   Chlorhexidine  Gluconate Cloth  6 each Topical Daily   cholecalciferol   1,000 Units Oral Daily   fludrocortisone   0.2 mg Oral Daily   Followed by   NOREEN ON 06/16/2024] fludrocortisone   0.1 mg Oral Daily   Followed by   NOREEN ON 06/30/2024] fludrocortisone   0.05 mg Oral Daily   heparin   5,000 Units Subcutaneous Q8H   influenza vac split trivalent PF  0.5 mL Intramuscular Tomorrow-1000   polyethylene glycol  17 g Oral BID   sevelamer  carbonate  1,600 mg Oral TID WC   venlafaxine  XR  150 mg Oral Q breakfast   And   venlafaxine  XR  75 mg Oral Q breakfast   acetaminophen , bisacodyl , butalbital -acetaminophen -caffeine , ondansetron  (ZOFRAN ) IV, ondansetron   Assessment/ Plan:  Brady Edwards is a 58 y.o.  male  with a PMHx of hypertension, atrial fibrillation, BPH, hyperlipidemia, anxiety, constipation, morbid obesity, and end stage renal disease on hemodialysis.  [facility]- will need to resubmit for chair at discharge.    End stage renal disease on hemodialysis       Appreciate vascular performing permcath exchange on 9/11 and 10/3.  Receiving dialysis today, no UF. Experiencing weight loss, encouraged to maintain oral intake and increase protein. Next treatment scheduled for Saturday. Monitoring discharge plan.     2.  Anemia of chronic kidney disease. Hgb stable, 11.0     3.  Secondary hyperparathyroidism. PTH 199 during recent admission. Continue Renvela  1 tabs TID.           Will continue calcitriol  and sevelamer  with meals.   4. Severe  orthostatic hypotension Workup so far has included 2D echo from 04/11/2024 which shows LVEF 65 to 70%, normal LV systolic function, mild concentric LVH, normal diastolic parameters, normal right ventricular systolic function.  A.m. cortisol level normal from 04/24/2024.  TSH normal.  Continue TEDs. Midodrine  has been stopped and undergoing Florinef  taper. Patient has been able to ambulate as tolerated.        LOS: 56 Brady Edwards 10/16/202510:48 AM

## 2024-06-14 NOTE — TOC Progression Note (Signed)
 Transition of Care [facility]) - Progression Note    Patient Details  Name: Brady Edwards MRN: [id number] Date of Birth: 03-31-1966  Transition of Care [facility]) CM/SW Contact  Alvaro Louder, [place] Phone Number: 06/14/2024, 2:47 PM  Clinical Narrative:  Patient can admit to [facility] when Pass[facility] is back. Awaiting new Pass[facility] number.    TOC to follow for discharge                    Expected Discharge Plan and Services                                               Social Drivers of Health (SDOH) Interventions SDOH Screenings   Food Insecurity: Food Insecurity Present (04/16/2024)  Housing: High Risk (04/16/2024)  Transportation Needs: Unmet Transportation Needs (04/16/2024)  Utilities: At Risk (04/16/2024)  Depression (PHQ2-9): High Risk (02/27/2024)  Tobacco Use: High Risk (05/19/2024)    Readmission Risk Interventions     No data to display

## 2024-06-14 NOTE — Progress Notes (Signed)
   06/14/24 1224  Hemodialysis Catheter Right Internal jugular Double lumen Permanent (Tunneled)  Placement Date/Time: 06/01/24 1649   Serial / Lot #: [id number]  Expiration Date: 10/27/28  Time Out: Correct patient;Correct site;Correct procedure  Maximum sterile barrier precautions: Hand hygiene;Large sterile sheet;Sterile probe cover;Cap;Mask;Ster...  Site Condition No complications  Blue Lumen Status Flushed;Heparin  locked  Red Lumen Status Flushed;Heparin  locked  Purple Lumen Status N/A  Catheter fill solution Heparin  1000 units/ml  Dressing Type Transparent  Dressing Status Antimicrobial disc/dressing in place;Clean, Dry, Intact;Clean  Drainage Description None

## 2024-06-14 NOTE — [place] FL2 (Signed)
 [place]  MEDICAID FL2 LEVEL OF CARE FORM     IDENTIFICATION  Patient Name: Brady Edwards Birthdate: 05/25/1966 Sex: male Admission Date (Current Location): 04/15/2024  [facility] and [place] Number:  Chiropodist and Address:  [facility], [address] [place], [place] [address]      Provider Number: [id number]  Attending Physician Name and Address:  Brady Critchley, MD  Relative Name and Phone Number:       Current Level of Care: Hospital Recommended Level of Care: Skilled Nursing Facility Prior Approval Number:    Date Approved/Denied:   PASRR Number:    Discharge Plan: SNF    Current Diagnoses: Patient Active Problem List   Diagnosis Date Noted   History of atrial fibrillation 05/24/2024   Vitamin D  deficiency 05/24/2024   ESRD (end stage renal disease) (HCC) 05/23/2024   Anemia of chronic disease 05/23/2024   Obesity (BMI 30-39.9) 05/23/2024   Clotted dialysis access 05/10/2024   Orthostatic hypotension 04/19/2024   General weakness 04/15/2024   Encounter for dialysis and dialysis catheter care 04/10/2024   Hyperkalemia 04/08/2024   Metabolic acidosis 04/08/2024   Hypocalcemia 04/08/2024   Renal failure (ARF), acute on chronic 04/07/2024   Obesity, Class III, BMI 40-49.9 (morbid obesity) (HCC) 04/07/2024   Depression 04/07/2024   Atrial fibrillation, chronic (HCC) 04/07/2024   Fatigue 04/07/2024   OSA (obstructive sleep apnea) 02/27/2024   BPH (benign prostatic hyperplasia) 10/06/2018   Hyperlipidemia 08/29/2018   Chest pain at rest 01/16/2018   Tobacco abuse 06/13/2015   Stress at home 06/13/2015   Heart palpitations 05/16/2015   Panic disorder with agoraphobia 05/16/2015    Orientation RESPIRATION BLADDER Height & Weight     Self, Time, Situation, Place  Normal Continent Weight: 235 lb 7.2 oz (106.8 kg) Height:  5' 8 (172.7 cm)  BEHAVIORAL SYMPTOMS/MOOD NEUROLOGICAL BOWEL NUTRITION STATUS      Continent  Diet (Regular)  AMBULATORY STATUS COMMUNICATION OF NEEDS Skin   Limited Assist Verbally Normal                       Personal Care Assistance Level of Assistance  Bathing, Feeding, Dressing Bathing Assistance: Limited assistance Feeding assistance: Independent Dressing Assistance: Limited assistance     Functional Limitations Info  Sight, Hearing, Speech Sight Info: Adequate Hearing Info: Adequate Speech Info: Adequate    SPECIAL CARE FACTORS FREQUENCY  PT (By licensed PT), OT (By licensed OT)     PT Frequency: 5x/week OT Frequency: 5x/week            Contractures      Additional Factors Info  Code Status, Allergies Code Status Info: Full Allergies Info: NKA           Current Medications (06/14/2024):  This is the current hospital active medication list Current Facility-Administered Medications  Medication Dose Route Frequency Provider Last Rate Last Admin   acetaminophen  (TYLENOL ) tablet 650 mg  650 mg Oral TID PRN Edwards, Brady S, MD       bisacodyl  (DULCOLAX) EC tablet 10 mg  10 mg Oral QHS Edwards, Brady S, MD   10 mg at 06/13/24 2204   bisacodyl  (DULCOLAX) suppository 10 mg  10 mg Rectal Daily PRN Edwards, Brady S, MD       buPROPion  (WELLBUTRIN  XL) 24 hr tablet 300 mg  300 mg Oral Daily Edwards, Brady S, MD   300 mg at 06/14/24 1258   butalbital -acetaminophen -caffeine  (FIORICET ) 50-325-40 MG per tablet  1 tablet  1 tablet Oral Q6H PRN Edwards, Brady S, MD   1 tablet at 05/13/24 1712   calcitRIOL  (ROCALTROL ) capsule 0.25 mcg  0.25 mcg Oral Daily Edwards, Brady S, MD   0.25 mcg at 06/14/24 1258   Chlorhexidine  Gluconate Cloth 2 % PADS 6 each  6 each Topical Daily Brady Selinda RAMAN, MD   6 each at 06/14/24 1259   cholecalciferol  (VITAMIN D3) 25 MCG (1000 UNIT) tablet 1,000 Units  1,000 Units Oral Daily Edwards, Brady S, MD   1,000 Units at 06/14/24 1259   fludrocortisone  (FLORINEF ) tablet 0.2 mg  0.2 mg Oral Daily Brady Pons L, MD   0.2 mg at 06/14/24 1259   Followed by   Brady ON  Edwards] fludrocortisone  (FLORINEF ) tablet 0.1 mg  0.1 mg Oral Daily Brady Pons CROME, MD       Followed by   Brady Edwards] fludrocortisone  (FLORINEF ) tablet 0.05 mg  0.05 mg Oral Daily Brady Pons CROME, MD       heparin  injection 5,000 Units  5,000 Units Subcutaneous Q8H Edwards, Brady S, MD   5,000 Units at 06/14/24 0532   influenza vac split trivalent PF (FLUZONE ) injection 0.5 mL  0.5 mL Intramuscular Tomorrow-1000 Brady Novel, MD       ondansetron  (ZOFRAN ) injection 4 mg  4 mg Intravenous Q6H PRN Edwards, Brady S, MD   4 mg at 04/26/24 1812   ondansetron  (ZOFRAN -ODT) disintegrating tablet 4 mg  4 mg Oral Q8H PRN Edwards, Brady S, MD   4 mg at 05/16/24 2142   polyethylene glycol (MIRALAX  / GLYCOLAX ) packet 17 g  17 g Oral BID Edwards, Brady S, MD   17 g at 05/20/24 2147   sevelamer  carbonate (RENVELA ) tablet 1,600 mg  1,600 mg Oral TID WC Edwards, Brady S, MD   1,600 mg at 06/14/24 1258   venlafaxine  XR (EFFEXOR -XR) 24 hr capsule 150 mg  150 mg Oral Q breakfast Edwards, Brady S, MD   150 mg at 06/14/24 1258   And   venlafaxine  XR (EFFEXOR -XR) 24 hr capsule 75 mg  75 mg Oral Q breakfast Edwards, Brady S, MD   75 mg at 06/14/24 1258     Discharge Medications: Please see discharge summary for a list of discharge medications.  Relevant Imaging Results:  Relevant Lab Results:   Additional Information SSN: [id number]  Brady Loseke  Vicci, LCSW

## 2024-06-15 DIAGNOSIS — I951 Orthostatic hypotension: Secondary | ICD-10-CM | POA: Diagnosis not present

## 2024-06-15 LAB — GLUCOSE, CAPILLARY: Glucose-Capillary: 140 mg/dL — ABNORMAL HIGH (ref 70–99)

## 2024-06-15 MED ORDER — POTASSIUM CHLORIDE CRYS ER 20 MEQ PO TBCR
20.0000 meq | EXTENDED_RELEASE_TABLET | Freq: Once | ORAL | Status: AC
  Administered 2024-06-15: 20 meq via ORAL
  Filled 2024-06-15: qty 1

## 2024-06-15 MED ORDER — MECLIZINE HCL 25 MG PO TABS
25.0000 mg | ORAL_TABLET | Freq: Three times a day (TID) | ORAL | Status: DC | PRN
  Administered 2024-06-15 – 2024-06-19 (×5): 25 mg via ORAL
  Filled 2024-06-15 (×6): qty 1

## 2024-06-15 NOTE — Progress Notes (Signed)
 PROGRESS NOTE Brady Edwards    DOB: 14-Jul-1966, 58 y.o.  FMW:[id number]    Code Status: Full Code   DOA: 04/15/2024   LOS: 57  Brief hospital course  Brady Edwards is a 58 y.o. male with a PMH significant for newly ESRD recently started on HD, atrial fibrillation, anxiety and depression, morbid obesity who presented with complaints of weakness and falls. Of note, pt was hospitalized from 04/07/24 to 04/13/24 during which he was started on dialysis, and was discharged after dialysis on 8/15 to continue outpatient dialysis TTS.  Patient currently receiving routine HD and monitoring with medication adjustments for severe orthostatic hypotension while pending nursing home placement and outpatient HD arrangements.  Assessment & Plan  Symptomatic Orthostatic hypotension- patient has not been getting midodrine  due to his hypertension at rest, discontinued - continue on florinef  daily with very slow taper with optimism that his symptoms will improve with ongoing physical therapy and adjustments to HD.  - Continue TED hose, abdominal binder, lifestyle changes - continue PT/OT, mobilizing patient as much as possible   General weakness Continue working with physical therapy   ESRD (end stage renal disease) (HCC) TThS Clotted dialysis access Anemia of end-stage renal disease. PermCath exchange by vascular surgery on 9/11 and 10/3.  Hemodialysis as per nephrology Calcitriol , Sevelamer   Mild hypokalemia - Potassium 20 mEQ po   Depression On Wellbutrin  and Effexor    Vitamin D  deficiency Supplement vitamin D    Paroxysmal atrial fibrillation Not on anticoagulation. Holding Toprol  with low blood pressure.   Currently in sinus.  HTN Hold metoprolol  and Lisinopril    Obesity (BMI 30-39.9) class II. BMI 37.44  Body mass index is 35.8 kg/m.  VTE ppx: heparin  injection 5,000 Units Start: 04/15/24 2200  Diet:     Diet   Diet regular Room service appropriate? Yes; Fluid consistency:  Thin   Consultants: Nephrology  Vascular surgery   Subjective 06/15/24    Pt reports no concerns today, stable. Anticipate discharge tomorrow, TOC update   Objective  Blood pressure (!) 156/110, pulse 65, temperature 97.9 F (36.6 C), resp. rate 17, height 5' 8 (1.727 m), weight 108.9 kg, SpO2 96%.  Intake/Output Summary (Last 24 hours) at 06/15/2024 1604 Last data filed at 06/15/2024 1019 Gross per 24 hour  Intake 480 ml  Output --  Net 480 ml   Filed Weights   06/12/24 0844 06/12/24 1227 06/14/24 0725  Weight: 107.6 kg 107.6 kg 106.8 kg    Physical Exam:  General: asleep, NAD Respiratory: normal respiratory effort. Cardiovascular: extremities well perfused Nervous: A&O x3. no gross focal neurologic deficits, normal speech Extremities: moves all equally, no edema, normal tone Skin: dry, intact, normal temperature, normal color. No rashes, lesions or ulcers on exposed skin  Labs   I have personally reviewed the following labs and imaging studies CBC    Component Value Date/Time   WBC 7.2 06/14/2024 0840   RBC 3.60 (L) 06/14/2024 0840   HGB 11.0 (L) 06/14/2024 0840   HGB 16.3 02/12/2021 1604   HCT 34.1 (L) 06/14/2024 0840   HCT 47.9 02/12/2021 1604   PLT 264 06/14/2024 0840   PLT 252 02/12/2021 1604   MCV 94.7 06/14/2024 0840   MCV 92 02/12/2021 1604   MCH 30.6 06/14/2024 0840   MCHC 32.3 06/14/2024 0840   RDW 13.4 06/14/2024 0840   RDW 13.2 02/12/2021 1604   LYMPHSABS 2.3 06/14/2024 0840   LYMPHSABS 2.7 02/12/2021 1604   MONOABS 0.8 06/14/2024 0840   EOSABS  0.2 06/14/2024 0840   EOSABS 0.3 02/12/2021 1604   BASOSABS 0.1 06/14/2024 0840   BASOSABS 0.1 02/12/2021 1604      Latest Ref Rng & Units 06/14/2024    8:40 AM 06/12/2024    9:00 AM 06/09/2024    9:08 AM  BMP  Glucose 70 - 99 mg/dL 864  92  87   BUN 6 - 20 mg/dL 16  21  17    Creatinine 0.61 - 1.24 mg/dL 1.98  1.37  2.22   Sodium 135 - 145 mmol/L 132  131  131   Potassium 3.5 - 5.1 mmol/L 3.2   3.6  3.6   Chloride 98 - 111 mmol/L 95  91  91   CO2 22 - 32 mmol/L 22  23  22    Calcium  8.9 - 10.3 mg/dL 9.1  9.4  9.3    No results found.  Disposition Plan & Communication  Patient status: Inpatient  Admitted From: Home Planned disposition location: Skilled nursing facility Anticipated discharge date: 06/15/24  Family Communication: none at bedside     Author: [name], MD [facility] 06/15/2024, 4:04 PM   Available by Epic secure chat 7AM-7PM. If 7PM-7AM, please contact night-coverage.  [facility] contact information found on [email or web address].

## 2024-06-15 NOTE — TOC Progression Note (Addendum)
 Transition of Care [facility]) - Progression Note    Patient Details  Name: Brady Edwards MRN: [id number] Date of Birth: [birth year]  [facility]) CM/SW Contact  Lauraine JAYSON Carpen, LCSW Phone Number: 06/15/2024, 9:42 AM  Clinical Narrative: Clinicals uploaded into [facility] for PASARR review.    12:52 pm: PASARR still pending.  1:50 pm: PASARR under level 2 review.  2:51 pm: SNF has been updated.  Expected Discharge Plan and Services                                               Social Drivers of Health (SDOH) Interventions SDOH Screenings   Food Insecurity: Food Insecurity Present (04/16/2024)  Housing: High Risk (04/16/2024)  Transportation Needs: Unmet Transportation Needs (04/16/2024)  Utilities: At Risk (04/16/2024)  Depression (PHQ2-9): High Risk (02/27/2024)  Tobacco Use: High Risk (05/19/2024)    Readmission Risk Interventions     No data to display

## 2024-06-15 NOTE — Progress Notes (Addendum)
 1523 Pt refused heparin  injection, notified MD. No further orders

## 2024-06-15 NOTE — Progress Notes (Signed)
 [facility]  ROUNDING NOTE   Subjective:   Patient resting in bed Alert and oriented No complaints to offer   Objective:  Vital signs in last 24 hours:  Temp:  [97.4 F (36.3 C)-98.8 F (37.1 C)] 98.8 F (37.1 C) (10/17 0737) Pulse Rate:  [79-100] 100 (10/17 0737) Resp:  [17-19] 17 (10/17 0737) BP: (121-144)/(77-98) 144/90 (10/17 0737) SpO2:  [94 %-100 %] 100 % (10/17 0737)  Weight change:  Filed Weights   06/12/24 0844 06/12/24 1227 06/14/24 0725  Weight: 107.6 kg 107.6 kg 106.8 kg    Intake/Output: I/O last 3 completed shifts: In: 240 [P.O.:240] Out: 0    Intake/Output this shift:  Total I/O In: 240 [P.O.:240] Out: -   Physical Exam: General: NAD  Head: Normocephalic  Eyes: Anicteric  Lungs:  Clear, room air  Heart: Regular rate and rhythm  Abdomen:  Soft, nontender, bowel sounds heard  Extremities:  no peripheral edema.  Neurologic: Alert and awake  Skin: Warm, intact  Access: Right Permcath    Basic Metabolic Panel: Recent Labs  Lab 06/09/24 0908 06/12/24 0900 06/14/24 0840  NA 131* 131* 132*  K 3.6 3.6 3.2*  CL 91* 91* 95*  CO2 22 23 22   GLUCOSE 87 92 135*  BUN 17 21* 16  CREATININE 7.77* 8.62* 8.01*  CALCIUM  9.3 9.4 9.1  PHOS 4.0 4.1 3.4    Liver Function Tests: Recent Labs  Lab 06/09/24 0908 06/12/24 0900 06/14/24 0840  ALBUMIN  3.5 3.9 3.4*   No results for input(s): LIPASE, AMYLASE in the last 168 hours. No results for input(s): AMMONIA in the last 168 hours.  CBC: Recent Labs  Lab 06/09/24 0908 06/12/24 0900 06/14/24 0840  WBC 7.0 6.6 7.2  NEUTROABS  --   --  3.8  HGB 10.7* 11.2* 11.0*  HCT 32.9* 33.0* 34.1*  MCV 94.5 92.4 94.7  PLT 282 263 264    Cardiac Enzymes: No results for input(s): CKTOTAL, CKMB, CKMBINDEX, TROPONINI in the last 168 hours.  BNP: Invalid input(s): POCBNP  CBG: No results for input(s): GLUCAP in the last 168 hours.  Microbiology: Results for orders  placed or performed during the hospital encounter of 04/07/24  Resp panel by RT-PCR (RSV, Flu A&B, Covid) Anterior Nasal Swab     Status: None   Collection Time: 04/07/24  4:46 PM   Specimen: Anterior Nasal Swab  Result Value Ref Range Status   SARS Coronavirus 2 by RT PCR NEGATIVE NEGATIVE Final    Comment: (NOTE) SARS-CoV-2 target nucleic acids are NOT DETECTED.  The SARS-CoV-2 RNA is generally detectable in upper respiratory specimens during the acute phase of infection. The lowest concentration of SARS-CoV-2 viral copies this assay can detect is 138 copies/mL. A negative result does not preclude SARS-Cov-2 infection and should not be used as the sole basis for treatment or other patient management decisions. A negative result may occur with  improper specimen collection/handling, submission of specimen other than nasopharyngeal swab, presence of viral mutation(s) within the areas targeted by this assay, and inadequate number of viral copies(<138 copies/mL). A negative result must be combined with clinical observations, patient history, and epidemiological information. The expected result is Negative.  Fact Sheet for Patients:  BloggerCourse.com  Fact Sheet for Healthcare Providers:  SeriousBroker.it  This test is no t yet approved or cleared by the [place]  FDA and  has been authorized for detection and/or diagnosis of SARS-CoV-2 by FDA under an Emergency Use Authorization (EUA). This EUA will remain  in effect (meaning this test can be used) for the duration of the COVID-19 declaration under Section 564(b)(1) of the Act, 21 U.S.C.section 360bbb-3(b)(1), unless the authorization is terminated  or revoked sooner.       Influenza A by PCR NEGATIVE NEGATIVE Final   Influenza B by PCR NEGATIVE NEGATIVE Final    Comment: (NOTE) The Xpert Xpress SARS-CoV-2/FLU/RSV plus assay is intended as an aid in the diagnosis of  influenza from Nasopharyngeal swab specimens and should not be used as a sole basis for treatment. Nasal washings and aspirates are unacceptable for Xpert Xpress SARS-CoV-2/FLU/RSV testing.  Fact Sheet for Patients: BloggerCourse.com  Fact Sheet for Healthcare Providers: SeriousBroker.it  This test is not yet approved or cleared by the [place]  FDA and has been authorized for detection and/or diagnosis of SARS-CoV-2 by FDA under an Emergency Use Authorization (EUA). This EUA will remain in effect (meaning this test can be used) for the duration of the COVID-19 declaration under Section 564(b)(1) of the Act, 21 U.S.C. section 360bbb-3(b)(1), unless the authorization is terminated or revoked.     Resp Syncytial Virus by PCR NEGATIVE NEGATIVE Final    Comment: (NOTE) Fact Sheet for Patients: BloggerCourse.com  Fact Sheet for Healthcare Providers: SeriousBroker.it  This test is not yet approved or cleared by the [place]  FDA and has been authorized for detection and/or diagnosis of SARS-CoV-2 by FDA under an Emergency Use Authorization (EUA). This EUA will remain in effect (meaning this test can be used) for the duration of the COVID-19 declaration under Section 564(b)(1) of the Act, 21 U.S.C. section 360bbb-3(b)(1), unless the authorization is terminated or revoked.  Performed at [facility], [address] [place], [place] [address]     Coagulation Studies: No results for input(s): LABPROT, INR in the last 72 hours.  Urinalysis: No results for input(s): COLORURINE, LABSPEC, PHURINE, GLUCOSEU, HGBUR, BILIRUBINUR, KETONESUR, PROTEINUR, UROBILINOGEN, NITRITE, LEUKOCYTESUR in the last 72 hours.  Invalid input(s): APPERANCEUR    Imaging: No results found.   Medications:     bisacodyl   10 mg Oral QHS   buPROPion   300 mg  Oral Daily   calcitRIOL   0.25 mcg Oral Daily   Chlorhexidine  Gluconate Cloth  6 each Topical Daily   cholecalciferol   1,000 Units Oral Daily   fludrocortisone   0.2 mg Oral Daily   Followed by   NOREEN ON 06/16/2024] fludrocortisone   0.1 mg Oral Daily   Followed by   NOREEN ON 06/30/2024] fludrocortisone   0.05 mg Oral Daily   heparin   5,000 Units Subcutaneous Q8H   influenza vac split trivalent PF  0.5 mL Intramuscular Tomorrow-1000   polyethylene glycol  17 g Oral BID   sevelamer  carbonate  1,600 mg Oral TID WC   venlafaxine  XR  150 mg Oral Q breakfast   And   venlafaxine  XR  75 mg Oral Q breakfast   acetaminophen , bisacodyl , butalbital -acetaminophen -caffeine , ondansetron  (ZOFRAN ) IV, ondansetron   Assessment/ Plan:  Brady Edwards is a 58 y.o.  male  with a PMHx of hypertension, atrial fibrillation, BPH, hyperlipidemia, anxiety, constipation, morbid obesity, and end stage renal disease on hemodialysis.    [facility]- will need to resubmit for chair at discharge.    End stage renal disease on hemodialysis       Appreciate vascular performing permcath exchange on 9/11 and 10/3.  Treatment received yesterday,  no UF. Next treatment scheduled on Saturday.    2.  Anemia of chronic kidney disease. Hgb stable, 11.0. No  need for ESA during this admission.     3.  Secondary hyperparathyroidism. PTH 199 during recent admission. Continue Renvela  1 tabs TID.          Bone minerals within optimal range.  Will continue calcitriol  and sevelamer  with meals.   4. Severe orthostatic hypotension Workup so far has included 2D echo from 04/11/2024 which shows LVEF 65 to 70%, normal LV systolic function, mild concentric LVH, normal diastolic parameters, normal right ventricular systolic function.  A.m. cortisol level normal from 04/24/2024.  TSH normal.  Continue TEDs. Midodrine  has been stopped and undergoing slow Florinef  taper. Patient has been able to ambulate as tolerated. May have to  tolerate higher resting blood pressure to compensate for decreased standing pressure.        LOS: 57 Brady Edwards 10/17/202512:21 PM

## 2024-06-16 DIAGNOSIS — I951 Orthostatic hypotension: Secondary | ICD-10-CM | POA: Diagnosis not present

## 2024-06-16 LAB — BASIC METABOLIC PANEL WITH GFR
Anion gap: 11 (ref 5–15)
BUN: 9 mg/dL (ref 6–20)
CO2: 27 mmol/L (ref 22–32)
Calcium: 8.9 mg/dL (ref 8.9–10.3)
Chloride: 96 mmol/L — ABNORMAL LOW (ref 98–111)
Creatinine, Ser: 6.52 mg/dL — ABNORMAL HIGH (ref 0.61–1.24)
GFR, Estimated: 9 mL/min — ABNORMAL LOW (ref >60)
Glucose, Bld: 95 mg/dL (ref 70–99)
Potassium: 3.3 mmol/L — ABNORMAL LOW (ref 3.5–5.1)
Sodium: 134 mmol/L — ABNORMAL LOW (ref 135–145)

## 2024-06-16 MED ORDER — HEPARIN SODIUM (PORCINE) 1000 UNIT/ML IJ SOLN
3200.0000 [IU] | Freq: Once | INTRAMUSCULAR | Status: AC
  Administered 2024-06-16: 3200 [IU]

## 2024-06-16 MED ORDER — POTASSIUM CHLORIDE CRYS ER 20 MEQ PO TBCR
20.0000 meq | EXTENDED_RELEASE_TABLET | Freq: Once | ORAL | Status: AC
  Administered 2024-06-16: 20 meq via ORAL
  Filled 2024-06-16: qty 1

## 2024-06-16 NOTE — Progress Notes (Signed)
 [facility]  PROGRESS NOTE   Subjective:   Patient seen on dialysis.  Vital signs are stable  Objective:  Vital signs: Blood pressure 104/85, pulse 79, temperature 97.7 F (36.5 C), temperature source Oral, resp. rate 15, height 5' 8 (1.727 m), weight 108.4 kg, SpO2 98%. No intake or output data in the 24 hours ending 06/16/24 1322 Filed Weights   06/12/24 1227 06/14/24 0725 06/16/24 1102  Weight: 107.6 kg 106.8 kg 108.4 kg     Physical Exam: General:  No acute distress  Head:  Normocephalic, atraumatic. Moist oral mucosal membranes  Eyes:  Anicteric  Neck:  Supple  Lungs:   Clear to auscultation, normal effort  Heart:  S1S2 no rubs  Abdomen:   Soft, nontender, bowel sounds present  Extremities:  peripheral edema.  Neurologic:  Awake, alert, following commands  Skin:  No lesions  Access:     Basic Metabolic Panel: Recent Labs  Lab 06/12/24 0900 06/14/24 0840 06/16/24 0451  NA 131* 132* 134*  K 3.6 3.2* 3.3*  CL 91* 95* 96*  CO2 23 22 27   GLUCOSE 92 135* 95  BUN 21* 16 9  CREATININE 8.62* 8.01* 6.52*  CALCIUM  9.4 9.1 8.9  PHOS 4.1 3.4  --    GFR: Estimated Creatinine Clearance: 14.7 mL/min (A) (by C-G formula based on SCr of 6.52 mg/dL (H)).  Liver Function Tests: Recent Labs  Lab 06/12/24 0900 06/14/24 0840  ALBUMIN  3.9 3.4*   No results for input(s): LIPASE, AMYLASE in the last 168 hours. No results for input(s): AMMONIA in the last 168 hours.  CBC: Recent Labs  Lab 06/12/24 0900 06/14/24 0840  WBC 6.6 7.2  NEUTROABS  --  3.8  HGB 11.2* 11.0*  HCT 33.0* 34.1*  MCV 92.4 94.7  PLT 263 264     HbA1C: Hgb A1c MFr Bld  Date/Time Value Ref Range Status  04/09/2024 04:27 AM 6.0 (H) 4.8 - 5.6 % Final    Comment:    (NOTE)         Prediabetes: 5.7 - 6.4         Diabetes: >6.4         Glycemic control for adults with diabetes: <7.0   12/13/2017 09:01 AM 5.6 4.8 - 5.6 % Final    Comment:             Prediabetes: 5.7 -  6.4          Diabetes: >6.4          Glycemic control for adults with diabetes: <7.0     Urinalysis: No results for input(s): COLORURINE, LABSPEC, PHURINE, GLUCOSEU, HGBUR, BILIRUBINUR, KETONESUR, PROTEINUR, UROBILINOGEN, NITRITE, LEUKOCYTESUR in the last 72 hours.  Invalid input(s): APPERANCEUR    Imaging: No results found.   Medications:     bisacodyl   10 mg Oral QHS   buPROPion   300 mg Oral Daily   calcitRIOL   0.25 mcg Oral Daily   Chlorhexidine  Gluconate Cloth  6 each Topical Daily   cholecalciferol   1,000 Units Oral Daily   fludrocortisone   0.1 mg Oral Daily   Followed by   NOREEN ON 06/30/2024] fludrocortisone   0.05 mg Oral Daily   heparin   5,000 Units Subcutaneous Q8H   influenza vac split trivalent PF  0.5 mL Intramuscular Tomorrow-1000   polyethylene glycol  17 g Oral BID   sevelamer  carbonate  1,600 mg Oral TID WC   venlafaxine  XR  150 mg Oral Q breakfast   And  venlafaxine  XR  75 mg Oral Q breakfast    Assessment/ Plan:     58 year old white male with history of hypertension, atrial fibrillation, BPH, hyperlipidemia, anxiety and end-stage renal disease on hemodialysis now admitted with history of weakness and severe orthostatic hypotension.  #1: ESRD: Patient is seen on dialysis and has been tolerating well today.  He is being dialyzed on 3K bath.  Advised the staff not to remove any fluids today.  #2: Anemia: Continue anemia protocols.  #3: Secondary hyperparathyroidism: Will continue the vitamin D  and also sevelamer  as phosphate binder.  #4: Orthostatic hypotension: Patient has been on fludrocortisone  at 0.1 mg daily.  Will monitor closely.  #5: Hypokalemia: Agree with potassium supplementation.  Labs and medications reviewed. Will continue to follow along with you.   LOS: 58 Pinkey Edman, MD [facility] 10/18/20251:22 PM

## 2024-06-16 NOTE — Plan of Care (Signed)

## 2024-06-16 NOTE — Progress Notes (Signed)
 PROGRESS NOTE Brady Edwards    DOB: 24-Jul-1966, 58 y.o.  FMW:[id number]    Code Status: Full Code   DOA: 04/15/2024   LOS: 58  Brief hospital course  Brady Edwards is a 58 y.o. male with a PMH significant for newly ESRD recently started on HD, atrial fibrillation, anxiety and depression, morbid obesity who presented with complaints of weakness and falls. Of note, pt was hospitalized from 04/07/24 to 04/13/24 during which he was started on dialysis, and was discharged after dialysis on 8/15 to continue outpatient dialysis TTS.  Patient currently receiving routine HD and monitoring with medication adjustments for severe orthostatic hypotension while pending nursing home placement and outpatient HD arrangements.  Assessment & Plan  Symptomatic Orthostatic hypotension- patient has not been getting midodrine  due to his hypertension at rest, discontinued - continue on florinef  daily with very slow taper with optimism that his symptoms will improve with ongoing physical therapy and adjustments to HD.  - Continue TED hose, abdominal binder, lifestyle changes - continue PT/OT, mobilizing patient as much as possible   General weakness Continue working with physical therapy   ESRD (end stage renal disease) (HCC) TThS Clotted dialysis access Anemia of end-stage renal disease. PermCath exchange by vascular surgery on 9/11 and 10/3.  Hemodialysis as per nephrology Calcitriol , Sevelamer   Mild hypokalemia - Potassium 20 mEQ po   Depression On Wellbutrin  and Effexor    Vitamin D  deficiency Supplement vitamin D    Paroxysmal atrial fibrillation Not on anticoagulation. Holding Toprol  with low blood pressure.   Currently in sinus.  HTN Hold metoprolol  and Lisinopril   BPPV Meclizine prn   Obesity (BMI 30-39.9) class II. BMI 37.44  Body mass index is 36.34 kg/m.  VTE ppx: heparin  injection 5,000 Units Start: 04/15/24 2200  Diet:     Diet   Diet regular Room service appropriate?  Yes; Fluid consistency: Thin   Consultants: Nephrology  Vascular surgery   Subjective 06/16/24    Pt reports no concerns today, stable. Undergoing dialysis Reports intermittent dizziness and room spinning with change in position, trial of meclizine   Objective  Blood pressure (!) 156/110, pulse 65, temperature 97.9 F (36.6 C), resp. rate 17, height 5' 8 (1.727 m), weight 108.9 kg, SpO2 96%. No intake or output data in the 24 hours ending 06/16/24 1431  Filed Weights   06/12/24 1227 06/14/24 0725 06/16/24 1102  Weight: 107.6 kg 106.8 kg 108.4 kg    Physical Exam:  General: asleep, NAD Respiratory: normal respiratory effort. Cardiovascular: extremities well perfused Nervous: A&O x3. no gross focal neurologic deficits, normal speech Extremities: moves all equally, no edema, normal tone Skin: dry, intact, normal temperature, normal color. No rashes, lesions or ulcers on exposed skin  Labs   I have personally reviewed the following labs and imaging studies CBC    Component Value Date/Time   WBC 7.2 06/14/2024 0840   RBC 3.60 (L) 06/14/2024 0840   HGB 11.0 (L) 06/14/2024 0840   HGB 16.3 02/12/2021 1604   HCT 34.1 (L) 06/14/2024 0840   HCT 47.9 02/12/2021 1604   PLT 264 06/14/2024 0840   PLT 252 02/12/2021 1604   MCV 94.7 06/14/2024 0840   MCV 92 02/12/2021 1604   MCH 30.6 06/14/2024 0840   MCHC 32.3 06/14/2024 0840   RDW 13.4 06/14/2024 0840   RDW 13.2 02/12/2021 1604   LYMPHSABS 2.3 06/14/2024 0840   LYMPHSABS 2.7 02/12/2021 1604   MONOABS 0.8 06/14/2024 0840   EOSABS 0.2 06/14/2024 0840  EOSABS 0.3 02/12/2021 1604   BASOSABS 0.1 06/14/2024 0840   BASOSABS 0.1 02/12/2021 1604      Latest Ref Rng & Units 06/16/2024    4:51 AM 06/14/2024    8:40 AM 06/12/2024    9:00 AM  BMP  Glucose 70 - 99 mg/dL 95  864  92   BUN 6 - 20 mg/dL 9  16  21    Creatinine 0.61 - 1.24 mg/dL 3.47  1.98  1.37   Sodium 135 - 145 mmol/L 134  132  131   Potassium 3.5 - 5.1 mmol/L 3.3   3.2  3.6   Chloride 98 - 111 mmol/L 96  95  91   CO2 22 - 32 mmol/L 27  22  23    Calcium  8.9 - 10.3 mg/dL 8.9  9.1  9.4    No results found.  Disposition Plan & Communication  Patient status: Inpatient  Admitted From: Home Planned disposition location: Skilled nursing facility Anticipated discharge date: unknown  Family Communication: none at bedside     Author: [name], MD [facility] 06/16/2024, 2:31 PM   Available by Epic secure chat 7AM-7PM. If 7PM-7AM, please contact night-coverage.  [facility] contact information found on [email or web address].

## 2024-06-16 NOTE — Progress Notes (Signed)
   06/16/24 1447  Vitals  Temp 97.9 F (36.6 C)  BP (!) 118/90  Pulse Rate 87  Resp 19  Weight 108.3 kg  Type of Weight Post-Dialysis  Oxygen Therapy  SpO2 100 %  O2 Device Room Air  Patient Activity (if Appropriate) In chair  Pulse Oximetry Type Continuous  Oximetry Probe Site Changed No  Post Treatment  Dialyzer Clearance Clear  Liters Processed 55.3  Fluid Removed (mL) 0 mL  Tolerated HD Treatment Yes  Post-Hemodialysis Comments Tx has been tolerated. 0 fluid removal as ordered. Vs are stable. No verbalized concerns throughout treatment. Patient is stable to return to assigned floor.

## 2024-06-17 DIAGNOSIS — I951 Orthostatic hypotension: Secondary | ICD-10-CM | POA: Diagnosis not present

## 2024-06-17 LAB — BASIC METABOLIC PANEL WITH GFR
Anion gap: 13 (ref 5–15)
BUN: 6 mg/dL (ref 6–20)
CO2: 26 mmol/L (ref 22–32)
Calcium: 9 mg/dL (ref 8.9–10.3)
Chloride: 93 mmol/L — ABNORMAL LOW (ref 98–111)
Creatinine, Ser: 4.72 mg/dL — ABNORMAL HIGH (ref 0.61–1.24)
GFR, Estimated: 14 mL/min — ABNORMAL LOW (ref >60)
Glucose, Bld: 82 mg/dL (ref 70–99)
Potassium: 3.7 mmol/L (ref 3.5–5.1)
Sodium: 132 mmol/L — ABNORMAL LOW (ref 135–145)

## 2024-06-17 NOTE — Progress Notes (Signed)
 PROGRESS NOTE Brady Edwards    DOB: 30-May-1966, 58 y.o.  FMW:[id number]    Code Status: Full Code   DOA: 04/15/2024   LOS: 59  Brief hospital course  Brady Edwards is a 58 y.o. male with a PMH significant for newly ESRD recently started on HD, atrial fibrillation, anxiety and depression, morbid obesity who presented with complaints of weakness and falls. Of note, pt was hospitalized from 04/07/24 to 04/13/24 during which he was started on dialysis, and was discharged after dialysis on 8/15 to continue outpatient dialysis TTS.  Patient currently receiving routine HD and monitoring with medication adjustments for severe orthostatic hypotension while pending nursing home placement and outpatient HD arrangements.  Assessment & Plan  Symptomatic Orthostatic hypotension- patient has not been getting midodrine  due to his hypertension at rest, discontinued - continue on florinef  daily with very slow taper with optimism that his symptoms will improve with ongoing physical therapy and adjustments to HD.  - Continue TED hose, abdominal binder, lifestyle changes - continue PT/OT, mobilizing patient as much as possible   General weakness Continue working with physical therapy   ESRD (end stage renal disease) (HCC) TThS Clotted dialysis access Anemia of end-stage renal disease. PermCath exchange by vascular surgery on 9/11 and 10/3.  Hemodialysis as per nephrology Calcitriol , Sevelamer   Mild hypokalemia - resolved   Depression On Wellbutrin  and Effexor    Vitamin D  deficiency Supplement vitamin D    Paroxysmal atrial fibrillation Not on anticoagulation. Holding Toprol  with low blood pressure.   Currently in sinus.  HTN Hold metoprolol  and Lisinopril   BPPV Meclizine prn   Obesity (BMI 30-39.9) class II. BMI 37.44  Body mass index is 36.3 kg/m.  VTE ppx: heparin  injection 5,000 Units Start: 04/15/24 2200  Diet:     Diet   Diet regular Room service appropriate? Yes; Fluid  consistency: Thin   Consultants: Nephrology  Vascular surgery   Subjective 06/17/24    Pt reports no concerns today, stable.  Pending placement   Objective  Blood pressure (!) 156/110, pulse 65, temperature 97.9 F (36.6 C), resp. rate 17, height 5' 8 (1.727 m), weight 108.9 kg, SpO2 96%.  Intake/Output Summary (Last 24 hours) at 06/17/2024 1506 Last data filed at 06/17/2024 0943 Gross per 24 hour  Intake 120 ml  Output --  Net 120 ml    Filed Weights   06/14/24 0725 06/16/24 1102 06/16/24 1447  Weight: 106.8 kg 108.4 kg 108.3 kg    Physical Exam:  General: asleep, NAD Respiratory: normal respiratory effort. Cardiovascular: extremities well perfused Nervous: A&O x3. no gross focal neurologic deficits, normal speech Extremities: moves all equally, no edema, normal tone Skin: dry, intact, normal temperature, normal color. No rashes, lesions or ulcers on exposed skin  Labs   I have personally reviewed the following labs and imaging studies CBC    Component Value Date/Time   WBC 7.2 06/14/2024 0840   RBC 3.60 (L) 06/14/2024 0840   HGB 11.0 (L) 06/14/2024 0840   HGB 16.3 02/12/2021 1604   HCT 34.1 (L) 06/14/2024 0840   HCT 47.9 02/12/2021 1604   PLT 264 06/14/2024 0840   PLT 252 02/12/2021 1604   MCV 94.7 06/14/2024 0840   MCV 92 02/12/2021 1604   MCH 30.6 06/14/2024 0840   MCHC 32.3 06/14/2024 0840   RDW 13.4 06/14/2024 0840   RDW 13.2 02/12/2021 1604   LYMPHSABS 2.3 06/14/2024 0840   LYMPHSABS 2.7 02/12/2021 1604   MONOABS 0.8 06/14/2024 0840   EOSABS  0.2 06/14/2024 0840   EOSABS 0.3 02/12/2021 1604   BASOSABS 0.1 06/14/2024 0840   BASOSABS 0.1 02/12/2021 1604      Latest Ref Rng & Units 06/17/2024    4:41 AM 06/16/2024    4:51 AM 06/14/2024    8:40 AM  BMP  Glucose 70 - 99 mg/dL 82  95  864   BUN 6 - 20 mg/dL 6  9  16    Creatinine 0.61 - 1.24 mg/dL 5.27  3.47  1.98   Sodium 135 - 145 mmol/L 132  134  132   Potassium 3.5 - 5.1 mmol/L 3.7  3.3  3.2    Chloride 98 - 111 mmol/L 93  96  95   CO2 22 - 32 mmol/L 26  27  22    Calcium  8.9 - 10.3 mg/dL 9.0  8.9  9.1    No results found.  Disposition Plan & Communication  Patient status: Inpatient  Admitted From: Home Planned disposition location: Skilled nursing facility Anticipated discharge date: unknown  Family Communication: none at bedside     Author: [name], MD [facility] 06/17/2024, 3:06 PM   Available by Epic secure chat 7AM-7PM. If 7PM-7AM, please contact night-coverage.  [facility] contact information found on [email or web address].

## 2024-06-17 NOTE — Progress Notes (Signed)
 [facility]  PROGRESS NOTE   Subjective:   Patient seen at bedside.  Feels much better today.  Had stable dialysis yesterday.  Objective:  Vital signs: Blood pressure (!) 126/99, pulse (!) 101, temperature 98.2 F (36.8 C), resp. rate 18, height 5' 8 (1.727 m), weight 108.3 kg, SpO2 95%.  Intake/Output Summary (Last 24 hours) at 06/17/2024 1505 Last data filed at 06/17/2024 0943 Gross per 24 hour  Intake 120 ml  Output --  Net 120 ml   Filed Weights   06/14/24 0725 06/16/24 1102 06/16/24 1447  Weight: 106.8 kg 108.4 kg 108.3 kg     Physical Exam: General:  No acute distress  Head:  Normocephalic, atraumatic. Moist oral mucosal membranes  Eyes:  Anicteric  Neck:  Supple  Lungs:   Clear to auscultation, normal effort  Heart:  S1S2 no rubs  Abdomen:   Soft, nontender, bowel sounds present  Extremities:  peripheral edema.  Neurologic:  Awake, alert, following commands  Skin:  No lesions  Access:     Basic Metabolic Panel: Recent Labs  Lab 06/12/24 0900 06/14/24 0840 06/16/24 0451 06/17/24 0441  NA 131* 132* 134* 132*  K 3.6 3.2* 3.3* 3.7  CL 91* 95* 96* 93*  CO2 23 22 27 26   GLUCOSE 92 135* 95 82  BUN 21* 16 9 6   CREATININE 8.62* 8.01* 6.52* 4.72*  CALCIUM  9.4 9.1 8.9 9.0  PHOS 4.1 3.4  --   --    GFR: Estimated Creatinine Clearance: 20.4 mL/min (A) (by C-G formula based on SCr of 4.72 mg/dL (H)).  Liver Function Tests: Recent Labs  Lab 06/12/24 0900 06/14/24 0840  ALBUMIN  3.9 3.4*   No results for input(s): LIPASE, AMYLASE in the last 168 hours. No results for input(s): AMMONIA in the last 168 hours.  CBC: Recent Labs  Lab 06/12/24 0900 06/14/24 0840  WBC 6.6 7.2  NEUTROABS  --  3.8  HGB 11.2* 11.0*  HCT 33.0* 34.1*  MCV 92.4 94.7  PLT 263 264     HbA1C: Hgb A1c MFr Bld  Date/Time Value Ref Range Status  04/09/2024 04:27 AM 6.0 (H) 4.8 - 5.6 % Final    Comment:    (NOTE)         Prediabetes: 5.7 - 6.4          Diabetes: >6.4         Glycemic control for adults with diabetes: <7.0   12/13/2017 09:01 AM 5.6 4.8 - 5.6 % Final    Comment:             Prediabetes: 5.7 - 6.4          Diabetes: >6.4          Glycemic control for adults with diabetes: <7.0     Urinalysis: No results for input(s): COLORURINE, LABSPEC, PHURINE, GLUCOSEU, HGBUR, BILIRUBINUR, KETONESUR, PROTEINUR, UROBILINOGEN, NITRITE, LEUKOCYTESUR in the last 72 hours.  Invalid input(s): APPERANCEUR    Imaging: No results found.   Medications:     bisacodyl   10 mg Oral QHS   buPROPion   300 mg Oral Daily   calcitRIOL   0.25 mcg Oral Daily   Chlorhexidine  Gluconate Cloth  6 each Topical Daily   cholecalciferol   1,000 Units Oral Daily   fludrocortisone   0.1 mg Oral Daily   Followed by   NOREEN ON 06/30/2024] fludrocortisone   0.05 mg Oral Daily   heparin   5,000 Units Subcutaneous Q8H   influenza vac split trivalent PF  0.5  mL Intramuscular Tomorrow-1000   polyethylene glycol  17 g Oral BID   sevelamer  carbonate  1,600 mg Oral TID WC   venlafaxine  XR  150 mg Oral Q breakfast   And   venlafaxine  XR  75 mg Oral Q breakfast    Assessment/ Plan:     58 year old white male with history of hypertension, atrial fibrillation, BPH, hyperlipidemia, anxiety and end-stage renal disease on hemodialysis now admitted with history of weakness and severe orthostatic hypotension.   #1: ESRD: Had stable dialysis yesterday.  #2: Anemia: Continue anemia protocols.   #3: Secondary hyperparathyroidism: Will continue the vitamin D  and also sevelamer  as phosphate binder.   #4: Orthostatic hypotension: Patient has been on fludrocortisone  at 0.1 mg daily.  Will monitor closely.   #5: Hypokalemia: Agree with potassium supplementation.  Improved today.    Labs and medications reviewed. Will continue to follow along with you.   LOS: 59 Pinkey Edman, MD [facility] 10/19/20253:05 PM

## 2024-06-18 DIAGNOSIS — I951 Orthostatic hypotension: Secondary | ICD-10-CM | POA: Diagnosis not present

## 2024-06-18 MED ORDER — METOPROLOL SUCCINATE ER 25 MG PO TB24
12.5000 mg | ORAL_TABLET | Freq: Every day | ORAL | Status: DC
  Administered 2024-06-18 – 2024-06-19 (×2): 12.5 mg via ORAL
  Filled 2024-06-18 (×2): qty 1

## 2024-06-18 NOTE — Progress Notes (Signed)
 PROGRESS NOTE NICOLAUS ANDEL    DOB: 06/16/1966, 58 y.o.  FMW:[id number]    Code Status: Full Code   DOA: 04/15/2024   LOS: 60  Brief hospital course  Brady Edwards is a 58 y.o. male with a PMH significant for newly ESRD recently started on HD, atrial fibrillation, anxiety and depression, morbid obesity who presented with complaints of weakness and falls. Of note, pt was hospitalized from 04/07/24 to 04/13/24 during which he was started on dialysis, and was discharged after dialysis on 8/15 to continue outpatient dialysis TTS.  Patient currently receiving routine HD and monitoring with medication adjustments for severe orthostatic hypotension while pending nursing home placement and outpatient HD arrangements.  Assessment & Plan  Symptomatic Orthostatic hypotension- patient has not been getting midodrine  due to his hypertension at rest, discontinued - continue on florinef  daily with very slow taper with optimism that his symptoms will improve with ongoing physical therapy and adjustments to HD.  - Continue TED hose, abdominal binder, lifestyle changes - continue PT/OT, mobilizing patient as much as possible   General weakness Continue working with physical therapy   ESRD (end stage renal disease) (HCC) TThS Clotted dialysis access Anemia of end-stage renal disease. PermCath exchange by vascular surgery on 9/11 and 10/3.  Hemodialysis as per nephrology Calcitriol , Sevelamer   Mild hypokalemia - resolved   Depression On Wellbutrin  and Effexor    Vitamin D  deficiency Supplement vitamin D    Paroxysmal atrial fibrillation Not on anticoagulation. Holding Toprol  with low blood pressure.   Currently in sinus.  HTN Hold metoprolol  and Lisinopril  Will resume low dose Metoprolol  if tachycardic  BPPV Meclizine prn   Obesity (BMI 30-39.9) class II. BMI 37.44  Body mass index is 36.3 kg/m.  VTE ppx: heparin  injection 5,000 Units Start: 04/15/24 2200  Diet:     Diet   Diet  regular Room service appropriate? Yes; Fluid consistency: Thin   Consultants: Nephrology  Vascular surgery   Subjective 06/18/24    Pt reports not feeling well today, states he is sick and likely overexerted himself Pending placement   Objective  Blood pressure (!) 156/110, pulse 65, temperature 97.9 F (36.6 C), resp. rate 17, height 5' 8 (1.727 m), weight 108.9 kg, SpO2 96%.  Intake/Output Summary (Last 24 hours) at 06/18/2024 1346 Last data filed at 06/17/2024 2300 Gross per 24 hour  Intake 240 ml  Output 400 ml  Net -160 ml    Filed Weights   06/14/24 0725 06/16/24 1102 06/16/24 1447  Weight: 106.8 kg 108.4 kg 108.3 kg    Physical Exam:  General: NAD Respiratory: normal respiratory effort. Cardiovascular: extremities well perfused Nervous: A&O x3. no gross focal neurologic deficits, normal speech Extremities: moves all equally, no edema, normal tone Skin: dry, intact, normal temperature, normal color. No rashes, lesions or ulcers on exposed skin  Labs   I have personally reviewed the following labs and imaging studies CBC    Component Value Date/Time   WBC 7.2 06/14/2024 0840   RBC 3.60 (L) 06/14/2024 0840   HGB 11.0 (L) 06/14/2024 0840   HGB 16.3 02/12/2021 1604   HCT 34.1 (L) 06/14/2024 0840   HCT 47.9 02/12/2021 1604   PLT 264 06/14/2024 0840   PLT 252 02/12/2021 1604   MCV 94.7 06/14/2024 0840   MCV 92 02/12/2021 1604   MCH 30.6 06/14/2024 0840   MCHC 32.3 06/14/2024 0840   RDW 13.4 06/14/2024 0840   RDW 13.2 02/12/2021 1604   LYMPHSABS 2.3 06/14/2024 0840  LYMPHSABS 2.7 02/12/2021 1604   MONOABS 0.8 06/14/2024 0840   EOSABS 0.2 06/14/2024 0840   EOSABS 0.3 02/12/2021 1604   BASOSABS 0.1 06/14/2024 0840   BASOSABS 0.1 02/12/2021 1604      Latest Ref Rng & Units 06/17/2024    4:41 AM 06/16/2024    4:51 AM 06/14/2024    8:40 AM  BMP  Glucose 70 - 99 mg/dL 82  95  864   BUN 6 - 20 mg/dL 6  9  16    Creatinine 0.61 - 1.24 mg/dL 5.27  3.47   1.98   Sodium 135 - 145 mmol/L 132  134  132   Potassium 3.5 - 5.1 mmol/L 3.7  3.3  3.2   Chloride 98 - 111 mmol/L 93  96  95   CO2 22 - 32 mmol/L 26  27  22    Calcium  8.9 - 10.3 mg/dL 9.0  8.9  9.1    No results found.  Disposition Plan & Communication  Patient status: Inpatient  Admitted From: Home Planned disposition location: Skilled nursing facility Anticipated discharge date: unknown  Family Communication: none at bedside     Author: [name], MD [facility] 06/18/2024, 1:46 PM   Available by Epic secure chat 7AM-7PM. If 7PM-7AM, please contact night-coverage.  [facility] contact information found on [email or web address].

## 2024-06-18 NOTE — Progress Notes (Signed)
 Mobility Specialist Progress Note:    06/18/24 0813  Therapy Vitals  Temp 97.8 F (36.6 C)  Pulse Rate 94  Resp 16  BP (!) 128/93  Orthostatic Lying   BP- Lying (!) 128/93  Pulse- Lying 94  Orthostatic Sitting  BP- Sitting 97/75  Pulse- Sitting 105  Orthostatic Standing at 0 minutes  BP- Standing at 0 minutes (!) 87/58  Pulse- Standing at 0 minutes 112  Oxygen Therapy  SpO2 100 %  Mobility  Activity Ambulated with assistance  Level of Assistance Modified independent, requires aide device or extra time  Assistive Device None  Distance Ambulated (ft) 300 ft  Range of Motion/Exercises Active;All extremities  Activity Response Tolerated well  Mobility visit 1 Mobility  Mobility Specialist Start Time (ACUTE ONLY) [phone]  Mobility Specialist Stop Time (ACUTE ONLY) 0844  Mobility Specialist Time Calculation (min) (ACUTE ONLY) 37 min   Ted hose, ace wraps, and abdominal binder placed prior to mobility. Pt returned seated after orthostatic vitals to recover. Pt requested to ambulate to gift shop. After ambulation pt experiencing nausea and vomiting. BP 132/99 and HR 142 bpm. Returned pt supine, recovered. HR 112 bpm. All needs met.  Sherrilee Ditty Mobility Specialist Please contact via Special educational needs teacher or  Rehab office at [phone]

## 2024-06-18 NOTE — TOC Progression Note (Signed)
 Transition of Care [facility]) - Progression Note    Patient Details  Name: Brady Edwards MRN: [id number] Date of Birth: May 25, 1966  Transition of Care [facility]) CM/SW Contact  Ellwood Steidle  [place], [place] Phone Number: 06/18/2024, 9:02 AM  Clinical Narrative:  PASARR examinator Ashok Shoulder will be coming to review patient before PASARR number is given. Awaiting PASARR.   TOC to follow for discharge                      Expected Discharge Plan and Services                                               Social Drivers of Health (SDOH) Interventions SDOH Screenings   Food Insecurity: Food Insecurity Present (04/16/2024)  Housing: High Risk (04/16/2024)  Transportation Needs: Unmet Transportation Needs (04/16/2024)  Utilities: At Risk (04/16/2024)  Depression (PHQ2-9): High Risk (02/27/2024)  Tobacco Use: High Risk (05/19/2024)    Readmission Risk Interventions     No data to display

## 2024-06-18 NOTE — Progress Notes (Signed)
 [facility]  ROUNDING NOTE   Subjective:   Patient laying in bed Alert No complaints to offer   Objective:  Vital signs in last 24 hours:  Temp:  [97.8 F (36.6 C)-98.4 F (36.9 C)] 97.8 F (36.6 C) (10/20 0813) Pulse Rate:  [94-113] 94 (10/20 0813) Resp:  [14-18] 16 (10/20 0813) BP: (109-138)/(71-95) 128/93 (10/20 0813) SpO2:  [99 %-100 %] 100 % (10/20 0813)  Weight change:  Filed Weights   06/14/24 0725 06/16/24 1102 06/16/24 1447  Weight: 106.8 kg 108.4 kg 108.3 kg    Intake/Output: I/O last 3 completed shifts: In: 360 [P.O.:360] Out: 400 [Urine:400]   Intake/Output this shift:  No intake/output data recorded.  Physical Exam: General: NAD  Head: Normocephalic  Eyes: Anicteric  Lungs:  Clear, room air  Heart: Regular rate and rhythm  Abdomen:  Soft, nontender, bowel sounds heard  Extremities:  no peripheral edema.  Neurologic: Alert and awake  Skin: Warm, intact  Access: Right Permcath    Basic Metabolic Panel: Recent Labs  Lab 06/12/24 0900 06/14/24 0840 06/16/24 0451 06/17/24 0441  NA 131* 132* 134* 132*  K 3.6 3.2* 3.3* 3.7  CL 91* 95* 96* 93*  CO2 23 22 27 26   GLUCOSE 92 135* 95 82  BUN 21* 16 9 6   CREATININE 8.62* 8.01* 6.52* 4.72*  CALCIUM  9.4 9.1 8.9 9.0  PHOS 4.1 3.4  --   --     Liver Function Tests: Recent Labs  Lab 06/12/24 0900 06/14/24 0840  ALBUMIN  3.9 3.4*   No results for input(s): LIPASE, AMYLASE in the last 168 hours. No results for input(s): AMMONIA in the last 168 hours.  CBC: Recent Labs  Lab 06/12/24 0900 06/14/24 0840  WBC 6.6 7.2  NEUTROABS  --  3.8  HGB 11.2* 11.0*  HCT 33.0* 34.1*  MCV 92.4 94.7  PLT 263 264    Cardiac Enzymes: No results for input(s): CKTOTAL, CKMB, CKMBINDEX, TROPONINI in the last 168 hours.  BNP: Invalid input(s): POCBNP  CBG: Recent Labs  Lab 06/15/24 1738  GLUCAP 140*    Microbiology: Results for orders placed or performed during the  hospital encounter of 04/07/24  Resp panel by RT-PCR (RSV, Flu A&B, Covid) Anterior Nasal Swab     Status: None   Collection Time: 04/07/24  4:46 PM   Specimen: Anterior Nasal Swab  Result Value Ref Range Status   SARS Coronavirus 2 by RT PCR NEGATIVE NEGATIVE Final    Comment: (NOTE) SARS-CoV-2 target nucleic acids are NOT DETECTED.  The SARS-CoV-2 RNA is generally detectable in upper respiratory specimens during the acute phase of infection. The lowest concentration of SARS-CoV-2 viral copies this assay can detect is 138 copies/mL. A negative result does not preclude SARS-Cov-2 infection and should not be used as the sole basis for treatment or other patient management decisions. A negative result may occur with  improper specimen collection/handling, submission of specimen other than nasopharyngeal swab, presence of viral mutation(s) within the areas targeted by this assay, and inadequate number of viral copies(<138 copies/mL). A negative result must be combined with clinical observations, patient history, and epidemiological information. The expected result is Negative.  Fact Sheet for Patients:  BloggerCourse.com  Fact Sheet for Healthcare Providers:  SeriousBroker.it  This test is no t yet approved or cleared by the [place]  FDA and  has been authorized for detection and/or diagnosis of SARS-CoV-2 by FDA under an Emergency Use Authorization (EUA). This EUA will remain  in effect (  meaning this test can be used) for the duration of the COVID-19 declaration under Section 564(b)(1) of the Act, 21 U.S.C.section 360bbb-3(b)(1), unless the authorization is terminated  or revoked sooner.       Influenza A by PCR NEGATIVE NEGATIVE Final   Influenza B by PCR NEGATIVE NEGATIVE Final    Comment: (NOTE) The Xpert Xpress SARS-CoV-2/FLU/RSV plus assay is intended as an aid in the diagnosis of influenza from Nasopharyngeal swab  specimens and should not be used as a sole basis for treatment. Nasal washings and aspirates are unacceptable for Xpert Xpress SARS-CoV-2/FLU/RSV testing.  Fact Sheet for Patients: BloggerCourse.com  Fact Sheet for Healthcare Providers: SeriousBroker.it  This test is not yet approved or cleared by the [place]  FDA and has been authorized for detection and/or diagnosis of SARS-CoV-2 by FDA under an Emergency Use Authorization (EUA). This EUA will remain in effect (meaning this test can be used) for the duration of the COVID-19 declaration under Section 564(b)(1) of the Act, 21 U.S.C. section 360bbb-3(b)(1), unless the authorization is terminated or revoked.     Resp Syncytial Virus by PCR NEGATIVE NEGATIVE Final    Comment: (NOTE) Fact Sheet for Patients: BloggerCourse.com  Fact Sheet for Healthcare Providers: SeriousBroker.it  This test is not yet approved or cleared by the [place]  FDA and has been authorized for detection and/or diagnosis of SARS-CoV-2 by FDA under an Emergency Use Authorization (EUA). This EUA will remain in effect (meaning this test can be used) for the duration of the COVID-19 declaration under Section 564(b)(1) of the Act, 21 U.S.C. section 360bbb-3(b)(1), unless the authorization is terminated or revoked.  Performed at [facility], [address] [place], [place] [address]     Coagulation Studies: No results for input(s): LABPROT, INR in the last 72 hours.  Urinalysis: No results for input(s): COLORURINE, LABSPEC, PHURINE, GLUCOSEU, HGBUR, BILIRUBINUR, KETONESUR, PROTEINUR, UROBILINOGEN, NITRITE, LEUKOCYTESUR in the last 72 hours.  Invalid input(s): APPERANCEUR    Imaging: No results found.   Medications:     bisacodyl   10 mg Oral QHS   buPROPion   300 mg Oral Daily   calcitRIOL   0.25 mcg  Oral Daily   Chlorhexidine  Gluconate Cloth  6 each Topical Daily   cholecalciferol   1,000 Units Oral Daily   fludrocortisone   0.1 mg Oral Daily   Followed by   NOREEN ON 06/30/2024] fludrocortisone   0.05 mg Oral Daily   heparin   5,000 Units Subcutaneous Q8H   influenza vac split trivalent PF  0.5 mL Intramuscular Tomorrow-1000   polyethylene glycol  17 g Oral BID   sevelamer  carbonate  1,600 mg Oral TID WC   venlafaxine  XR  150 mg Oral Q breakfast   And   venlafaxine  XR  75 mg Oral Q breakfast   acetaminophen , bisacodyl , butalbital -acetaminophen -caffeine , meclizine, ondansetron  (ZOFRAN ) IV, ondansetron   Assessment/ Plan:  Brady Edwards is a 58 y.o.  male  with a PMHx of hypertension, atrial fibrillation, BPH, hyperlipidemia, anxiety, constipation, morbid obesity, and end stage renal disease on hemodialysis.    [facility]- will need to resubmit for chair at discharge.    End stage renal disease on hemodialysis       Appreciate vascular performing permcath exchange on 9/11 and 10/3.  Next treatment scheduled for Tuesday    2.  Anemia of chronic kidney disease. Hgb 11.0, at last check     3.  Secondary hyperparathyroidism. PTH 199 during recent admission. Continue Renvela  1 tabs TID.  Will continue calcitriol  and sevelamer  with meals. Bone minerals acceptable on current prescription.    4. Severe orthostatic hypotension Workup so far has included 2D echo from 04/11/2024 which shows LVEF 65 to 70%, normal LV systolic function, mild concentric LVH, normal diastolic parameters, normal right ventricular systolic function.  A.m. cortisol level normal from 04/24/2024.  TSH normal.  Continue TEDs. Midodrine  has been stopped and undergoing Florinef  taper. Patient has been able to ambulate as tolerated.        LOS: 60 Brady Edwards 10/20/202510:50 AM

## 2024-06-18 NOTE — Plan of Care (Signed)

## 2024-06-18 NOTE — Progress Notes (Signed)
 Contacted [facility] admissions to be advised that pt has not d/c to snf as of yet. Will provide update to [facility] admissions regarding pt's d/c date once known/confirmed. Will assist as needed.   Randine Mungo Dialysis Navigator (coverage) [phone]

## 2024-06-19 DIAGNOSIS — Z7689 Persons encountering health services in other specified circumstances: Secondary | ICD-10-CM | POA: Diagnosis not present

## 2024-06-19 DIAGNOSIS — I951 Orthostatic hypotension: Secondary | ICD-10-CM | POA: Diagnosis not present

## 2024-06-19 LAB — CBC WITH DIFFERENTIAL/PLATELET
Abs Immature Granulocytes: 0.03 K/uL (ref 0.00–0.07)
Basophils Absolute: 0.1 K/uL (ref 0.0–0.1)
Basophils Relative: 1 %
Eosinophils Absolute: 0.3 K/uL (ref 0.0–0.5)
Eosinophils Relative: 4 %
HCT: 31.9 % — ABNORMAL LOW (ref 39.0–52.0)
Hemoglobin: 10.4 g/dL — ABNORMAL LOW (ref 13.0–17.0)
Immature Granulocytes: 0 %
Lymphocytes Relative: 30 %
Lymphs Abs: 2.2 K/uL (ref 0.7–4.0)
MCH: 30.7 pg (ref 26.0–34.0)
MCHC: 32.6 g/dL (ref 30.0–36.0)
MCV: 94.1 fL (ref 80.0–100.0)
Monocytes Absolute: 0.8 K/uL (ref 0.1–1.0)
Monocytes Relative: 10 %
Neutro Abs: 3.9 K/uL (ref 1.7–7.7)
Neutrophils Relative %: 55 %
Platelets: 258 K/uL (ref 150–400)
RBC: 3.39 MIL/uL — ABNORMAL LOW (ref 4.22–5.81)
RDW: 13.1 % (ref 11.5–15.5)
WBC: 7.3 K/uL (ref 4.0–10.5)
nRBC: 0 % (ref 0.0–0.2)

## 2024-06-19 LAB — RENAL FUNCTION PANEL
Albumin: 2.8 g/dL — ABNORMAL LOW (ref 3.5–5.0)
Anion gap: 15 (ref 5–15)
BUN: 20 mg/dL (ref 6–20)
CO2: 21 mmol/L — ABNORMAL LOW (ref 22–32)
Calcium: 8.7 mg/dL — ABNORMAL LOW (ref 8.9–10.3)
Chloride: 98 mmol/L (ref 98–111)
Creatinine, Ser: 7 mg/dL — ABNORMAL HIGH (ref 0.61–1.24)
GFR, Estimated: 8 mL/min — ABNORMAL LOW (ref >60)
Glucose, Bld: 102 mg/dL — ABNORMAL HIGH (ref 70–99)
Phosphorus: 2.3 mg/dL — ABNORMAL LOW (ref 2.5–4.6)
Potassium: 3.5 mmol/L (ref 3.5–5.1)
Sodium: 134 mmol/L — ABNORMAL LOW (ref 135–145)

## 2024-06-19 MED ORDER — HEPARIN SODIUM (PORCINE) 1000 UNIT/ML IJ SOLN
INTRAMUSCULAR | Status: AC
  Filled 2024-06-19: qty 4

## 2024-06-19 MED ORDER — HEPARIN SODIUM (PORCINE) 1000 UNIT/ML IJ SOLN
3200.0000 [IU] | Freq: Once | INTRAMUSCULAR | Status: AC
  Administered 2024-06-19: 3200 [IU]

## 2024-06-19 MED ORDER — SEVELAMER CARBONATE 800 MG PO TABS
800.0000 mg | ORAL_TABLET | Freq: Three times a day (TID) | ORAL | Status: DC
Start: 2024-06-19 — End: 2024-06-19
  Administered 2024-06-19: 800 mg via ORAL
  Filled 2024-06-19 (×2): qty 1

## 2024-06-19 MED ORDER — FLUDROCORTISONE ACETATE 0.1 MG PO TABS: ORAL_TABLET | ORAL | Status: AC

## 2024-06-19 MED ORDER — METOPROLOL SUCCINATE ER 25 MG PO TB24: 12.5000 mg | ORAL_TABLET | Freq: Every day | ORAL | Status: DC

## 2024-06-19 MED ORDER — SEVELAMER CARBONATE 800 MG PO TABS: 800.0000 mg | ORAL_TABLET | Freq: Three times a day (TID) | ORAL | Status: AC

## 2024-06-19 MED ORDER — MECLIZINE HCL 25 MG PO TABS: 25.0000 mg | ORAL_TABLET | Freq: Three times a day (TID) | ORAL | Status: DC | PRN

## 2024-06-19 NOTE — TOC Transition Note (Signed)
 Transition of Care [facility]) - Discharge Note   Patient Details  Name: Brady Edwards MRN: [id number] Date of Birth: February 08, 1966  Transition of Care [facility] CM/SW Contact:  Alvaro Louder, LCSW Phone Number: 06/19/2024, 4:24 PM   Clinical Narrative:   LCSWA confirmed with MD that patient is stable for discharge. LCSWA notified the patient and they are in agreement with discharge. LCSWA confirmed bed is available at [facility]. Transport arranged with [facility] for them to contact [facility] for next available pickup.   Number to call report: [phone], RM F-11   TOC signing off    Final next level of care: Skilled Nursing Facility Barriers to Discharge: No Barriers Identified   Patient Goals and CMS Choice            Discharge Placement              Patient chooses bed at:  ([facility]) Patient to be transferred to facility by: [facility] Name of family member notified: Self Patient and family notified of of transfer: 06/19/24  Discharge Plan and Services Additional resources added to the After Visit Summary for                                       Social Drivers of Health (SDOH) Interventions SDOH Screenings   Food Insecurity: Food Insecurity Present (04/16/2024)  Housing: High Risk (04/16/2024)  Transportation Needs: Unmet Transportation Needs (04/16/2024)  Utilities: At Risk (04/16/2024)  Depression (PHQ2-9): High Risk (02/27/2024)  Tobacco Use: High Risk (05/19/2024)     Readmission Risk Interventions     No data to display

## 2024-06-19 NOTE — Progress Notes (Signed)
   06/19/24 1128  Vitals  Temp 98.1 F (36.7 C)  BP 118/78  MAP (mmHg) 96  Pulse Rate 88  ECG Heart Rate 88  Resp 16  Weight 106.8 kg  Type of Weight Post-Dialysis  Oxygen Therapy  SpO2 100 %  O2 Device Room Air  Patient Activity (if Appropriate) In chair  Pulse Oximetry Type Continuous  Oximetry Probe Site Changed No  During Treatment Monitoring  Blood Flow Rate (mL/min) 199 mL/min  Arterial Pressure (mmHg) -95.75 mmHg  Venous Pressure (mmHg) 94.14 mmHg  TMP (mmHg) 8.28 mmHg  Ultrafiltration Rate (mL/min) 347 mL/min  Dialysate Flow Rate (mL/min) 300 ml/min  Dialysate Potassium Concentration 3  Dialysate Calcium  Concentration 2.5  Duration of HD Treatment -hour(s) 3.5 hour(s)  Cumulative Fluid Removed (mL) per Treatment  500.06  Post Treatment  Dialyzer Clearance Clear  Liters Processed 57.7  Fluid Removed (mL) 500 mL  Tolerated HD Treatment Yes  Post-Hemodialysis Comments goal has been met. Tx has been tolerated. Vs are stable. Patient has no verbalized concerns.

## 2024-06-19 NOTE — Progress Notes (Signed)
 D/C order noted. Pt will be d/c to [facility], pt will receive HD treatment there. Requested documents faxed to SNF for continuation of care.  Suzen Satchel Dialysis Navigator [phone]

## 2024-06-19 NOTE — Progress Notes (Signed)
 [facility]  ROUNDING NOTE   Subjective:   Patient seen and evaluated during dialysis   HEMODIALYSIS FLOWSHEET:  Blood Flow Rate (mL/min): 250 mL/min Arterial Pressure (mmHg): -106.69 mmHg Venous Pressure (mmHg): 95.56 mmHg TMP (mmHg): 16.56 mmHg Ultrafiltration Rate (mL/min): 400 mL/min Dialysate Flow Rate (mL/min): 300 ml/min Dialysis Fluid Bolus: Normal Saline Bolus Amount (mL): 200 mL  Sitting in chair No complaints to offer  Objective:  Vital signs in last 24 hours:  Temp:  [98 F (36.7 C)-99.1 F (37.3 C)] 98.4 F (36.9 C) (10/21 0750) Pulse Rate:  [80-106] 81 (10/21 0900) Resp:  [14-19] 18 (10/21 0900) BP: (101-133)/(59-97) 120/59 (10/21 0900) SpO2:  [95 %-100 %] 100 % (10/21 0900) Weight:  [107 kg] 107 kg (10/21 0750)  Weight change:  Filed Weights   06/16/24 1447 06/19/24 0749 06/19/24 0750  Weight: 108.3 kg 107 kg 107 kg    Intake/Output: I/O last 3 completed shifts: In: -  Out: 400 [Urine:400]   Intake/Output this shift:  No intake/output data recorded.  Physical Exam: General: NAD  Head: Normocephalic  Eyes: Anicteric  Lungs:  Clear, room air  Heart: Regular rate and rhythm  Abdomen:  Soft, nontender, bowel sounds heard  Extremities:  no peripheral edema.  Neurologic: Alert and awake  Skin: Warm, intact  Access: Right Permcath    Basic Metabolic Panel: Recent Labs  Lab 06/14/24 0840 06/16/24 0451 06/17/24 0441  NA 132* 134* 132*  K 3.2* 3.3* 3.7  CL 95* 96* 93*  CO2 22 27 26   GLUCOSE 135* 95 82  BUN 16 9 6   CREATININE 8.01* 6.52* 4.72*  CALCIUM  9.1 8.9 9.0  PHOS 3.4  --   --     Liver Function Tests: Recent Labs  Lab 06/14/24 0840  ALBUMIN  3.4*   No results for input(s): LIPASE, AMYLASE in the last 168 hours. No results for input(s): AMMONIA in the last 168 hours.  CBC: Recent Labs  Lab 06/14/24 0840 06/19/24 0841  WBC 7.2 7.3  NEUTROABS 3.8 3.9  HGB 11.0* 10.4*  HCT 34.1* 31.9*  MCV 94.7  94.1  PLT 264 258    Cardiac Enzymes: No results for input(s): CKTOTAL, CKMB, CKMBINDEX, TROPONINI in the last 168 hours.  BNP: Invalid input(s): POCBNP  CBG: Recent Labs  Lab 06/15/24 1738  GLUCAP 140*    Microbiology: Results for orders placed or performed during the hospital encounter of 04/07/24  Resp panel by RT-PCR (RSV, Flu A&B, Covid) Anterior Nasal Swab     Status: None   Collection Time: 04/07/24  4:46 PM   Specimen: Anterior Nasal Swab  Result Value Ref Range Status   SARS Coronavirus 2 by RT PCR NEGATIVE NEGATIVE Final    Comment: (NOTE) SARS-CoV-2 target nucleic acids are NOT DETECTED.  The SARS-CoV-2 RNA is generally detectable in upper respiratory specimens during the acute phase of infection. The lowest concentration of SARS-CoV-2 viral copies this assay can detect is 138 copies/mL. A negative result does not preclude SARS-Cov-2 infection and should not be used as the sole basis for treatment or other patient management decisions. A negative result may occur with  improper specimen collection/handling, submission of specimen other than nasopharyngeal swab, presence of viral mutation(s) within the areas targeted by this assay, and inadequate number of viral copies(<138 copies/mL). A negative result must be combined with clinical observations, patient history, and epidemiological information. The expected result is Negative.  Fact Sheet for Patients:  BloggerCourse.com  Fact Sheet for Healthcare Providers:  SeriousBroker.it  This test is no t yet approved or cleared by the [place]  FDA and  has been authorized for detection and/or diagnosis of SARS-CoV-2 by FDA under an Emergency Use Authorization (EUA). This EUA will remain  in effect (meaning this test can be used) for the duration of the COVID-19 declaration under Section 564(b)(1) of the Act, 21 U.S.C.section 360bbb-3(b)(1), unless the  authorization is terminated  or revoked sooner.       Influenza A by PCR NEGATIVE NEGATIVE Final   Influenza B by PCR NEGATIVE NEGATIVE Final    Comment: (NOTE) The Xpert Xpress SARS-CoV-2/FLU/RSV plus assay is intended as an aid in the diagnosis of influenza from Nasopharyngeal swab specimens and should not be used as a sole basis for treatment. Nasal washings and aspirates are unacceptable for Xpert Xpress SARS-CoV-2/FLU/RSV testing.  Fact Sheet for Patients: BloggerCourse.com  Fact Sheet for Healthcare Providers: SeriousBroker.it  This test is not yet approved or cleared by the [place]  FDA and has been authorized for detection and/or diagnosis of SARS-CoV-2 by FDA under an Emergency Use Authorization (EUA). This EUA will remain in effect (meaning this test can be used) for the duration of the COVID-19 declaration under Section 564(b)(1) of the Act, 21 U.S.C. section 360bbb-3(b)(1), unless the authorization is terminated or revoked.     Resp Syncytial Virus by PCR NEGATIVE NEGATIVE Final    Comment: (NOTE) Fact Sheet for Patients: BloggerCourse.com  Fact Sheet for Healthcare Providers: SeriousBroker.it  This test is not yet approved or cleared by the [place]  FDA and has been authorized for detection and/or diagnosis of SARS-CoV-2 by FDA under an Emergency Use Authorization (EUA). This EUA will remain in effect (meaning this test can be used) for the duration of the COVID-19 declaration under Section 564(b)(1) of the Act, 21 U.S.C. section 360bbb-3(b)(1), unless the authorization is terminated or revoked.  Performed at [facility], [address] [place], [place] [address]     Coagulation Studies: No results for input(s): LABPROT, INR in the last 72 hours.  Urinalysis: No results for input(s): COLORURINE, LABSPEC, PHURINE,  GLUCOSEU, HGBUR, BILIRUBINUR, KETONESUR, PROTEINUR, UROBILINOGEN, NITRITE, LEUKOCYTESUR in the last 72 hours.  Invalid input(s): APPERANCEUR    Imaging: No results found.   Medications:     bisacodyl   10 mg Oral QHS   buPROPion   300 mg Oral Daily   calcitRIOL   0.25 mcg Oral Daily   Chlorhexidine  Gluconate Cloth  6 each Topical Daily   cholecalciferol   1,000 Units Oral Daily   fludrocortisone   0.1 mg Oral Daily   Followed by   NOREEN ON 06/30/2024] fludrocortisone   0.05 mg Oral Daily   heparin   5,000 Units Subcutaneous Q8H   influenza vac split trivalent PF  0.5 mL Intramuscular Tomorrow-1000   metoprolol  succinate  12.5 mg Oral Daily   polyethylene glycol  17 g Oral BID   sevelamer  carbonate  1,600 mg Oral TID WC   venlafaxine  XR  150 mg Oral Q breakfast   And   venlafaxine  XR  75 mg Oral Q breakfast   acetaminophen , bisacodyl , butalbital -acetaminophen -caffeine , meclizine, ondansetron  (ZOFRAN ) IV, ondansetron   Assessment/ Plan:  Brady Edwards is a 58 y.o.  male  with a PMHx of hypertension, atrial fibrillation, BPH, hyperlipidemia, anxiety, constipation, morbid obesity, and end stage renal disease on hemodialysis.    [facility]- will need to resubmit for chair at discharge.    End stage renal disease on hemodialysis  Appreciate vascular performing permcath exchange on 9/11 and 10/3.   Receiving dialysis today, low UF. Next treatment scheduled for Thursday.     2.  Anemia of chronic kidney disease. Hgb 10.4, no need for ESA.    3.  Secondary hyperparathyroidism. PTH 199 during recent admission. Continue Renvela  1 tabs TID.           Will continue calcitriol  and sevelamer  with meals. Phos decreased, will decrease sevelamer  dosing.    4. Severe orthostatic hypotension Workup so far has included 2D echo from 04/11/2024 which shows LVEF 65 to 70%, normal LV systolic function, mild concentric LVH, normal diastolic parameters, normal right  ventricular systolic function.  A.m. cortisol level normal from 04/24/2024.  TSH normal.  Continue TEDs. Midodrine  has been stopped and undergoing Florinef  taper. Patient has been able to ambulate as tolerated.        LOS: 61 Madelein Mahadeo 10/21/20259:15 AM

## 2024-06-19 NOTE — Discharge Summary (Signed)
 Physician Discharge Summary   [name] MRN: [id number] DOB: [birth year]  Admit date:     04/15/2024  Discharge date: 06/19/24  Discharge Physician: [name]   PCP: Vicci Duwaine SQUIBB, DO   Recommendations at discharge:   Follow up with SNF provider - BMP x 1 week Monitor BP, HR. Can uptitrate metoprolol  as tolerated  Nephrology, for ongoing HD - TTS  Discharge Diagnoses: Principal Problem:   Orthostatic hypotension Active Problems:   General weakness   ESRD (end stage renal disease) (HCC)   Anemia of chronic disease   Depression   Clotted dialysis access   Obesity (BMI 30-39.9)   History of atrial fibrillation   Vitamin D  deficiency   Hospital Course: Brady Edwards is a 58 y.o. male with a PMH significant for newly ESRD recently started on HD, atrial fibrillation, anxiety and depression, morbid obesity who presented with complaints of weakness and falls. Of note, pt was hospitalized from 04/07/24 to 04/13/24 during which he was started on dialysis, and was discharged after dialysis on 8/15 to continue outpatient dialysis TTS. Hospital course as below   Symptomatic Orthostatic hypotension- patient has not been getting midodrine  due to his hypertension at rest, discontinued - continue on florinef  daily with very slow taper with optimism that his symptoms will improve with ongoing physical therapy and adjustments to HD - Continue TED hose, abdominal binder, lifestyle changes - continue PT/OT, mobilizing patient as much as possible   ESRD (end stage renal disease) (HCC) TThS Clotted dialysis access Anemia of end-stage renal disease. PermCath exchange by vascular surgery on 9/11 and 10/3.  Hemodialysis as per nephrology Calcitriol , Sevelamer  dose decreased   Mild hypokalemia - resolved   Depression On Wellbutrin  and Effexor    Vitamin D  deficiency Supplement vitamin D    Paroxysmal atrial fibrillation Not on anticoagulation. Resumed Metoprolol   12.5 XL daily, uptitrate as needed   HTN Hold Lisinopril  On low dose metoprolol    BPPV Meclizine prn   Obesity (BMI 30-39.9) class II. BMI 35.80   Consultants: Nephrology Disposition: Skilled nursing facility Diet recommendation:  Discharge Diet Orders (From admission, onward)     Start     Ordered   06/19/24 0000  Diet - low sodium heart healthy        06/19/24 1353           Renal diet DISCHARGE MEDICATION: Allergies as of 06/19/2024   No Known Allergies      Medication List     STOP taking these medications    calcium  carbonate 1500 (600 Ca) MG Tabs tablet Commonly known as: OSCAL   lisinopril  10 MG tablet Commonly known as: ZESTRIL        TAKE these medications    b complex-vitamin c-folic acid  0.8 MG Tabs tablet Take 1 tablet by mouth at bedtime.   buPROPion  300 MG 24 hr tablet Commonly known as: WELLBUTRIN  XL Take 1 tablet (300 mg total) by mouth daily.   calcitRIOL  0.25 MCG capsule Commonly known as: ROCALTROL  Take 1 capsule (0.25 mcg total) by mouth daily.   feeding supplement (NEPRO CARB STEADY) Liqd Take 237 mLs by mouth 3 (three) times daily between meals.   fludrocortisone  0.1 MG tablet Commonly known as: FLORINEF  Take 1 tablet (0.1 mg total) by mouth daily for 10 days, THEN 0.5 tablets (0.05 mg total) daily for 14 days. Start taking on: June 19, 2024   meclizine 25 MG tablet Commonly known as: ANTIVERT Take 1 tablet (25 mg total) by mouth  3 (three) times daily as needed for dizziness.   metoprolol  succinate 25 MG 24 hr tablet Commonly known as: TOPROL -XL Take 0.5 tablets (12.5 mg total) by mouth daily. Start taking on: June 20, 2024 What changed:  medication strength how much to take additional instructions   sevelamer  carbonate 800 MG tablet Commonly known as: RENVELA  Take 1 tablet (800 mg total) by mouth 3 (three) times daily with meals.   venlafaxine  XR 150 MG 24 hr capsule Commonly known as: EFFEXOR -XR Take  1 capsule (150 mg total) by mouth daily with breakfast. To be taken with 75mg  for 225mg  daily total   venlafaxine  XR 75 MG 24 hr capsule Commonly known as: EFFEXOR -XR To be taken with 150mg  for 225mg  daily total.   vitamin D3 25 MCG tablet Commonly known as: CHOLECALCIFEROL  Take 1 tablet (1,000 Units total) by mouth daily.        Discharge Exam: Filed Weights   06/19/24 0749 06/19/24 0750 06/19/24 1128  Weight: 107 kg 107 kg 106.8 kg   General: NAD Respiratory: normal respiratory effort. Cardiovascular: extremities well perfused Nervous: A&O x3. no gross focal neurologic deficits, normal speech Extremities: moves all equally, no edema, normal tone Skin: dry, intact, normal temperature, normal color  Condition at discharge: good  The results of significant diagnostics from this hospitalization (including imaging, microbiology, ancillary and laboratory) are listed below for reference.   Imaging Studies: PERIPHERAL VASCULAR CATHETERIZATION Result Date: 06/01/2024 See surgical note for result.   Microbiology: Results for orders placed or performed during the hospital encounter of 04/07/24  Resp panel by RT-PCR (RSV, Flu A&B, Covid) Anterior Nasal Swab     Status: None   Collection Time: 04/07/24  4:46 PM   Specimen: Anterior Nasal Swab  Result Value Ref Range Status   SARS Coronavirus 2 by RT PCR NEGATIVE NEGATIVE Final    Comment: (NOTE) SARS-CoV-2 target nucleic acids are NOT DETECTED.  The SARS-CoV-2 RNA is generally detectable in upper respiratory specimens during the acute phase of infection. The lowest concentration of SARS-CoV-2 viral copies this assay can detect is 138 copies/mL. A negative result does not preclude SARS-Cov-2 infection and should not be used as the sole basis for treatment or other patient management decisions. A negative result may occur with  improper specimen collection/handling, submission of specimen other than nasopharyngeal swab,  presence of viral mutation(s) within the areas targeted by this assay, and inadequate number of viral copies(<138 copies/mL). A negative result must be combined with clinical observations, patient history, and epidemiological information. The expected result is Negative.  Fact Sheet for Patients:  BloggerCourse.com  Fact Sheet for Healthcare Providers:  SeriousBroker.it  This test is no t yet approved or cleared by the [place]  FDA and  has been authorized for detection and/or diagnosis of SARS-CoV-2 by FDA under an Emergency Use Authorization (EUA). This EUA will remain  in effect (meaning this test can be used) for the duration of the COVID-19 declaration under Section 564(b)(1) of the Act, 21 U.S.C.section 360bbb-3(b)(1), unless the authorization is terminated  or revoked sooner.       Influenza A by PCR NEGATIVE NEGATIVE Final   Influenza B by PCR NEGATIVE NEGATIVE Final    Comment: (NOTE) The Xpert Xpress SARS-CoV-2/FLU/RSV plus assay is intended as an aid in the diagnosis of influenza from Nasopharyngeal swab specimens and should not be used as a sole basis for treatment. Nasal washings and aspirates are unacceptable for Xpert Xpress SARS-CoV-2/FLU/RSV testing.  Fact Sheet for Patients: BloggerCourse.com  Fact Sheet for Healthcare Providers: SeriousBroker.it  This test is not yet approved or cleared by the [place]  FDA and has been authorized for detection and/or diagnosis of SARS-CoV-2 by FDA under an Emergency Use Authorization (EUA). This EUA will remain in effect (meaning this test can be used) for the duration of the COVID-19 declaration under Section 564(b)(1) of the Act, 21 U.S.C. section 360bbb-3(b)(1), unless the authorization is terminated or revoked.     Resp Syncytial Virus by PCR NEGATIVE NEGATIVE Final    Comment: (NOTE) Fact Sheet for  Patients: BloggerCourse.com  Fact Sheet for Healthcare Providers: SeriousBroker.it  This test is not yet approved or cleared by the [place]  FDA and has been authorized for detection and/or diagnosis of SARS-CoV-2 by FDA under an Emergency Use Authorization (EUA). This EUA will remain in effect (meaning this test can be used) for the duration of the COVID-19 declaration under Section 564(b)(1) of the Act, 21 U.S.C. section 360bbb-3(b)(1), unless the authorization is terminated or revoked.  Performed at [facility], [address] [place], [place] [address]     Labs: CBC: Recent Labs  Lab 06/14/24 0840 06/19/24 0841  WBC 7.2 7.3  NEUTROABS 3.8 3.9  HGB 11.0* 10.4*  HCT 34.1* 31.9*  MCV 94.7 94.1  PLT 264 258   Basic Metabolic Panel: Recent Labs  Lab 06/14/24 0840 06/16/24 0451 06/17/24 0441 06/19/24 0841  NA 132* 134* 132* 134*  K 3.2* 3.3* 3.7 3.5  CL 95* 96* 93* 98  CO2 22 27 26  21*  GLUCOSE 135* 95 82 102*  BUN 16 9 6 20   CREATININE 8.01* 6.52* 4.72* 7.00*  CALCIUM  9.1 8.9 9.0 8.7*  PHOS 3.4  --   --  2.3*   Liver Function Tests: Recent Labs  Lab 06/14/24 0840 06/19/24 0841  ALBUMIN  3.4* 2.8*   CBG: Recent Labs  Lab 06/15/24 1738  GLUCAP 140*    Discharge time spent: greater than 30 minutes.  Signed: [name], MD [facility] 06/19/2024

## 2024-06-19 NOTE — Progress Notes (Addendum)
 1425 Report given to Engineer, mining at [employer]. All opportunities for questions answered and clarified. Pt will be transported to [facility].

## 2024-06-19 NOTE — Plan of Care (Signed)
Pt cooperative with staff. 

## 2024-06-29 DIAGNOSIS — N186 End stage renal disease: Secondary | ICD-10-CM | POA: Diagnosis not present

## 2024-06-29 DIAGNOSIS — Z992 Dependence on renal dialysis: Secondary | ICD-10-CM | POA: Diagnosis not present

## 2024-07-29 DIAGNOSIS — Z992 Dependence on renal dialysis: Secondary | ICD-10-CM | POA: Diagnosis not present

## 2024-07-29 DIAGNOSIS — N186 End stage renal disease: Secondary | ICD-10-CM | POA: Diagnosis not present

## 2024-08-17 ENCOUNTER — Inpatient Hospital Stay: Admitting: Family Medicine

## 2024-08-21 ENCOUNTER — Ambulatory Visit: Admitting: Family Medicine

## 2024-08-21 ENCOUNTER — Encounter: Payer: Self-pay | Admitting: Family Medicine

## 2024-08-21 VITALS — BP 129/79 | HR 87 | Temp 97.5°F | Ht 70.0 in | Wt 283.4 lb

## 2024-08-21 DIAGNOSIS — I951 Orthostatic hypotension: Secondary | ICD-10-CM | POA: Diagnosis not present

## 2024-08-21 DIAGNOSIS — Z59819 Housing instability, housed unspecified: Secondary | ICD-10-CM

## 2024-08-21 DIAGNOSIS — K029 Dental caries, unspecified: Secondary | ICD-10-CM

## 2024-08-21 DIAGNOSIS — I482 Chronic atrial fibrillation, unspecified: Secondary | ICD-10-CM | POA: Diagnosis not present

## 2024-08-21 DIAGNOSIS — N401 Enlarged prostate with lower urinary tract symptoms: Secondary | ICD-10-CM

## 2024-08-21 DIAGNOSIS — F4001 Agoraphobia with panic disorder: Secondary | ICD-10-CM

## 2024-08-21 DIAGNOSIS — E782 Mixed hyperlipidemia: Secondary | ICD-10-CM | POA: Diagnosis not present

## 2024-08-21 DIAGNOSIS — Z1159 Encounter for screening for other viral diseases: Secondary | ICD-10-CM | POA: Diagnosis not present

## 2024-08-21 DIAGNOSIS — R631 Polydipsia: Secondary | ICD-10-CM | POA: Diagnosis not present

## 2024-08-21 DIAGNOSIS — F5089 Other specified eating disorder: Secondary | ICD-10-CM | POA: Diagnosis not present

## 2024-08-21 DIAGNOSIS — Z789 Other specified health status: Secondary | ICD-10-CM

## 2024-08-21 DIAGNOSIS — N186 End stage renal disease: Secondary | ICD-10-CM | POA: Diagnosis not present

## 2024-08-21 DIAGNOSIS — E559 Vitamin D deficiency, unspecified: Secondary | ICD-10-CM

## 2024-08-21 LAB — BAYER DCA HB A1C WAIVED: HB A1C (BAYER DCA - WAIVED): 5.2 % (ref 4.8–5.6)

## 2024-08-21 MED ORDER — BUPROPION HCL ER (XL) 300 MG PO TB24: 300.0000 mg | ORAL_TABLET | Freq: Every day | ORAL | 1 refills | Status: AC

## 2024-08-21 MED ORDER — VENLAFAXINE HCL ER 75 MG PO CP24: ORAL_CAPSULE | ORAL | 1 refills | Status: AC

## 2024-08-21 MED ORDER — VENLAFAXINE HCL ER 150 MG PO CP24: 150.0000 mg | ORAL_CAPSULE | Freq: Every day | ORAL | 1 refills | Status: AC

## 2024-08-21 NOTE — Assessment & Plan Note (Signed)
 Rechecking labs today. Await results.

## 2024-08-21 NOTE — Assessment & Plan Note (Signed)
 Rechecking labs today. Await results. Treat as needed.

## 2024-08-21 NOTE — Assessment & Plan Note (Signed)
 Under good control on current regimen. Continue current regimen. Continue to monitor. Call with any concerns. Refills given.

## 2024-08-21 NOTE — Assessment & Plan Note (Signed)
Doing better. Continue to monitor. Call with any concerns.  

## 2024-08-21 NOTE — Assessment & Plan Note (Signed)
 Continue to follow with nephrology. Continue dialysis. Labs drawn today.

## 2024-08-21 NOTE — Progress Notes (Signed)
 error

## 2024-08-21 NOTE — Progress Notes (Signed)
 "  BP 129/79   Pulse 87   Temp (!) 97.5 F (36.4 C) (Oral)   Ht 5' 10 (1.778 m)   Wt 283 lb 6.4 oz (128.5 kg)   SpO2 96%   BMI 40.66 kg/m    Subjective:    Patient ID: Brady Edwards, male    DOB: [birth year], 58 y.o.   MRN: [id number]  HPI: Brady Edwards is a 58 y.o. male  Chief Complaint  Patient presents with   Hospitalization Follow-up    Patient states he was hospitalized back in August, stayed there for 4 months. States he then went to rehab. States he was mainly there for his Orthostatic hypotension.    Transition of Care Hospital Follow up.   Hospital/Facility: [facility] D/C Physician: Shruithi Ponnala D/C Date: 06/19/24 from [facility] 08/03/24 from [facility]  Records Requested: 08/21/24 Records Received: 08/21/24 Records Reviewed: 08/21/24  Diagnoses on Discharge:   Orthostatic hypotension   General weakness   ESRD (end stage renal disease) (HCC)   Anemia of chronic disease   Depression   Clotted dialysis access   Obesity (BMI 30-39.9)   History of atrial fibrillation   Vitamin D  deficiency  Date of interactive Contact within 48 hours of discharge: 06/19/24 Contact was through: direct  Date of 7 day or 14 day face-to-face visit:  08/21/24  NOT within 14 days  Outpatient Encounter Medications as of 08/21/2024  Medication Sig   b complex vitamins capsule Take 1 capsule by mouth daily.   cholecalciferol  (CHOLECALCIFEROL ) 25 MCG tablet Take 1 tablet (1,000 Units total) by mouth daily.   metoprolol  succinate (TOPROL -XL) 25 MG 24 hr tablet Take 0.5 tablets (12.5 mg total) by mouth daily.   sevelamer  carbonate (RENVELA ) 800 MG tablet Take 1 tablet (800 mg total) by mouth 3 (three) times daily with meals.   [DISCONTINUED] buPROPion  (WELLBUTRIN  XL) 300 MG 24 hr tablet Take 1 tablet (300 mg total) by mouth daily.   [DISCONTINUED] venlafaxine  XR (EFFEXOR -XR) 150 MG 24 hr capsule Take 1 capsule (150 mg total) by mouth daily with breakfast. To be taken with  75mg  for 225mg  daily total   [DISCONTINUED] venlafaxine  XR (EFFEXOR -XR) 75 MG 24 hr capsule To be taken with 150mg  for 225mg  daily total.   buPROPion  (WELLBUTRIN  XL) 300 MG 24 hr tablet Take 1 tablet (300 mg total) by mouth daily.   venlafaxine  XR (EFFEXOR -XR) 150 MG 24 hr capsule Take 1 capsule (150 mg total) by mouth daily with breakfast. To be taken with 75mg  for 225mg  daily total   venlafaxine  XR (EFFEXOR -XR) 75 MG 24 hr capsule To be taken with 150mg  for 225mg  daily total.   [DISCONTINUED] calcitRIOL  (ROCALTROL ) 0.25 MCG capsule Take 1 capsule (0.25 mcg total) by mouth daily. (Patient not taking: Reported on 08/21/2024)   [DISCONTINUED] meclizine  (ANTIVERT ) 25 MG tablet Take 1 tablet (25 mg total) by mouth 3 (three) times daily as needed for dizziness. (Patient not taking: Reported on 08/21/2024)   [DISCONTINUED] multivitamin (RENA-VIT) TABS tablet Take 1 tablet by mouth at bedtime. (Patient not taking: Reported on 08/21/2024)   [DISCONTINUED] Nutritional Supplements (FEEDING SUPPLEMENT, NEPRO CARB STEADY,) LIQD Take 237 mLs by mouth 3 (three) times daily between meals. (Patient not taking: Reported on 08/21/2024)   No facility-administered encounter medications on file as of 08/21/2024.  Per Hospitalist: Hospital Course: Brady Edwards is a 59 y.o. male with a PMH significant for newly ESRD recently started on HD, atrial fibrillation, anxiety and depression, morbid obesity  who presented with complaints of weakness and falls. Of note, pt was hospitalized from 04/07/24 to 04/13/24 during which he was started on dialysis, and was discharged after dialysis on 8/15 to continue outpatient dialysis TTS. Hospital course as below   Symptomatic Orthostatic hypotension- patient has not been getting midodrine  due to his hypertension at rest, discontinued - continue on florinef  daily with very slow taper with optimism that his symptoms will improve with ongoing physical therapy and adjustments to HD -  Continue TED hose, abdominal binder, lifestyle changes - continue PT/OT, mobilizing patient as much as possible   ESRD (end stage renal disease) (HCC) TThS Clotted dialysis access Anemia of end-stage renal disease. PermCath exchange by vascular surgery on 9/11 and 10/3.  Hemodialysis as per nephrology Calcitriol , Sevelamer  dose decreased   Mild hypokalemia - resolved   Depression On Wellbutrin  and Effexor    Vitamin D  deficiency Supplement vitamin D    Paroxysmal atrial fibrillation Not on anticoagulation. Resumed Metoprolol  12.5 XL daily, uptitrate as needed   HTN Hold Lisinopril  On low dose metoprolol    BPPV Meclizine  prn   Obesity (BMI 30-39.9) class II. BMI 35.80  Diagnostic Tests Reviewed:  CT ABDOMEN AND PELVIS WITHOUT CONTRAST 04/08/2024 08:38:50 AM   TECHNIQUE: CT of the abdomen and pelvis was performed without the administration of intravenous contrast. Multiplanar reformatted images are provided for review. Automated exposure control, iterative reconstruction, and/or weight based adjustment of the mA/kV was utilized to reduce the radiation dose to as low as reasonably achievable.   COMPARISON: Renal ultrasound 04/07/2024.   CLINICAL HISTORY: Bowel obstruction suspected. Principal Problem: Renal failure (ARF), acute on chronic (HCC).   FINDINGS:   LOWER CHEST: No acute abnormality.   LIVER: The liver is unremarkable.   GALLBLADDER AND BILE DUCTS: Gallbladder is unremarkable. No biliary ductal dilatation.   SPLEEN: No acute abnormality.   PANCREAS: No acute abnormality.   ADRENAL GLANDS: No acute abnormality.   KIDNEYS, URETERS AND BLADDER: Simple cysts are again noted in both kidneys measuring up to 3.3 cm at the lower pole of the right kidney and 2.1 cm at the lower pole of the left kidney. Per consensus, no follow-up is needed for simple Bosniak type 1 and 2 renal cysts, unless the patient has a malignancy history or risk factors. No  stones in the kidneys or ureters. No hydronephrosis. No perinephric or periureteral stranding. Urinary bladder is unremarkable.   GI AND BOWEL: Stomach demonstrates no acute abnormality. There is no bowel obstruction. No bowel wall thickening.   PERITONEUM AND RETROPERITONEUM: No ascites. No free air.   VASCULATURE: Minimal atherosclerotic calcifications are present in the aorta. No aneurysm is present.   LYMPH NODES: No lymphadenopathy.   REPRODUCTIVE ORGANS: No acute abnormality.   BONES AND SOFT TISSUES: Increased density along the inferior aspect of the L2 vertebral body suggests a subacute fracture. MRI of the lumbar spine would be useful for further evaluation. Fat herniates into the right inguinal canal without associated bowel.   IMPRESSION: 1. No CT evidence of bowel obstruction. 2. Subacute fracture of the L2 vertebral body. Recommend MRI of the lumbar spine for further evaluation.  CLINICAL DATA:  Clemens from standing weakness   EXAM: CT HEAD WITHOUT CONTRAST   TECHNIQUE: Contiguous axial images were obtained from the base of the skull through the vertex without intravenous contrast.   RADIATION DOSE REDUCTION: This exam was performed according to the departmental dose-optimization program which includes automated exposure control, adjustment of the mA and/or kV according to  patient size and/or use of iterative reconstruction technique.   COMPARISON:  None Available.   FINDINGS: Brain: No acute territorial infarction, hemorrhage or intracranial mass. The ventricles are nonenlarged.   Vascular: No hyperdense vessels.  No unexpected calcification   Skull: Normal. Negative for fracture or focal lesion.   Sinuses/Orbits: No acute finding.   Other: None   IMPRESSION: Negative non contrasted CT appearance of the brain.   Disposition: SNF now home  Consults: Nephrology  Discharge Instructions: follow up here, continue dialysis  Disease/illness  Education: Discussed today  Home Health/Community Services Discussions/Referrals: In place  Establishment or re-establishment of referral orders for community resources: In place   Discussion with other health care providers: With LCSW  Assessment and Support of treatment regimen adherence:  Fair  Appointments Coordinated with: Patient  Education for self-management, independent living, and ADLs: Discussed today  Has been feeling well physically. Hasn't had too many low blood pressures. Not taking any medicine. Dialysis is going well. Following with nephrology. He has been under a huge amount of stress. When he got out of the SNF he moved in with an ex girlfriend and her current boyfriend and is paying rent, but he is not getting along with them and is very concerned that he is going to be kicked out and be homeless. He has been approved for disability, but he has not been found out how much he's going to get, which makes getting housing more difficult.   DEPRESSION Duration: chronic Mood status: stable Satisfied with current treatment?: yes Symptom severity: severe  Duration of current treatment : chronic Side effects: no Medication compliance: excellent compliance Psychotherapy/counseling: no  Previous psychiatric medications: wellbutrin , effexor  Depressed mood: yes Anxious mood: yes Anhedonia: no Significant weight loss or gain: no Insomnia: no  Fatigue: yes Feelings of worthlessness or guilt: yes Impaired concentration/indecisiveness: yes Suicidal ideations: yes Hopelessness: no Crying spells: no    08/21/2024    9:02 AM 02/27/2024    3:26 PM 01/31/2024   11:25 AM 12/27/2022   10:55 AM 04/14/2022    9:41 AM  Depression screen PHQ 2/9  Decreased Interest 2 3 3 1 2   Down, Depressed, Hopeless 2 2 2 1 2   PHQ - 2 Score 4 5 5 2 4   Altered sleeping 1 2 1 1  0  Tired, decreased energy 3 2 3 2 1   Change in appetite 2 1 1 1 1   Feeling bad or failure about yourself  3 2 2 1 1    Trouble concentrating 1 1 0 1 1  Moving slowly or fidgety/restless 0 0 0 0 0  Suicidal thoughts 1 1 1  0 0  PHQ-9 Score 15 14  13  8  8    Difficult doing work/chores Somewhat difficult Very difficult  Somewhat difficult Somewhat difficult     Data saved with a previous flowsheet row definition     Relevant past medical, surgical, family and social history reviewed and updated as indicated. Interim medical history since our last visit reviewed. Allergies and medications reviewed and updated.  Review of Systems  Constitutional: Negative.   Respiratory: Negative.    Cardiovascular: Negative.   Gastrointestinal:  Positive for constipation and diarrhea. Negative for abdominal distention, abdominal pain, anal bleeding, blood in stool, nausea, rectal pain and vomiting.  Genitourinary: Negative.   Musculoskeletal: Negative.   Neurological: Negative.   Psychiatric/Behavioral: Negative.      Per HPI unless specifically indicated above     Objective:    BP 129/79  Pulse 87   Temp (!) 97.5 F (36.4 C) (Oral)   Ht 5' 10 (1.778 m)   Wt 283 lb 6.4 oz (128.5 kg)   SpO2 96%   BMI 40.66 kg/m   Wt Readings from Last 3 Encounters:  08/21/24 283 lb 6.4 oz (128.5 kg)  06/19/24 235 lb 7.2 oz (106.8 kg)  04/13/24 281 lb 4.9 oz (127.6 kg)    Physical Exam Vitals and nursing note reviewed.  Constitutional:      General: He is not in acute distress.    Appearance: Normal appearance. He is obese. He is not ill-appearing, toxic-appearing or diaphoretic.  HENT:     Head: Normocephalic and atraumatic.     Right Ear: External ear normal.     Left Ear: External ear normal.     Nose: Nose normal.     Mouth/Throat:     Mouth: Mucous membranes are moist.     Pharynx: Oropharynx is clear.  Eyes:     General: No scleral icterus.       Right eye: No discharge.        Left eye: No discharge.     Extraocular Movements: Extraocular movements intact.     Conjunctiva/sclera: Conjunctivae normal.      Pupils: Pupils are equal, round, and reactive to light.  Cardiovascular:     Rate and Rhythm: Normal rate and regular rhythm.     Pulses: Normal pulses.     Heart sounds: Normal heart sounds. No murmur heard.    No friction rub. No gallop.  Pulmonary:     Effort: Pulmonary effort is normal. No respiratory distress.     Breath sounds: Normal breath sounds. No stridor. No wheezing, rhonchi or rales.  Chest:     Chest wall: No tenderness.  Musculoskeletal:        General: Normal range of motion.     Cervical back: Normal range of motion and neck supple.  Skin:    General: Skin is warm and dry.     Capillary Refill: Capillary refill takes less than 2 seconds.     Coloration: Skin is not jaundiced or pale.     Findings: No bruising, erythema, lesion or rash.  Neurological:     General: No focal deficit present.     Mental Status: He is alert and oriented to person, place, and time. Mental status is at baseline.  Psychiatric:        Mood and Affect: Mood normal.        Behavior: Behavior normal.        Thought Content: Thought content normal.        Judgment: Judgment normal.     Results for orders placed or performed in visit on 08/21/24  Bayer DCA Hb A1c Waived   Collection Time: 08/21/24  9:34 AM  Result Value Ref Range   HB A1C (BAYER DCA - WAIVED) 5.2 4.8 - 5.6 %  Comprehensive metabolic panel with GFR   Collection Time: 08/21/24  9:35 AM  Result Value Ref Range   Glucose 98 70 - 99 mg/dL   BUN 42 (H) 6 - 24 mg/dL   Creatinine, Ser 3.34 (H) 0.76 - 1.27 mg/dL   eGFR 9 (L) >40 fO/fpw/8.26   BUN/Creatinine Ratio 6 (L) 9 - 20   Sodium 142 134 - 144 mmol/L   Potassium 4.4 3.5 - 5.2 mmol/L   Chloride 104 96 - 106 mmol/L   CO2 23 20 - 29 mmol/L  Calcium  8.5 (L) 8.7 - 10.2 mg/dL   Total Protein 6.0 6.0 - 8.5 g/dL   Albumin  3.8 3.8 - 4.9 g/dL   Globulin, Total 2.2 1.5 - 4.5 g/dL   Bilirubin Total <9.7 0.0 - 1.2 mg/dL   Alkaline Phosphatase 115 47 - 123 IU/L   AST 12 0  - 40 IU/L   ALT 12 0 - 44 IU/L  CBC with Differential/Platelet   Collection Time: 08/21/24  9:35 AM  Result Value Ref Range   WBC 7.3 3.4 - 10.8 x10E3/uL   RBC 2.76 (L) 4.14 - 5.80 x10E6/uL   Hemoglobin 8.9 (L) 13.0 - 17.7 g/dL   Hematocrit 72.0 (L) 62.4 - 51.0 %   MCV 101 (H) 79 - 97 fL   MCH 32.2 26.6 - 33.0 pg   MCHC 31.9 31.5 - 35.7 g/dL   RDW 84.3 (H) 88.3 - 84.5 %   Platelets 242 150 - 450 x10E3/uL   Neutrophils 62 Not Estab. %   Lymphs 22 Not Estab. %   Monocytes 10 Not Estab. %   Eos 4 Not Estab. %   Basos 1 Not Estab. %   Neutrophils Absolute 4.6 1.4 - 7.0 x10E3/uL   Lymphocytes Absolute 1.6 0.7 - 3.1 x10E3/uL   Monocytes Absolute 0.7 0.1 - 0.9 x10E3/uL   EOS (ABSOLUTE) 0.3 0.0 - 0.4 x10E3/uL   Basophils Absolute 0.1 0.0 - 0.2 x10E3/uL   Immature Granulocytes 1 Not Estab. %   Immature Grans (Abs) 0.1 0.0 - 0.1 x10E3/uL   NRBC 1 (H) 0 - 0 %  PSA   Collection Time: 08/21/24  9:35 AM  Result Value Ref Range   Prostate Specific Ag, Serum 0.7 0.0 - 4.0 ng/mL  Lipid Panel w/o Chol/HDL Ratio   Collection Time: 08/21/24  9:35 AM  Result Value Ref Range   Cholesterol, Total 181 100 - 199 mg/dL   Triglycerides 773 (H) 0 - 149 mg/dL   HDL 35 (L) >60 mg/dL   VLDL Cholesterol Cal 39 5 - 40 mg/dL   LDL Chol Calc (NIH) 892 (H) 0 - 99 mg/dL  Iron Binding Cap (TIBC)([facility])   Collection Time: 08/21/24  9:35 AM  Result Value Ref Range   Total Iron Binding Capacity 274 250 - 450 ug/dL   UIBC 788 888 - 656 ug/dL   Iron 63 38 - 830 ug/dL   Iron Saturation 23 15 - 55 %  Ferritin   Collection Time: 08/21/24  9:35 AM  Result Value Ref Range   Ferritin 196 30 - 400 ng/mL  B12   Collection Time: 08/21/24  9:35 AM  Result Value Ref Range   Vitamin B-12 429 232 - 1,245 pg/mL  Hepatitis C Antibody   Collection Time: 08/21/24  9:35 AM  Result Value Ref Range   Hep C Virus Ab Non Reactive Non Reactive  Hepatitis B surface antibody,quantitative   Collection Time:  08/21/24  9:35 AM  Result Value Ref Range   Hepatitis B Surf Ab Quant <3.5 (L) Immunity>10 mIU/mL  VITAMIN D  25 Hydroxy (Vit-D Deficiency, Fractures)   Collection Time: 08/21/24  9:35 AM  Result Value Ref Range   Vit D, 25-Hydroxy 19.4 (L) 30.0 - 100.0 ng/mL      Assessment & Plan:   Problem List Items Addressed This Visit       Cardiovascular and Mediastinum   Atrial fibrillation, chronic (HCC)   Not on anti-cogulation. Will get him into cardiology. Rate controlled. Call with any concerns.  Relevant Orders   AMB Referral VBCI Care Management   CBC with Differential/Platelet (Completed)   Ambulatory referral to Cardiology   Orthostatic hypotension   Doing better. Continue to monitor. Call with any concerns.       Relevant Orders   AMB Referral VBCI Care Management   Ambulatory referral to Cardiology     Genitourinary   BPH (benign prostatic hyperplasia)   Rechecking labs today. Await results. Treat as needed.       Relevant Orders   AMB Referral VBCI Care Management   PSA (Completed)   ESRD on dialysis [facility])   Continue to follow with nephrology. Continue dialysis. Labs drawn today.       Relevant Orders   AMB Referral VBCI Care Management   Comprehensive metabolic panel with GFR (Completed)   Ambulatory referral to Cardiology     Other   Panic disorder with agoraphobia   Under good control on current regimen. Continue current regimen. Continue to monitor. Call with any concerns. Refills given.        Relevant Medications   buPROPion  (WELLBUTRIN  XL) 300 MG 24 hr tablet   venlafaxine  XR (EFFEXOR -XR) 150 MG 24 hr capsule   venlafaxine  XR (EFFEXOR -XR) 75 MG 24 hr capsule   Other Relevant Orders   AMB Referral VBCI Care Management   Hyperlipidemia   Rechecking labs today. Await results. Treat as needed.       Relevant Orders   AMB Referral VBCI Care Management   Lipid Panel w/o Chol/HDL Ratio (Completed)   Vitamin D  deficiency   Rechecking labs  today. Await results.       Relevant Orders   VITAMIN D  25 Hydroxy (Vit-D Deficiency, Fractures) (Completed)   Other Visit Diagnoses       Housing instability    -  Primary   Referral to Social Worker placed today.   Relevant Orders   AMB Referral VBCI Care Management     Polydipsia       Labs drawn today. Await results. Treat as needed.   Relevant Orders   AMB Referral VBCI Care Management   Bayer DCA Hb A1c Waived (Completed)     Dental decay       Referral to dentistry placed today. Call with any concerns.   Relevant Orders   AMB Referral VBCI Care Management   Ambulatory referral to Dentistry     Pica       Labs drawn today. Await results. Treat as needed.   Relevant Orders   AMB Referral VBCI Care Management   Iron Binding Cap (TIBC)([facility]) (Completed)   Ferritin (Completed)   B12 (Completed)     Encounter for hepatitis C screening test for low risk patient       Labs drawn today. Await results.   Relevant Orders   Hepatitis C Antibody (Completed)     Hepatitis B vaccination status unknown       Labs drawn today. Await results.   Relevant Orders   Hepatitis B surface antibody,quantitative (Completed)        Follow up plan: Return in about 4 weeks (around 09/18/2024).  >40 minutes spent with patient and reviewing medical records today     "

## 2024-08-21 NOTE — Assessment & Plan Note (Signed)
 Not on anti-cogulation. Will get him into cardiology. Rate controlled. Call with any concerns.

## 2024-08-22 ENCOUNTER — Ambulatory Visit: Payer: Self-pay | Admitting: Family Medicine

## 2024-08-22 ENCOUNTER — Encounter: Payer: Self-pay | Admitting: Family Medicine

## 2024-08-22 ENCOUNTER — Telehealth: Payer: Self-pay

## 2024-08-22 DIAGNOSIS — D638 Anemia in other chronic diseases classified elsewhere: Secondary | ICD-10-CM

## 2024-08-22 LAB — VITAMIN B12: Vitamin B-12: 429 pg/mL (ref 232–1245)

## 2024-08-22 LAB — CBC WITH DIFFERENTIAL/PLATELET
Basophils Absolute: 0.1 x10E3/uL (ref 0.0–0.2)
Basos: 1 %
EOS (ABSOLUTE): 0.3 x10E3/uL (ref 0.0–0.4)
Eos: 4 %
Hematocrit: 27.9 % — ABNORMAL LOW (ref 37.5–51.0)
Hemoglobin: 8.9 g/dL — ABNORMAL LOW (ref 13.0–17.7)
Immature Grans (Abs): 0.1 x10E3/uL (ref 0.0–0.1)
Immature Granulocytes: 1 %
Lymphocytes Absolute: 1.6 x10E3/uL (ref 0.7–3.1)
Lymphs: 22 %
MCH: 32.2 pg (ref 26.6–33.0)
MCHC: 31.9 g/dL (ref 31.5–35.7)
MCV: 101 fL — ABNORMAL HIGH (ref 79–97)
Monocytes Absolute: 0.7 x10E3/uL (ref 0.1–0.9)
Monocytes: 10 %
NRBC: 1 % — ABNORMAL HIGH (ref 0–0)
Neutrophils Absolute: 4.6 x10E3/uL (ref 1.4–7.0)
Neutrophils: 62 %
Platelets: 242 x10E3/uL (ref 150–450)
RBC: 2.76 x10E6/uL — ABNORMAL LOW (ref 4.14–5.80)
RDW: 15.6 % — ABNORMAL HIGH (ref 11.6–15.4)
WBC: 7.3 x10E3/uL (ref 3.4–10.8)

## 2024-08-22 LAB — COMPREHENSIVE METABOLIC PANEL WITH GFR
ALT: 12 IU/L (ref 0–44)
AST: 12 IU/L (ref 0–40)
Albumin: 3.8 g/dL (ref 3.8–4.9)
Alkaline Phosphatase: 115 IU/L (ref 47–123)
BUN/Creatinine Ratio: 6 — ABNORMAL LOW (ref 9–20)
BUN: 42 mg/dL — ABNORMAL HIGH (ref 6–24)
Bilirubin Total: <0.2 mg/dL (ref 0.0–1.2)
CO2: 23 mmol/L (ref 20–29)
Calcium: 8.5 mg/dL — ABNORMAL LOW (ref 8.7–10.2)
Chloride: 104 mmol/L (ref 96–106)
Creatinine, Ser: 6.65 mg/dL — ABNORMAL HIGH (ref 0.76–1.27)
Globulin, Total: 2.2 g/dL (ref 1.5–4.5)
Glucose: 98 mg/dL (ref 70–99)
Potassium: 4.4 mmol/L (ref 3.5–5.2)
Sodium: 142 mmol/L (ref 134–144)
Total Protein: 6 g/dL (ref 6.0–8.5)
eGFR: 9 mL/min/1.73 — ABNORMAL LOW

## 2024-08-22 LAB — HEPATITIS B SURFACE ANTIBODY, QUANTITATIVE: Hepatitis B Surf Ab Quant: <3.5 m[IU]/mL — ABNORMAL LOW

## 2024-08-22 LAB — LIPID PANEL W/O CHOL/HDL RATIO
Cholesterol, Total: 181 mg/dL (ref 100–199)
HDL: 35 mg/dL — ABNORMAL LOW
LDL Chol Calc (NIH): 107 mg/dL — ABNORMAL HIGH (ref 0–99)
Triglycerides: 226 mg/dL — ABNORMAL HIGH (ref 0–149)
VLDL Cholesterol Cal: 39 mg/dL (ref 5–40)

## 2024-08-22 LAB — FERRITIN: Ferritin: 196 ng/mL (ref 30–400)

## 2024-08-22 LAB — IRON AND TIBC
Iron Saturation: 23 % (ref 15–55)
Iron: 63 ug/dL (ref 38–169)
Total Iron Binding Capacity: 274 ug/dL (ref 250–450)
UIBC: 211 ug/dL (ref 111–343)

## 2024-08-22 LAB — HEPATITIS C ANTIBODY: Hep C Virus Ab: NONREACTIVE

## 2024-08-22 LAB — VITAMIN D 25 HYDROXY (VIT D DEFICIENCY, FRACTURES): Vit D, 25-Hydroxy: 19.4 ng/mL — ABNORMAL LOW (ref 30.0–100.0)

## 2024-08-22 LAB — PSA: Prostate Specific Ag, Serum: 0.7 ng/mL (ref 0.0–4.0)

## 2024-08-22 MED ORDER — VITAMIN D (ERGOCALCIFEROL) 1.25 MG (50000 UNIT) PO CAPS: 50000.0000 [IU] | ORAL_CAPSULE | ORAL | 1 refills | Status: AC

## 2024-08-22 NOTE — Telephone Encounter (Signed)
 Medication list received from discharge at [facility] for patient. Medication list updated in patient's chart.

## 2024-08-24 ENCOUNTER — Telehealth: Payer: Self-pay

## 2024-08-24 ENCOUNTER — Other Ambulatory Visit: Payer: Self-pay | Admitting: Family Medicine

## 2024-08-24 NOTE — Telephone Encounter (Signed)
 Copied from CRM #[id number]. Topic: Clinical - Medication Refill >> Aug 24, 2024 11:48 AM [name] wrote: Medication: metoprolol  succinate (TOPROL -XL) 25 MG 24 hr tablet  Has the patient contacted their pharmacy? Yes (Agent: If no, request that the patient contact the pharmacy for the refill. If patient does not wish to contact the pharmacy document the reason why and proceed with request.) (Agent: If yes, when and what did the pharmacy advise?)  This is the patient's preferred pharmacy:  [facility] - [place], [place] - [address] [place] [address] Phone: [phone] Fax: [phone]  Is this the correct pharmacy for this prescription? Yes If no, delete pharmacy and type the correct one.   Has the prescription been filled recently? No  Is the patient out of the medication? Yes  Has the patient been seen for an appointment in the last year OR does the patient have an upcoming appointment? Yes  Can we respond through MyChart? No  Agent: Please be advised that Rx refills may take up to 3 business days. We ask that you follow-up with your pharmacy.

## 2024-08-24 NOTE — Telephone Encounter (Signed)
 Copied from CRM #[id number]. Topic: Referral - Status >> Aug 24, 2024 11:50 AM Antony RAMAN wrote: Reason for CRM: wondering about when he can get into the blood doctor

## 2024-08-27 ENCOUNTER — Telehealth: Payer: Self-pay | Admitting: Family Medicine

## 2024-08-27 ENCOUNTER — Other Ambulatory Visit: Payer: Self-pay | Admitting: *Deleted

## 2024-08-27 NOTE — Telephone Encounter (Signed)
 Requested medication (s) are due for refill today: yes  Requested medication (s) are on the active medication list: yes  Last refill:  06/19/24  Future visit scheduled: yes  Notes to clinic:  Unable to refill per protocol, last refill by another provider.      Requested Prescriptions  Pending Prescriptions Disp Refills   metoprolol  succinate (TOPROL -XL) 25 MG 24 hr tablet      Sig: Take 0.5 tablets (12.5 mg total) by mouth daily.     Cardiovascular:  Beta Blockers Passed - 08/27/2024  2:28 PM      Passed - Last BP in normal range    BP Readings from Last 1 Encounters:  08/21/24 129/79         Passed - Last Heart Rate in normal range    Pulse Readings from Last 1 Encounters:  08/21/24 87         Passed - Valid encounter within last 6 months    Recent Outpatient Visits           6 days ago Housing instability   [place] [facility] [place], [place], DO   6 months ago Panic disorder with agoraphobia   [place] [facility] [place], Megan P, DO   10 months ago Panic disorder with agoraphobia   [place] [facility] Vicci Duwaine SQUIBB, DO       Future Appointments             In 1 week End, Lonni, MD [facility]

## 2024-08-27 NOTE — Telephone Encounter (Signed)
 Copied from CRM [id number]. Topic: Clinical - Medical Advice >> Aug 27, 2024 11:21 AM Tonda B wrote: Reason for CRM: patient is calling in asking if he could get a sleep apenia machine please call pt back [phone]

## 2024-08-27 NOTE — Patient Outreach (Signed)
 Complex Care Management   Visit Note  08/27/2024  Name:  Brady Edwards MRN: [id number] DOB: Nov 02, 1965  Situation: Referral received for Complex Care Management related to Mental/Behavioral Health diagnosis anxiety and housing challenges I obtained verbal consent from Patient.  Visit completed with Patient  on the phone  Background:   Past Medical History:  Diagnosis Date   Atrial fibrillation (HCC)    Clotted dialysis access 05/10/2024   Depression    Hypertension     Assessment: Patient Reported Symptoms:  Cognitive Cognitive Status: Alert and oriented to person, place, and time, Insightful and able to interpret abstract concepts, Normal speech and language skills Cognitive/Intellectual Conditions Management [RPT]: None reported or documented in medical history or problem list   Health Maintenance Behaviors: Annual physical exam  Neurological Neurological Review of Symptoms: Dizziness Neurological Comment: occassional dizziness-sits down waits for it to pass  HEENT HEENT Symptoms Reported: No symptoms reported      Cardiovascular Cardiovascular Symptoms Reported: Dizziness, Palpitations Cardiovascular Comment: referred to a cardilogist  Respiratory Respiratory Symptoms Reported: No symptoms reported    Endocrine Endocrine Symptoms Reported: No symptoms reported Is patient diabetic?: No    Gastrointestinal Gastrointestinal Symptoms Reported: No symptoms reported      Genitourinary Genitourinary Symptoms Reported: No symptoms reported    Integumentary Integumentary Symptoms Reported: No symptoms reported    Musculoskeletal Musculoskelatal Symptoms Reviewed: Limited mobility        Psychosocial       Quality of Family Relationships: non-existent Do you feel physically threatened by others?: No    08/27/2024    PHQ2-9 Depression Screening   Little interest or pleasure in doing things More than half the days  Feeling down, depressed, or hopeless More than  half the days  PHQ-2 - Total Score 4  Trouble falling or staying asleep, or sleeping too much Several days  Feeling tired or having little energy Several days  Poor appetite or overeating  Not at all  Feeling bad about yourself - or that you are a failure or have let yourself or your family down Not at all  Trouble concentrating on things, such as reading the newspaper or watching television Not at all  Moving or speaking so slowly that other people could have noticed.  Or the opposite - being so fidgety or restless that you have been moving around a lot more than usual Not at all  Thoughts that you would be better off dead, or hurting yourself in some way Not at all  PHQ2-9 Total Score 6  If you checked off any problems, how difficult have these problems made it for you to do your work, take care of things at home, or get along with other people Somewhat difficult  Depression Interventions/Treatment Medication, Counseling    There were no vitals filed for this visit. Pain Score: 0-No pain  Medications Reviewed Today     Reviewed by Ermalinda Lenn HERO, LCSW (Social Worker) on 08/27/24 at 1104  Med List Status: <None>   Medication Order Taking? Sig Documenting Provider Last Dose Status Informant  acetaminophen  (TYLENOL ) 325 MG tablet [id number] Yes Take 650 mg by mouth every 6 (six) hours as needed for mild pain (pain score 1-3). [provider]  Active   b complex vitamins capsule [id number] Yes Take 1 capsule by mouth daily. [provider]  Active   buPROPion  (WELLBUTRIN  XL) 300 MG 24 hr tablet [id number] Yes Take 1 tablet (300 mg total) by mouth daily. Johnson, Megan P,  DO  Active   calcitRIOL  (ROCALTROL ) 0.25 MCG capsule [id number] Yes Take 0.25 mcg by mouth daily. [provider]  Active   cholecalciferol  (CHOLECALCIFEROL ) 25 MCG tablet [id number] Yes Take 1 tablet (1,000 Units total) by mouth daily. Jerelene Critchley, MD  Active   docusate sodium (COLACE) 100 MG  capsule [id number] Yes Take 100 mg by mouth 2 (two) times daily as needed for mild constipation. [provider]  Active   meclizine  (ANTIVERT ) 25 MG tablet [id number] Yes Take 25 mg by mouth 3 (three) times daily as needed for dizziness. [provider]  Active   metoprolol  succinate (TOPROL -XL) 25 MG 24 hr tablet [id number] Yes Take 0.5 tablets (12.5 mg total) by mouth daily. Jerelene Critchley, MD  Active   sevelamer  carbonate (RENVELA ) 800 MG tablet [id number] Yes Take 1 tablet (800 mg total) by mouth 3 (three) times daily with meals. Ponnala, Shruthi, MD  Active   venlafaxine  XR (EFFEXOR -XR) 150 MG 24 hr capsule [id number] Yes Take 1 capsule (150 mg total) by mouth daily with breakfast. To be taken with 75mg  for 225mg  daily total Johnson, Megan P, DO  Active   venlafaxine  XR (EFFEXOR -XR) 75 MG 24 hr capsule [id number] Yes To be taken with 150mg  for 225mg  daily total. Vicci, Megan P, DO  Active   Vitamin D , Ergocalciferol , (DRISDOL ) 1.25 MG (50000 UNIT) CAPS capsule [id number] Yes Take 1 capsule (50,000 Units total) by mouth every 7 (seven) days. Vicci Duwaine SQUIBB, DO  Active             Recommendation:   PCP Follow-up Specialty provider follow-up as scheduled Ongoing mental health follow up-referral sent to [facility]  Follow Up Plan:   Telephone follow up appointment date/time:  09/12/24 10am  Jason Frisbee, LCSW [place]  [facility], [facility] Licensed Clinical Social Worker  Direct Dial: [phone]

## 2024-08-27 NOTE — Patient Instructions (Signed)
 VVisit Information  Mr. Paulding was given information about Medicaid Managed Care team care coordination services as a part of their [facility] Medicaid benefit.   If you would like to schedule transportation through your [facility] plan, please call the following number at least 2 days in advance of your appointment: [phone].   You can also use the MTM portal or MTM mobile app to manage your rides. Reimbursement for transportation is available through [facility] For the portal, please go to [email or web address]  Call the [facility] Crisis Line at [phone], at any time, 24 hours a day, 7 days a week. If you are in danger or need immediate medical attention call 911.   Care plan and visit instructions communicated with the patient verbally today. Patient agrees to receive a copy via mail.   Social Worker will follow up with patient on 09/12/24 1oam.   Lenn Mean, LCSW [place]  [facility], [facility] Licensed Clinical Social Worker  Direct Dial: [phone]     Following is a copy of your plan of care:   Goals Addressed             This Visit's Progress    VBCI Social Work Care Plan LCSW       Problems:   Disease Management support and education needs related to Anxiety Agoraphobia  CSW Clinical Goal(s):   Over the next 90 days the Patient will demonstrate a reduction in symptoms related to Anxiety with Agoraphobia,  Interventions:  Mental Health:  Evaluation of current treatment plan related to Anxiety with Agoraphobia, Patient remains agreeable to ongoing mental health support to manage symptoms of anxiety and stress related to  housing challenges Patient confirmed having food stamps and is currently awaiting start of social security/disability payment to fund move Housing resources emailed to [email or web address] Active listening / Reflection utilized Behavioral Activation reviewed Discussed referral options to connect for  ongoing therapy: confirmed need for referral to be re-sent to [facility] for ongoing counseling Confirmed provider referral to [facility]  Patient Goals/Self-Care Activities:  Patient to follow up on resources provided and will schedule initial appointments when indicated  Plan:   Telephone follow up appointment with care management team member scheduled for:  09/12/24

## 2024-08-27 NOTE — Telephone Encounter (Signed)
 Called and spoke with patient. Discussed with patient about past sleep study and he states it has been a while, around 10 years. I advised the patient that he would need to have another sleep study done since it has been so long since the previous one. Patient questioned why another sleep study was needed when he had paid for one previously and already had a CPAP machine. Patient states he had the machine for 2 months and then sent it back. I explained to the patient that sleep studies are required to be done within a certain amount of time in order for a cpap machine to be ordered. Patient states that we just want him to pass away in his sleep. I explained that this is certainly not what we want to happen and explained that insurance requires an office visit to have documentation regarding his sleep apnea symptoms as well as an updated sleep study so that his insurance will cover the machine. Scheduled the patient an appointment to come in and discuss this with Dr. Vicci so we could get the process started.

## 2024-08-29 ENCOUNTER — Telehealth: Payer: Self-pay

## 2024-08-29 NOTE — Progress Notes (Signed)
 Complex Care Management Note  Care Guide Note 08/29/2024 Name: DEMETRES PROCHNOW MRN: [id number] DOB: [birth year]  BREXTON SOFIA is a 58 y.o. year old male who sees Vicci Duwaine SQUIBB, DO for primary care. I reached out to Vinie DELENA Remington by phone today to offer complex care management services.  Mr. Pellecchia was given information about Complex Care Management services today including:   The Complex Care Management services include support from the care team which includes your Nurse Care Manager, Clinical Social Worker, or Pharmacist.  The Complex Care Management team is here to help remove barriers to the health concerns and goals most important to you. Complex Care Management services are voluntary, and the patient may decline or stop services at any time by request to their care team member.   Complex Care Management Consent Status: Patient agreed to services and verbal consent obtained.   Follow up plan:  Telephone appointment with complex care management team member scheduled for:  09/07/2024  Encounter Outcome:  Patient Scheduled  Jeoffrey Buffalo , RMA     [place]  [facility], [facility] Guide  Direct Dial: [phone]  Website: [email or web address]

## 2024-08-31 ENCOUNTER — Inpatient Hospital Stay

## 2024-08-31 ENCOUNTER — Inpatient Hospital Stay: Admitting: Oncology

## 2024-08-31 ENCOUNTER — Encounter: Payer: Self-pay | Admitting: Oncology

## 2024-08-31 VITALS — BP 140/89 | HR 101 | Temp 96.3°F | Resp 18 | Wt 289.4 lb

## 2024-08-31 DIAGNOSIS — D649 Anemia, unspecified: Secondary | ICD-10-CM

## 2024-08-31 DIAGNOSIS — Z803 Family history of malignant neoplasm of breast: Secondary | ICD-10-CM | POA: Diagnosis not present

## 2024-08-31 DIAGNOSIS — Z808 Family history of malignant neoplasm of other organs or systems: Secondary | ICD-10-CM

## 2024-08-31 DIAGNOSIS — D631 Anemia in chronic kidney disease: Secondary | ICD-10-CM | POA: Insufficient documentation

## 2024-08-31 DIAGNOSIS — N185 Chronic kidney disease, stage 5: Secondary | ICD-10-CM | POA: Diagnosis not present

## 2024-08-31 DIAGNOSIS — Z992 Dependence on renal dialysis: Secondary | ICD-10-CM | POA: Insufficient documentation

## 2024-08-31 DIAGNOSIS — Z8049 Family history of malignant neoplasm of other genital organs: Secondary | ICD-10-CM | POA: Insufficient documentation

## 2024-08-31 LAB — RETICULOCYTES
Immature Retic Fract: 24.6 % — ABNORMAL HIGH (ref 2.3–15.9)
RBC.: 2.73 MIL/uL — ABNORMAL LOW (ref 4.22–5.81)
Retic Count, Absolute: 117.1 K/uL (ref 19.0–186.0)
Retic Ct Pct: 4.3 % — ABNORMAL HIGH (ref 0.4–3.1)

## 2024-08-31 LAB — CBC WITH DIFFERENTIAL/PLATELET
Abs Immature Granulocytes: 0.04 K/uL (ref 0.00–0.07)
Basophils Absolute: 0.1 K/uL (ref 0.0–0.1)
Basophils Relative: 1 %
Eosinophils Absolute: 0.4 K/uL (ref 0.0–0.5)
Eosinophils Relative: 5 %
HCT: 27.4 % — ABNORMAL LOW (ref 39.0–52.0)
Hemoglobin: 9 g/dL — ABNORMAL LOW (ref 13.0–17.0)
Immature Granulocytes: 1 %
Lymphocytes Relative: 23 %
Lymphs Abs: 1.9 K/uL (ref 0.7–4.0)
MCH: 33.2 pg (ref 26.0–34.0)
MCHC: 32.8 g/dL (ref 30.0–36.0)
MCV: 101.1 fL — ABNORMAL HIGH (ref 80.0–100.0)
Monocytes Absolute: 0.8 K/uL (ref 0.1–1.0)
Monocytes Relative: 10 %
Neutro Abs: 4.9 K/uL (ref 1.7–7.7)
Neutrophils Relative %: 60 %
Platelets: 210 K/uL (ref 150–400)
RBC: 2.71 MIL/uL — ABNORMAL LOW (ref 4.22–5.81)
RDW: 17.2 % — ABNORMAL HIGH (ref 11.5–15.5)
WBC: 8.1 K/uL (ref 4.0–10.5)
nRBC: 0 % (ref 0.0–0.2)

## 2024-08-31 LAB — LACTATE DEHYDROGENASE: LDH: 207 U/L (ref 105–235)

## 2024-08-31 LAB — FOLATE: Folate: 12.2 ng/mL

## 2024-08-31 MED ORDER — METOPROLOL SUCCINATE ER 25 MG PO TB24: 12.5000 mg | ORAL_TABLET | Freq: Every day | ORAL | 0 refills | Status: AC

## 2024-08-31 NOTE — Progress Notes (Signed)
 "  Hematology/Oncology Consult note [facility] [phone] [phone]  Patient Care Team: Vicci Duwaine SQUIBB, DO as PCP - General (Family Medicine) Ermalinda Penton [place], [place] as [facility] Care Management   Name of the [name]  [id number]  [birth year]    Reason for referral-anemia   Referring physician-Dr. Duwaine Vicci  Date of visit: 08/31/2024   History of presenting illness- Patient is a 59 year old male with a past medical history significant for obstructive sleep apnea, BPH, hyperlipidemia, history of A-fib and panic disorder referred for anemia.  He has a history of end-stage renal disease and follows up with Dr. Douglas  Labs from 08/21/2024 showed white count of 7.3, H&H of 8.9/27.9 with an MCV of 101 and a platelet count of 242.  Ferritin levels were normal at 196.  B12 levels normal at 429.  Iron studies were normal with an iron saturation of 23% looking back at his prior CBCs patient's hemoglobin has been mostly between 10-11 over the last 1 year occasionally drifting down to the eights but eventually bouncing back up to 11.  CMP showed elevated creatinine of 6.6 with a normal total protein and albumin   He has been undergoing hemodialysis for approximately four months, three times weekly via a catheter, with a fistula planned for future use. He has chronic anemia, with hemoglobin levels typically ranging from 10 to 11 g/dL over the past year. Ten days prior to this visit, his hemoglobin decreased to 8.9 g/dL from 11 g/dL two months ago. Iron studies show normal iron and ferritin (ferritin 196) with adequate iron saturation. Vitamin B12 is within normal limits. He is unsure of his folic acid  status and notes that his protein levels have been low on several occasions.  He experiences significant fatigue, particularly after dialysis sessions, describing profound tiredness and decreased motivation, often requiring naps. On non-dialysis days,  he continues to have limited activity tolerance. He denies abnormal bleeding, bruising, chest pain, dyspnea, heart failure symptoms, myocardial infarction, stroke, neurological deficits, seizures, numbness, weakness, dizziness, or balance disturbances.  The patient does not believe he is receiving erythropoietin injections during dialysis.       ECOG PS- 1  Pain scale- 9   Review of systems- Review of Systems  Constitutional:  Positive for malaise/fatigue. Negative for chills, fever and weight loss.  HENT:  Negative for congestion, ear discharge and nosebleeds.   Eyes:  Negative for blurred vision.  Respiratory:  Negative for cough, hemoptysis, sputum production, shortness of breath and wheezing.   Cardiovascular:  Negative for chest pain, palpitations, orthopnea and claudication.  Gastrointestinal:  Negative for abdominal pain, blood in stool, constipation, diarrhea, heartburn, melena, nausea and vomiting.  Genitourinary:  Negative for dysuria, flank pain, frequency, hematuria and urgency.  Musculoskeletal:  Negative for back pain, joint pain and myalgias.  Skin:  Negative for rash.  Neurological:  Negative for dizziness, tingling, focal weakness, seizures, weakness and headaches.  Endo/Heme/Allergies:  Does not bruise/bleed easily.  Psychiatric/Behavioral:  Negative for depression and suicidal ideas. The patient does not have insomnia.     Allergies[1]  Patient Active Problem List   Diagnosis Date Noted   Vitamin D  deficiency 05/24/2024   ESRD on dialysis (HCC) 05/23/2024   Anemia of chronic disease 05/23/2024   Orthostatic hypotension 04/19/2024   Obesity, Class III, BMI 40-49.9 (morbid obesity) (HCC) 04/07/2024   Depression 04/07/2024   Atrial fibrillation, chronic (HCC) 04/07/2024   OSA (obstructive sleep apnea) 02/27/2024   BPH (benign prostatic hyperplasia)  10/06/2018   Hyperlipidemia 08/29/2018   Tobacco abuse 06/13/2015   Panic disorder with agoraphobia 05/16/2015      Past Medical History:  Diagnosis Date   Atrial fibrillation (HCC)    Clotted dialysis access 05/10/2024   Depression    Hypertension      Past Surgical History:  Procedure Laterality Date   DIALYSIS/PERMA CATHETER INSERTION N/A 04/12/2024   Procedure: DIALYSIS/PERMA CATHETER INSERTION;  Surgeon: Marea Selinda RAMAN, MD;  Location: [facility] INVASIVE CV LAB;  Service: Cardiovascular;  Laterality: N/A;   DIALYSIS/PERMA CATHETER REPAIR Right 05/10/2024   Procedure: DIALYSIS/PERMA CATHETER REPAIR;  Surgeon: Marea Selinda RAMAN, MD;  Location: [facility] INVASIVE CV LAB;  Service: Cardiovascular;  Laterality: Right;   DIALYSIS/PERMA CATHETER REPAIR N/A 06/01/2024   Procedure: DIALYSIS/PERMA CATHETER REPAIR;  Surgeon: Marea Selinda RAMAN, MD;  Location: [facility] INVASIVE CV LAB;  Service: Cardiovascular;  Laterality: N/A;   TEMPORARY DIALYSIS CATHETER N/A 04/09/2024   Procedure: TEMPORARY DIALYSIS CATHETER;  Surgeon: Marea Selinda RAMAN, MD;  Location: [facility] INVASIVE CV LAB;  Service: Cardiovascular;  Laterality: N/A;    Social History   Socioeconomic History   Marital status: Divorced    Spouse name: Not on file   Number of children: Not on file   Years of education: Not on file   Highest education level: Not on file  Occupational History   Not on file  Tobacco Use   Smoking status: Every Day    Current packs/day: 0.50    Types: Cigarettes   Smokeless tobacco: Never  Vaping Use   Vaping status: Never Used  Substance and Sexual Activity   Alcohol use: Not Currently    Alcohol/week: 0.0 standard drinks of alcohol   Drug use: No   Sexual activity: Not Currently  Other Topics Concern   Not on file  Social History Narrative   Not on file   Social Drivers of Health   Tobacco Use: High Risk (08/31/2024)   Patient History    Smoking Tobacco Use: Every Day    Smokeless Tobacco Use: Never    Passive Exposure: Not on file  Financial Resource Strain: Not on file  Food Insecurity: No Food Insecurity (08/27/2024)   Epic     Worried About Programme Researcher, Broadcasting/film/video in the Last Year: Never true    Ran Out of Food in the Last Year: Never true  Transportation Needs: No Transportation Needs (08/27/2024)   Epic    Lack of Transportation (Medical): No    Lack of Transportation (Non-Medical): No  Physical Activity: Not on file  Stress: Not on file  Social Connections: Not on file  Intimate Partner Violence: Not At Risk (08/27/2024)   Epic    Fear of Current or Ex-Partner: No    Emotionally Abused: No    Physically Abused: No    Sexually Abused: No  Depression (PHQ2-9): Medium Risk (08/27/2024)   Depression (PHQ2-9)    PHQ-2 Score: 6  Alcohol Screen: Not on file  Housing: High Risk (08/27/2024)   Epic    Unable to Pay for Housing in the Last Year: Yes    Number of Times Moved in the Last Year: Not on file    Homeless in the Last Year: No  Utilities: Not At Risk (08/27/2024)   Epic    Threatened with loss of utilities: No  Health Literacy: Not on file     Family History  Problem Relation Age of Onset   Cancer Mother  uterine, ovarian, and breast   Heart disease Father        MI   Diabetes Brother    Cancer Maternal Grandmother        skin   Cancer Maternal Grandfather        skin   Cancer Paternal Grandmother     Current Medications[2]   Physical exam:  Vitals:   08/31/24 1325  BP: (!) 142/92  Pulse: (!) 101  Resp: 18  Temp: (!) 96.3 F (35.7 C)  TempSrc: Tympanic  SpO2: 97%  Weight: 289 lb 6.4 oz (131.3 kg)   Physical Exam Cardiovascular:     Rate and Rhythm: Normal rate and regular rhythm.     Heart sounds: Normal heart sounds.  Pulmonary:     Effort: Pulmonary effort is normal.     Breath sounds: Normal breath sounds.     Comments: Dialysis catheter over right chest wall Abdominal:     General: Bowel sounds are normal.     Palpations: Abdomen is soft.  Musculoskeletal:     Comments: Lipoma noted over left back  Skin:    General: Skin is warm and dry.  Neurological:      Mental Status: He is alert and oriented to person, place, and time.           Latest Ref Rng & Units 08/21/2024    9:35 AM  CMP  Glucose 70 - 99 mg/dL 98   BUN 6 - 24 mg/dL 42   Creatinine 9.23 - 1.27 mg/dL 3.34   Sodium 865 - 855 mmol/L 142   Potassium 3.5 - 5.2 mmol/L 4.4   Chloride 96 - 106 mmol/L 104   CO2 20 - 29 mmol/L 23   Calcium  8.7 - 10.2 mg/dL 8.5   Total Protein 6.0 - 8.5 g/dL 6.0   Total Bilirubin 0.0 - 1.2 mg/dL <9.7   Alkaline Phos 47 - 123 IU/L 115   AST 0 - 40 IU/L 12   ALT 0 - 44 IU/L 12       Latest Ref Rng & Units 08/21/2024    9:35 AM  CBC  WBC 3.4 - 10.8 x10E3/uL 7.3   Hemoglobin 13.0 - 17.7 g/dL 8.9   Hematocrit 62.4 - 51.0 % 27.9   Platelets 150 - 450 x10E3/uL 242      Assessment and plan- Patient is a 59 y.o. male referred for anemia of chronic kidney disease  In terms of anemia workup: Patient noted to have normal iron studies And B12 levels.  I am checking myeloma panel, serum free light chains, folate LDH reticulocyte count and haptoglobin.  It is unclear if patient is receiving EPO with dialysis or not and I will reach out to Dr. Douglas to clarify that.  Baseline hemoglobin runs between 10-11 but most recent hemoglobin was down to 8.9.  If hemoglobin remains persistently less than 9 I will consider initiating EPO if he cannot get it with dialysis.  Discussed risks and benefits of EPO including all but not limited to possible risk of thromboembolic events.  Patient understands and agrees to proceed as planned.  If anemia fails to improve despite EPO injections we will consider bone marrow biopsy at that time   Thank you for this kind referral and the opportunity to participate in the care of this patient   Visit Diagnosis 1. Normocytic anemia     Dr. Annah Skene, MD, MPH [facility] [phone] 08/31/2024                   [  1] No Known Allergies [2]  Current Outpatient Medications:     acetaminophen  (TYLENOL ) 325 MG tablet, Take 650 mg by mouth every 6 (six) hours as needed for mild pain (pain score 1-3)., Disp: , Rfl:    b complex vitamins capsule, Take 1 capsule by mouth daily., Disp: , Rfl:    buPROPion  (WELLBUTRIN  XL) 300 MG 24 hr tablet, Take 1 tablet (300 mg total) by mouth daily., Disp: 90 tablet, Rfl: 1   calcitRIOL  (ROCALTROL ) 0.25 MCG capsule, Take 0.25 mcg by mouth daily., Disp: , Rfl:    cholecalciferol  (CHOLECALCIFEROL ) 25 MCG tablet, Take 1 tablet (1,000 Units total) by mouth daily., Disp: , Rfl:    docusate sodium (COLACE) 100 MG capsule, Take 100 mg by mouth 2 (two) times daily as needed for mild constipation., Disp: , Rfl:    meclizine  (ANTIVERT ) 25 MG tablet, Take 25 mg by mouth 3 (three) times daily as needed for dizziness., Disp: , Rfl:    metoprolol  succinate (TOPROL -XL) 25 MG 24 hr tablet, Take 0.5 tablets (12.5 mg total) by mouth daily., Disp: 45 tablet, Rfl: 0   sevelamer  carbonate (RENVELA ) 800 MG tablet, Take 1 tablet (800 mg total) by mouth 3 (three) times daily with meals., Disp: , Rfl:    venlafaxine  XR (EFFEXOR -XR) 150 MG 24 hr capsule, Take 1 capsule (150 mg total) by mouth daily with breakfast. To be taken with 75mg  for 225mg  daily total, Disp: 90 capsule, Rfl: 1   venlafaxine  XR (EFFEXOR -XR) 75 MG 24 hr capsule, To be taken with 150mg  for 225mg  daily total., Disp: 90 capsule, Rfl: 1   Vitamin D , Ergocalciferol , (DRISDOL ) 1.25 MG (50000 UNIT) CAPS capsule, Take 1 capsule (50,000 Units total) by mouth every 7 (seven) days., Disp: 12 capsule, Rfl: 1  "

## 2024-08-31 NOTE — Progress Notes (Signed)
 Patient is referred here for anemia of chronic disease. Patient states that he is on dialysis and states fatigued.

## 2024-09-01 LAB — HAPTOGLOBIN: Haptoglobin: 125 mg/dL (ref 29–370)

## 2024-09-03 ENCOUNTER — Other Ambulatory Visit: Payer: Self-pay | Admitting: Family Medicine

## 2024-09-03 LAB — KAPPA/LAMBDA LIGHT CHAINS
Kappa free light chain: 123.6 mg/L — ABNORMAL HIGH (ref 3.3–19.4)
Kappa, lambda light chain ratio: 2.09 — ABNORMAL HIGH (ref 0.26–1.65)
Lambda free light chains: 59.1 mg/L — ABNORMAL HIGH (ref 5.7–26.3)

## 2024-09-03 NOTE — Telephone Encounter (Signed)
 Copied from CRM [id number]. Topic: Clinical - Medication Refill >> Sep 03, 2024  9:37 AM Charolett L wrote: Medication: meclizine  (ANTIVERT ) 25 MG tablet  Has the patient contacted their pharmacy? Yes (Agent: If no, request that the patient contact the pharmacy for the refill. If patient does not wish to contact the pharmacy document the reason why and proceed with request.) (Agent: If yes, when and what did the pharmacy advise?)  This is the patient's preferred pharmacy:  [facility] - [place], [place] - [address] [place] [address] Phone: [phone] Fax: [phone]  Is this the correct pharmacy for this prescription? Yes If no, delete pharmacy and type the correct one.   Has the prescription been filled recently? Yes  Is the patient out of the medication? Yes  Has the patient been seen for an appointment in the last year OR does the patient have an upcoming appointment? Yes  Can we respond through MyChart? Yes  Agent: Please be advised that Rx refills may take up to 3 business days. We ask that you follow-up with your pharmacy.

## 2024-09-04 LAB — MULTIPLE MYELOMA PANEL, SERUM
Albumin SerPl Elph-Mcnc: 3.5 g/dL (ref 2.9–4.4)
Albumin/Glob SerPl: 1.3 (ref 0.7–1.7)
Alpha 1: 0.3 g/dL (ref 0.0–0.4)
Alpha2 Glob SerPl Elph-Mcnc: 0.7 g/dL (ref 0.4–1.0)
B-Globulin SerPl Elph-Mcnc: 1 g/dL (ref 0.7–1.3)
Gamma Glob SerPl Elph-Mcnc: 0.9 g/dL (ref 0.4–1.8)
Globulin, Total: 2.8 g/dL (ref 2.2–3.9)
IgA: 209 mg/dL (ref 90–386)
IgG (Immunoglobin G), Serum: 823 mg/dL (ref 603–1613)
IgM (Immunoglobulin M), Srm: 150 mg/dL (ref 20–172)
Total Protein ELP: 6.3 g/dL (ref 6.0–8.5)

## 2024-09-04 MED ORDER — MECLIZINE HCL 25 MG PO TABS: 25.0000 mg | ORAL_TABLET | Freq: Three times a day (TID) | ORAL | 0 refills | Status: AC | PRN

## 2024-09-04 NOTE — Progress Notes (Unsigned)
 " Cardiology Office Note:  .   Date:  09/05/2024  ID:  Brady Edwards Remington, DOB Jul 23, 1966, MRN [id number] PCP: Vicci Duwaine SQUIBB, DO  [facility] Providers Cardiologist:  None     History of Present Illness: .   Discussed the use of AI scribe software for clinical note transcription with the patient, who gave verbal consent to proceed.  Brady Edwards is a 59 y.o. male with reported history of atrial fibrillation, hypertension, hyperlipidemia, OSA on CPAP, [name]-stage renal disease and orthostatic hypotension, who has been referred for management of atrial fibrillation.  He was previously followed by Dr. Bosie at [facility] cardiology, having last been seen there in 2021.  EKG obtained in the ED in 03/2024 was interpreted as atrial fibrillation though tracing clearly shows P waves consistent with sinus tachycardia with PACs.  There is no definite evidence of atrial fibrillation in our system.  Most recent notes from [facility] clinic also do not corroborate diagnosis of atrial fibrillation; 48-hour Holter monitor in 2019 showed sinus rhythm with occasional PACs and PVCs.  Mr. Chew believes that his a-fib was initially diagnosed during a routine appointment after multiple heart monitors failed to capture it. His symptoms are infrequent and primarily triggered by caffeine  intake or lack of sleep. He describes experiencing a 'skip' in his heartbeat without associated tachycardia or prolonged episodes. Despite avoiding caffeine  for years, he recently resumed minimal intake without significant issues. Palpitations occur approximately once a week, typically as a single skip.  He has kidney failure, identified in late July or early August 2025, and is currently on dialysis three times a week via a catheter while awaiting fistula placement. He experiences persistent dyspnea and has been bedridden for four months. He attributes that shortness of breath to his kidney failure, anemia, deconditining, and  morbid obesity. He is now attempting to increase his activity level.  His family history is significant for his father having multiple myocardial infarctions, the first occurring in his early forties and the last in his early sixties. He smokes less than a pack a day and has no current plans to quit. He has no history of cerebrovascular accidents and no peripheral edema.     ROS: Mr. Throne has a prominent bump on his upper back that has been present for years.  He believes that his insurance did not wish to pay for excision in the past.  However, it makes it difficult for him to sit in a high-back chair or in a car.  Studies Reviewed: SABRA   EKG Interpretation Date/Time:  Wednesday September 05 2024 10:32:44 EST Ventricular Rate:  90 PR Interval:  162 QRS Duration:  90 QT Interval:  374 QTC Calculation: 457 R Axis:   5  Text Interpretation: Normal sinus rhythm Normal ECG When compared with ECG of 17-Apr-2024 15:36, No significant change was found Confirmed by Arjen Deringer ([id number]) on 09/05/2024 10:41:31 AM    TTE (04/11/2024): Normal LV size with mild LVH.  LVEF 65-70% with normal wall motion and diastolic function.  Normal RV size and function.  Normal biatrial size.  No pericardial effusion.  Trivial MR and TR.  Normal CVP.  Risk Assessment/Calculations:    CHA2DS2-VASc Score = 1   This indicates a 0.6% annual risk of stroke. The patient's score is based upon: CHF History: 0 HTN History: 1 Diabetes History: 0 Stroke History: 0 Vascular Disease History: 0 Age Score: 0 Gender Score: 0  Physical Exam:   VS:  BP 112/70 (BP Location: Left Arm, Patient Position: Sitting, Cuff Size: Large)   Pulse 90   Ht 5' 8 (1.727 m)   Wt 280 lb (127 kg)   SpO2 96%   BMI 42.57 kg/m    Wt Readings from Last 3 Encounters:  09/05/24 280 lb (127 kg)  08/31/24 289 lb 6.4 oz (131.3 kg)  08/21/24 283 lb 6.4 oz (128.5 kg)    General:  NAD. Neck: No gross JVD, though body habitus limits  evaluation. Lungs: Clear to auscultation bilaterally without wheezes or crackles. Heart: Regular rate and rhythm without murmurs, rubs, or gallops. Abdomen: Soft, nontender, nondistended. Extremities: No lower extremity edema.  ASSESSMENT AND PLAN: .    Palpitations with questionable atrial fibrillation: [name] believes that he may have had atrial fibrillation in the past though he does not recall specifics about this diagnosis.  The symptoms he describes as atrial fibrillation are more consistent with PACs or PVCs, as they are typically very brief skips or pauses.  During his hospitalization in August, EKGs in the ED were interpreted as as atrial fibrillation.  However, on my personal review of the tracings, they are consistent with sinus tachycardia with PACs and not atrial fibrillation.  In fact, I have reviewed all of the EKGs available in our system back to 2011; none of them confirm atrial fibrillation.  We discussed further evaluation options including repeat ambulatory cardiac monitoring, referral to EP for consideration of implantable loop recorder, or home monitoring with a wearable device.  [name] wishes to defer further formal testing at this time and will consider procuring a Kardia mobile device to obtain rhythm strips on his own when he experiences palpitations.  Given that his CHA2DS2-VASc score is 1, it would be reasonable to defer anticoagulation even if atrial fibrillation is confirmed.  We will continue metoprolol  succinate 12.5 mg daily for symptomatic control of his palpitations.  Dyspnea on exertion: I agree with [name] that his DOE is likely multifactorial, including his anemia, morbid obesity, and deconditioning.  His echocardiogram in August showed vigorous LV systolic function with no significant abnormalities.  We discussed noninvasive ischemia testing to exclude this being an anginal equivalent.  However, [name] wishes to defer this for  now.  Hypertension: Blood pressure well-controlled today.  Continue metoprolol  succinate 12.5 mg daily with ongoing management per Dr. Vicci and nephrology.  Modest Draeger-stage renal disease: Continue hemodialysis per nephrology.  Morbid obesity: BMI 43.  Weight loss encouraged through diet and exercise.  Tobacco abuse: Mr. Niven continues to smoke and is not interested in quitting at this time; nonetheless smoking cessation was encouraged.    Dispo: Return to clinic in 6 months.  Signed, Lonni Hanson, MD  "

## 2024-09-04 NOTE — Telephone Encounter (Signed)
 Requested medications are due for refill today.  unsure  Requested medications are on the active medications list.  yes  Last refill. 08/22/2024  Future visit scheduled.   yes  Notes to clinic.  Medication is historical. Refill not delegated.    Requested Prescriptions  Pending Prescriptions Disp Refills   meclizine  (ANTIVERT ) 25 MG tablet 30 tablet     Sig: Take 1 tablet (25 mg total) by mouth 3 (three) times daily as needed for dizziness.     Not Delegated - Gastroenterology: Antiemetics Failed - 09/04/2024  2:11 PM      Failed - This refill cannot be delegated      Passed - Valid encounter within last 6 months    Recent Outpatient Visits           2 weeks ago Housing instability   [place] [facility] Early, [place], DO   6 months ago Panic disorder with agoraphobia   [place] [facility] Findlay, Megan P, DO   10 months ago Panic disorder with agoraphobia   [place] [facility] Vicci Duwaine SQUIBB, DO       Future Appointments             Tomorrow End, Lonni, MD [facility] [place]   In 2 weeks Melanee Annah BROCKS, MD [facility]

## 2024-09-05 ENCOUNTER — Ambulatory Visit: Attending: Internal Medicine | Admitting: Internal Medicine

## 2024-09-05 ENCOUNTER — Encounter: Payer: Self-pay | Admitting: Internal Medicine

## 2024-09-05 VITALS — BP 112/70 | HR 90 | Ht 68.0 in | Wt 280.0 lb

## 2024-09-05 DIAGNOSIS — N186 End stage renal disease: Secondary | ICD-10-CM | POA: Insufficient documentation

## 2024-09-05 DIAGNOSIS — R002 Palpitations: Secondary | ICD-10-CM | POA: Insufficient documentation

## 2024-09-05 DIAGNOSIS — I10 Essential (primary) hypertension: Secondary | ICD-10-CM | POA: Insufficient documentation

## 2024-09-05 DIAGNOSIS — R0609 Other forms of dyspnea: Secondary | ICD-10-CM | POA: Insufficient documentation

## 2024-09-05 DIAGNOSIS — Z720 Tobacco use: Secondary | ICD-10-CM | POA: Insufficient documentation

## 2024-09-05 NOTE — Patient Instructions (Signed)
 Medication Instructions:  Your physician recommends that you continue on your current medications as directed. Please refer to the Current Medication list given to you today.   *If you need a refill on your cardiac medications before your next appointment, please call your pharmacy*  Lab Work: None ordered at this time   Follow-Up: At [facility], you and your health needs are our priority.  As part of our continuing mission to provide you with exceptional heart care, our providers are all part of one team.  This team includes your primary Cardiologist (physician) and Advanced Practice Providers or APPs (Physician Assistants and Nurse Practitioners) who all work together to provide you with the care you need, when you need it.  Your next appointment:   6 month(s)  Provider:   You may see Lonni Hanson, MD or one of the following Advanced Practice Providers on your designated Care Team:   Lonni Meager, NP Lesley Maffucci, PA-C Bernardino Bring, PA-C Cadence Beaumont, PA-C Tylene Lunch, NP Barnie Hila, NP  We recommend signing up for the patient portal called MyChart.  Sign up information is provided on this After Visit Summary.  MyChart is used to connect with patients for Virtual Visits (Telemedicine).  Patients are able to view lab/test results, encounter notes, upcoming appointments, etc.  Non-urgent messages can be sent to your provider as well.   To learn more about what you can do with MyChart, go to [email or web address].

## 2024-09-06 ENCOUNTER — Encounter: Payer: Self-pay | Admitting: Family Medicine

## 2024-09-06 DIAGNOSIS — I491 Atrial premature depolarization: Secondary | ICD-10-CM | POA: Insufficient documentation

## 2024-09-07 ENCOUNTER — Other Ambulatory Visit: Payer: Self-pay

## 2024-09-07 NOTE — Patient Instructions (Signed)
 Visit Information  [name] was given information about Medicaid Managed Care team care coordination services as a part of their [facility] Medicaid benefit.   If you would like to schedule transportation through your [facility] plan, please call the following number at least 2 days in advance of your appointment: [phone].   You can also use the MTM portal or MTM mobile app to manage your rides. Reimbursement for transportation is available through [facility]! For the portal, please go to [email or web address]  Call the [facility] Crisis Line at [phone], at any time, 24 hours a day, 7 days a week. If you are in danger or need immediate medical attention call 911.  Please see education materials related to End stage renal disease provided by MyChart link.  Patient verbalizes understanding of instructions and care plan provided today and agrees to view in MyChart. Active MyChart status and patient understanding of how to access instructions and care plan via MyChart confirmed with patient.     RN Care Manager will f/u via telephone in 2 weeks  Brady Paule RN RN Care Manager [facility] [phone]   Following is a copy of your plan of care:   Goals Addressed             This Visit's Progress    VBCI RN Care Plan       Problems:  Care Coordination needs related to Housing  Chronic Disease Management support and education needs related to CKD Stage end stage Financial Constraints.  Goal: Over the next 90 days the Patient will continue to work with Medical Illustrator and/or Social Worker to address care management and care coordination needs related to CKD Stage end stage as evidenced by adherence to care management team scheduled appointments      Interventions:    Chronic Kidney Disease Interventions: Evaluation of current treatment plan related to chronic kidney disease self management and patient's adherence to plan as established by provider       Reviewed prescribed diet renal Reviewed medications with patient and discussed importance of compliance    Discussed complications of poorly controlled blood pressure such as heart disease, stroke, circulatory complications, vision complications, kidney impairment, sexual dysfunction    Reviewed scheduled/upcoming provider appointments including    Advised patient to discuss anxiety and any arising concerns with provider    Discussed plans with patient for ongoing care management follow up and provided patient with direct contact information for care management team    Screening for signs and symptoms of depression related to chronic disease state      Discussed the impact of chronic kidney disease on daily life and mental health and acknowledged and normalized feelings of disempowerment, fear, and frustration    Assessed social determinant of health barriers    Provided education on kidney disease progression    Last practice recorded BP readings:  BP Readings from Last 3 Encounters:  09/05/24 112/70  08/31/24 (!) 140/89  08/21/24 129/79   Most recent eGFR/CrCl:  Lab Results  Component Value Date   EGFR 9 (L) 08/21/2024    No components found for: CRCL  Patient Self-Care Activities:  Attend all scheduled provider appointments Call provider office for new concerns or questions  Take medications as prescribed   Work with the social worker to address care coordination needs and will continue to work with the clinical team to address health care and disease management related needs  Plan:  The care management team will reach out  to the patient again over the next 14 days.

## 2024-09-07 NOTE — Patient Outreach (Signed)
 Complex Care Management   Visit Note  09/07/2024  Name:  Brady Edwards MRN: [id number] DOB: 1965-10-03  Situation: Referral received for Complex Care Management related to ESRD I obtained verbal consent from Patient.  Visit completed with Patient  on the phone  Background:   Past Medical History:  Diagnosis Date   Atrial fibrillation (HCC)    Clotted dialysis access 05/10/2024   Depression    Hypertension     Assessment: Patient Reported Symptoms:  Cognitive Cognitive Status: Alert and oriented to person, place, and time   Health Maintenance Behaviors: Annual physical exam Healing Pattern: Average  Neurological Neurological Review of Symptoms: Dizziness Neurological Management Strategies: Routine screening Neurological Self-Management Outcome: 4 (good)  HEENT HEENT Symptoms Reported: No symptoms reported HEENT Management Strategies: Routine screening HEENT Self-Management Outcome: 4 (good)    Cardiovascular Cardiovascular Symptoms Reported: Dizziness, Palpitations Does patient have uncontrolled Hypertension?: Yes Is patient checking Blood Pressure at home?: Yes Patient's Recent BP reading at home: cheecked a dialysis Cardiovascular Management Strategies: Routine screening Cardiovascular Self-Management Outcome: 4 (good)  Respiratory Respiratory Symptoms Reported: No symptoms reported Respiratory Self-Management Outcome: 4 (good)  Endocrine Endocrine Symptoms Reported: No symptoms reported Is patient diabetic?: No Endocrine Self-Management Outcome: 4 (good)  Gastrointestinal Gastrointestinal Symptoms Reported: No symptoms reported Gastrointestinal Self-Management Outcome: 4 (good)    Genitourinary Genitourinary Symptoms Reported: No symptoms reported Additional Genitourinary Details: still has mild urination    Integumentary Integumentary Symptoms Reported: No symptoms reported Skin Management Strategies: Routine screening Skin Self-Management Outcome: 4 (good)   Musculoskeletal Musculoskelatal Symptoms Reviewed: Limited mobility Additional Musculoskeletal Details: when bp drops he uses walker Musculoskeletal Management Strategies: Routine screening Musculoskeletal Self-Management Outcome: 4 (good) Falls in the past year?: No Number of falls in past year: 1 or less Patient at Risk for Falls Due to: No Fall Risks Fall risk Follow up: Falls prevention discussed, Education provided, Falls evaluation completed  Psychosocial Psychosocial Symptoms Reported: Intrusive thoughts or memories, Irritability, Difficulty concentrating Behavioral Management Strategies: Coping strategies Behavioral Health Self-Management Outcome: 4 (good) Major Change/Loss/Stressor/Fears (CP): Denies Techniques to Cope with Loss/Stress/Change: Counseling Quality of Family Relationships: non-existent Do you feel physically threatened by others?: No    09/07/2024    PHQ2-9 Depression Screening   Little interest or pleasure in doing things Not at all  Feeling down, depressed, or hopeless Not at all  PHQ-2 - Total Score 0  Trouble falling or staying asleep, or sleeping too much Several days  Feeling tired or having little energy Several days  Poor appetite or overeating  Not at all  Feeling bad about yourself - or that you are a failure or have let yourself or your family down Not at all  Trouble concentrating on things, such as reading the newspaper or watching television Not at all  Moving or speaking so slowly that other people could have noticed.  Or the opposite - being so fidgety or restless that you have been moving around a lot more than usual Not at all  Thoughts that you would be better off dead, or hurting yourself in some way Not at all  PHQ2-9 Total Score 2  If you checked off any problems, how difficult have these problems made it for you to do your work, take care of things at home, or get along with other people Somewhat difficult  Depression  Interventions/Treatment Medication    There were no vitals filed for this visit. Pain Scale: 0-10 Pain Score: 0-No pain Multiple Pain Sites: No  Medications  Reviewed Today     Reviewed by Nivia, Cardell Rachel , RN (Registered Nurse) on 09/07/24 at 1013  Med List Status: <None>   Medication Order Taking? Sig Documenting Provider Last Dose Status Informant  acetaminophen  (TYLENOL ) 325 MG tablet [id number] Yes Take 650 mg by mouth every 6 (six) hours as needed for mild pain (pain score 1-3). [provider]  Active   b complex vitamins capsule [id number] Yes Take 1 capsule by mouth daily. [provider]  Active   buPROPion  (WELLBUTRIN  XL) 300 MG 24 hr tablet [id number] Yes Take 1 tablet (300 mg total) by mouth daily. Vicci, Megan P, DO  Active   calcitRIOL  (ROCALTROL ) 0.25 MCG capsule [id number] Yes Take 0.25 mcg by mouth daily. [provider]  Active   cholecalciferol  (CHOLECALCIFEROL ) 25 MCG tablet [id number] Yes Take 1 tablet (1,000 Units total) by mouth daily. Jerelene Critchley, MD  Active   docusate sodium (COLACE) 100 MG capsule [id number] Yes Take 100 mg by mouth 2 (two) times daily as needed for mild constipation. [provider]  Active   meclizine  (ANTIVERT ) 25 MG tablet [id number] Yes Take 1 tablet (25 mg total) by mouth 3 (three) times daily as needed for dizziness. Johnson, Megan P, DO  Active   metoprolol  succinate (TOPROL -XL) 25 MG 24 hr tablet [id number] Yes Take 0.5 tablets (12.5 mg total) by mouth daily. Vicci, Megan P, DO  Active   sevelamer  carbonate (RENVELA ) 800 MG tablet [id number] Yes Take 1 tablet (800 mg total) by mouth 3 (three) times daily with meals. Ponnala, Shruthi, MD  Active   venlafaxine  XR (EFFEXOR -XR) 150 MG 24 hr capsule [id number] Yes Take 1 capsule (150 mg total) by mouth daily with breakfast. To be taken with 75mg  for 225mg  daily total Johnson, Megan P, DO  Active   venlafaxine  XR (EFFEXOR -XR) 75 MG 24 hr capsule [id number] Yes To be  taken with 150mg  for 225mg  daily total. Vicci, Megan P, DO  Active   Vitamin D , Ergocalciferol , (DRISDOL ) 1.25 MG (50000 UNIT) CAPS capsule [id number] Yes Take 1 capsule (50,000 Units total) by mouth every 7 (seven) days. Vicci Duwaine SQUIBB, DO  Active             Recommendation:   Continue Current Plan of Care  Follow Up Plan:   Telephone follow-up in 2 weeks  Jarvis Sawa RN RN Care Manager [facility] [phone]

## 2024-09-10 ENCOUNTER — Encounter: Payer: Self-pay | Admitting: *Deleted

## 2024-09-10 ENCOUNTER — Other Ambulatory Visit: Payer: Self-pay

## 2024-09-10 ENCOUNTER — Emergency Department

## 2024-09-10 ENCOUNTER — Ambulatory Visit: Admitting: Family Medicine

## 2024-09-10 ENCOUNTER — Emergency Department
Admission: EM | Admit: 2024-09-10 | Discharge: 2024-09-10 | Disposition: A | Discharge status: Home / Self Care | Attending: Emergency Medicine | Admitting: Emergency Medicine

## 2024-09-10 DIAGNOSIS — N186 End stage renal disease: Secondary | ICD-10-CM | POA: Diagnosis not present

## 2024-09-10 DIAGNOSIS — I120 Hypertensive chronic kidney disease with stage 5 chronic kidney disease or end stage renal disease: Secondary | ICD-10-CM | POA: Diagnosis not present

## 2024-09-10 DIAGNOSIS — R10A2 Flank pain, left side: Secondary | ICD-10-CM | POA: Diagnosis present

## 2024-09-10 DIAGNOSIS — Z992 Dependence on renal dialysis: Secondary | ICD-10-CM | POA: Diagnosis not present

## 2024-09-10 DIAGNOSIS — K8020 Calculus of gallbladder without cholecystitis without obstruction: Secondary | ICD-10-CM | POA: Insufficient documentation

## 2024-09-10 LAB — CBC
HCT: 32.9 % — ABNORMAL LOW (ref 39.0–52.0)
Hemoglobin: 10.5 g/dL — ABNORMAL LOW (ref 13.0–17.0)
MCH: 33.2 pg (ref 26.0–34.0)
MCHC: 31.9 g/dL (ref 30.0–36.0)
MCV: 104.1 fL — ABNORMAL HIGH (ref 80.0–100.0)
Platelets: 192 K/uL (ref 150–400)
RBC: 3.16 MIL/uL — ABNORMAL LOW (ref 4.22–5.81)
RDW: 17.1 % — ABNORMAL HIGH (ref 11.5–15.5)
WBC: 8.1 K/uL (ref 4.0–10.5)
nRBC: 0 % (ref 0.0–0.2)

## 2024-09-10 LAB — URINALYSIS, ROUTINE W REFLEX MICROSCOPIC
Bacteria, UA: NONE SEEN
Bilirubin Urine: NEGATIVE
Glucose, UA: 150 mg/dL — AB
Hgb urine dipstick: NEGATIVE
Ketones, ur: NEGATIVE mg/dL
Leukocytes,Ua: NEGATIVE
Nitrite: NEGATIVE
Protein, ur: 100 mg/dL — AB
Specific Gravity, Urine: 1.011 (ref 1.005–1.030)
pH: 8 (ref 5.0–8.0)

## 2024-09-10 LAB — BASIC METABOLIC PANEL WITH GFR
Anion gap: 13 (ref 5–15)
BUN: 55 mg/dL — ABNORMAL HIGH (ref 6–20)
CO2: 24 mmol/L (ref 22–32)
Calcium: 9 mg/dL (ref 8.9–10.3)
Chloride: 104 mmol/L (ref 98–111)
Creatinine, Ser: 7.01 mg/dL — ABNORMAL HIGH (ref 0.61–1.24)
GFR, Estimated: 8 mL/min — ABNORMAL LOW
Glucose, Bld: 115 mg/dL — ABNORMAL HIGH (ref 70–99)
Potassium: 4.5 mmol/L (ref 3.5–5.1)
Sodium: 141 mmol/L (ref 135–145)

## 2024-09-10 MED ORDER — ACETAMINOPHEN 500 MG PO TABS
1000.0000 mg | ORAL_TABLET | Freq: Once | ORAL | Status: AC
  Administered 2024-09-10: 1000 mg via ORAL
  Filled 2024-09-10: qty 2

## 2024-09-10 MED ORDER — LIDOCAINE 5 % EX PTCH
1.0000 | MEDICATED_PATCH | CUTANEOUS | Status: DC
  Administered 2024-09-10: 1 via TRANSDERMAL
  Filled 2024-09-10: qty 1

## 2024-09-10 MED ORDER — LIDOCAINE 5 % EX PTCH: 1.0000 | MEDICATED_PATCH | CUTANEOUS | 0 refills | Status: AC

## 2024-09-10 NOTE — ED Notes (Signed)
 AVS provided by edp was reviewed with the pt. Pt verbalized understanding with no additional questions at this time. Pt verified pharmacy.

## 2024-09-10 NOTE — ED Provider Notes (Signed)
 [facility] Provider Note    Event Date/Time   First MD Initiated Contact with Patient 09/10/24 1923     (approximate)   History   Flank Pain   HPI  Brady Edwards is a 59 y.o. male with history of hypertension, hyperlipidemia, ESRD on dialysis, presenting with flank pain.  Has been ongoing for 3 days.  No nausea vomiting or diarrhea, no history of kidney stones.  He denies any urinary symptoms.  No recent trauma or falls.  No rash overlying the area.  No abdominal pain.  States he goes to dialysis Tuesday Thursday Saturday, last dialysis session was on Saturday.  On independent review, he was seen by cardiology in early January for atrial fibrillation, there was some question about whether or not he does indeed have atrial fibrillation since his symptoms are more consistent with PACs and PVCs.  They discussed loop recorder or home monitoring that patient deferred, given that his chads vasc score is 1, they deferred anticoagulation.  He was continue on his metoprolol .  Had a prior CT scan done in August of last year, no aneurysm was noted, did show simple cysts in both kidneys without any hydronephrosis or kidney stones.     Physical Exam   Triage Vital Signs: ED Triage Vitals  Encounter Vitals Group     BP 09/10/24 1652 (!) 149/79     Girls Systolic BP Percentile --      Girls Diastolic BP Percentile --      Boys Systolic BP Percentile --      Boys Diastolic BP Percentile --      Pulse Rate 09/10/24 1652 85     Resp 09/10/24 1652 18     Temp 09/10/24 1652 98.5 F (36.9 C)     Temp Source 09/10/24 1652 Oral     SpO2 09/10/24 1652 96 %     Weight 09/10/24 1651 280 lb (127 kg)     Height 09/10/24 1651 5' 9 (1.753 m)     Head Circumference --      Peak Flow --      Pain Score 09/10/24 1650 7     Pain Loc --      Pain Education --      Exclude from Growth Chart --     Most recent vital signs: Vitals:   09/10/24 1652 09/10/24 2109  BP: (!)  149/79 (!) 149/82  Pulse: 85 69  Resp: 18 20  Temp: 98.5 F (36.9 C) 98.1 F (36.7 C)  SpO2: 96% 100%     General: Awake, no distress.  CV:  Good peripheral perfusion.  Resp:  Normal effort.  Abd:  No distention.  Soft nontender Other:  No overlying rash, he does have tenderness to his left upper flank.  No CVA tenderness bilaterally.  No swelling, erythema, fluctuance, induration.   ED Results / Procedures / Treatments   Labs (all labs ordered are listed, but only abnormal results are displayed) Labs Reviewed  URINALYSIS, ROUTINE W REFLEX MICROSCOPIC - Abnormal; Notable for the following components:      Result Value   Color, Urine YELLOW (*)    APPearance CLEAR (*)    Glucose, UA 150 (*)    Protein, ur 100 (*)    All other components within normal limits  BASIC METABOLIC PANEL WITH GFR - Abnormal; Notable for the following components:   Glucose, Bld 115 (*)    BUN 55 (*)    Creatinine,  Ser 7.01 (*)    GFR, Estimated 8 (*)    All other components within normal limits  CBC - Abnormal; Notable for the following components:   RBC 3.16 (*)    Hemoglobin 10.5 (*)    HCT 32.9 (*)    MCV 104.1 (*)    RDW 17.1 (*)    All other components within normal limits      RADIOLOGY On my independent interpretation, CT without obvious hydronephrosis   PROCEDURES:  Critical Care performed: No  Procedures   MEDICATIONS ORDERED IN ED: Medications  lidocaine  (LIDODERM ) 5 % 1 patch (1 patch Transdermal Patch Applied 09/10/24 2103)  acetaminophen  (TYLENOL ) tablet 1,000 mg (1,000 mg Oral Given 09/10/24 2102)     IMPRESSION / MDM / ASSESSMENT AND PLAN / ED COURSE  I reviewed the triage vital signs and the nursing notes.                              Differential diagnosis includes, but is not limited to, nephrolithiasis, musculoskeletal pain, strain, no rash to suggest shingles at this time.  Labs were obtained out of triage, UA was obtained out of triage, will add a CT  abdomen pelvis, Lidoderm  patch, Tylenol .  Patient's presentation is most consistent with acute presentation with potential threat to life or bodily function.  Independent interpretation of labs and imaging below.  On reassessment patient's pain controlled.  States that he might be having the pain since he has been sleeping on that side on the floor.  States that he has a bed now and will sleep there.  Did discuss with him about imaging lab results, will give a number to call for surgery to follow-up outpatient for his gallstones.  Also give him some Lidoderm  patches that he can use as needed for pain.  He can also take Tylenol  as needed for pain.  Otherwise considered but no indication for inpatient admission at this time, he is safe for outpatient management.  Will discharge with strict return precautions.    Clinical Course as of 09/10/24 2117  Mon Sep 10, 2024  8069 Independent review of labs, UA is not consistent with UTI, no hematuria, electrolytes severely deranged, creatinine is elevated but he has history of ESRD on dialysis, no leukocytosis.  H&H is stable. [TT]  2100 CT ABDOMEN PELVIS WO CONTRAST IMPRESSION: 1. No hydronephrosis or obstructing urinary calculus. 2. Small gallstone without complicating factors.   [TT]    Clinical Course User Index [TT] Waymond Lorelle Cummins, MD     FINAL CLINICAL IMPRESSION(S) / ED DIAGNOSES   Final diagnoses:  Left flank pain  Gallstones     Rx / DC Orders   ED Discharge Orders          Ordered    lidocaine  (LIDODERM ) 5 %  Every 24 hours        09/10/24 2114             Note:  This document was prepared using Dragon voice recognition software and may include unintentional dictation errors.    Waymond Lorelle Cummins, MD 09/10/24 2117

## 2024-09-10 NOTE — Discharge Instructions (Addendum)
 Needed Tylenol  as needed for pain.  Please make sure to watch out for any rash.  If you do see a rash develop, please return or see your primary care doctor.  Your CT incidentally noted gallstones, no intervention needed in emergency department but I will give you a number to call for surgery to follow-up.

## 2024-09-10 NOTE — ED Triage Notes (Signed)
 Pt ambulatory to triage.  Dialysis pt.  Pt has left flank pain for 3 days.  No on/v/d   no hx kidney stones  pt alert.

## 2024-09-12 ENCOUNTER — Other Ambulatory Visit: Payer: Self-pay | Admitting: *Deleted

## 2024-09-12 NOTE — Patient Instructions (Signed)
 Visit Information  Thank you for taking time to visit with me today. Please don't hesitate to contact me if I can be of assistance to you before our next scheduled appointment.  Your next care management appointment is no further scheduled appointments.   Telephone follow up appointment date/time:  [facility] 09/21/24  Please call the care guide team at [phone] if you need to cancel, schedule, or reschedule an appointment.   Please call the Suicide and Crisis Lifeline: 988 call the [place]  National Suicide Prevention Lifeline: 858 442 7405 or TTY: 406-752-5215 TTY (918) 072-4709) to talk to a trained counselor call 1-800-273-TALK (toll free, 24 hour hotline) call 911 if you are experiencing a Mental Health or Behavioral Health Crisis or need someone to talk to.  Stefon Ramthun, LCSW [place]  [facility], [facility] Licensed Clinical Social Worker  Direct Dial: [phone]

## 2024-09-12 NOTE — Patient Outreach (Signed)
 Complex Care Management   Visit Note  09/12/2024  Name:  Brady Edwards MRN: [id number] DOB: June 25, 1966  Situation: Referral received for Complex Care Management related to Mental/Behavioral Health diagnosis anxiety and housing challenges I obtained verbal consent from Patient.  Visit completed with Patient  on the phone    Background:   Past Medical History:  Diagnosis Date   Atrial fibrillation (HCC)    Clotted dialysis access 05/10/2024   Depression    Hypertension     Assessment: Patient reports  home situation has improved-confirms having no additional community resource needs at this time Patient Reported Symptoms:  Cognitive Cognitive Status: Alert and oriented to person, place, and time Cognitive/Intellectual Conditions Management [RPT]: None reported or documented in medical history or problem list   Health Maintenance Behaviors: Annual physical exam Healing Pattern: Average Health Facilitated by: Pain control, Rest  Neurological Neurological Review of Symptoms: Dizziness Neurological Management Strategies: Routine screening Neurological Comment: dizziness improving  HEENT HEENT Symptoms Reported: No symptoms reported      Cardiovascular Cardiovascular Symptoms Reported: No symptoms reported, Dizziness Does patient have uncontrolled Hypertension?: Yes Is patient checking Blood Pressure at home?: Yes Patient's Recent BP reading at home: saw cardiologistm-last Wednesday Dr. Mady  143/83    Respiratory Respiratory Symptoms Reported: No symptoms reported    Endocrine Endocrine Symptoms Reported: No symptoms reported    Gastrointestinal Gastrointestinal Symptoms Reported: No symptoms reported      Genitourinary Genitourinary Symptoms Reported: No symptoms reported Genitourinary Management Strategies: Hemodialysis Hemodialysis Schedule: 3 days per week, will be changing dialysis centers closer to his current home Hemodialysis Last Treatment: 09/08/24 Genitourinary  Self-Management Outcome: 4 (good)  Integumentary Integumentary Symptoms Reported: No symptoms reported    Musculoskeletal Musculoskelatal Symptoms Reviewed: Limited mobility Additional Musculoskeletal Details: hurting on left side, uses walker Musculoskeletal Management Strategies: Routine screening Musculoskeletal Self-Management Outcome: 4 (good)      Psychosocial Psychosocial Symptoms Reported: No symptoms reported Additional Psychological Details: -Anxiety symptoms has improved, now that housing environment has changed Behavioral Management Strategies: Coping strategies        09/12/2024    PHQ2-9 Depression Screening   Little interest or pleasure in doing things Not at all  Feeling down, depressed, or hopeless Not at all  PHQ-2 - Total Score 0  Trouble falling or staying asleep, or sleeping too much    Feeling tired or having little energy    Poor appetite or overeating     Feeling bad about yourself - or that you are a failure or have let yourself or your family down    Trouble concentrating on things, such as reading the newspaper or watching television    Moving or speaking so slowly that other people could have noticed.  Or the opposite - being so fidgety or restless that you have been moving around a lot more than usual    Thoughts that you would be better off dead, or hurting yourself in some way    PHQ2-9 Total Score    If you checked off any problems, how difficult have these problems made it for you to do your work, take care of things at home, or get along with other people    Depression Interventions/Treatment      There were no vitals filed for this visit.    Medications Reviewed Today     Reviewed by Ermalinda Lenn HERO, LCSW (Social Worker) on 09/12/24 at 1021  Med List Status: <None>   Medication Order Taking? Sig Documenting  Provider Last Dose Status Informant  acetaminophen  (TYLENOL ) 325 MG tablet [id number] Yes Take 650 mg by mouth every 6 (six) hours as  needed for mild pain (pain score 1-3). [provider]  Active   b complex vitamins capsule [id number] Yes Take 1 capsule by mouth daily. [provider]  Active   buPROPion  (WELLBUTRIN  XL) 300 MG 24 hr tablet [id number] Yes Take 1 tablet (300 mg total) by mouth daily. Johnson, Megan P, DO  Active   calcitRIOL  (ROCALTROL ) 0.25 MCG capsule [id number] Yes Take 0.25 mcg by mouth daily. [provider]  Active   cholecalciferol  (CHOLECALCIFEROL ) 25 MCG tablet [id number] Yes Take 1 tablet (1,000 Units total) by mouth daily. Jerelene Critchley, MD  Active   docusate sodium (COLACE) 100 MG capsule [id number] Yes Take 100 mg by mouth 2 (two) times daily as needed for mild constipation. [provider]  Active   lidocaine  (LIDODERM ) 5 % [id number] Yes Place 1 patch onto the skin daily. Remove & Discard patch within 12 hours or as directed by MD Waymond, Lorelle Cummins, MD  Active   meclizine  (ANTIVERT ) 25 MG tablet [id number] Yes Take 1 tablet (25 mg total) by mouth 3 (three) times daily as needed for dizziness. Johnson, Megan P, DO  Active   metoprolol  succinate (TOPROL -XL) 25 MG 24 hr tablet [id number] Yes Take 0.5 tablets (12.5 mg total) by mouth daily. Vicci, Megan P, DO  Active   sevelamer  carbonate (RENVELA ) 800 MG tablet [id number] Yes Take 1 tablet (800 mg total) by mouth 3 (three) times daily with meals. Ponnala, Shruthi, MD  Active   venlafaxine  XR (EFFEXOR -XR) 150 MG 24 hr capsule [id number] Yes Take 1 capsule (150 mg total) by mouth daily with breakfast. To be taken with 75mg  for 225mg  daily total Johnson, Megan P, DO  Active   venlafaxine  XR (EFFEXOR -XR) 75 MG 24 hr capsule [id number] Yes To be taken with 150mg  for 225mg  daily total. Vicci, Megan P, DO  Active   Vitamin D , Ergocalciferol , (DRISDOL ) 1.25 MG (50000 UNIT) CAPS capsule [id number] Yes Take 1 capsule (50,000 Units total) by mouth every 7 (seven) days. Vicci Duwaine SQUIBB, DO  Active             Recommendation:   PCP  Follow-up Specialty provider follow-up as scheduled  Follow Up Plan:   Closing From:  [facility] social work  [employer], [employer] [facility]  [employer], [employer] [facility] Licensed Clinical Social Worker  Direct Dial: [phone]

## 2024-09-20 ENCOUNTER — Encounter: Payer: Self-pay | Admitting: Oncology

## 2024-09-20 ENCOUNTER — Inpatient Hospital Stay: Admitting: Oncology

## 2024-09-20 NOTE — Progress Notes (Unsigned)
 Patient has no new or acute concerns at this time.

## 2024-09-21 ENCOUNTER — Other Ambulatory Visit: Payer: Self-pay

## 2024-09-21 NOTE — Patient Instructions (Signed)
 Brady Edwards - I am sorry I was unable to reach you today for our scheduled appointment. I work with Vicci Duwaine SQUIBB, DO and am calling to support your healthcare needs. Please contact me at [phone] at your earliest convenience. I look forward to speaking with you soon.   Thank you,  Jaquelynn Wanamaker  Administrator, Sports [employer] [phone]

## 2024-09-26 ENCOUNTER — Encounter: Admitting: Family Medicine

## 2024-09-28 ENCOUNTER — Other Ambulatory Visit: Payer: Self-pay

## 2024-09-28 NOTE — Patient Outreach (Signed)
 Complex Care Management   Visit Note  09/28/2024  Name:  Brady Edwards MRN: [id number] DOB: [birth year]  Situation: Referral received for Complex Care Management related to ESRD I obtained verbal consent from Patient.  Visit completed with Patient  on the phone  Background:   Past Medical History:  Diagnosis Date   Atrial fibrillation (HCC)    Clotted dialysis access 05/10/2024   Depression    Hypertension     Assessment: Patient Reported Symptoms:  Cognitive Cognitive Status: Alert and oriented to person, place, and time   Health Maintenance Behaviors: Annual physical exam Healing Pattern: Average  Neurological Neurological Review of Symptoms: Dizziness Neurological Management Strategies: Routine screening Neurological Self-Management Outcome: 4 (good)  HEENT HEENT Symptoms Reported: No symptoms reported HEENT Management Strategies: Routine screening HEENT Self-Management Outcome: 4 (good)    Cardiovascular Cardiovascular Symptoms Reported: No symptoms reported Does patient have uncontrolled Hypertension?: Yes Is patient checking Blood Pressure at home?: Yes Cardiovascular Management Strategies: Routine screening Weight: 293 lb (132.9 kg) Cardiovascular Self-Management Outcome: 4 (good)  Respiratory Respiratory Symptoms Reported: No symptoms reported Respiratory Management Strategies: Routine screening Respiratory Self-Management Outcome: 4 (good)  Endocrine Endocrine Symptoms Reported: No symptoms reported Is patient diabetic?: No Endocrine Self-Management Outcome: 4 (good)  Gastrointestinal Gastrointestinal Symptoms Reported: No symptoms reported Gastrointestinal Management Strategies: Adequate rest Gastrointestinal Self-Management Outcome: 4 (good)    Genitourinary Genitourinary Symptoms Reported: No symptoms reported Genitourinary Management Strategies: Hemodialysis Hemodialysis Schedule: moved to [facility] Hemodialysis Last Treatment:  09/25/24 Genitourinary Self-Management Outcome: 4 (good)  Integumentary Integumentary Symptoms Reported: No symptoms reported Skin Management Strategies: Routine screening Skin Self-Management Outcome: 4 (good)  Musculoskeletal Musculoskelatal Symptoms Reviewed: Limited mobility Additional Musculoskeletal Details: not using Musculoskeletal Management Strategies: Routine screening Musculoskeletal Self-Management Outcome: 4 (good) Falls in the past year?: No Number of falls in past year: 1 or less Fall risk Follow up: Education provided, Falls prevention discussed, Falls evaluation completed  Psychosocial Psychosocial Symptoms Reported: No symptoms reported Behavioral Management Strategies: Coping strategies Behavioral Health Self-Management Outcome: 4 (good) Major Change/Loss/Stressor/Fears (CP): Denies Techniques to Cope with Loss/Stress/Change: Counseling Do you feel physically threatened by others?: No    09/28/2024    PHQ2-9 Depression Screening   Little interest or pleasure in doing things Not at all  Feeling down, depressed, or hopeless Not at all  PHQ-2 - Total Score 0  Trouble falling or staying asleep, or sleeping too much Not at all  Feeling tired or having little energy Not at all  Poor appetite or overeating  Not at all  Feeling bad about yourself - or that you are a failure or have let yourself or your family down Not at all  Trouble concentrating on things, such as reading the newspaper or watching television Not at all  Moving or speaking so slowly that other people could have noticed.  Or the opposite - being so fidgety or restless that you have been moving around a lot more than usual Not at all  Thoughts that you would be better off dead, or hurting yourself in some way Not at all  PHQ2-9 Total Score 0  If you checked off any problems, how difficult have these problems made it for you to do your work, take care of things at home, or get along with other people Not  difficult at all  Depression Interventions/Treatment Medication    Today's Vitals   09/28/24 0941  Weight: 293 lb (132.9 kg)   Pain Scale: 0-10 Pain Score: 0-No pain Pain Type:  Acute pain  Medications Reviewed Today     Reviewed by Aeralyn Barna , RN (Registered Nurse) on 09/28/24 at 0940  Med List Status: <None>   Medication Order Taking? Sig Documenting Provider Last Dose Status Informant  acetaminophen  (TYLENOL ) 325 MG tablet [id number] Yes Take 650 mg by mouth every 6 (six) hours as needed for mild pain (pain score 1-3). [provider]  Active   b complex vitamins capsule [id number] Yes Take 1 capsule by mouth daily. [provider]  Active   buPROPion  (WELLBUTRIN  XL) 300 MG 24 hr tablet [id number] Yes Take 1 tablet (300 mg total) by mouth daily. Vicci, Megan P, DO  Active   calcitRIOL  (ROCALTROL ) 0.25 MCG capsule [id number] Yes Take 0.25 mcg by mouth daily. [provider]  Active   cholecalciferol  (CHOLECALCIFEROL ) 25 MCG tablet [id number] Yes Take 1 tablet (1,000 Units total) by mouth daily. Jerelene Critchley, MD  Active   docusate sodium (COLACE) 100 MG capsule [id number] Yes Take 100 mg by mouth 2 (two) times daily as needed for mild constipation. [provider]  Active   lidocaine  (LIDODERM ) 5 % [id number] Yes Place 1 patch onto the skin daily. Remove & Discard patch within 12 hours or as directed by MD Waymond, Lorelle Cummins, MD  Active   meclizine  (ANTIVERT ) 25 MG tablet [id number] Yes Take 1 tablet (25 mg total) by mouth 3 (three) times daily as needed for dizziness. Johnson, Megan P, DO  Active   Meclizine  HCl 25 MG CHEW [id number] Yes Chew 1 tablet by mouth 3 (three) times daily. [provider]  Active   metoprolol  succinate (TOPROL -XL) 25 MG 24 hr tablet [id number] Yes Take 0.5 tablets (12.5 mg total) by mouth daily. Vicci, Megan P, DO  Active   sevelamer  carbonate (RENVELA ) 800 MG tablet [id number] Yes Take 1 tablet (800 mg total) by mouth 3  (three) times daily with meals. Ponnala, Shruthi, MD  Active   venlafaxine  XR (EFFEXOR -XR) 150 MG 24 hr capsule [id number] Yes Take 1 capsule (150 mg total) by mouth daily with breakfast. To be taken with 75mg  for 225mg  daily total Johnson, Megan P, DO  Active   venlafaxine  XR (EFFEXOR -XR) 75 MG 24 hr capsule [id number] Yes To be taken with 150mg  for 225mg  daily total. Vicci, Megan P, DO  Active   Vitamin D , Ergocalciferol , (DRISDOL ) 1.25 MG (50000 UNIT) CAPS capsule [id number] Yes Take 1 capsule (50,000 Units total) by mouth every 7 (seven) days. Vicci Duwaine SQUIBB, DO  Active             Recommendation:   Continue Current Plan of Care  Follow Up Plan:   Telephone follow-up in 1 month  Konner Warrior RN RN Care Manager [employer] [phone]

## 2024-09-28 NOTE — Patient Instructions (Signed)
 blankblankblankVisit Information  Thank you for taking time to visit with me today. Please don't hesitate to contact me if I can be of assistance to you before our next scheduled appointment.  Your next care management appointment is by telephone on 09/25/24 at 930am  Telephone follow-up in 1 month  Please call the care guide team at [phone] if you need to cancel, schedule, or reschedule an appointment.   Please call the Suicide and Crisis Lifeline: 988 call the [place]  National Suicide Prevention Lifeline: 947-794-1501 or TTY: 787-127-1509 TTY 724-348-3035) to talk to a trained counselor call 1-800-273-TALK (toll free, 24 hour hotline) if you are experiencing a Mental Health or Behavioral Health Crisis or need someone to talk to.  Arsenia Goracke RN RN Care Manager [facility] [phone]

## 2024-10-22 ENCOUNTER — Encounter (INDEPENDENT_AMBULATORY_CARE_PROVIDER_SITE_OTHER)

## 2024-10-22 ENCOUNTER — Other Ambulatory Visit (INDEPENDENT_AMBULATORY_CARE_PROVIDER_SITE_OTHER)

## 2024-10-22 ENCOUNTER — Encounter (INDEPENDENT_AMBULATORY_CARE_PROVIDER_SITE_OTHER): Admitting: Nurse Practitioner

## 2024-10-26 ENCOUNTER — Telehealth

## 2025-02-20 ENCOUNTER — Ambulatory Visit: Admitting: Student
